# Patient Record
Sex: Male | Born: 1970 | Race: White | Hispanic: No | Marital: Single | Smoking: Current some day smoker
Health system: Southern US, Community
[De-identification: ages and names within clinical notes are randomized; demographics above are authoritative.]

## PROBLEM LIST (undated history)

## (undated) DIAGNOSIS — R569 Unspecified convulsions: Secondary | ICD-10-CM

## (undated) HISTORY — PX: SKIN GRAFT: SHX250

---

## 2004-10-24 ENCOUNTER — Emergency Department: Payer: Self-pay | Admitting: Emergency Medicine

## 2009-11-11 ENCOUNTER — Emergency Department: Payer: Self-pay | Admitting: Emergency Medicine

## 2011-03-29 ENCOUNTER — Emergency Department: Payer: Self-pay | Admitting: Emergency Medicine

## 2011-09-22 ENCOUNTER — Emergency Department: Payer: Self-pay | Admitting: *Deleted

## 2015-10-27 ENCOUNTER — Emergency Department
Admission: EM | Admit: 2015-10-27 | Discharge: 2015-10-28 | Disposition: A | Discharge status: Home / Self Care | Payer: Managed Care, Other (non HMO) | Attending: Emergency Medicine | Admitting: Emergency Medicine

## 2015-10-27 DIAGNOSIS — Y9389 Activity, other specified: Secondary | ICD-10-CM | POA: Diagnosis not present

## 2015-10-27 DIAGNOSIS — R000 Tachycardia, unspecified: Secondary | ICD-10-CM | POA: Diagnosis not present

## 2015-10-27 DIAGNOSIS — Z79899 Other long term (current) drug therapy: Secondary | ICD-10-CM | POA: Insufficient documentation

## 2015-10-27 DIAGNOSIS — R569 Unspecified convulsions: Secondary | ICD-10-CM

## 2015-10-27 DIAGNOSIS — S60812A Abrasion of left wrist, initial encounter: Secondary | ICD-10-CM | POA: Insufficient documentation

## 2015-10-27 DIAGNOSIS — X58XXXA Exposure to other specified factors, initial encounter: Secondary | ICD-10-CM | POA: Insufficient documentation

## 2015-10-27 DIAGNOSIS — G40909 Epilepsy, unspecified, not intractable, without status epilepticus: Secondary | ICD-10-CM | POA: Diagnosis not present

## 2015-10-27 DIAGNOSIS — Y9289 Other specified places as the place of occurrence of the external cause: Secondary | ICD-10-CM | POA: Insufficient documentation

## 2015-10-27 DIAGNOSIS — Y990 Civilian activity done for income or pay: Secondary | ICD-10-CM | POA: Insufficient documentation

## 2015-10-27 HISTORY — DX: Unspecified convulsions: R56.9

## 2015-10-27 LAB — CBC
HEMATOCRIT: 56.8 % — AB (ref 40.0–52.0)
HEMOGLOBIN: 18.9 g/dL — AB (ref 13.0–18.0)
MCH: 33.2 pg (ref 26.0–34.0)
MCHC: 33.2 g/dL (ref 32.0–36.0)
MCV: 99.7 fL (ref 80.0–100.0)
Platelets: 445 10*3/uL — ABNORMAL HIGH (ref 150–440)
RBC: 5.69 MIL/uL (ref 4.40–5.90)
RDW: 16.4 % — ABNORMAL HIGH (ref 11.5–14.5)
WBC: 9.5 10*3/uL (ref 3.8–10.6)

## 2015-10-27 MED ORDER — SODIUM CHLORIDE 0.9 % IV BOLUS (SEPSIS)
1000.0000 mL | Freq: Once | INTRAVENOUS | Status: AC
  Administered 2015-10-27: 1000 mL via INTRAVENOUS

## 2015-10-27 MED ORDER — SODIUM CHLORIDE 0.9 % IV BOLUS (SEPSIS)
1000.0000 mL | INTRAVENOUS | Status: AC
  Administered 2015-10-27: 1000 mL via INTRAVENOUS

## 2015-10-27 NOTE — ED Notes (Addendum)
Pt reports hx of seizures. Pt currently takes Lamictal and Depakote for seizures. Pt took his med's this am. Pt had witnessed seizure at work at approx 2140. Pt did not hit floor, but has bruising to left wrist. EMS reports he may have hit wrist on desk during seizure. Pt reports having seizure 2 weeks ago. Pt did not have med's adjusted after last seizure. Pt denies pain, weakness, lightheadedness, dizziness. Pt is A&O X 4. Pt is currently in ST at 141, with BP of 183/107

## 2015-10-27 NOTE — ED Provider Notes (Signed)
[facility] Emergency Department Provider Note  ____________________________________________  Time seen: Approximately 11:28 PM  I have reviewed the triage vital signs and the nursing notes.   HISTORY  Chief Complaint Seizures    HPI Lance Ray is a 45 y.o. male with a known seizure disorder who is followed by [facility] neurology who presents by EMS after a witnessed seizure while he was at work.  He works as a Industrial/product designer and reports that he was at work and the next thing he knew he was on the ambulance.  Bystanders report that he had a generalized tonic-clonic seizure.  The duration is unknown but he stopped seizing on his own.  He struck his left wrist somehow during the seizure but he did not fall, did not strike his head, did not sustain any other injuries.  Her port is that he was initially confused afterwards but he is back to his baseline at this time.  When he first came in his heart rate was approximately 150 but after 1 L of fluids it is dropped down to the 110s.  The patient denies any numbness or weakness in any of his extremities and states that he feels fine.  He has not had any recent illnesses.  He denies headache, visual changes, neck pain, nausea, vomiting, diarrhea, abdominal pain, chest pain, shortness of breath.  He has not had any recent viral symptoms specifically excluding fever/chills.  He has not had any medication changes recently and takes both Lamictal and Depakote.  He states that he has been sleeping adequately, has not been drinking alcohol, has not been using any illegal drugs, and has been compliant with his medications.  He also states that he feels he has been eating and drinking adequately and does not feel dehydrated.   Past Medical History  Diagnosis Date  . Seizures (HCC)     There are no active problems to display for this patient.   History reviewed. No pertinent past surgical history.  Current Outpatient Rx  Name   Route  Sig  Dispense  Refill  . divalproex (DEPAKOTE ER) 500 MG 24 hr tablet   Oral   Take 500 mg by mouth daily.         [name] lamoTRIgine (LAMICTAL) 200 MG tablet   Oral   Take 200 mg by mouth 2 (two) times daily.           Allergies Review of patient's allergies indicates no known allergies.  Family History  Problem Relation Age of Onset  . Seizures Mother     Social History Social History  Substance Use Topics  . Smoking status: Never Smoker   . Smokeless tobacco: None  . Alcohol Use: None    Review of Systems Constitutional: No fever/chills Eyes: No visual changes. ENT: No sore throat. Cardiovascular: Denies chest pain. Respiratory: Denies shortness of breath. Gastrointestinal: No abdominal pain.  No nausea, no vomiting.  No diarrhea.  No constipation. Genitourinary: Negative for dysuria. Musculoskeletal: Negative for back pain. Skin: Negative for rash. Neurological: Negative for headaches, focal weakness or numbness.  Generalized tonic-clonic seizure at work just prior to arrival  10-point ROS otherwise negative.  ____________________________________________   PHYSICAL EXAM:  VITAL SIGNS: ED Triage Vitals  Enc Vitals Group     BP 10/27/15 2245 159/91 mmHg     Pulse Rate 10/27/15 2245 137     Resp 10/27/15 2245 17     Temp 10/27/15 2245 98 F (36.7 C)  Temp Source 10/27/15 2245 Oral     SpO2 10/27/15 2235 96 %     Weight 10/27/15 2245 150 lb (68.04 kg)     Height 10/27/15 2245  (1.753 m)     Head Cir --      Peak Flow --      Pain Score --      Pain Loc --      Pain Edu? --      Excl. in GC? --     Constitutional: Alert and oriented. Well appearing and in no acute distress. Eyes: Conjunctivae are normal. PERRL. EOMI. Head: Atraumatic. Nose: No congestion/rhinnorhea. Mouth/Throat: Mucous membranes are moist.  Oropharynx non-erythematous. Neck: No stridor.  No cervical spine tenderness to palpation. Cardiovascular: Tachycardia,  regular rhythm. Grossly normal heart sounds.  Good peripheral circulation. Respiratory: Normal respiratory effort.  No retractions. Lungs CTAB. Gastrointestinal: Soft and nontender. No distention. No abdominal bruits. No CVA tenderness. Musculoskeletal: No lower extremity tenderness nor edema.  No joint effusions.  Minimal tenderness to palpation on the dorsal aspect of the left wrist with some superficial abrasions but no swelling or deformity.  Normal range of motion.  No bony tenderness to palpation. Neurologic:  Normal speech and language. No gross focal neurologic deficits are appreciated.  Skin:  Skin is warm, dry and intact. No rash noted.  Abrasions on the dorsal aspect of the left wrist as described above Psychiatric: Mood and affect are normal. Speech and behavior are normal.  ____________________________________________   LABS (all labs ordered are listed, but only abnormal results are displayed)  Labs Reviewed  BASIC METABOLIC PANEL - Abnormal; Notable for the following:    Potassium 3.0 (*)    CO2 17 (*)    Glucose, Bld 146 (*)    Anion gap 22 (*)    All other components within normal limits  VALPROIC ACID LEVEL - Abnormal; Notable for the following:    Valproic Acid Lvl 103 (*)    All other components within normal limits  CBC - Abnormal; Notable for the following:    Hemoglobin 18.9 (*)    HCT 56.8 (*)    RDW 16.4 (*)    Platelets 445 (*)    All other components within normal limits  URINALYSIS COMPLETEWITH MICROSCOPIC ([facility] ONLY) - Abnormal; Notable for the following:    Color, Urine YELLOW (*)    APPearance CLEAR (*)    Protein, ur 100 (*)    Bacteria, UA MANY (*)    All other components within normal limits  URINE CULTURE  MAGNESIUM   ____________________________________________  EKG  ED ECG REPORT I, Tela Kotecki, the attending physician, personally viewed and interpreted this ECG.  Date: 10/27/2015 EKG Time: 22:38 Rate: 149 Rhythm: Sinus  tachycardia QRS Axis: normal Intervals: normal ST/T Wave abnormalities: Non-specific ST segment / T-wave changes, but no evidence of acute ischemia. Conduction Disturbances: none Narrative Interpretation: unremarkable  ____________________________________________  RADIOLOGY   No results found.  ____________________________________________   PROCEDURES  Procedure(s) performed: None  Critical Care performed: No ____________________________________________   INITIAL IMPRESSION / ASSESSMENT AND PLAN / ED COURSE  Pertinent labs & imaging results that were available during my care of the patient were reviewed by me and considered in my medical decision making (see chart for details).  The patient has a known seizure disorder with no focal neurological findings and no headache.  Imaging is unlikely to be illustrative.  I will check blood work for gross abnormalities including electrolyte imbalance, we  will check urinalysis for possible infection, and we will check a Depakote level.  Do not believe a chest x-ray would be helpful given that he has no respiratory symptoms and a normal exam on auscultation.  After 1 L of fluids his heart rate dropped from the upper 140s down to the 110s.  I will give a second liter bolus and continue to monitor.  The patient understands and agrees with this plan.  ----------------------------------------- 4:03 AM on 10/28/2015 (Note that documentation was delayed due to multiple ED patients requiring immediate care.) -----------------------------------------  The patient's workup was unremarkable.  He was somewhat hemoconcentrated which is also consistent with his initial tachycardia and normal heart rate after 2 L of fluid.  He insisted on being discharged prior to his urinalysis results, which showed many bacteria but no WBCs.  I have added on a urine culture so that if he needs antibiotics he will be contacted.  Called and spoke by phone with Dr. Ephriam Jenkins  (oncall neurologist at [facility]) and we discussed the case.  She recommended that I increase the patient's Depakote to 1000 mg daily, but that was before we got his Depakote level back which actually showed a slightly supratherapeutic level of 103.  I had given him an extra dose as per her recommendation in the emergency department, but I advised him to not continue taking the increased dose because his regular neurologist, Dr. Quintin Alto, we will contact him within the next 24 hours to schedule a follow-up appointment, and since the level as already supratherapeutic I do not want to further increase it.  I gave him my usual and customary management advice and return precautions, and he understands and agrees with the plan.   ____________________________________________  FINAL CLINICAL IMPRESSION(S) / ED DIAGNOSES  Final diagnoses:  Seizure [facility])      NEW MEDICATIONS STARTED DURING THIS VISIT:  Discharge Medication List as of 10/28/2015  1:21 AM       Loleta Rose, MD 10/28/15 1191

## 2015-10-28 LAB — URINALYSIS COMPLETE WITH MICROSCOPIC (ARMC ONLY)
Bilirubin Urine: NEGATIVE
GLUCOSE, UA: NEGATIVE mg/dL
Hgb urine dipstick: NEGATIVE
Ketones, ur: NEGATIVE mg/dL
Leukocytes, UA: NEGATIVE
Nitrite: NEGATIVE
PROTEIN: 100 mg/dL — AB
SQUAMOUS EPITHELIAL / LPF: NONE SEEN
Specific Gravity, Urine: 1.015 (ref 1.005–1.030)
pH: 6 (ref 5.0–8.0)

## 2015-10-28 LAB — VALPROIC ACID LEVEL: VALPROIC ACID LVL: 103 ug/mL — AB (ref 50.0–100.0)

## 2015-10-28 LAB — BASIC METABOLIC PANEL
Anion gap: 22 — ABNORMAL HIGH (ref 5–15)
BUN: 8 mg/dL (ref 6–20)
CALCIUM: 9.8 mg/dL (ref 8.9–10.3)
CO2: 17 mmol/L — ABNORMAL LOW (ref 22–32)
CREATININE: 1.11 mg/dL (ref 0.61–1.24)
Chloride: 101 mmol/L (ref 101–111)
GFR calc Af Amer: >60 mL/min (ref >60)
GLUCOSE: 146 mg/dL — AB (ref 65–99)
Potassium: 3 mmol/L — ABNORMAL LOW (ref 3.5–5.1)
SODIUM: 140 mmol/L (ref 135–145)

## 2015-10-28 LAB — MAGNESIUM: MAGNESIUM: 1.9 mg/dL (ref 1.7–2.4)

## 2015-10-28 MED ORDER — POTASSIUM CHLORIDE CRYS ER 20 MEQ PO TBCR
40.0000 meq | EXTENDED_RELEASE_TABLET | Freq: Once | ORAL | Status: AC
  Administered 2015-10-28: 40 meq via ORAL
  Filled 2015-10-28: qty 2

## 2015-10-28 MED ORDER — DIVALPROEX SODIUM ER 500 MG PO TB24
500.0000 mg | ORAL_TABLET | ORAL | Status: AC
  Administered 2015-10-28: 500 mg via ORAL
  Filled 2015-10-28: qty 1

## 2015-10-28 NOTE — ED Notes (Signed)
MD aware of Depakote level. MD ordered Depakote given.

## 2015-10-28 NOTE — ED Notes (Signed)
Reviewed d/c instructions, follow-up care, and that the patient should not alter his normal medications or drive until he speaks to his PCP/neurologist per MD York Cerise. Pt verbalized understanding.

## 2015-10-28 NOTE — ED Notes (Signed)
Patient requesting to be discharged.  MD notified 

## 2015-10-28 NOTE — Discharge Instructions (Signed)
You have been seen in the emergency department today for a seizure.  Your workup today including labs are within normal limits.  Please follow up with your doctor/neurologist as soon as possible regarding today's emergency department visit and your likely seizure.  As we have discussed it is very important that you do not drive until you have been seen and cleared by your neurologist.  Originally we discussed increasing your Depakote, but your level today was 103 (upper limit of normal is 100), so we do not recommend you increase your medications at this time.  Dr. Elroy Channel office will contact you within the next 24 hours for a follow-up appointment, and he can discuss appropriate medication changes with you at that time.  Please drink plenty of fluids, get plenty of sleep and avoid any alcohol or drug use Please return to the emergency department if you have any further seizures which do not respond to medications, or for any other symptoms per se concerning for yourself.   Epilepsy Epilepsy is a disorder in which a person has repeated seizures over time. A seizure is a release of abnormal electrical activity in the brain. Seizures can cause a change in attention, behavior, or the ability to remain awake and alert (altered mental status). Seizures often involve uncontrollable shaking (convulsions).  Most people with epilepsy lead normal lives. However, people with epilepsy are at an increased risk of falls, accidents, and injuries. Therefore, it is important to begin treatment right away. CAUSES  Epilepsy has many possible causes. Anything that disturbs the normal pattern of brain cell activity can lead to seizures. This may include:   Head injury.  Birth trauma.  High fever as a child.  Stroke.  Bleeding into or around the brain.  Certain drugs.  Prolonged low oxygen, such as what occurs after CPR efforts.  Abnormal brain development.  Certain illnesses, such as meningitis,  encephalitis (brain infection), malaria, and other infections.  An imbalance of nerve signaling chemicals (neurotransmitters).  SIGNS AND SYMPTOMS  The symptoms of a seizure can vary greatly from one person to another. Right before a seizure, you may have a warning (aura) that a seizure is about to occur. An aura may include the following symptoms:  Fear or anxiety.  Nausea.  Feeling like the room is spinning (vertigo).  Vision changes, such as seeing flashing lights or spots. Common symptoms during a seizure include:  Abnormal sensations, such as an abnormal smell or a bitter taste in the mouth.   Sudden, general body stiffness.   Convulsions that involve rhythmic jerking of the face, arm, or leg on one or both sides.   Sudden change in consciousness.   Appearing to be awake but not responding.   Appearing to be asleep but cannot be awakened.   Grimacing, chewing, lip smacking, drooling, tongue biting, or loss of bowel or bladder control. After a seizure, you may feel sleepy for a while. DIAGNOSIS  Your health care provider will ask about your symptoms and take a medical history. Descriptions from any witnesses to your seizures will be very helpful in the diagnosis. A physical exam, including a detailed neurological exam, is necessary. Various tests may be done, such as:   An electroencephalogram (EEG). This is a painless test of your brain waves. In this test, a diagram is created of your brain waves. These diagrams can be interpreted by a specialist.  An MRI of the brain.   A CT scan of the brain.   A spinal  tap (lumbar puncture, LP).  Blood tests to check for signs of infection or abnormal blood chemistry. TREATMENT  There is no cure for epilepsy, but it is generally treatable. Once epilepsy is diagnosed, it is important to begin treatment as soon as possible. For most people with epilepsy, seizures can be controlled with medicines. The following may also be  used:  A pacemaker for the brain (vagus nerve stimulator) can be used for people with seizures that are not well controlled by medicine.  Surgery on the brain. For some people, epilepsy eventually goes away. HOME CARE INSTRUCTIONS   Follow your health care provider's recommendations on driving and safety in normal activities.  Get enough rest. Lack of sleep can cause seizures.  Only take over-the-counter or prescription medicines as directed by your health care provider. Take any prescribed medicine exactly as directed.  Avoid any known triggers of your seizures.  Keep a seizure diary. Record what you recall about any seizure, especially any possible trigger.   Make sure the people you live and work with know that you are prone to seizures. They should receive instructions on how to help you. In general, a witness to a seizure should:   Cushion your head and body.   Turn you on your side.   Avoid unnecessarily restraining you.   Not place anything inside your mouth.   Call for emergency medical help if there is any question about what has occurred.   Follow up with your health care provider as directed. You may need regular blood tests to monitor the levels of your medicine.  SEEK MEDICAL CARE IF:   You develop signs of infection or other illness. This might increase the risk of a seizure.   You seem to be having more frequent seizures.   Your seizure pattern is changing.  SEEK IMMEDIATE MEDICAL CARE IF:   You have a seizure that does not stop after a few moments.   You have a seizure that causes any difficulty in breathing.   You have a seizure that results in a very severe headache.   You have a seizure that leaves you with the inability to speak or use a part of your body.    This information is not intended to replace advice given to you by your health care provider. Make sure you discuss any questions you have with your health care provider.     Document Released: 09/11/2005 Document Revised: 07/02/2013 Document Reviewed: 04/23/2013 Elsevier Interactive Patient Education [employer]

## 2015-10-28 NOTE — ED Notes (Signed)
MD Forbach at bedside. 

## 2015-10-30 LAB — URINE CULTURE
CULTURE: NO GROWTH
SPECIAL REQUESTS: NORMAL

## 2016-05-27 ENCOUNTER — Encounter: Payer: Self-pay | Admitting: Emergency Medicine

## 2016-05-27 ENCOUNTER — Emergency Department: Payer: Managed Care, Other (non HMO)

## 2016-05-27 ENCOUNTER — Emergency Department
Admission: EM | Admit: 2016-05-27 | Discharge: 2016-05-27 | Discharge status: Against Medical Advice | Payer: Managed Care, Other (non HMO) | Attending: Emergency Medicine | Admitting: Emergency Medicine

## 2016-05-27 DIAGNOSIS — S0093XA Contusion of unspecified part of head, initial encounter: Secondary | ICD-10-CM | POA: Insufficient documentation

## 2016-05-27 DIAGNOSIS — Y999 Unspecified external cause status: Secondary | ICD-10-CM | POA: Diagnosis not present

## 2016-05-27 DIAGNOSIS — G40909 Epilepsy, unspecified, not intractable, without status epilepticus: Secondary | ICD-10-CM | POA: Insufficient documentation

## 2016-05-27 DIAGNOSIS — Z79899 Other long term (current) drug therapy: Secondary | ICD-10-CM | POA: Insufficient documentation

## 2016-05-27 DIAGNOSIS — Y9389 Activity, other specified: Secondary | ICD-10-CM | POA: Insufficient documentation

## 2016-05-27 DIAGNOSIS — F17290 Nicotine dependence, other tobacco product, uncomplicated: Secondary | ICD-10-CM | POA: Insufficient documentation

## 2016-05-27 DIAGNOSIS — W1800XA Striking against unspecified object with subsequent fall, initial encounter: Secondary | ICD-10-CM | POA: Insufficient documentation

## 2016-05-27 DIAGNOSIS — Y929 Unspecified place or not applicable: Secondary | ICD-10-CM | POA: Insufficient documentation

## 2016-05-27 DIAGNOSIS — R569 Unspecified convulsions: Secondary | ICD-10-CM

## 2016-05-27 LAB — URINE DRUG SCREEN, QUALITATIVE (ARMC ONLY)
Amphetamines, Ur Screen: NOT DETECTED
BARBITURATES, UR SCREEN: NOT DETECTED
BENZODIAZEPINE, UR SCRN: NOT DETECTED
CANNABINOID 50 NG, UR ~~LOC~~: NOT DETECTED
Cocaine Metabolite,Ur ~~LOC~~: NOT DETECTED
MDMA (Ecstasy)Ur Screen: NOT DETECTED
Methadone Scn, Ur: NOT DETECTED
Opiate, Ur Screen: NOT DETECTED
PHENCYCLIDINE (PCP) UR S: NOT DETECTED
Tricyclic, Ur Screen: NOT DETECTED

## 2016-05-27 LAB — URINALYSIS COMPLETE WITH MICROSCOPIC (ARMC ONLY)
Bilirubin Urine: NEGATIVE
Glucose, UA: NEGATIVE mg/dL
HGB URINE DIPSTICK: NEGATIVE
LEUKOCYTES UA: NEGATIVE
Nitrite: NEGATIVE
PH: 5 (ref 5.0–8.0)
PROTEIN: 100 mg/dL — AB
SPECIFIC GRAVITY, URINE: 1.02 (ref 1.005–1.030)
Squamous Epithelial / LPF: NONE SEEN

## 2016-05-27 LAB — BASIC METABOLIC PANEL
Anion gap: 18 — ABNORMAL HIGH (ref 5–15)
BUN: 12 mg/dL (ref 6–20)
CHLORIDE: 106 mmol/L (ref 101–111)
CO2: 17 mmol/L — ABNORMAL LOW (ref 22–32)
Calcium: 9.1 mg/dL (ref 8.9–10.3)
Creatinine, Ser: 1.05 mg/dL (ref 0.61–1.24)
GFR calc Af Amer: >60 mL/min (ref >60)
GFR calc non Af Amer: >60 mL/min (ref >60)
Glucose, Bld: 116 mg/dL — ABNORMAL HIGH (ref 65–99)
POTASSIUM: 4.2 mmol/L (ref 3.5–5.1)
SODIUM: 141 mmol/L (ref 135–145)

## 2016-05-27 LAB — CBC
HEMATOCRIT: 60.4 % — AB (ref 40.0–52.0)
Hemoglobin: 20.3 g/dL — ABNORMAL HIGH (ref 13.0–18.0)
MCH: 34.4 pg — AB (ref 26.0–34.0)
MCHC: 33.6 g/dL (ref 32.0–36.0)
MCV: 102.4 fL — AB (ref 80.0–100.0)
Platelets: 420 10*3/uL (ref 150–440)
RBC: 5.89 MIL/uL (ref 4.40–5.90)
RDW: 15.6 % — ABNORMAL HIGH (ref 11.5–14.5)
WBC: 11.2 10*3/uL — AB (ref 3.8–10.6)

## 2016-05-27 LAB — GLUCOSE, CAPILLARY: GLUCOSE-CAPILLARY: 150 mg/dL — AB (ref 65–99)

## 2016-05-27 LAB — VALPROIC ACID LEVEL: VALPROIC ACID LVL: 13 ug/mL — AB (ref 50.0–100.0)

## 2016-05-27 LAB — ETHANOL

## 2016-05-27 MED ORDER — VALPROATE SODIUM 500 MG/5ML IV SOLN
500.0000 mg | Freq: Once | INTRAVENOUS | Status: AC
  Administered 2016-05-27: 500 mg via INTRAVENOUS
  Filled 2016-05-27: qty 5

## 2016-05-27 MED ORDER — SODIUM CHLORIDE 0.9 % IV BOLUS (SEPSIS)
1000.0000 mL | Freq: Once | INTRAVENOUS | Status: AC
  Administered 2016-05-27: 1000 mL via INTRAVENOUS

## 2016-05-27 MED ORDER — SODIUM CHLORIDE 0.9 % IV BOLUS (SEPSIS): 500.0000 mL | Freq: Once | INTRAVENOUS | Status: DC

## 2016-05-27 NOTE — ED Provider Notes (Signed)
[facility] Emergency Department Provider Note   ____________________________________________   First MD Initiated Contact with Patient 05/27/16 0123     (approximate)  I have reviewed the triage vital signs and the nursing notes.   HISTORY  Chief Complaint Seizures    HPI Lance Ray is a 45 y.o. male who comes into the hospital today with a seizure. The patient reports that he had a seizure while at work. He does not remember anything of what happened and he does not know how long it lasted. He reports that he was told that he hit his head on the right. The patient reports that he has been taking his medication without any difficulties. He denies any loss of sleep, coughing, fevers, runny nose. The patient does have a history of seizures and his last seizure was a couple of months ago. He has a neurologist at [facility] but that person was last seen approximately 6 months ago. He reports that he has seizures a couple times a year typically. He denies any chest pain denies any shortness of breath denies any nausea, vomiting, abdominal pain. He does have a mild headache. The patient was brought in for evaluation of his seizure.   Past Medical History:  Diagnosis Date  . Seizures (HCC)     There are no active problems to display for this patient.   History reviewed. No pertinent surgical history.  Prior to Admission medications   Medication Sig Start Date End Date Taking? Authorizing Provider  divalproex (DEPAKOTE ER) 500 MG 24 hr tablet Take 500 mg by mouth daily.   Yes Historical Provider, MD  lamoTRIgine (LAMICTAL) 200 MG tablet Take 200 mg by mouth 2 (two) times daily.   Yes Historical Provider, MD    Allergies Review of patient's allergies indicates no known allergies.  Family History  Problem Relation Age of Onset  . Seizures Mother     Social History Social History  Substance Use Topics  . Smoking status: Never Smoker  .  Smokeless tobacco: Current User  . Alcohol use 1.2 oz/week    2 Shots of liquor per week    Review of Systems Constitutional: No fever/chills Eyes: No visual changes. ENT: No sore throat. Cardiovascular: Denies chest pain. Respiratory: Denies shortness of breath. Gastrointestinal: No abdominal pain.  No nausea, no vomiting.  No diarrhea.  No constipation. Genitourinary: Negative for dysuria. Musculoskeletal: Negative for back pain. Skin: Negative for rash. Neurological: Seizure and headache  10-point ROS otherwise negative.  ____________________________________________   PHYSICAL EXAM:  VITAL SIGNS: ED Triage Vitals  Enc Vitals Group     BP --      Pulse Rate 05/27/16 0100 (!) 147     Resp 05/27/16 0100 19     Temp 05/27/16 0100 98.4 F (36.9 C)     Temp Source 05/27/16 0100 Oral     SpO2 05/27/16 0100 97 %     Weight 05/27/16 0101 167 lb (75.8 kg)     Height 05/27/16 0101 5\' 9"  (1.753 m)     Head Circumference --      Peak Flow --      Pain Score --      Pain Loc --      Pain Edu? --      Excl. in GC? --     Constitutional: Alert and oriented. Well appearing and in no acute distress. Eyes: Conjunctivae are normal. PERRL. EOMI. Head: Contusion to right head Nose: No congestion/rhinnorhea.  Mouth/Throat: Mucous membranes are moist.  Oropharynx non-erythematous. Cardiovascular: Tachycardia regular rhythm. Grossly normal heart sounds.  Good peripheral circulation. Respiratory: Normal respiratory effort.  No retractions. Lungs CTAB. Gastrointestinal: Soft and nontender. No distention. Positive bowel sounds Musculoskeletal: No lower extremity tenderness nor edema.   Neurologic:  Normal speech and language. Cranial nerves II through XII are grossly intact with no focal motor or neuro deficits Skin:  Skin is warm, dry and intact.  Psychiatric: Mood and affect are normal.   ____________________________________________   LABS (all labs ordered are listed, but only  abnormal results are displayed)  Labs Reviewed  VALPROIC ACID LEVEL - Abnormal; Notable for the following:       Result Value   Valproic Acid Lvl 13 (*)    All other components within normal limits  GLUCOSE, CAPILLARY - Abnormal; Notable for the following:    Glucose-Capillary 150 (*)    All other components within normal limits  CBC - Abnormal; Notable for the following:    WBC 11.2 (*)    Hemoglobin 20.3 (*)    HCT 60.4 (*)    MCV 102.4 (*)    MCH 34.4 (*)    RDW 15.6 (*)    All other components within normal limits  BASIC METABOLIC PANEL - Abnormal; Notable for the following:    CO2 17 (*)    Glucose, Bld 116 (*)    Anion gap 18 (*)    All other components within normal limits  URINALYSIS COMPLETEWITH MICROSCOPIC ([facility] ONLY) - Abnormal; Notable for the following:    Color, Urine YELLOW (*)    APPearance CLEAR (*)    Ketones, ur TRACE (*)    Protein, ur 100 (*)    Bacteria, UA RARE (*)    All other components within normal limits  URINE DRUG SCREEN, QUALITATIVE ([facility] ONLY)  ETHANOL  CBG MONITORING, ED   ____________________________________________  EKG  ED ECG REPORT I, Rebecka Apley, the attending physician, personally viewed and interpreted this ECG.   Date: 05/27/2016  EKG Time: 1021  Rate: 147  Rhythm: sinus tachycardia  Axis: Normal  Intervals:none  ST&T Change: none  ____________________________________________  RADIOLOGY  CT head and cervical spine ____________________________________________   PROCEDURES  Procedure(s) performed: None  Procedures  Critical Care performed: No  ____________________________________________   INITIAL IMPRESSION / ASSESSMENT AND PLAN / ED COURSE  Pertinent labs & imaging results that were available during my care of the patient were reviewed by me and considered in my medical decision making (see chart for details).  This is a 45 year old male with a history of seizures who comes in today with a  seizure at work. The patient cannot tell me much about the seizure but he did hit his head. I will check some blood work as well as give the patient a liter of normal saline. The patient is tachycardic here in the emergency department. I will reassess the patient once I received the results of his blood work. I will also do a CT of the patient's head and cervical spine given his injury. He will be reassessed.  Clinical Course  Value Comment By Time  CT Head Wo Contrast 1. No acute intracranial process. 2. 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   Rebecka Apley, MD 09/02 0543  CT Cervical Spine Wo Contrast 1. No acute traumatic injury identified within the cervical spine. 2. Reversal of the normal cervical lordosis, which may be related to positioning and/or muscular spasm.  3. 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   Rebecka ApleyAllison P Webster, MD 09/02 0543    It appears that the patient has some hemoconcentration and a low Depakote level. I did give the patient dose of Depakote 500 mg IV and I did give the patient 2 L of normal saline. His tachycardia improved from 140s to 105. I did plan to give the patient a 500 mL bolus but the patient reports he no longer wants to stay. He reports that he just wants to go home at this point. The remainder of the patient's blood work is unremarkable and he has not had any seizures here. I informed the patient that given his continued tachycardia without resolution I'll have to sign him out AGAINST MEDICAL ADVICE. The patient did understand and agree to that. He did also ask for a note saying that he was here for work. I will give the patient a note for work and he'll be discharged home. ____________________________________________   FINAL CLINICAL IMPRESSION(S) / ED DIAGNOSES  Final diagnoses:  Seizure (HCC)      NEW MEDICATIONS STARTED DURING THIS VISIT:  New Prescriptions   No medications on file      Note:  This document was prepared using Dragon voice recognition software and may include unintentional dictation errors.    Rebecka ApleyAllison P Webster, MD 05/27/16 [phone]

## 2016-05-27 NOTE — ED Notes (Signed)
Pt states unable to give urine sample at this time, pt given urinal and asked to give sample when he is able

## 2016-05-27 NOTE — ED Triage Notes (Signed)
Per EMS patient had witnessed seizure at work and slight confusion after the episode was over.  Patient presents to ER in NAD and AOx4.  Pt has hx of seizure and takes Depakote and Namictal.  Last seizure was a couple of months ago.

## 2016-05-27 NOTE — ED Notes (Signed)
Patient at CT

## 2016-07-12 ENCOUNTER — Emergency Department
Admission: EM | Admit: 2016-07-12 | Discharge: 2016-07-13 | Disposition: A | Discharge status: Home / Self Care | Payer: Managed Care, Other (non HMO) | Attending: Emergency Medicine | Admitting: Emergency Medicine

## 2016-07-12 DIAGNOSIS — Z79899 Other long term (current) drug therapy: Secondary | ICD-10-CM | POA: Diagnosis not present

## 2016-07-12 DIAGNOSIS — R569 Unspecified convulsions: Secondary | ICD-10-CM

## 2016-07-12 DIAGNOSIS — F17200 Nicotine dependence, unspecified, uncomplicated: Secondary | ICD-10-CM | POA: Diagnosis not present

## 2016-07-12 DIAGNOSIS — G40909 Epilepsy, unspecified, not intractable, without status epilepticus: Secondary | ICD-10-CM | POA: Insufficient documentation

## 2016-07-12 LAB — COMPREHENSIVE METABOLIC PANEL
ALBUMIN: 4.2 g/dL (ref 3.5–5.0)
ALT: 108 U/L — ABNORMAL HIGH (ref 17–63)
ANION GAP: 10 (ref 5–15)
AST: 103 U/L — AB (ref 15–41)
Alkaline Phosphatase: 110 U/L (ref 38–126)
BUN: 12 mg/dL (ref 6–20)
CHLORIDE: 104 mmol/L (ref 101–111)
CO2: 22 mmol/L (ref 22–32)
Calcium: 8.8 mg/dL — ABNORMAL LOW (ref 8.9–10.3)
Creatinine, Ser: 0.76 mg/dL (ref 0.61–1.24)
GFR calc Af Amer: >60 mL/min (ref >60)
GFR calc non Af Amer: >60 mL/min (ref >60)
GLUCOSE: 111 mg/dL — AB (ref 65–99)
POTASSIUM: 3.8 mmol/L (ref 3.5–5.1)
SODIUM: 136 mmol/L (ref 135–145)
TOTAL PROTEIN: 7 g/dL (ref 6.5–8.1)
Total Bilirubin: 1.2 mg/dL (ref 0.3–1.2)

## 2016-07-12 LAB — CBC
HCT: 51.6 % (ref 40.0–52.0)
Hemoglobin: 17.6 g/dL (ref 13.0–18.0)
MCH: 33.1 pg (ref 26.0–34.0)
MCHC: 34.1 g/dL (ref 32.0–36.0)
MCV: 97.1 fL (ref 80.0–100.0)
PLATELETS: 290 10*3/uL (ref 150–440)
RBC: 5.31 MIL/uL (ref 4.40–5.90)
RDW: 14.7 % — AB (ref 11.5–14.5)
WBC: 10.9 10*3/uL — AB (ref 3.8–10.6)

## 2016-07-12 MED ORDER — DIVALPROEX SODIUM 500 MG PO DR TAB
500.0000 mg | DELAYED_RELEASE_TABLET | Freq: Once | ORAL | Status: AC
  Administered 2016-07-12: 500 mg via ORAL
  Filled 2016-07-12: qty 1

## 2016-07-12 MED ORDER — SODIUM CHLORIDE 0.9 % IV BOLUS (SEPSIS)
1000.0000 mL | Freq: Once | INTRAVENOUS | Status: AC
  Administered 2016-07-12: 1000 mL via INTRAVENOUS

## 2016-07-12 MED ORDER — LAMOTRIGINE 100 MG PO TABS
200.0000 mg | ORAL_TABLET | Freq: Once | ORAL | Status: AC
  Administered 2016-07-12: 200 mg via ORAL
  Filled 2016-07-12: qty 2

## 2016-07-12 NOTE — Discharge Instructions (Signed)
Please fill your prescriptions provided by your doctor soon as possible. Obtain plenty of rest. Return to the emergency department for any personally concerning symptoms.

## 2016-07-12 NOTE — ED Provider Notes (Signed)
[facility] Emergency Department Provider Note  Time seen: 11:02 PM  I have reviewed the triage vital signs and the nursing notes.   HISTORY  Chief Complaint Seizures    HPI Lance Ray is a 44 y.o. male with a past medical history of a seizure disorder who presents the emergency department for a seizure. According to the patient he was at work Thing he remembers is waking up in the back of the ambulance. Patient states a seizure disorder, is prescribed Depakote Lamictal but ran out of these medications 4 days ago. States he has refills at the pharmacy but is just not been able to get to the pharmacy. Denies any recent alcohol use. Denies drug use. Patient states he feels a little tired and hungry but otherwise feels normal currently with no complaints. Denies any headache, focal weakness or numbness.  Past Medical History:  Diagnosis Date  . Seizures (HCC)     There are no active problems to display for this patient.   Past Surgical History:  Procedure Laterality Date  . SKIN GRAFT      Prior to Admission medications   Medication Sig Start Date End Date Taking? Authorizing Provider  divalproex (DEPAKOTE ER) 500 MG 24 hr tablet Take 500 mg by mouth daily.    Historical Provider, MD  lamoTRIgine (LAMICTAL) 200 MG tablet Take 200 mg by mouth 2 (two) times daily.    Historical Provider, MD    No Known Allergies  Family History  Problem Relation Age of Onset  . Seizures Mother     Social History Social History  Substance Use Topics  . Smoking status: Never Smoker  . Smokeless tobacco: Current User  . Alcohol use 1.2 oz/week    2 Shots of liquor per week    Review of Systems Constitutional: Negative for fever Cardiovascular: Negative for chest pain. Respiratory: Negative for shortness of breath. Gastrointestinal: Negative for abdominal pain Musculoskeletal: Negative for back pain. Neurological: Negative for headache 10-point ROS  otherwise negative.  ____________________________________________   PHYSICAL EXAM:  VITAL SIGNS: ED Triage Vitals  Enc Vitals Group     BP 07/12/16 2156 (!) 150/97     Pulse Rate 07/12/16 2156 (!) 141     Resp 07/12/16 2156 20     Temp 07/12/16 2156 98.2 F (36.8 C)     Temp Source 07/12/16 2156 Oral     SpO2 07/12/16 2156 94 %     Weight 07/12/16 2157 175 lb (79.4 kg)     Height 07/12/16 2157 5\' 9"  (1.753 m)     Head Circumference --      Peak Flow --      Pain Score 07/12/16 2157 0     Pain Loc --      Pain Edu? --      Excl. in GC? --     Constitutional: Alert and oriented. Well appearing and in no distress. Eyes: Normal exam ENT   Head: Normocephalic and atraumatic.   Mouth/Throat: Mucous membranes are moist. Cardiovascular: Regular rhythm, rate around 120 bpm. No murmur. Respiratory: Normal respiratory effort without tachypnea nor retractions. Breath sounds are clear Gastrointestinal: Soft and nontender. No distention. Musculoskeletal: Nontender with normal range of motion in all extremities.  Neurologic:  Normal speech and language. No gross focal neurologic deficit Skin:  Skin is warm, dry and intact.  Psychiatric: Mood and affect are normal.   ____________________________________________    EKG  EKG reviewed and interpreted, such as sinus  tachycardia 139 bpm, narrow QRS, normal axis, normal intervals, nonspecific ST changes without obvious ST elevation.  ____________________________________________     INITIAL IMPRESSION / ASSESSMENT AND PLAN / ED COURSE  Pertinent labs & imaging results that were available during my care of the patient were reviewed by me and considered in my medical decision making (see chart for details).  Patient with a known seizure disorder presents after a seizure at work. Patient states initial confusion but is now feeling much better. Patient is tachycardic from 120 bpm down from 140 upon arrival. We'll dose IV fluids,  patient is hungry we will feed the patient, check basic lab work. Dose Lamictal and Depakote in the emergency department. His lungs patient's lab work is within normal limits I anticipate discharge home. Patient states he has prescriptions ready for pickup pharmacy does not need prescriptions for his medications.  ____________________________________________   FINAL CLINICAL IMPRESSION(S) / ED DIAGNOSES  Seizure    Minna AntisKevin Kalim Kissel, MD 07/13/16 2123

## 2016-07-12 NOTE — ED Triage Notes (Signed)
Pt presents to ED via [facility] from communications. Pt had a seizure while sitting in the chair, never hit floor. Pt has hx of seizures, takes Lamictal and Depakote. Did not take medicines this morning, ran out of meds before weekend. Last seizure about 3 weeks ago. Pt was originally confused, didn't know location with EMS, but is more oriented upon arrival.

## 2016-07-13 ENCOUNTER — Telehealth: Payer: Self-pay | Admitting: Emergency Medicine

## 2016-07-13 LAB — VALPROIC ACID LEVEL

## 2016-07-13 NOTE — Telephone Encounter (Signed)
Called to ask patient about follow up plans and to give him his valproic acid level so he can give to his doctor.  Left message asking him to call me.

## 2016-07-13 NOTE — ED Provider Notes (Signed)
-----------------------------------------   12:12 AM on 07/13/2016 -----------------------------------------  Patient is feeling much better. Heart rate down to 104. He was loaded with antiepileptics in the emergency department and will pick up his prescription in the morning. Return precautions given. Patient verbalizes understanding and agrees with plan of care.   Irean HongJade J Sung, MD 07/13/16 [phone]

## 2016-07-26 ENCOUNTER — Emergency Department
Admission: EM | Admit: 2016-07-26 | Discharge: 2016-07-27 | Disposition: A | Discharge status: Home / Self Care | Payer: Managed Care, Other (non HMO) | Attending: Emergency Medicine | Admitting: Emergency Medicine

## 2016-07-26 ENCOUNTER — Encounter: Payer: Self-pay | Admitting: Emergency Medicine

## 2016-07-26 DIAGNOSIS — G40909 Epilepsy, unspecified, not intractable, without status epilepticus: Secondary | ICD-10-CM | POA: Diagnosis not present

## 2016-07-26 DIAGNOSIS — Z79899 Other long term (current) drug therapy: Secondary | ICD-10-CM | POA: Diagnosis not present

## 2016-07-26 DIAGNOSIS — F17200 Nicotine dependence, unspecified, uncomplicated: Secondary | ICD-10-CM | POA: Insufficient documentation

## 2016-07-26 DIAGNOSIS — R569 Unspecified convulsions: Secondary | ICD-10-CM

## 2016-07-26 LAB — CBC WITH DIFFERENTIAL/PLATELET
BASOS ABS: 0.1 10*3/uL (ref 0–0.1)
BASOS PCT: 1 %
EOS PCT: 1 %
Eosinophils Absolute: 0.1 10*3/uL (ref 0–0.7)
HCT: 53.6 % — ABNORMAL HIGH (ref 40.0–52.0)
Hemoglobin: 18.7 g/dL — ABNORMAL HIGH (ref 13.0–18.0)
LYMPHS PCT: 20 %
Lymphs Abs: 1.9 10*3/uL (ref 1.0–3.6)
MCH: 34.1 pg — ABNORMAL HIGH (ref 26.0–34.0)
MCHC: 34.9 g/dL (ref 32.0–36.0)
MCV: 97.5 fL (ref 80.0–100.0)
MONO ABS: 0.9 10*3/uL (ref 0.2–1.0)
Monocytes Relative: 9 %
Neutro Abs: 6.8 10*3/uL — ABNORMAL HIGH (ref 1.4–6.5)
Neutrophils Relative %: 69 %
PLATELETS: 345 10*3/uL (ref 150–440)
RBC: 5.5 MIL/uL (ref 4.40–5.90)
RDW: 15.2 % — AB (ref 11.5–14.5)
WBC: 9.7 10*3/uL (ref 3.8–10.6)

## 2016-07-26 LAB — BASIC METABOLIC PANEL
ANION GAP: 20 — AB (ref 5–15)
BUN: 7 mg/dL (ref 6–20)
CALCIUM: 8.9 mg/dL (ref 8.9–10.3)
CO2: 17 mmol/L — ABNORMAL LOW (ref 22–32)
Chloride: 103 mmol/L (ref 101–111)
Creatinine, Ser: 0.97 mg/dL (ref 0.61–1.24)
GFR calc Af Amer: >60 mL/min (ref >60)
GLUCOSE: 111 mg/dL — AB (ref 65–99)
Potassium: 3 mmol/L — ABNORMAL LOW (ref 3.5–5.1)
Sodium: 140 mmol/L (ref 135–145)

## 2016-07-26 LAB — VALPROIC ACID LEVEL: VALPROIC ACID LVL: 64 ug/mL (ref 50.0–100.0)

## 2016-07-26 MED ORDER — SODIUM CHLORIDE 0.9 % IV BOLUS (SEPSIS)
1000.0000 mL | Freq: Once | INTRAVENOUS | Status: AC
  Administered 2016-07-26: 1000 mL via INTRAVENOUS

## 2016-07-26 NOTE — ED Provider Notes (Signed)
[facility] Emergency Department Provider Note   ____________________________________________   I have reviewed the triage vital signs and the nursing notes.   HISTORY  Chief Complaint Seizure  History limited by: Not Limited   HPI Lance Ray is a 45 y.o. male with history of epilepsy who presents to the emergency department today after a seizure. Patient was at work. Has had seizures there before and co-workers noticed it appeared he was going to have another seizure. He was in his chair and they were able to help him to the ground. The patient was post ictal for EMS when they arrived. He has since become more alert and oriented. He did bite his tongue. Has a small headache which the patient states is normal after his seizures.  States he has been taking his medication. Denies any sleep deprivation. Denies any alcohol use today.    Past Medical History:  Diagnosis Date  . Seizures (HCC)     There are no active problems to display for this patient.   Past Surgical History:  Procedure Laterality Date  . SKIN GRAFT      Prior to Admission medications   Medication Sig Start Date End Date Taking? Authorizing Provider  divalproex (DEPAKOTE ER) 500 MG 24 hr tablet Take 500 mg by mouth daily.    Historical Provider, MD  lamoTRIgine (LAMICTAL) 200 MG tablet Take 200 mg by mouth 2 (two) times daily.    Historical Provider, MD    Allergies Review of patient's allergies indicates no known allergies.  Family History  Problem Relation Age of Onset  . Seizures Mother     Social History Social History  Substance Use Topics  . Smoking status: Never Smoker  . Smokeless tobacco: Current User  . Alcohol use 1.2 oz/week    2 Shots of liquor per week    Review of Systems  Constitutional: Negative for fever. Cardiovascular: Negative for chest pain. Respiratory: Negative for shortness of breath. Gastrointestinal: Negative for abdominal pain,  vomiting and diarrhea. Genitourinary: Negative for dysuria. Musculoskeletal: Negative for back pain. Skin: Negative for rash. Neurological: Positive for headache. Positive for seizure.   10-point ROS otherwise negative.  ____________________________________________   PHYSICAL EXAM:  VITAL SIGNS: ED Triage Vitals  Enc Vitals Group     BP 158/95     Pulse 113     Resp 14     Temp 98.4     Temp src      SpO2 97     Weight    Constitutional: Alert and oriented. Well appearing and in no distress. Eyes: Conjunctivae are normal. Normal extraocular movements. ENT   Head: Normocephalic and atraumatic.   Nose: No congestion/rhinnorhea.   Mouth/Throat: Some blood in oropharynx. Abrasion to right side of tongue, hemostatic.    Neck: No stridor. Hematological/Lymphatic/Immunilogical: No cervical lymphadenopathy. Cardiovascular: Tachycardic, regular rhythm.  No murmurs, rubs, or gallops.  Respiratory: Normal respiratory effort without tachypnea nor retractions. Breath sounds are clear and equal bilaterally. No wheezes/rales/rhonchi. Gastrointestinal: Soft and nontender. No distention.  Genitourinary: Deferred Musculoskeletal: Normal range of motion in all extremities. No lower extremity edema. Neurologic:  Normal speech and language. No gross focal neurologic deficits are appreciated.  Skin:  Skin is warm, dry and intact. No rash noted. Psychiatric: Mood and affect are normal. Speech and behavior are normal. Patient exhibits appropriate insight and judgment.  ____________________________________________    LABS (pertinent positives/negatives)  Labs Reviewed  CBC WITH DIFFERENTIAL/PLATELET - Abnormal; Notable for the following:  Result Value   Hemoglobin 18.7 (*)    HCT 53.6 (*)    MCH 34.1 (*)    RDW 15.2 (*)    Neutro Abs 6.8 (*)    All other components within normal limits  BASIC METABOLIC PANEL - Abnormal; Notable for the following:    Potassium 3.0 (*)     CO2 17 (*)    Glucose, Bld 111 (*)    Anion gap 20 (*)    All other components within normal limits  VALPROIC ACID LEVEL     ____________________________________________   EKG  None  ____________________________________________    RADIOLOGY  None  ____________________________________________   PROCEDURES  Procedures  ____________________________________________   INITIAL IMPRESSION / ASSESSMENT AND PLAN / ED COURSE  Pertinent labs & imaging results that were available during my care of the patient were reviewed by me and considered in my medical decision making (see chart for details).  Patient with history of epilepsy who presents to the ER after seizure at work. At the time of my examination patient is awake, alert. Will plan on checking blood work and depakote level.  Clinical Course   No further seizure activity here. Depakote within normal limits. Will plan on discharging to follow up with neurologist.  ____________________________________________   FINAL CLINICAL IMPRESSION(S) / ED DIAGNOSES  Final diagnoses:  Seizure [facility](HCC)     Note: This dictation was prepared with Dragon dictation. Any transcriptional errors that result from this process are unintentional    Phineas SemenGraydon Forbes Loll, MD 07/27/16 1506

## 2016-07-26 NOTE — Discharge Instructions (Signed)
Please seek medical attention for any high fevers, chest pain, shortness of breath, change in behavior, persistent vomiting, bloody stool or any other new or concerning symptoms.  

## 2016-07-26 NOTE — ED Triage Notes (Signed)
Pt in via triage; pt coming from work tonight.  Pt with witnessed seizure x approximately 2 minutes.  Pt post ictal upon arrival of EMS.  Pt A/Ox4, tachycardic, other vitals WDL upon arrival to ED.  No immediate distress noted at this time.

## 2016-07-27 NOTE — ED Notes (Signed)

## 2016-12-05 ENCOUNTER — Emergency Department
Admission: EM | Admit: 2016-12-05 | Discharge: 2016-12-05 | Disposition: A | Discharge status: Home / Self Care | Payer: Managed Care, Other (non HMO) | Attending: Emergency Medicine | Admitting: Emergency Medicine

## 2016-12-05 ENCOUNTER — Encounter: Payer: Self-pay | Admitting: Obstetrics and Gynecology

## 2016-12-05 ENCOUNTER — Emergency Department: Payer: Managed Care, Other (non HMO)

## 2016-12-05 DIAGNOSIS — Y999 Unspecified external cause status: Secondary | ICD-10-CM | POA: Insufficient documentation

## 2016-12-05 DIAGNOSIS — R569 Unspecified convulsions: Secondary | ICD-10-CM | POA: Diagnosis present

## 2016-12-05 DIAGNOSIS — F17200 Nicotine dependence, unspecified, uncomplicated: Secondary | ICD-10-CM | POA: Insufficient documentation

## 2016-12-05 DIAGNOSIS — Y9389 Activity, other specified: Secondary | ICD-10-CM | POA: Diagnosis not present

## 2016-12-05 DIAGNOSIS — Y92410 Unspecified street and highway as the place of occurrence of the external cause: Secondary | ICD-10-CM | POA: Diagnosis not present

## 2016-12-05 DIAGNOSIS — G40909 Epilepsy, unspecified, not intractable, without status epilepticus: Secondary | ICD-10-CM | POA: Diagnosis not present

## 2016-12-05 LAB — CBC WITH DIFFERENTIAL/PLATELET
Basophils Absolute: 0.1 10*3/uL (ref 0–0.1)
Basophils Relative: 1 %
Eosinophils Absolute: 0 10*3/uL (ref 0–0.7)
Eosinophils Relative: 1 %
HEMATOCRIT: 55.3 % — AB (ref 40.0–52.0)
HEMOGLOBIN: 18.5 g/dL — AB (ref 13.0–18.0)
LYMPHS PCT: 20 %
Lymphs Abs: 2.1 10*3/uL (ref 1.0–3.6)
MCH: 30.4 pg (ref 26.0–34.0)
MCHC: 33.4 g/dL (ref 32.0–36.0)
MCV: 91 fL (ref 80.0–100.0)
MONO ABS: 0.9 10*3/uL (ref 0.2–1.0)
Monocytes Relative: 8 %
NEUTROS ABS: 7.4 10*3/uL — AB (ref 1.4–6.5)
NEUTROS PCT: 70 %
Platelets: 438 10*3/uL (ref 150–440)
RBC: 6.08 MIL/uL — ABNORMAL HIGH (ref 4.40–5.90)
RDW: 20.5 % — AB (ref 11.5–14.5)
WBC: 10.5 10*3/uL (ref 3.8–10.6)

## 2016-12-05 LAB — HEPATIC FUNCTION PANEL
ALBUMIN: 4.5 g/dL (ref 3.5–5.0)
ALK PHOS: 152 U/L — AB (ref 38–126)
ALT: 21 U/L (ref 17–63)
AST: 64 U/L — AB (ref 15–41)
BILIRUBIN DIRECT: 0.4 mg/dL (ref 0.1–0.5)
Indirect Bilirubin: 0.9 mg/dL (ref 0.3–0.9)
Total Bilirubin: 1.3 mg/dL — ABNORMAL HIGH (ref 0.3–1.2)
Total Protein: 7.5 g/dL (ref 6.5–8.1)

## 2016-12-05 LAB — BASIC METABOLIC PANEL
Anion gap: 17 — ABNORMAL HIGH (ref 5–15)
BUN: 9 mg/dL (ref 6–20)
CALCIUM: 9.3 mg/dL (ref 8.9–10.3)
CHLORIDE: 104 mmol/L (ref 101–111)
CO2: 15 mmol/L — AB (ref 22–32)
Creatinine, Ser: 1.14 mg/dL (ref 0.61–1.24)
GFR calc non Af Amer: >60 mL/min (ref >60)
GLUCOSE: 158 mg/dL — AB (ref 65–99)
POTASSIUM: 4.3 mmol/L (ref 3.5–5.1)
Sodium: 136 mmol/L (ref 135–145)

## 2016-12-05 LAB — VALPROIC ACID LEVEL: Valproic Acid Lvl: <10 ug/mL — ABNORMAL LOW (ref 50.0–100.0)

## 2016-12-05 LAB — ETHANOL

## 2016-12-05 MED ORDER — LAMOTRIGINE 200 MG PO TABS: 200.0000 mg | ORAL_TABLET | Freq: Two times a day (BID) | ORAL | 0 refills | Status: DC

## 2016-12-05 MED ORDER — SODIUM CHLORIDE 0.9 % IV BOLUS (SEPSIS)
1000.0000 mL | Freq: Once | INTRAVENOUS | Status: AC
  Administered 2016-12-05: 1000 mL via INTRAVENOUS

## 2016-12-05 MED ORDER — VALPROIC ACID 250 MG PO CAPS
500.0000 mg | ORAL_CAPSULE | Freq: Once | ORAL | Status: AC
  Administered 2016-12-05: 500 mg via ORAL
  Filled 2016-12-05: qty 2

## 2016-12-05 MED ORDER — LAMOTRIGINE 25 MG PO TABS
25.0000 mg | ORAL_TABLET | Freq: Once | ORAL | Status: DC
Start: 2016-12-05 — End: 2016-12-05

## 2016-12-05 MED ORDER — LAMOTRIGINE 100 MG PO TABS
200.0000 mg | ORAL_TABLET | Freq: Once | ORAL | Status: AC
  Administered 2016-12-05: 200 mg via ORAL
  Filled 2016-12-05: qty 2

## 2016-12-05 MED ORDER — DIVALPROEX SODIUM ER 500 MG PO TB24: 500.0000 mg | ORAL_TABLET | Freq: Every day | ORAL | 0 refills | Status: DC

## 2016-12-05 NOTE — ED Notes (Signed)
Pt states that he has family coming to get him. Pt states he will wait in the lobby for them. He denies the need for a wheelchair

## 2016-12-05 NOTE — Discharge Instructions (Signed)
Please restart both of her seizure medications and take them as prescribed. Keep your appointment with her neurologist in 2 weeks as scheduled. Please do not drive a car at any point until you have senior neurologist. Return to the emergency department sooner for any new or worsening symptoms such as if he have a seizure, chest pain, shortness of breath, or for any other concerns.  It was a pleasure to take care of you today, and thank you for coming to our emergency department.  If you have any questions or concerns before leaving please ask the nurse to grab me and I'm more than happy to go through your aftercare instructions again.  If you were prescribed any opioid pain medication today such as Norco, Vicodin, Percocet, morphine, hydrocodone, or oxycodone please make sure you do not drive when you are taking this medication as it can alter your ability to drive safely.  If you have any concerns once you are home that you are not improving or are in fact getting worse before you can make it to your follow-up appointment, please do not hesitate to call 911 and come back for further evaluation.  Merrily BrittleNeil Mertice Uffelman MD  Results for orders placed or performed during the hospital encounter of 12/05/16  Ethanol  Result Value Ref Range   Alcohol, Ethyl (B) <5 <5 mg/dL  CBC with Differential  Result Value Ref Range   WBC 10.5 3.8 - 10.6 K/uL   RBC 6.08 (H) 4.40 - 5.90 MIL/uL   Hemoglobin 18.5 (H) 13.0 - 18.0 g/dL   HCT 40.955.3 (H) 81.140.0 - 91.452.0 %   MCV 91.0 80.0 - 100.0 fL   MCH 30.4 26.0 - 34.0 pg   MCHC 33.4 32.0 - 36.0 g/dL   RDW 78.220.5 (H) 95.611.5 - 21.314.5 %   Platelets 438 150 - 440 K/uL   Neutrophils Relative % 70 %   Neutro Abs 7.4 (H) 1.4 - 6.5 K/uL   Lymphocytes Relative 20 %   Lymphs Abs 2.1 1.0 - 3.6 K/uL   Monocytes Relative 8 %   Monocytes Absolute 0.9 0.2 - 1.0 K/uL   Eosinophils Relative 1 %   Eosinophils Absolute 0.0 0 - 0.7 K/uL   Basophils Relative 1 %   Basophils Absolute 0.1 0 - 0.1  K/uL  Basic metabolic panel  Result Value Ref Range   Sodium 136 135 - 145 mmol/L   Potassium 4.3 3.5 - 5.1 mmol/L   Chloride 104 101 - 111 mmol/L   CO2 15 (L) 22 - 32 mmol/L   Glucose, Bld 158 (H) 65 - 99 mg/dL   BUN 9 6 - 20 mg/dL   Creatinine, Ser 0.861.14 0.61 - 1.24 mg/dL   Calcium 9.3 8.9 - 57.810.3 mg/dL   GFR calc non Af Amer >60 >60 mL/min   GFR calc Af Amer >60 >60 mL/min   Anion gap 17 (H) 5 - 15  Hepatic function panel  Result Value Ref Range   Total Protein 7.5 6.5 - 8.1 g/dL   Albumin 4.5 3.5 - 5.0 g/dL   AST 64 (H) 15 - 41 U/L   ALT 21 17 - 63 U/L   Alkaline Phosphatase 152 (H) 38 - 126 U/L   Total Bilirubin 1.3 (H) 0.3 - 1.2 mg/dL   Bilirubin, Direct 0.4 0.1 - 0.5 mg/dL   Indirect Bilirubin 0.9 0.3 - 0.9 mg/dL  Valproic acid level  Result Value Ref Range   Valproic Acid Lvl <10 (L) 50.0 - 100.0 ug/mL  Dg Chest Port 1 View  Result Date: 12/05/2016 CLINICAL DATA:  Post seizure, now postictal, shortness of breath EXAM: PORTABLE CHEST 1 VIEW COMPARISON:  Portable exam 1347 hours compared to 11/11/2009 FINDINGS: Normal heart size, mediastinal contours, and pulmonary vascularity. Lungs hypoinflated with minimal bibasilar atelectasis. Remaining lungs clear. No pleural effusion or pneumothorax. No acute osseous findings. IMPRESSION: Basilar hypoinflation and minimal bibasilar atelectasis. Electronically Signed   By: Ulyses Southward M.D.   On: 12/05/2016 14:04
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
## 2017-01-12 ENCOUNTER — Emergency Department
Admission: EM | Admit: 2017-01-12 | Discharge: 2017-01-12 | Disposition: A | Discharge status: Home / Self Care | Payer: Managed Care, Other (non HMO) | Attending: Emergency Medicine | Admitting: Emergency Medicine

## 2017-01-12 ENCOUNTER — Encounter: Payer: Self-pay | Admitting: Intensive Care

## 2017-01-12 DIAGNOSIS — F17200 Nicotine dependence, unspecified, uncomplicated: Secondary | ICD-10-CM | POA: Diagnosis not present

## 2017-01-12 DIAGNOSIS — G40909 Epilepsy, unspecified, not intractable, without status epilepticus: Secondary | ICD-10-CM | POA: Insufficient documentation

## 2017-01-12 DIAGNOSIS — R569 Unspecified convulsions: Secondary | ICD-10-CM | POA: Diagnosis present

## 2017-01-12 LAB — CBC WITH DIFFERENTIAL/PLATELET
BASOS ABS: 0 10*3/uL (ref 0–0.1)
BASOS PCT: 1 %
EOS ABS: 0.1 10*3/uL (ref 0–0.7)
Eosinophils Relative: 2 %
HCT: 46.1 % (ref 40.0–52.0)
Hemoglobin: 15.6 g/dL (ref 13.0–18.0)
Lymphocytes Relative: 21 %
Lymphs Abs: 1.2 10*3/uL (ref 1.0–3.6)
MCH: 31.6 pg (ref 26.0–34.0)
MCHC: 33.8 g/dL (ref 32.0–36.0)
MCV: 93.3 fL (ref 80.0–100.0)
Monocytes Absolute: 0.4 10*3/uL (ref 0.2–1.0)
Monocytes Relative: 7 %
Neutro Abs: 3.9 10*3/uL (ref 1.4–6.5)
Neutrophils Relative %: 69 %
PLATELETS: 286 10*3/uL (ref 150–440)
RBC: 4.94 MIL/uL (ref 4.40–5.90)
RDW: 17.9 % — ABNORMAL HIGH (ref 11.5–14.5)
WBC: 5.7 10*3/uL (ref 3.8–10.6)

## 2017-01-12 LAB — COMPREHENSIVE METABOLIC PANEL
ALBUMIN: 4 g/dL (ref 3.5–5.0)
ALK PHOS: 88 U/L (ref 38–126)
ALT: 22 U/L (ref 17–63)
AST: 34 U/L (ref 15–41)
Anion gap: 9 (ref 5–15)
BILIRUBIN TOTAL: 0.6 mg/dL (ref 0.3–1.2)
BUN: 13 mg/dL (ref 6–20)
CALCIUM: 9 mg/dL (ref 8.9–10.3)
CO2: 23 mmol/L (ref 22–32)
Chloride: 107 mmol/L (ref 101–111)
Creatinine, Ser: 0.79 mg/dL (ref 0.61–1.24)
GFR calc Af Amer: >60 mL/min (ref >60)
GLUCOSE: 143 mg/dL — AB (ref 65–99)
POTASSIUM: 3.2 mmol/L — AB (ref 3.5–5.1)
Sodium: 139 mmol/L (ref 135–145)
TOTAL PROTEIN: 6.9 g/dL (ref 6.5–8.1)

## 2017-01-12 LAB — VALPROIC ACID LEVEL

## 2017-01-12 NOTE — Discharge Instructions (Addendum)
Your seizure medicines. I spoke with the neurologist to [facility]. He will contact your regular neurologist and set up a follow-up appointment. Please return if you feel worse at all, lightheaded, get a fever or feel sicker at all

## 2017-01-12 NOTE — ED Triage Notes (Signed)
Patient arrived by EMS from work for seizure. Co-workers witnessed seizure and tried to help patient to the ground but he fell and hit his forehead before they could help him. A&O x4 upon arrival. Unaware of seizure

## 2017-01-12 NOTE — ED Provider Notes (Addendum)
[facility] Emergency Department Provider Note   ____________________________________________   First MD Initiated Contact with Patient 01/12/17 1600     (approximate)  I have reviewed the triage vital signs and the nursing notes.   HISTORY  Chief Complaint Seizures    HPI Lance Ray is a 46 y.o. male a known history of seizures. He takes Depakote and Lamictal. Instead of taking his seizure medicine today he put it in his pocket and forgot. He had one seizure today. It was witnessed by his coworkers attempted to catch him but they missed and he fell and hit his head. At present he is awake alert oriented 3 has no headache nausea vomiting blurry vision or any other complaints. For the last few months and started I am having one seizure every 6 months she's had possibly as many as one seizure a month. He was going to try to call his neurologist and get follow-up with his neurologist who is at [facility].   Past Medical History:  Diagnosis Date  . Seizures (HCC)     There are no active problems to display for this patient.   Past Surgical History:  Procedure Laterality Date  . SKIN GRAFT      Prior to Admission medications   Medication Sig Start Date End Date Taking? Authorizing Provider  divalproex (DEPAKOTE ER) 500 MG 24 hr tablet Take 1 tablet (500 mg total) by mouth daily. 12/05/16   Merrily Brittle, MD  lamoTRIgine (LAMICTAL) 200 MG tablet Take 1 tablet (200 mg total) by mouth 2 (two) times daily. 12/05/16   Merrily Brittle, MD    Allergies Patient has no known allergies.  Family History  Problem Relation Age of Onset  . Seizures Mother     Social History Social History  Substance Use Topics  . Smoking status: Never Smoker  . Smokeless tobacco: Current User  . Alcohol use 1.2 oz/week    2 Shots of liquor per week    Review of Systems Constitutional: No fever/chills Eyes: No visual changes. ENT: No sore throat. Cardiovascular:  Denies chest pain. Respiratory: Denies shortness of breath. Gastrointestinal: No abdominal pain.  No nausea, no vomiting.  No diarrhea.  No constipation. Genitourinary: Negative for dysuria. Musculoskeletal: Negative for back pain. Skin: Negative for rash. Neurological: Negative for headaches, focal weakness or numbness.  10-point ROS otherwise negative.  ____________________________________________   PHYSICAL EXAM:  VITAL SIGNS: ED Triage Vitals  Enc Vitals Group     BP 01/12/17 1555 (!) 158/106     Pulse Rate 01/12/17 1555 (!) 118     Resp 01/12/17 1555 15     Temp 01/12/17 1555 99.1 F (37.3 C)     Temp Source 01/12/17 1555 Oral     SpO2 01/12/17 1555 99 %     Weight 01/12/17 1557 160 lb (72.6 kg)     Height 01/12/17 1557  (1.753 m)     Head Circumference --      Peak Flow --      Pain Score --      Pain Loc --      Pain Edu? --      Excl. in GC? --     Constitutional: Alert and oriented. Well appearing and in no acute distress. Eyes: Conjunctivae are normal. PERRL. EOMI. Head: Atraumatic. Nose: No congestion/rhinnorhea. Mouth/Throat: Mucous membranes are moist.  Oropharynx non-erythematous. Neck: No stridor.  No cervical spine tenderness to palpation. Cardiovascular: Normal rate, regular rhythm. Grossly normal  heart sounds.  Good peripheral circulation. Respiratory: Normal respiratory effort.  No retractions. Lungs CTAB. Gastrointestinal: Soft and nontender. No distention. No abdominal bruits. No CVA tenderness.  Musculoskeletal: No lower extremity tenderness nor edema.  No joint effusions. Neurologic:  Normal speech and language. No gross focal neurologic deficits are appreciated. Renal nerves II through XII are intact of the visual fields were not checked her belly rapid alternating movements in the hands and finger to nose are normal motor strength is 5 over 5 throughout and his sensation is intact throughout. No gait instability. Skin:  Skin is warm, dry  and intact. No rash noted. Psychiatric: Mood and affect are normal. Speech and behavior are normal.  ____________________________________________   LABS (all labs ordered are listed, but only abnormal results are displayed)  Labs Reviewed  COMPREHENSIVE METABOLIC PANEL - Abnormal; Notable for the following:       Result Value   Potassium 3.2 (*)    Glucose, Bld 143 (*)    All other components within normal limits  CBC WITH DIFFERENTIAL/PLATELET - Abnormal; Notable for the following:    RDW 17.9 (*)    All other components within normal limits  VALPROIC ACID LEVEL - Abnormal; Notable for the following:    Valproic Acid Lvl <10 (*)    All other components within normal limits   ____________________________________________  EKG  Interpreted by me shows sinus tachycardia rate of 122 normal axis no acute ST-T wave changes. ____________________________________________  RADIOLOGY   ____________________________________________   PROCEDURES  Procedure(s) performed:   Procedures  Critical Care performed:   ____________________________________________   INITIAL IMPRESSION / ASSESSMENT AND PLAN / ED COURSE  Pertinent labs & imaging results that were available during my care of the patient were reviewed by me and considered in my medical decision making (see chart for details).  Dragon has stopped working -at [employer] PM patient feels well ht rate is 102 I will discharge him. Labs are OK shortly therafte ht rate fals below 100.  He will follow up with his [facility] neurologist, take him medication and return if worse at all.      ____________________________________________   FINAL CLINICAL IMPRESSION(S) / ED DIAGNOSES  Final diagnoses:  Seizure (HCC)      NEW MEDICATIONS STARTED DURING THIS VISIT:  New Prescriptions   No medications on file     Note:  This document was prepared using Dragon voice recognition software and may include unintentional dictation  errors.    Arnaldo Natal, MD 01/12/17 1721    Arnaldo Natal, MD 01/12/17 [phone]

## 2017-03-22 ENCOUNTER — Emergency Department: Payer: Managed Care, Other (non HMO)

## 2017-03-22 ENCOUNTER — Emergency Department
Admission: EM | Admit: 2017-03-22 | Discharge: 2017-03-22 | Disposition: A | Discharge status: Home / Self Care | Payer: Managed Care, Other (non HMO) | Attending: Emergency Medicine | Admitting: Emergency Medicine

## 2017-03-22 DIAGNOSIS — G40909 Epilepsy, unspecified, not intractable, without status epilepticus: Secondary | ICD-10-CM

## 2017-03-22 DIAGNOSIS — R569 Unspecified convulsions: Secondary | ICD-10-CM | POA: Diagnosis present

## 2017-03-22 DIAGNOSIS — Z79899 Other long term (current) drug therapy: Secondary | ICD-10-CM | POA: Diagnosis not present

## 2017-03-22 DIAGNOSIS — F17290 Nicotine dependence, other tobacco product, uncomplicated: Secondary | ICD-10-CM | POA: Diagnosis not present

## 2017-03-22 LAB — COMPREHENSIVE METABOLIC PANEL
ALT: 56 U/L (ref 17–63)
AST: 71 U/L — ABNORMAL HIGH (ref 15–41)
Albumin: 4.2 g/dL (ref 3.5–5.0)
Alkaline Phosphatase: 146 U/L — ABNORMAL HIGH (ref 38–126)
Anion gap: 13 (ref 5–15)
BUN: 7 mg/dL (ref 6–20)
CHLORIDE: 106 mmol/L (ref 101–111)
CO2: 25 mmol/L (ref 22–32)
CREATININE: 0.96 mg/dL (ref 0.61–1.24)
Calcium: 9 mg/dL (ref 8.9–10.3)
GFR calc non Af Amer: >60 mL/min (ref >60)
Glucose, Bld: 117 mg/dL — ABNORMAL HIGH (ref 65–99)
POTASSIUM: 3.2 mmol/L — AB (ref 3.5–5.1)
SODIUM: 144 mmol/L (ref 135–145)
Total Bilirubin: 0.8 mg/dL (ref 0.3–1.2)
Total Protein: 7.1 g/dL (ref 6.5–8.1)

## 2017-03-22 LAB — CBC
HCT: 51.7 % (ref 40.0–52.0)
Hemoglobin: 17.9 g/dL (ref 13.0–18.0)
MCH: 34.8 pg — AB (ref 26.0–34.0)
MCHC: 34.7 g/dL (ref 32.0–36.0)
MCV: 100.3 fL — ABNORMAL HIGH (ref 80.0–100.0)
PLATELETS: 364 10*3/uL (ref 150–440)
RBC: 5.15 MIL/uL (ref 4.40–5.90)
RDW: 18.4 % — ABNORMAL HIGH (ref 11.5–14.5)
WBC: 6 10*3/uL (ref 3.8–10.6)

## 2017-03-22 LAB — VALPROIC ACID LEVEL: Valproic Acid Lvl: <10 ug/mL — ABNORMAL LOW (ref 50.0–100.0)

## 2017-03-22 MED ORDER — SODIUM CHLORIDE 0.9 % IV BOLUS (SEPSIS)
1000.0000 mL | Freq: Once | INTRAVENOUS | Status: AC
  Administered 2017-03-22: 1000 mL via INTRAVENOUS

## 2017-03-22 MED ORDER — DIVALPROEX SODIUM 500 MG PO DR TAB
500.0000 mg | DELAYED_RELEASE_TABLET | Freq: Once | ORAL | Status: AC
  Administered 2017-03-22: 500 mg via ORAL
  Filled 2017-03-22: qty 1

## 2017-03-22 MED ORDER — DIVALPROEX SODIUM 250 MG PO DR TAB: 250.0000 mg | DELAYED_RELEASE_TABLET | Freq: Two times a day (BID) | ORAL | 0 refills | Status: DC

## 2017-03-22 NOTE — ED Provider Notes (Signed)
[facility] Emergency Department Provider Note _   First MD Initiated Contact with Patient 03/22/17 0045     (approximate)  I have reviewed the triage vital signs and the nursing notes.   HISTORY  Chief Complaint Seizures    HPI Lance Ray is a 46 y.o. male [place] EMS [place] gas station status post witnessed generalized tonic-clonic seizure lasting approximately 1-1-1/2 minutes. EMS states on arrival patient minimally responsive however during the course of the care patient is now alert and oriented. Patient admits to being noncompliant with his Depakote as he has not taken it in the past 5 days. Patient states last seizure was approximately a week ago. Patient denies any fever no EtOH ingestion.   Past Medical History:  Diagnosis Date  . Seizures (HCC)     There are no active problems to display for this patient.   Past Surgical History:  Procedure Laterality Date  . SKIN GRAFT      Prior to Admission medications   Medication Sig Start Date End Date Taking? Authorizing Provider  divalproex (DEPAKOTE ER) 500 MG 24 hr tablet Take 1 tablet (500 mg total) by mouth daily. 12/05/16   Merrily Brittleifenbark, Neil, MD  lamoTRIgine (LAMICTAL) 200 MG tablet Take 1 tablet (200 mg total) by mouth 2 (two) times daily. 12/05/16   Merrily Brittleifenbark, Neil, MD    Allergies Patient has no known allergies.  Family History  Problem Relation Age of Onset  . Seizures Mother     Social History Social History  Substance Use Topics  . Smoking status: Never Smoker  . Smokeless tobacco: Current User  . Alcohol use 1.2 oz/week    2 Shots of liquor per week    Review of Systems Constitutional: No fever/chills Eyes: No visual changes. ENT: No sore throat. Cardiovascular: Denies chest pain. Respiratory: Denies shortness of breath. Gastrointestinal: No abdominal pain.  No nausea, no vomiting.  No diarrhea.  No constipation. Genitourinary: Negative for  dysuria. Musculoskeletal: Negative for neck pain.  Negative for back pain. Integumentary: Negative for rash. Neurological: Negative for headaches, focal weakness or numbness.   ____________________________________________   PHYSICAL EXAM:  VITAL SIGNS: ED Triage Vitals  Enc Vitals Group     BP 03/22/17 0051 (!) 158/105     Pulse Rate 03/22/17 0051 (!) 127     Resp 03/22/17 0051 20     Temp 03/22/17 0051 98.1 F (36.7 C)     Temp Source 03/22/17 0051 Oral     SpO2 03/22/17 0051 95 %     Weight 03/22/17 0052 81.6 kg (180 lb)     Height 03/22/17 0052 1.753 m (5\' 9" )     Head Circumference --      Peak Flow --      Pain Score 03/22/17 0049 0     Pain Loc --      Pain Edu? --      Excl. in GC? --     Constitutional: Alert and oriented. Well appearing and in no acute distress. Eyes: Conjunctivae are normal. PERRL. EOMI. Head: Atraumatic. Ears:  Healthy appearing ear canals and TMs bilaterally Nose: No congestion/rhinnorhea. Mouth/Throat: Mucous membranes are moist.  Oropharynx non-erythematous. Neck: No stridor.   Cardiovascular: Normal rate, regular rhythm. Good peripheral circulation. Grossly normal heart sounds. Respiratory: Normal respiratory effort.  No retractions. Lungs CTAB. Gastrointestinal: Soft and nontender. No distention.  Musculoskeletal: No lower extremity tenderness nor edema. No gross deformities of extremities. Neurologic:  Normal speech and  language. No gross focal neurologic deficits are appreciated.  Skin:  Skin is warm, dry and intact. No rash noted. Psychiatric: Mood and affect are normal. Speech and behavior are normal.   Procedures   ____________________________________________   INITIAL IMPRESSION / ASSESSMENT AND PLAN / ED COURSE  Pertinent labs & imaging results that were available during my care of the patient were reviewed by me and considered in my medical decision making (see chart for  details).  ----------------------------------------- 3:29 AM on 03/22/2017 ----------------------------------------- Patient requesting to leave the emergency department. I spoke with the patient at length and informed him of the risk of having another seizure however he was adamant that he wanted to leave the emergency department at this time. Patient states that he does not have his prescription for Depakote 250 mg twice a day at home and ensured me that if I were to prescribe her that he would take it as prescribed. I spoke with the patient at length and told him "do not drive as this may result in death or serious bodily harm of yourself and/or someone else"      ____________________________________________  FINAL CLINICAL IMPRESSION(S) / ED DIAGNOSES  Final diagnoses:  Seizure disorder (HCC)     MEDICATIONS GIVEN DURING THIS VISIT:  Medications - No data to display   NEW OUTPATIENT MEDICATIONS STARTED DURING THIS VISIT:  New Prescriptions   No medications on file    Modified Medications   No medications on file    Discontinued Medications   No medications on file     Note:  This document was prepared using Dragon voice recognition software and may include unintentional dictation errors.    Darci Current, MD 03/22/17 [phone]

## 2017-03-22 NOTE — ED Notes (Signed)
Patient transported to CT 

## 2017-03-22 NOTE — ED Notes (Signed)
Patient discharge and follow up information reviewed with patient by ED nursing staff and patient given the opportunity to ask questions pertaining to ED visit and discharge plan of care. Patient advised that should symptoms not continue to improve, resolve entirely, or should new symptoms develop then a follow up visit with their PCP or a return visit to the ED may be warranted. Patient verbalized consent and understanding of discharge plan of care including potential need for further evaluation as well as No driving till being seen by Neurologist. Patient being discharged in stable condition per attending ED physician on duty.

## 2017-03-22 NOTE — ED Triage Notes (Signed)
EMS reports pt was at a gas station and had " witnessed 1 min to minute and a half seizure like activity." EMS also reports during this pt fell and has lac to back of head, bleeding controlled on arrival. Pt states he takes Depakote for seizures however states he "ran out over a week ago." Pt A&O at this time, denies pain at this time.

## 2017-03-22 NOTE — ED Notes (Signed)
Pt called out, this RN went to room and pt states "I am ready to go, if I don't get papers soon I will walk out." This RN notified pt I will talk with EDP, pt verbalized understanding of this.

## 2017-03-22 NOTE — ED Notes (Addendum)
EDP and this RN in room with pt, EDP talking with pt at this time. EDP Manson PasseyBrown states to pt "Do not drive at all, you could kill yourself or someone else and [facility] Pt verbalized understanding of this as well stating "I will call a cab to get home."

## 2017-08-16 ENCOUNTER — Other Ambulatory Visit: Payer: Self-pay

## 2017-08-16 ENCOUNTER — Emergency Department
Admission: EM | Admit: 2017-08-16 | Discharge: 2017-08-16 | Disposition: A | Discharge status: Home / Self Care | Payer: Managed Care, Other (non HMO) | Attending: Emergency Medicine | Admitting: Emergency Medicine

## 2017-08-16 DIAGNOSIS — Z79899 Other long term (current) drug therapy: Secondary | ICD-10-CM | POA: Insufficient documentation

## 2017-08-16 DIAGNOSIS — R569 Unspecified convulsions: Secondary | ICD-10-CM | POA: Insufficient documentation

## 2017-08-16 LAB — BASIC METABOLIC PANEL
ANION GAP: 16 — AB (ref 5–15)
BUN: 11 mg/dL (ref 6–20)
CALCIUM: 9 mg/dL (ref 8.9–10.3)
CHLORIDE: 102 mmol/L (ref 101–111)
CO2: 20 mmol/L — AB (ref 22–32)
CREATININE: 1.03 mg/dL (ref 0.61–1.24)
GFR calc non Af Amer: >60 mL/min (ref >60)
GLUCOSE: 102 mg/dL — AB (ref 65–99)
Potassium: 3.2 mmol/L — ABNORMAL LOW (ref 3.5–5.1)
SODIUM: 138 mmol/L (ref 135–145)

## 2017-08-16 LAB — CBC WITH DIFFERENTIAL/PLATELET
BASOS ABS: 0 10*3/uL (ref 0–0.1)
BASOS PCT: 0 %
EOS ABS: 0.1 10*3/uL (ref 0–0.7)
Eosinophils Relative: 2 %
HEMATOCRIT: 51.2 % (ref 40.0–52.0)
HEMOGLOBIN: 17.4 g/dL (ref 13.0–18.0)
Lymphocytes Relative: 29 %
Lymphs Abs: 2.2 10*3/uL (ref 1.0–3.6)
MCH: 30.2 pg (ref 26.0–34.0)
MCHC: 34 g/dL (ref 32.0–36.0)
MCV: 88.8 fL (ref 80.0–100.0)
Monocytes Absolute: 0.6 10*3/uL (ref 0.2–1.0)
Monocytes Relative: 8 %
NEUTROS ABS: 4.7 10*3/uL (ref 1.4–6.5)
NEUTROS PCT: 61 %
Platelets: 383 10*3/uL (ref 150–440)
RBC: 5.77 MIL/uL (ref 4.40–5.90)
RDW: 14.9 % — ABNORMAL HIGH (ref 11.5–14.5)
WBC: 7.6 10*3/uL (ref 3.8–10.6)

## 2017-08-16 LAB — VALPROIC ACID LEVEL: Valproic Acid Lvl: <10 ug/mL — ABNORMAL LOW (ref 50.0–100.0)

## 2017-08-16 MED ORDER — PHENYTOIN SODIUM 50 MG/ML IJ SOLN
1000.0000 mg | Freq: Once | INTRAMUSCULAR | Status: AC
  Administered 2017-08-16: 1000 mg via INTRAVENOUS
  Filled 2017-08-16: qty 20

## 2017-08-16 MED ORDER — SODIUM CHLORIDE 0.9 % IV BOLUS (SEPSIS)
1000.0000 mL | Freq: Once | INTRAVENOUS | Status: AC
Start: 2017-08-16 — End: 2017-08-16
  Administered 2017-08-16: 1000 mL via INTRAVENOUS

## 2017-08-16 MED ORDER — DIVALPROEX SODIUM 250 MG PO DR TAB
250.0000 mg | DELAYED_RELEASE_TABLET | Freq: Two times a day (BID) | ORAL | 0 refills | Status: DC
Start: 2017-08-16 — End: 2019-01-31

## 2017-08-16 NOTE — ED Notes (Signed)
Pt states he remembers waking up this AM and watching TV. States he felt fine. Then he remembers EMS being at his house.

## 2017-08-16 NOTE — ED Notes (Signed)
Pt was made aware that we are waiting for medication from pharmacy at this time. Pt is understanding and denying any needs at this time.

## 2017-08-16 NOTE — Discharge Instructions (Signed)
As we discussed it is very important that you do not drive, go up on roofs, swim, or put yourself in any situation that might be dangerous for you or others if you were to have another seizure until you are cleared by a neurologist. Please seek medical attention for any high fevers, chest pain, shortness of breath, change in behavior, persistent vomiting, bloody stool or any other new or concerning symptoms. ° °

## 2017-08-16 NOTE — ED Notes (Signed)
ED Provider at bedside. 

## 2017-08-16 NOTE — ED Triage Notes (Signed)
Pt arrives to ER via [facility] for possible seizure. Pt found lying face down by family. Pt has hx of seizures; takes Depakote but has not taken for last week. Pt found with empty bottle of liquor beside him. Pt states he had a few shots last night, drinks daily-a few shots of liquor. Pt alert and oriented X4 at time of EMS arrival and remains. ST with EMS. 20G to L forearm by EMS. CBG 108 with EMS.

## 2017-08-16 NOTE — ED Notes (Signed)
Nurse introduced herself to pt. Pt in NAD at this time. Pt stating that he has no pain and is ready to go home.

## 2017-08-16 NOTE — ED Provider Notes (Signed)
[facility] Emergency Department Provider Note  _______________________________________   I have reviewed the triage vital signs and the nursing notes.   HISTORY  Chief Complaint Seizure  History limited by: Not Limited   HPI Lance Ray is a 46 y.o. male who presents to the emergency department today because of seizure.  TIMING: happened earlier today QUALITY: seizure CONTEXT: patient has history of epilepsy. States he has not been on his medication for one week having run out of it. In addition he states he did drink alcohol last night. ASSOCIATED SYMPTOMS: denies any headache or pain.  Per medical record review patient has a history of seizures.  Past Medical History:  Diagnosis Date  . Seizures (HCC)     There are no active problems to display for this patient.   Past Surgical History:  Procedure Laterality Date  . SKIN GRAFT      Prior to Admission medications   Medication Sig Start Date End Date Taking? Authorizing Provider  divalproex (DEPAKOTE ER) 500 MG 24 hr tablet Take 1 tablet (500 mg total) by mouth daily. 12/05/16   Merrily Brittleifenbark, Neil, MD  divalproex (DEPAKOTE) 250 MG DR tablet Take 1 tablet (250 mg total) by mouth 2 (two) times daily. 03/22/17 03/22/18  Darci CurrentBrown, [place] N, MD  lamoTRIgine (LAMICTAL) 200 MG tablet Take 1 tablet (200 mg total) by mouth 2 (two) times daily. 12/05/16   Merrily Brittleifenbark, Neil, MD    Allergies Patient has no known allergies.  Family History  Problem Relation Age of Onset  . Seizures Mother     Social History Social History   Tobacco Use  . Smoking status: Never Smoker  . Smokeless tobacco: Current User  Substance Use Topics  . Alcohol use: Yes    Alcohol/week: 1.2 oz    Types: 2 Shots of liquor per week  . Drug use: No    Review of Systems Constitutional: No fever/chills Eyes: No visual changes. ENT: No sore throat. Cardiovascular: Denies chest pain. Respiratory: Denies shortness of  breath. Gastrointestinal: No abdominal pain.  No nausea, no vomiting.  No diarrhea.   Genitourinary: Negative for dysuria. Musculoskeletal: Negative for back pain. Skin: Negative for rash. Neurological: Positive for seizure.   ____________________________________________   PHYSICAL EXAM:  VITAL SIGNS: ED Triage Vitals  Enc Vitals Group     BP 08/16/17 0944 (!) 170/94     Pulse Rate 08/16/17 0944 (!) 133     Resp 08/16/17 0944 (!) 22     Temp 08/16/17 0944 98.4 F (36.9 C)     Temp Source 08/16/17 0944 Oral     SpO2 08/16/17 0944 97 %     Weight 08/16/17 0945 160 lb (72.6 kg)     Height 08/16/17 0945 5\' 9"  (1.753 m)     Head Circumference --      Peak Flow --      Pain Score 08/16/17 0944 0   Constitutional: Alert and oriented. Well appearing and in no distress. Eyes: Conjunctivae are normal.  ENT   Head: Normocephalic   Nose: No congestion/rhinnorhea.   Mouth/Throat: Mucous membranes are moist.   Neck: No stridor. Hematological/Lymphatic/Immunilogical: No cervical lymphadenopathy. Cardiovascular: Normal rate, regular rhythm.  No murmurs, rubs, or gallops.  Respiratory: Normal respiratory effort without tachypnea nor retractions. Breath sounds are clear and equal bilaterally. No wheezes/rales/rhonchi. Gastrointestinal: Soft and non tender. No rebound. No guarding.  Genitourinary: Deferred Musculoskeletal: Normal range of motion in all extremities. No lower extremity edema. Neurologic:  Normal  speech and language. No gross focal neurologic deficits are appreciated.  Skin:  Skin is warm, dry and intact. No rash noted. Psychiatric: Mood and affect are normal. Speech and behavior are normal. Patient exhibits appropriate insight and judgment.  ____________________________________________    LABS (pertinent positives/negatives)  BMP na 138 CBC wbc 7.6 Valproate <10  ____________________________________________   EKG  I, Phineas SemenGraydon Mannix Kroeker, attending  physician, personally viewed and interpreted this EKG  EKG Time: 0944 Rate: 131 Rhythm: sinus tachycardia Axis: normal Intervals: qtc 473 QRS: narrow ST changes: no st elevation Impression: abnormal ekg   ____________________________________________    RADIOLOGY  None  ____________________________________________   PROCEDURES  Procedures  ____________________________________________   INITIAL IMPRESSION / ASSESSMENT AND PLAN / ED COURSE  Pertinent labs & imaging results that were available during my care of the patient were reviewed by me and considered in my medical decision making (see chart for details).  Patient presents after seizure. Think likely due to medication non compliance and alcohol use. Sodium level within normal limits, no sign of infection. Patient was given IV dose of dilantin in the emergency department. Will give patient prescription for medication.Discussed importance of taking medication, alcohol cessation with patient. Discussed that he should not drive or put himself or others in a dangerous position if he was to have another seizure.  ____________________________________________   FINAL CLINICAL IMPRESSION(S) / ED DIAGNOSES  Final diagnoses:  Seizure [facility](HCC)     Note: This dictation was prepared with Dragon dictation. Any transcriptional errors that result from this process are unintentional     Phineas SemenGoodman, Patrik Turnbaugh, MD 08/16/17 1220

## 2018-06-21 ENCOUNTER — Emergency Department
Admission: EM | Admit: 2018-06-21 | Discharge: 2018-06-21 | Disposition: A | Discharge status: Home / Self Care | Payer: Self-pay | Attending: Student in an Organized Health Care Education/Training Program | Admitting: Student in an Organized Health Care Education/Training Program

## 2018-06-21 ENCOUNTER — Encounter: Payer: Self-pay | Admitting: Emergency Medicine

## 2018-06-21 ENCOUNTER — Emergency Department: Payer: Self-pay

## 2018-06-21 ENCOUNTER — Other Ambulatory Visit: Payer: Self-pay

## 2018-06-21 DIAGNOSIS — R569 Unspecified convulsions: Secondary | ICD-10-CM | POA: Insufficient documentation

## 2018-06-21 DIAGNOSIS — Z79899 Other long term (current) drug therapy: Secondary | ICD-10-CM | POA: Insufficient documentation

## 2018-06-21 DIAGNOSIS — S0083XA Contusion of other part of head, initial encounter: Secondary | ICD-10-CM | POA: Insufficient documentation

## 2018-06-21 DIAGNOSIS — Y33XXXA Other specified events, undetermined intent, initial encounter: Secondary | ICD-10-CM | POA: Insufficient documentation

## 2018-06-21 DIAGNOSIS — Y939 Activity, unspecified: Secondary | ICD-10-CM | POA: Insufficient documentation

## 2018-06-21 DIAGNOSIS — Y999 Unspecified external cause status: Secondary | ICD-10-CM | POA: Insufficient documentation

## 2018-06-21 DIAGNOSIS — F17220 Nicotine dependence, chewing tobacco, uncomplicated: Secondary | ICD-10-CM | POA: Insufficient documentation

## 2018-06-21 DIAGNOSIS — Y92481 Parking lot as the place of occurrence of the external cause: Secondary | ICD-10-CM | POA: Insufficient documentation

## 2018-06-21 DIAGNOSIS — Z9119 Patient's noncompliance with other medical treatment and regimen: Secondary | ICD-10-CM | POA: Insufficient documentation

## 2018-06-21 LAB — BASIC METABOLIC PANEL
Anion gap: 16 — ABNORMAL HIGH (ref 5–15)
BUN: 13 mg/dL (ref 6–20)
CALCIUM: 9.2 mg/dL (ref 8.9–10.3)
CO2: 21 mmol/L — ABNORMAL LOW (ref 22–32)
CREATININE: 1.14 mg/dL (ref 0.61–1.24)
Chloride: 106 mmol/L (ref 98–111)
GFR calc non Af Amer: >60 mL/min (ref >60)
Glucose, Bld: 125 mg/dL — ABNORMAL HIGH (ref 70–99)
Potassium: 3.4 mmol/L — ABNORMAL LOW (ref 3.5–5.1)
Sodium: 143 mmol/L (ref 135–145)

## 2018-06-21 LAB — CBC
HCT: 45.3 % (ref 40.0–52.0)
Hemoglobin: 15.6 g/dL (ref 13.0–18.0)
MCH: 30.6 pg (ref 26.0–34.0)
MCHC: 34.5 g/dL (ref 32.0–36.0)
MCV: 88.8 fL (ref 80.0–100.0)
PLATELETS: 299 10*3/uL (ref 150–440)
RBC: 5.1 MIL/uL (ref 4.40–5.90)
RDW: 16 % — ABNORMAL HIGH (ref 11.5–14.5)
WBC: 5.8 10*3/uL (ref 3.8–10.6)

## 2018-06-21 MED ORDER — DIVALPROEX SODIUM 250 MG PO DR TAB
250.0000 mg | DELAYED_RELEASE_TABLET | Freq: Once | ORAL | Status: AC
  Administered 2018-06-21: 250 mg via ORAL
  Filled 2018-06-21: qty 1

## 2018-06-21 MED ORDER — DIVALPROEX SODIUM ER 500 MG PO TB24: 500.0000 mg | ORAL_TABLET | Freq: Every day | ORAL | 3 refills | Status: DC

## 2018-06-21 NOTE — ED Provider Notes (Signed)
[facility] Emergency Department Provider Note    First MD Initiated Contact with Patient 06/21/18 1941     (approximate)  I have reviewed the triage vital signs and the nursing notes.   HISTORY  Chief Complaint Seizures    HPI Lance Ray is a 47 y.o. male with a history of seizures was to be on Depakote presents the ER via EMS after he was found in a parking lot unresponsive with hematoma to left forehead after a reported seizure activity.  Patient states he has not taken his Depakote and never a month because he cannot afford it.  Denies any pain other than left frontal headache.  Denies any chest palpitations or chest pain.  No numbness or tingling.  Does not drink alcohol.    Past Medical History:  Diagnosis Date  . Seizures (HCC)    Family History  Problem Relation Age of Onset  . Seizures Mother    Past Surgical History:  Procedure Laterality Date  . SKIN GRAFT     There are no active problems to display for this patient.     Prior to Admission medications   Medication Sig Start Date End Date Taking? Authorizing Provider  divalproex (DEPAKOTE ER) 500 MG 24 hr tablet Take 1 tablet (500 mg total) by mouth daily. 12/05/16   Merrily Brittle, MD  divalproex (DEPAKOTE) 250 MG DR tablet Take 1 tablet (250 mg total) by mouth 2 (two) times daily. 08/16/17 08/16/18  Phineas Semen, MD  lamoTRIgine (LAMICTAL) 200 MG tablet Take 1 tablet (200 mg total) by mouth 2 (two) times daily. 12/05/16   Merrily Brittle, MD    Allergies Patient has no known allergies.    Social History Social History   Tobacco Use  . Smoking status: Never Smoker  . Smokeless tobacco: Current User  Substance Use Topics  . Alcohol use: Yes    Alcohol/week: 2.0 standard drinks    Types: 2 Shots of liquor per week  . Drug use: No    Review of Systems Patient denies headaches, rhinorrhea, blurry vision, numbness, shortness of breath, chest pain, edema, cough,  abdominal pain, nausea, vomiting, diarrhea, dysuria, fevers, rashes or hallucinations unless otherwise stated above in HPI. ____________________________________________   PHYSICAL EXAM:  VITAL SIGNS: Vitals:   06/21/18 1945  Pulse: (!) 110  Resp: 20  Temp: 98.9 F (37.2 C)  SpO2: 97%    Constitutional: Alert and oriented.  Eyes: Conjunctivae are normal.  Head: Contusion and abrasion to left forehead. Nose: No congestion/rhinnorhea. Mouth/Throat: Mucous membranes are moist.   Neck: No stridor. Painless ROM.  Cardiovascular: Normal rate, regular rhythm. Grossly normal heart sounds.  Good peripheral circulation. Respiratory: Normal respiratory effort.  No retractions. Lungs CTAB. Gastrointestinal: Soft and nontender. No distention. No abdominal bruits. No CVA tenderness. Genitourinary:  Musculoskeletal: No lower extremity tenderness nor edema.  No joint effusions. Neurologic:  Normal speech and language. No gross focal neurologic deficits are appreciated. No facial droop Skin:  Skin is warm, dry and intact. No rash noted. Psychiatric: Mood and affect are normal. Speech and behavior are normal.  ____________________________________________   LABS (all labs ordered are listed, but only abnormal results are displayed)  Results for orders placed or performed during the hospital encounter of 06/21/18 (from the past 24 hour(s))  Basic metabolic panel - if new onset seizures     Status: Abnormal   Collection Time: 06/21/18  7:50 PM  Result Value Ref Range   Sodium 143 135 -  145 mmol/L   Potassium 3.4 (L) 3.5 - 5.1 mmol/L   Chloride 106 98 - 111 mmol/L   CO2 21 (L) 22 - 32 mmol/L   Glucose, Bld 125 (H) 70 - 99 mg/dL   BUN 13 6 - 20 mg/dL   Creatinine, Ser 1.61 0.61 - 1.24 mg/dL   Calcium 9.2 8.9 - 09.6 mg/dL   GFR calc non Af Amer >60 >60 mL/min   GFR calc Af Amer >60 >60 mL/min   Anion gap 16 (H) 5 - 15  CBC - if new onset seizures     Status: Abnormal   Collection Time:  06/21/18  7:50 PM  Result Value Ref Range   WBC 5.8 3.8 - 10.6 K/uL   RBC 5.10 4.40 - 5.90 MIL/uL   Hemoglobin 15.6 13.0 - 18.0 g/dL   HCT 04.5 40.9 - 81.1 %   MCV 88.8 80.0 - 100.0 fL   MCH 30.6 26.0 - 34.0 pg   MCHC 34.5 32.0 - 36.0 g/dL   RDW 91.4 (H) 78.2 - 95.6 %   Platelets 299 150 - 440 K/uL   ____________________________________________  EKG My review and personal interpretation at Time: 19:47   Indication: seizure activity  Rate: 106  Rhythm: sinus Axis: normal Other: normal intervals, no stemi ____________________________________________  RADIOLOGY  I personally reviewed all radiographic images ordered to evaluate for the above acute complaints and reviewed radiology reports and findings.  These findings were personally discussed with the patient.  Please see medical record for radiology report.  ____________________________________________   PROCEDURES  Procedure(s) performed:  Procedures    Critical Care performed: no ____________________________________________   INITIAL IMPRESSION / ASSESSMENT AND PLAN / ED COURSE  Pertinent labs & imaging results that were available during my care of the patient were reviewed by me and considered in my medical decision making (see chart for details).   DDX: Seizure, traumatic head injury, fracture, subdural, concussion, electrolyte abnormality, medication noncompliance  Lance Ray is a 47 y.o. who presents to the ED with seizure activity.  CT imaging ordered to evaluate for traumatic injury shows no acute abnormality.  Patient admits to being noncompliant with his Depakote.  Will give repeat dose of Depakote here.  Seizure frequency seems to be very minimal.  Will give prescription for Depakote as well.  Blood work is reassuring.  Stable and appropriate for outpatient follow-up.      As part of my medical decision making, I reviewed the following data within the electronic MEDICAL RECORD NUMBER Nursing notes reviewed  and incorporated, Labs reviewed, notes from prior ED visits and [place] Controlled Substance Database   ____________________________________________   FINAL CLINICAL IMPRESSION(S) / ED DIAGNOSES  Final diagnoses:  Seizure-like activity (HCC)      NEW MEDICATIONS STARTED DURING THIS VISIT:  New Prescriptions   No medications on file     Note:  This document was prepared using Dragon voice recognition software and may include unintentional dictation errors.    Willy Eddy, MD 06/21/18 2036

## 2018-06-21 NOTE — ED Triage Notes (Addendum)
Patient to ER via [facility] from parking lot where patient was found unresponsive. Patient also has hematoma to left forehead. Patient has known history of seizures, has not had his seizure medication for approx 2 months. Last seizure was also 2 months ago. Patient denies any feelings prior to ?seizure. Patient reports feeling tired now, but otherwise well. Per EMS, patient was post-ictal on the scene with no loss of bladder or bowel function.  Patient was previously on Depakote.

## 2019-01-31 ENCOUNTER — Encounter: Payer: Self-pay | Admitting: *Deleted

## 2019-01-31 ENCOUNTER — Emergency Department
Admission: EM | Admit: 2019-01-31 | Discharge: 2019-01-31 | Disposition: A | Discharge status: Home / Self Care | Payer: Self-pay | Attending: Emergency Medicine | Admitting: Emergency Medicine

## 2019-01-31 ENCOUNTER — Other Ambulatory Visit: Payer: Self-pay

## 2019-01-31 DIAGNOSIS — Z79899 Other long term (current) drug therapy: Secondary | ICD-10-CM | POA: Insufficient documentation

## 2019-01-31 DIAGNOSIS — G40909 Epilepsy, unspecified, not intractable, without status epilepticus: Secondary | ICD-10-CM

## 2019-01-31 DIAGNOSIS — R569 Unspecified convulsions: Secondary | ICD-10-CM | POA: Insufficient documentation

## 2019-01-31 LAB — BASIC METABOLIC PANEL
Anion gap: 10 (ref 5–15)
BUN: 16 mg/dL (ref 6–20)
CO2: 26 mmol/L (ref 22–32)
Calcium: 9.1 mg/dL (ref 8.9–10.3)
Chloride: 105 mmol/L (ref 98–111)
Creatinine, Ser: 1.01 mg/dL (ref 0.61–1.24)
GFR calc Af Amer: >60 mL/min (ref >60)
GFR calc non Af Amer: >60 mL/min (ref >60)
Glucose, Bld: 108 mg/dL — ABNORMAL HIGH (ref 70–99)
Potassium: 3.8 mmol/L (ref 3.5–5.1)
Sodium: 141 mmol/L (ref 135–145)

## 2019-01-31 LAB — CBC WITH DIFFERENTIAL/PLATELET
Abs Immature Granulocytes: 0.03 10*3/uL (ref 0.00–0.07)
Basophils Absolute: 0 10*3/uL (ref 0.0–0.1)
Basophils Relative: 0 %
Eosinophils Absolute: 0.1 10*3/uL (ref 0.0–0.5)
Eosinophils Relative: 1 %
HCT: 47.9 % (ref 39.0–52.0)
Hemoglobin: 15.7 g/dL (ref 13.0–17.0)
Immature Granulocytes: 0 %
Lymphocytes Relative: 10 %
Lymphs Abs: 0.8 10*3/uL (ref 0.7–4.0)
MCH: 28.9 pg (ref 26.0–34.0)
MCHC: 32.8 g/dL (ref 30.0–36.0)
MCV: 88.2 fL (ref 80.0–100.0)
Monocytes Absolute: 0.4 10*3/uL (ref 0.1–1.0)
Monocytes Relative: 5 %
Neutro Abs: 7.1 10*3/uL (ref 1.7–7.7)
Neutrophils Relative %: 84 %
Platelets: 323 10*3/uL (ref 150–400)
RBC: 5.43 MIL/uL (ref 4.22–5.81)
RDW: 14.7 % (ref 11.5–15.5)
WBC: 8.5 10*3/uL (ref 4.0–10.5)
nRBC: 0 % (ref 0.0–0.2)

## 2019-01-31 LAB — MAGNESIUM: Magnesium: 2.3 mg/dL (ref 1.7–2.4)

## 2019-01-31 LAB — VALPROIC ACID LEVEL: Valproic Acid Lvl: <10 ug/mL — ABNORMAL LOW (ref 50.0–100.0)

## 2019-01-31 MED ORDER — DIVALPROEX SODIUM ER 500 MG PO TB24: 500.0000 mg | ORAL_TABLET | Freq: Every day | ORAL | 2 refills | Status: DC

## 2019-01-31 MED ORDER — LAMOTRIGINE 100 MG PO TABS
200.0000 mg | ORAL_TABLET | Freq: Once | ORAL | Status: AC
  Administered 2019-01-31: 200 mg via ORAL
  Filled 2019-01-31: qty 2

## 2019-01-31 MED ORDER — DIVALPROEX SODIUM 500 MG PO DR TAB
500.0000 mg | DELAYED_RELEASE_TABLET | Freq: Once | ORAL | Status: AC
  Administered 2019-01-31: 20:00:00 500 mg via ORAL
  Filled 2019-01-31: qty 1

## 2019-01-31 MED ORDER — LAMOTRIGINE 200 MG PO TABS: 200.0000 mg | ORAL_TABLET | Freq: Two times a day (BID) | ORAL | 2 refills | Status: DC

## 2019-01-31 MED ORDER — DIVALPROEX SODIUM 250 MG PO DR TAB: 250.0000 mg | DELAYED_RELEASE_TABLET | Freq: Two times a day (BID) | ORAL | 2 refills | Status: DC

## 2019-01-31 NOTE — Discharge Instructions (Addendum)
You should follow-up with the [place] Medication Assistance Program for help with your seizure medicines. An application is provided for your convenience. Take your medicines as directed. Follow-up with your neurologist or return to the ED as needed.

## 2019-01-31 NOTE — ED Provider Notes (Signed)
[facility] Emergency Department Provider Note ____________________________________________  Time seen: [id number]  I have reviewed the triage vital signs and the nursing notes.  HISTORY  Chief Complaint  Seizures  HPI Lance Ray is a 48 y.o. male presents to the ED for evaluation of seizure-like activity.  Patient was apparently found down in the parking lot of a convenience store as he walked home.  He reports having a seizure and passing out.  Patient reports he awoke to the paramedics assessing him.  He denies any complaints of head injury, neck pain, chest pain, shortness of breath, or weakness.  He admits to being poorly compliant with his seizure medication due to financial constraints.  Patient has been seen in the ED multiple times for similar stories of noncompliance with meds, and witnessed and/or unwitnessed seizure activity while in the streets.  He otherwise denies any cough, congestion, sick contacts, recent travel, or other high risk exposure for COVID.  Past Medical History:  Diagnosis Date  . Seizures (HCC)     There are no active problems to display for this patient.   Past Surgical History:  Procedure Laterality Date  . SKIN GRAFT      Prior to Admission medications   Medication Sig Start Date End Date Taking? Authorizing Provider  divalproex (DEPAKOTE ER) 500 MG 24 hr tablet Take 1 tablet (500 mg total) by mouth daily. 06/21/18   Willy Eddyobinson, Patrick, MD  divalproex (DEPAKOTE) 250 MG DR tablet Take 1 tablet (250 mg total) by mouth 2 (two) times daily. 08/16/17 08/16/18  Phineas SemenGoodman, Graydon, MD  lamoTRIgine (LAMICTAL) 200 MG tablet Take 1 tablet (200 mg total) by mouth 2 (two) times daily. 12/05/16   Merrily Brittleifenbark, Neil, MD    Allergies Patient has no known allergies.  Family History  Problem Relation Age of Onset  . Seizures Mother     Social History Social History   Tobacco Use  . Smoking status: Never Smoker  . Smokeless tobacco:  Current User  Substance Use Topics  . Alcohol use: Yes    Alcohol/week: 2.0 standard drinks    Types: 2 Shots of liquor per week  . Drug use: No    Review of Systems  Constitutional: Negative for fever. Eyes: Negative for visual changes. ENT: Negative for sore throat. Cardiovascular: Negative for chest pain. Respiratory: Negative for shortness of breath. Gastrointestinal: Negative for abdominal pain, vomiting and diarrhea. Genitourinary: Negative for dysuria. Musculoskeletal: Negative for back pain. Skin: Negative for rash. Neurological: Negative for headaches, focal weakness or numbness. Reports seizure as above ____________________________________________  PHYSICAL EXAM:  VITAL SIGNS: ED Triage Vitals  Enc Vitals Group     BP 01/31/19 1908 (!) 136/94     Pulse Rate 01/31/19 1908 86     Resp 01/31/19 1908 20     Temp 01/31/19 1908 98.5 F (36.9 C)     Temp Source 01/31/19 1908 Oral     SpO2 01/31/19 1902 98 %     Weight 01/31/19 1909 180 lb (81.6 kg)     Height 01/31/19 1909 5\' 9"  (1.753 m)     Head Circumference --      Peak Flow --      Pain Score 01/31/19 1909 0     Pain Loc --      Pain Edu? --      Excl. in GC? --     Constitutional: Alert and oriented. Well appearing and in no distress. GCS = 15 Head: Normocephalic and atraumatic.  Eyes: Conjunctivae are normal. PERRL. Normal extraocular movements and fundi bilaterally.  Ears: Canals clear. TMs intact bilaterally. Nose: No congestion/rhinorrhea/epistaxis. Mouth/Throat: Mucous membranes are moist. Neck: Supple. Normal ROM w/o midline tenderness.  Cardiovascular: Normal rate, regular rhythm. Normal distal pulses. Respiratory: Normal respiratory effort. No wheezes/rales/rhonchi. Gastrointestinal: Soft and nontender. No distention. Musculoskeletal: Nontender with normal range of motion in all extremities.  Neurologic:  CN II-XII grossly intact. No post-ictal state. Normal speech and language. No gross focal  neurologic deficits are appreciated. Skin:  Skin is warm, dry and intact. No rash noted. Psychiatric: Mood and affect are normal. Patient exhibits appropriate insight and judgment. ____________________________________________   LABS (pertinent positives/negatives)  Labs Reviewed  BASIC METABOLIC PANEL - Abnormal; Notable for the following components:      Result Value   Glucose, Bld 108 (*)    All other components within normal limits  MAGNESIUM  CBC WITH DIFFERENTIAL/PLATELET  VALPROIC ACID LEVEL  LAMOTRIGINE LEVEL  CBG MONITORING, ED  ____________________________________________  PROCEDURES  Procedures Divalproex DR 500 mg PO Lamotrigine 200 mg PO ____________________________________________  INITIAL IMPRESSION / ASSESSMENT AND PLAN / ED COURSE  Lance Ray was evaluated in Emergency Department on 01/31/2019 for the symptoms described in the history of present illness. He was evaluated in the context of the global COVID-19 pandemic, which necessitated consideration that the patient might be at risk for infection with the SARS-CoV-2 virus that causes COVID-19. Institutional protocols and algorithms that pertain to the evaluation of patients at risk for COVID-19 are in a state of rapid change based on information released by regulatory bodies including the CDC and federal and state organizations. These policies and algorithms were followed during the patient's care in the ED.  Patient with ED evaluation of an apparent witnessed seizure brought in by EMS.  Patient admits to being poorly compliant with his Depakote and or lamotrigine.  He is at previous ED visits with seizure activity due to noncompliance.  His exam is overall benign at this time, he is alert, oriented, without statis epilepitcus, and stable for discharge.  Labs are reassuring overall, seizure med levels are pending at this time, but he has been given a loading dose of his Depakote and Lamictal in the ED.  He will be  discharged with prescriptions, and instructions to follow-up with the [place] [facility], to assist him with his maintenance medication. ____________________________________________  FINAL CLINICAL IMPRESSION(S) / ED DIAGNOSES  Final diagnoses:  Seizure disorder [facility])      Karmen Stabs, Charlesetta Ivory, PA-C 01/31/19 2048    Jeanmarie Plant, MD 01/31/19 2235

## 2019-01-31 NOTE — ED Triage Notes (Signed)
Pt presents via EMS post-seizure, witnessed, unknown down time. Pt post-ictal for EMS but is A&O x 4, although is slow to repond to orientation questions. Pt states he has not been taking Depakote for seizures x 1 month, states he cannot afford the medication. Pt denies that he has health insurance and is not seeing a doctor. Pt states he has family who can help him to access care, but he has not made them aware of his needs.

## 2019-02-04 LAB — LAMOTRIGINE LEVEL: Lamotrigine Lvl: NOT DETECTED ug/mL (ref 2.0–20.0)

## 2019-03-14 ENCOUNTER — Encounter: Payer: Self-pay | Admitting: Emergency Medicine

## 2019-03-14 ENCOUNTER — Other Ambulatory Visit: Payer: Self-pay

## 2019-03-14 ENCOUNTER — Emergency Department
Admission: EM | Admit: 2019-03-14 | Discharge: 2019-03-14 | Disposition: A | Discharge status: Home / Self Care | Payer: Self-pay | Attending: Emergency Medicine | Admitting: Emergency Medicine

## 2019-03-14 DIAGNOSIS — Z79899 Other long term (current) drug therapy: Secondary | ICD-10-CM | POA: Insufficient documentation

## 2019-03-14 DIAGNOSIS — R569 Unspecified convulsions: Secondary | ICD-10-CM | POA: Insufficient documentation

## 2019-03-14 DIAGNOSIS — G40909 Epilepsy, unspecified, not intractable, without status epilepticus: Secondary | ICD-10-CM

## 2019-03-14 LAB — BASIC METABOLIC PANEL
Anion gap: 12 (ref 5–15)
BUN: 11 mg/dL (ref 6–20)
CO2: 20 mmol/L — ABNORMAL LOW (ref 22–32)
Calcium: 8.8 mg/dL — ABNORMAL LOW (ref 8.9–10.3)
Chloride: 107 mmol/L (ref 98–111)
Creatinine, Ser: 1.34 mg/dL — ABNORMAL HIGH (ref 0.61–1.24)
GFR calc Af Amer: >60 mL/min (ref >60)
GFR calc non Af Amer: >60 mL/min (ref >60)
Glucose, Bld: 159 mg/dL — ABNORMAL HIGH (ref 70–99)
Potassium: 2.9 mmol/L — ABNORMAL LOW (ref 3.5–5.1)
Sodium: 139 mmol/L (ref 135–145)

## 2019-03-14 LAB — CBC
HCT: 40.9 % (ref 39.0–52.0)
Hemoglobin: 13.8 g/dL (ref 13.0–17.0)
MCH: 28.6 pg (ref 26.0–34.0)
MCHC: 33.7 g/dL (ref 30.0–36.0)
MCV: 84.9 fL (ref 80.0–100.0)
Platelets: 301 10*3/uL (ref 150–400)
RBC: 4.82 MIL/uL (ref 4.22–5.81)
RDW: 13.8 % (ref 11.5–15.5)
WBC: 5.3 10*3/uL (ref 4.0–10.5)
nRBC: 0 % (ref 0.0–0.2)

## 2019-03-14 MED ORDER — DIVALPROEX SODIUM ER 500 MG PO TB24: 500.0000 mg | ORAL_TABLET | Freq: Every day | ORAL | 0 refills | Status: DC

## 2019-03-14 MED ORDER — POTASSIUM CHLORIDE 10 MEQ/100ML IV SOLN
10.0000 meq | Freq: Once | INTRAVENOUS | Status: DC
  Filled 2019-03-14: qty 100

## 2019-03-14 MED ORDER — DIVALPROEX SODIUM ER 250 MG PO TB24
500.0000 mg | ORAL_TABLET | ORAL | Status: DC
  Filled 2019-03-14: qty 2

## 2019-03-14 MED ORDER — POTASSIUM CHLORIDE CRYS ER 20 MEQ PO TBCR
40.0000 meq | EXTENDED_RELEASE_TABLET | Freq: Once | ORAL | Status: DC
  Filled 2019-03-14: qty 2

## 2019-03-14 MED ORDER — MAGNESIUM SULFATE IN D5W 1-5 GM/100ML-% IV SOLN
1.0000 g | Freq: Once | INTRAVENOUS | Status: DC
  Filled 2019-03-14: qty 100

## 2019-03-14 NOTE — ED Provider Notes (Signed)
[facility] Emergency Department Provider Note  ____________________________________________   First MD Initiated Contact with Patient 03/14/19 1608     (approximate)  I have reviewed the triage vital signs and the nursing notes.   HISTORY  Chief Complaint Seizures    HPI Lance Ray is a 48 y.o. male here for evaluation after he had a seizure  Patient reports he has a long history of seizures, his last seizure was about 6 months ago.  When I entered the room he actually said that he is very ago, he really did not feel like he needed to come to the hospital as he has a longstanding history of seizures and has been about every 6 months  He denies is been injured.  He is able to tell me month a year situation does not recall actually having the seizure itself but recalls waking up and knowing that he felt like he was going to have a seizure and then waking up with paramedics  He has not been on any seizure medication for "a long time" and reports that he lost his job and he is gone for months or longer may be a year without his medication  He is not been sick he is recently.  No fevers or chills.  No exposure anyone with coronavirus  No headache.  No neck pain.  No chest pain or trouble breathing.  Reports he really does not want a wait for anything just wants to be able to get go and is he has a history of seizures happened many times and he does not really feel like he wants to be here  I spent time encouraged the patient we should at least try to restart him on his seizure medicine reviewed with him and he is willing to start back on his Depakote ER and he also reports that he was given information on the medication assistance clinic and is willing to follow-up with them with a prescription   Past Medical History:  Diagnosis Date  . Seizures ([place])     There are no active problems to display for this patient.   Past Surgical History:  Procedure  Laterality Date  . SKIN GRAFT      Prior to Admission medications   Medication Sig Start Date End Date Taking? Authorizing Provider  lamoTRIgine (LAMICTAL) 200 MG tablet Take 1 tablet (200 mg total) by mouth 2 (two) times daily. 01/31/19 05/01/19 Yes Menshew, Dannielle Karvonen, PA-C  divalproex (DEPAKOTE ER) 500 MG 24 hr tablet Take 1 tablet (500 mg total) by mouth daily for 30 days. 03/14/19 04/13/19  Delman Kitten, MD    Allergies Patient has no known allergies.  Family History  Problem Relation Age of Onset  . Seizures Mother     Social History Social History   Tobacco Use  . Smoking status: Never Smoker  . Smokeless tobacco: Current User  Substance Use Topics  . Alcohol use: Yes    Alcohol/week: 2.0 standard drinks    Types: 2 Shots of liquor per week  . Drug use: No    Review of Systems Constitutional: No fever/chills Eyes: No visual changes. ENT: No sore throat. Cardiovascular: Denies chest pain. Respiratory: Denies shortness of breath. Gastrointestinal: No abdominal pain.   Genitourinary: Negative for dysuria. Musculoskeletal: Negative for back pain. Skin: Negative for rash. Neurological: Negative for headaches, areas of focal weakness or numbness.  See HPI, positive for seizure.  Last seizure prior to that about 6  months ago  Denies heavy alcohol use.  Denies being in withdrawals.  Does not drink regularly any longer   ____________________________________________   PHYSICAL EXAM:  VITAL SIGNS: ED Triage Vitals  Enc Vitals Group     BP 03/14/19 1603 127/88     Pulse Rate 03/14/19 1603 (!) 109     Resp 03/14/19 1603 12     Temp 03/14/19 1603 98.7 F (37.1 C)     Temp Source 03/14/19 1603 Oral     SpO2 03/14/19 1603 96 %     Weight 03/14/19 1608 180 lb (81.6 kg)     Height 03/14/19 1608 5\' 9"  (1.753 m)     Head Circumference --      Peak Flow --      Pain Score 03/14/19 1603 0     Pain Loc --      Pain Edu? --      Excl. in GC? --     Constitutional:  Alert and oriented. Well appearing and in no acute distress.  He is pleasant Eyes: Conjunctivae are normal. Head: Atraumatic. Nose: No congestion/rhinnorhea. Mouth/Throat: Mucous membranes are moist. Neck: No stridor.  Cardiovascular: Normal rate, regular rhythm. Grossly normal heart sounds.  Good peripheral circulation. Respiratory: Normal respiratory effort.  No retractions. Lungs CTAB. Gastrointestinal: Soft and nontender. No distention. Musculoskeletal: No lower extremity tenderness nor edema.  No pronator drift in extremity.  5 out of 5 strength in all extremities. Neurologic:  Normal speech and language. No gross focal neurologic deficits are appreciated.  Normal cranial nerve exam.  Fully alert fully oriented appropriate. Skin:  Skin is warm, dry and intact. No rash noted. Psychiatric: Mood and affect are normal. Speech and behavior are normal.  ____________________________________________   LABS (all labs ordered are listed, but only abnormal results are displayed)  Labs Reviewed  CBC  BASIC METABOLIC PANEL  CBG MONITORING, ED   ____________________________________________  EKG   ____________________________________________  RADIOLOGY   ____________________________________________   PROCEDURES  Procedure(s) performed: None  Procedures  Critical Care performed: No  ____________________________________________   INITIAL IMPRESSION / ASSESSMENT AND PLAN / ED COURSE  Pertinent labs & imaging results that were available during my care of the patient were reviewed by me and considered in my medical decision making (see chart for details).   Patiently had a witnessed seizure.  Has a history of seizures.  Not on medication.  Patient really does not wish for further evaluation but willing to stay for at least starting his medication back and excepting of a prescription.  I discussed with him and we utilize shared medical decision-making to discuss his wishes, and  his wishes that he wishes to be able to go home as soon as possible with medication.  He does not feel that he wishes to stay for results of his lab testing today.  Try to encourage him to stay, but he reports he just knows what happens is happened many a time and he just needs to get back on his medications.  We will provide him a prescription for a month and also follow-up with neurology Dr. Clelia CroftShaw.  Lance Ray was evaluated in Emergency Department on 03/14/2019 for the symptoms described in the history of present illness. He was evaluated in the context of the global COVID-19 pandemic, which necessitated consideration that the patient might be at risk for infection with the SARS-CoV-2 virus that causes COVID-19. Institutional protocols and algorithms that pertain to the evaluation of patients at risk for  COVID-19 are in a state of rapid change based on information released by regulatory bodies including the CDC and federal and state organizations. These policies and algorithms were followed during the patient's care in the ED.  Return precautions and treatment recommendations and follow-up discussed with the patient who is agreeable with the plan.       ____________________________________________   FINAL CLINICAL IMPRESSION(S) / ED DIAGNOSES  Final diagnoses:  Seizure disorder [facility](HCC)        Note:  This document was prepared using Dragon voice recognition software and may include unintentional dictation errors       Sharyn CreamerQuale, Mark, MD 03/14/19 1637

## 2019-03-14 NOTE — Discharge Instructions (Addendum)

## 2019-03-14 NOTE — ED Notes (Signed)
Patient left without receiving medications and without receiving discharge paperwork/prescriptions and RN verbal discharge instructions.  Patient did receive verbal discharge instructions from MD.  This RN called and left a HIPAA compliant message on patient's cellphone to notify of discharge papers and prescription.

## 2019-03-14 NOTE — ED Triage Notes (Signed)
Patient presents to the ED via EMS from the side of the road.  Patient was walking from the grocery store home when people mowing their lawn witnessed patient fall and appear to have a seizure.  Patient has history of seizures.  Patient states he has not taken his seizure medication in several weeks due to finances.  This RN discussed the medication management clinic with patient.  Patient is alert and oriented x 4 at this time.  Per EMS patient was lethargic on scene but steadily became more alert.  Patient denies any pain at this time.

## 2019-06-05 ENCOUNTER — Emergency Department
Admission: EM | Admit: 2019-06-05 | Discharge: 2019-06-06 | Disposition: A | Discharge status: Home / Self Care | Payer: Self-pay | Attending: Emergency Medicine | Admitting: Emergency Medicine

## 2019-06-05 ENCOUNTER — Emergency Department
Admission: EM | Admit: 2019-06-05 | Discharge: 2019-06-05 | Disposition: A | Discharge status: Home / Self Care | Payer: Self-pay | Attending: Emergency Medicine | Admitting: Emergency Medicine

## 2019-06-05 ENCOUNTER — Other Ambulatory Visit: Payer: Self-pay

## 2019-06-05 DIAGNOSIS — R569 Unspecified convulsions: Secondary | ICD-10-CM | POA: Insufficient documentation

## 2019-06-05 DIAGNOSIS — E876 Hypokalemia: Secondary | ICD-10-CM | POA: Insufficient documentation

## 2019-06-05 DIAGNOSIS — F17290 Nicotine dependence, other tobacco product, uncomplicated: Secondary | ICD-10-CM | POA: Insufficient documentation

## 2019-06-05 DIAGNOSIS — Z9114 Patient's other noncompliance with medication regimen: Secondary | ICD-10-CM | POA: Insufficient documentation

## 2019-06-05 MED ORDER — DIVALPROEX SODIUM ER 250 MG PO TB24
1000.0000 mg | ORAL_TABLET | ORAL | Status: AC
  Administered 2019-06-05: 1000 mg via ORAL
  Filled 2019-06-05: qty 4

## 2019-06-05 MED ORDER — DIVALPROEX SODIUM ER 500 MG PO TB24: 500.0000 mg | ORAL_TABLET | Freq: Every day | ORAL | 0 refills | Status: DC

## 2019-06-05 NOTE — ED Triage Notes (Signed)
Pt arrives to ED from sidewalk near home via [facility] EMS with c/c of seizure like activity. EMS states that they were called by a passer by and found him sitting on the sidewalk. Pt states he remembers leaving his house on foot to go get something to eat and the next thing he remembers is the ambulance crew around him. EMS reports him being mildly post ictal at time of exam. Vitals reported as 160/90, 100% on room air, CBG 109. 18G placed in R wrist. Upon arrival, pt A&Ox4, NAD, no respiratory Sx evident. No evident injury from fall. Pt states he takes depakote for seizures but has not taken it for several weeks.

## 2019-06-05 NOTE — ED Provider Notes (Signed)
[facility] Emergency Department Provider Note  ____________________________________________  Time seen: Approximately 10:27 PM  I have reviewed the triage vital signs and the nursing notes.   HISTORY  Chief Complaint Seizures    HPI Lance Ray is a 48 y.o. male with a history of seizures he reports not taking his medications due to financial burden.  He has been off his medicines for a couple of months.  He gets occasional seizures.  Reports he has been in his usual state of health, no recent trauma illness or serious alcohol or drug intake, when he apparently had a seizure today.  He states that he feels fine and wants to go home.  No specific aggravating or alleviating factors.  Feels back to normal.  He is agreeable to restarting his medicine.      Past Medical History:  Diagnosis Date  . Seizures ([place])      There are no active problems to display for this patient.    Past Surgical History:  Procedure Laterality Date  . SKIN GRAFT       Prior to Admission medications   Medication Sig Start Date End Date Taking? Authorizing Provider  divalproex (DEPAKOTE ER) 500 MG 24 hr tablet Take 1 tablet (500 mg total) by mouth daily. 06/05/19 07/05/19  Carrie Mew, MD  lamoTRIgine (LAMICTAL) 200 MG tablet Take 1 tablet (200 mg total) by mouth 2 (two) times daily. 01/31/19 05/01/19  Menshew, Dannielle Karvonen, PA-C     Allergies Patient has no known allergies.   Family History  Problem Relation Age of Onset  . Seizures Mother     Social History Social History   Tobacco Use  . Smoking status: Never Smoker  . Smokeless tobacco: Current User  Substance Use Topics  . Alcohol use: Yes    Alcohol/week: 2.0 standard drinks    Types: 2 Shots of liquor per week  . Drug use: No    Review of Systems  Constitutional:   No fever or chills.  ENT:   No sore throat. No rhinorrhea. Cardiovascular:   No chest pain or syncope. Respiratory:   No  dyspnea or cough. Gastrointestinal:   Negative for abdominal pain, vomiting and diarrhea.  Musculoskeletal:   Negative for focal pain or swelling All other systems reviewed and are negative except as documented above in ROS and HPI.  ____________________________________________   PHYSICAL EXAM:  VITAL SIGNS: ED Triage Vitals  Enc Vitals Group     BP 06/05/19 2030 (!) 145/97     Pulse Rate 06/05/19 2030 (!) 103     Resp 06/05/19 2030 13     Temp 06/05/19 2044 98.4 F (36.9 C)     Temp Source 06/05/19 2044 Oral     SpO2 06/05/19 2030 98 %     Weight 06/05/19 2033 170 lb (77.1 kg)     Height 06/05/19 2033 5\' 9"  (1.753 m)     Head Circumference --      Peak Flow --      Pain Score 06/05/19 2033 0     Pain Loc --      Pain Edu? --      Excl. in [place]? --     Vital signs reviewed, nursing assessments reviewed.   Constitutional:   Alert and oriented. Non-toxic appearance.  Sitting upright, drinking fluids Eyes:   Conjunctivae are normal. EOMI. PERRL. ENT      Head:   Normocephalic and atraumatic.  Nose:   No congestion/rhinnorhea.       Mouth/Throat:   MMM, no pharyngeal erythema. No peritonsillar mass.       Neck:   No meningismus. Full ROM. Hematological/Lymphatic/Immunilogical:   No cervical lymphadenopathy. Cardiovascular:   RRR. Symmetric bilateral radial and DP pulses.  No murmurs. Cap refill less than 2 seconds. Respiratory:   Normal respiratory effort without tachypnea/retractions. Breath sounds are clear and equal bilaterally. No wheezes/rales/rhonchi. Gastrointestinal:   Soft and nontender. Non distended. There is no CVA tenderness.  No rebound, rigidity, or guarding.  Musculoskeletal:   Normal range of motion in all extremities. No joint effusions.  No lower extremity tenderness.  No edema. Neurologic:   Normal speech and language.  Cranial nerves III through XII intact Motor grossly intact. No acute focal neurologic deficits are appreciated.  Skin:    Skin is  warm, dry and intact. No rash noted.  No petechiae, purpura, or bullae.  ____________________________________________    LABS (pertinent positives/negatives) (all labs ordered are listed, but only abnormal results are displayed) Labs Reviewed  VALPROIC ACID LEVEL   ____________________________________________   EKG  Interpreted by me Sinus rhythm rate of 99, normal axis intervals QRS ST segments and T waves  ____________________________________________    RADIOLOGY  No results found.  ____________________________________________   PROCEDURES Procedures  ____________________________________________    CLINICAL IMPRESSION / ASSESSMENT AND PLAN / ED COURSE  Medications ordered in the ED: Medications  divalproex (DEPAKOTE ER) 24 hr tablet 1,000 mg (1,000 mg Oral Given 06/05/19 2156)    Pertinent labs & imaging results that were available during my care of the patient were reviewed by me and considered in my medical decision making (see chart for details).  Lance Ray was evaluated in Emergency Department on 06/05/2019 for the symptoms described in the history of present illness. He was evaluated in the context of the global COVID-19 pandemic, which necessitated consideration that the patient might be at risk for infection with the SARS-CoV-2 virus that causes COVID-19. Institutional protocols and algorithms that pertain to the evaluation of patients at risk for COVID-19 are in a state of rapid change based on information released by regulatory bodies including the CDC and federal and state organizations. These policies and algorithms were followed during the patient's care in the ED.   Patient presents after a seizure likely due to medication noncompliance.  Wishes to be discharged right away.  I will give him a gram of Depakote, wrote a new prescription for his daily Depakote.  I will hold off on Lamictal for now given that he is unlikely to be able to afford both and  Lamictal is higher risk for starting and stopping.  Recommend follow-up with primary care.  Doubt serious dehydration, toxidrome, electrolyte abnormality or hypoglycemia.      ____________________________________________   FINAL CLINICAL IMPRESSION(S) / ED DIAGNOSES    Final diagnoses:  Seizure [facility](HCC)     ED Discharge Orders         Ordered    divalproex (DEPAKOTE ER) 500 MG 24 hr tablet  Daily     06/05/19 2227          Portions of this note were generated with dragon dictation software. Dictation errors may occur despite best attempts at proofreading.   Sharman CheekStafford, Zailynn Brandel, MD 06/05/19 2245

## 2019-06-05 NOTE — ED Triage Notes (Signed)
Pt was just discharged and had a seizure again.  On arrival to treatment room, pt awake and oriented.  Iv started.

## 2019-06-06 ENCOUNTER — Other Ambulatory Visit: Payer: Self-pay

## 2019-06-06 ENCOUNTER — Encounter: Payer: Self-pay | Admitting: *Deleted

## 2019-06-06 LAB — VALPROIC ACID LEVEL: Valproic Acid Lvl: 15 ug/mL — ABNORMAL LOW (ref 50.0–100.0)

## 2019-06-06 LAB — BASIC METABOLIC PANEL
Anion gap: 10 (ref 5–15)
BUN: 10 mg/dL (ref 6–20)
CO2: 25 mmol/L (ref 22–32)
Calcium: 9 mg/dL (ref 8.9–10.3)
Chloride: 107 mmol/L (ref 98–111)
Creatinine, Ser: 0.96 mg/dL (ref 0.61–1.24)
GFR calc Af Amer: >60 mL/min (ref >60)
GFR calc non Af Amer: >60 mL/min (ref >60)
Glucose, Bld: 112 mg/dL — ABNORMAL HIGH (ref 70–99)
Potassium: 3.1 mmol/L — ABNORMAL LOW (ref 3.5–5.1)
Sodium: 142 mmol/L (ref 135–145)

## 2019-06-06 LAB — CBC
HCT: 41.4 % (ref 39.0–52.0)
Hemoglobin: 13.7 g/dL (ref 13.0–17.0)
MCH: 29 pg (ref 26.0–34.0)
MCHC: 33.1 g/dL (ref 30.0–36.0)
MCV: 87.7 fL (ref 80.0–100.0)
Platelets: 289 10*3/uL (ref 150–400)
RBC: 4.72 MIL/uL (ref 4.22–5.81)
RDW: 13.9 % (ref 11.5–15.5)
WBC: 5.4 10*3/uL (ref 4.0–10.5)
nRBC: 0 % (ref 0.0–0.2)

## 2019-06-06 MED ORDER — POTASSIUM CHLORIDE CRYS ER 20 MEQ PO TBCR
40.0000 meq | EXTENDED_RELEASE_TABLET | Freq: Once | ORAL | Status: AC
  Administered 2019-06-06: 40 meq via ORAL

## 2019-06-06 NOTE — ED Notes (Signed)
Pt alert   nsr on monitor.   

## 2019-06-06 NOTE — ED Provider Notes (Signed)
[facility] Emergency Department Provider Note  ____________________________________________   I have reviewed the triage vital signs and the nursing notes.   HISTORY  Chief Complaint Seizures   History limited by: Not Limited   HPI Lance Ray is a 48 y.o. male who presents to the emergency department today because of concern for seizure. The patient was seen in the emergency department earlier today because of concern for a seizure. On his way home he says he had another seizure. Last seizure before today was roughly 4 months ago. Says he also last took his medication for seizures roughly 4 months ago. Has not been able to afford his medications. Denies any recent illness. Denies any recent alcohol use or sleep deprivation.   Records reviewed. Per medical record review patient was seen in the emergency department just a couple of hours ago after a seizure. Patient was given a gram of Depakote.   Past Medical History:  Diagnosis Date  . Seizures ([place])    There are no active problems to display for this patient.  Past Surgical History:  Procedure Laterality Date  . SKIN GRAFT     Prior to Admission medications   Medication Sig Start Date End Date Taking? Authorizing Provider  divalproex (DEPAKOTE ER) 500 MG 24 hr tablet Take 1 tablet (500 mg total) by mouth daily. 06/05/19 07/05/19  Carrie Mew, MD  lamoTRIgine (LAMICTAL) 200 MG tablet Take 1 tablet (200 mg total) by mouth 2 (two) times daily. 01/31/19 05/01/19  Menshew, Dannielle Karvonen, PA-C   Allergies Patient has no known allergies.  Family History  Problem Relation Age of Onset  . Seizures Mother    Social History Social History   Tobacco Use  . Smoking status: Never Smoker  . Smokeless tobacco: Current User  Substance Use Topics  . Alcohol use: Yes    Alcohol/week: 2.0 standard drinks    Types: 2 Shots of liquor per week  . Drug use: No   Review of Systems Constitutional: No  fever/chills Eyes: No visual changes. ENT: No sore throat. Cardiovascular: Denies chest pain. Respiratory: Denies shortness of breath. Gastrointestinal: No abdominal pain.  No nausea, no vomiting.  No diarrhea.   Genitourinary: Negative for dysuria. Musculoskeletal: Negative for back pain. Skin: Negative for rash. Neurological: Positive for seizure  ____________________________________________   PHYSICAL EXAM:  VITAL SIGNS: ED Triage Vitals  Enc Vitals Group     BP 06/06/19 0001 (!) 164/114     Pulse Rate 06/06/19 0001 (!) 108     Resp 06/06/19 0001 20     Temp 06/06/19 0001 98.3 F (36.8 C)     Temp Source 06/06/19 0001 Oral     SpO2 06/06/19 0001 97 %     Weight 06/06/19 0002 170 lb (77.1 kg)     Height 06/06/19 0002 5\' 9"  (1.753 m)   Constitutional: Alert and oriented.  Eyes: Conjunctivae are normal.  ENT      Head: Normocephalic and atraumatic.      Nose: No congestion/rhinnorhea.      Mouth/Throat: Mucous membranes are moist.      Neck: No stridor. Hematological/Lymphatic/Immunilogical: No cervical lymphadenopathy. Cardiovascular: Normal rate, regular rhythm.  No murmurs, rubs, or gallops.  Respiratory: Normal respiratory effort without tachypnea nor retractions. Breath sounds are clear and equal bilaterally. No wheezes/rales/rhonchi. Gastrointestinal: Soft and non tender. No rebound. No guarding.  Genitourinary: Deferred Musculoskeletal: Normal range of motion in all extremities. No lower extremity edema. Neurologic:  Normal speech  and language. No gross focal neurologic deficits are appreciated.  Skin:  Skin is warm, dry and intact. No rash noted. Psychiatric: Mood and affect are normal. Speech and behavior are normal. Patient exhibits appropriate insight and judgment.  ____________________________________________    LABS (pertinent positives/negatives)  CBC wbc 5.4, hgb 13.7, plt 289 BMP wnl except k 3.1, glu  112 ____________________________________________   EKG  None  ____________________________________________    RADIOLOGY  None  ____________________________________________   PROCEDURES  Procedures  ____________________________________________   INITIAL IMPRESSION / ASSESSMENT AND PLAN / ED COURSE  Pertinent labs & imaging results that were available during my care of the patient were reviewed by me and considered in my medical decision making (see chart for details).   Patient re-presented to the emergency department today because of concerns for recurrent seizure.  Had been seen just a few hours previously for seizure.  Patient's blood work did show hypokalemia.  Sodium within normal limits.  I did have a discussion I first evaluated the patient that we would like to monitor him for a number of hours. After two hours patient decided he would like to go home. Discussed with the patient not to drive or perform activities that would cause issues if he was to have another seizure.   ____________________________________________   FINAL CLINICAL IMPRESSION(S) / ED DIAGNOSES  Final diagnoses:  Seizure [facility](HCC)     Note: This dictation was prepared with Dragon dictation. Any transcriptional errors that result from this process are unintentional     Phineas SemenGoodman, Sophira Rumler, MD 06/06/19 [phone]

## 2019-06-06 NOTE — ED Notes (Signed)
ED Provider at bedside. 

## 2019-06-06 NOTE — Discharge Instructions (Addendum)
Please do not drive, go in a pool, go up a ladder or perform any activity that would be dangerous to yourself or others if you were to have another seizure.

## 2019-07-26 ENCOUNTER — Emergency Department
Admission: EM | Admit: 2019-07-26 | Discharge: 2019-07-26 | Disposition: A | Discharge status: Home / Self Care | Payer: Self-pay | Attending: Emergency Medicine | Admitting: Emergency Medicine

## 2019-07-26 ENCOUNTER — Emergency Department: Payer: Self-pay

## 2019-07-26 ENCOUNTER — Other Ambulatory Visit: Payer: Self-pay

## 2019-07-26 ENCOUNTER — Encounter: Payer: Self-pay | Admitting: Emergency Medicine

## 2019-07-26 DIAGNOSIS — Z79899 Other long term (current) drug therapy: Secondary | ICD-10-CM | POA: Insufficient documentation

## 2019-07-26 DIAGNOSIS — R569 Unspecified convulsions: Secondary | ICD-10-CM | POA: Insufficient documentation

## 2019-07-26 LAB — BASIC METABOLIC PANEL
Anion gap: 13 (ref 5–15)
BUN: 11 mg/dL (ref 6–20)
CO2: 20 mmol/L — ABNORMAL LOW (ref 22–32)
Calcium: 8.6 mg/dL — ABNORMAL LOW (ref 8.9–10.3)
Chloride: 108 mmol/L (ref 98–111)
Creatinine, Ser: 0.88 mg/dL (ref 0.61–1.24)
GFR calc Af Amer: >60 mL/min (ref >60)
GFR calc non Af Amer: >60 mL/min (ref >60)
Glucose, Bld: 114 mg/dL — ABNORMAL HIGH (ref 70–99)
Potassium: 3.6 mmol/L (ref 3.5–5.1)
Sodium: 141 mmol/L (ref 135–145)

## 2019-07-26 LAB — CBC WITH DIFFERENTIAL/PLATELET
Abs Immature Granulocytes: 0.07 10*3/uL (ref 0.00–0.07)
Basophils Absolute: 0 10*3/uL (ref 0.0–0.1)
Basophils Relative: 0 %
Eosinophils Absolute: 0 10*3/uL (ref 0.0–0.5)
Eosinophils Relative: 1 %
HCT: 38.6 % — ABNORMAL LOW (ref 39.0–52.0)
Hemoglobin: 12.8 g/dL — ABNORMAL LOW (ref 13.0–17.0)
Immature Granulocytes: 1 %
Lymphocytes Relative: 15 %
Lymphs Abs: 1.1 10*3/uL (ref 0.7–4.0)
MCH: 29.2 pg (ref 26.0–34.0)
MCHC: 33.2 g/dL (ref 30.0–36.0)
MCV: 87.9 fL (ref 80.0–100.0)
Monocytes Absolute: 0.5 10*3/uL (ref 0.1–1.0)
Monocytes Relative: 7 %
Neutro Abs: 5.5 10*3/uL (ref 1.7–7.7)
Neutrophils Relative %: 76 %
Platelets: 291 10*3/uL (ref 150–400)
RBC: 4.39 MIL/uL (ref 4.22–5.81)
RDW: 14.8 % (ref 11.5–15.5)
WBC: 7.2 10*3/uL (ref 4.0–10.5)
nRBC: 0 % (ref 0.0–0.2)

## 2019-07-26 MED ORDER — DIVALPROEX SODIUM ER 250 MG PO TB24: 1000.0000 mg | ORAL_TABLET | Freq: Once | ORAL | Status: DC

## 2019-07-26 MED ORDER — DIVALPROEX SODIUM ER 250 MG PO TB24
500.0000 mg | ORAL_TABLET | Freq: Every day | ORAL | Status: DC
  Administered 2019-07-26: 500 mg via ORAL
  Filled 2019-07-26 (×2): qty 2

## 2019-07-26 MED ORDER — DIVALPROEX SODIUM ER 500 MG PO TB24: 500.0000 mg | ORAL_TABLET | Freq: Every day | ORAL | 0 refills | Status: DC

## 2019-07-26 NOTE — ED Triage Notes (Signed)
Pt presents via acems with c/o witnessed seizure. Pt was at gas station when he fell and had witnessed seizure lasting about 30 seconds. Pt currently lethargic, but alert and oriented x4. Pt has hx of seizures, but has not taken Depakote for about 2 months. Pt denies blood thinners. BS 117. 18G in lac placed by ems.

## 2019-07-26 NOTE — Social Work (Signed)
TOC CM/SW consult received. Patient not able to afford medication cost.  Unemployed. SW met with pt at ED bedside.  Provided patient with resources for low cost medical care, primary care provider, and low cost local pharmacy.  Urged pt to establish care with primary care provider in the area.    Berenice Bouton, MSW, LCSW  [phone] 8am-6pm (weekends) or CSW ED # [phone]

## 2019-07-26 NOTE — Discharge Instructions (Addendum)
You came in with a seizure.  Your CT head and labs were reassuring.  You need to follow-up with the resources social work gave you to help get restarted on medications so that you not have a seizure that causes a worse outcome.  You cannot drive due to seizures.

## 2019-07-26 NOTE — ED Provider Notes (Signed)
[facility] Emergency Department Provider Note  ____________________________________________   First MD Initiated Contact with Patient 07/26/19 1651     (approximate)  I have reviewed the triage vital signs and the nursing notes.   HISTORY  Chief Complaint Seizures    HPI Lance Ray is a 48 y.o. male with seizure history who presents with concern for seizures.  Patient supposed be on Depakote patient was last seen on 9/10 for seizures.  Patient is supposed be on 500 mg 1 tablet by mouth daily.  Patient is been unable to fill his Depakote due to the cost.  Patient has multiple visits to the ER for seizures.  The seizure was witnessed at a gas station.  He thinks he did hit his head.  It was generalized tonic-clonic and lasted about 30 seconds and stopped on its own without any interventions, brought on by not taking his medications.  Denies fevers or alcohol use.  Post ictal with EMS but getting better on his own.  Did not bite his tongue.    Past Medical History:  Diagnosis Date  . Seizures ([place])     There are no active problems to display for this patient.   Past Surgical History:  Procedure Laterality Date  . SKIN GRAFT      Prior to Admission medications   Medication Sig Start Date End Date Taking? Authorizing Provider  divalproex (DEPAKOTE ER) 500 MG 24 hr tablet Take 1 tablet (500 mg total) by mouth daily. 06/05/19 07/05/19  Carrie Mew, MD  lamoTRIgine (LAMICTAL) 200 MG tablet Take 1 tablet (200 mg total) by mouth 2 (two) times daily. 01/31/19 05/01/19  Menshew, Dannielle Karvonen, PA-C    Allergies Patient has no known allergies.  Family History  Problem Relation Age of Onset  . Seizures Mother     Social History Social History   Tobacco Use  . Smoking status: Never Smoker  . Smokeless tobacco: Current User  Substance Use Topics  . Alcohol use: Yes    Alcohol/week: 2.0 standard drinks    Types: 2 Shots of liquor per week   . Drug use: No      Review of Systems Constitutional: No fever/chills Eyes: No visual changes. ENT: No sore throat. Cardiovascular: Denies chest pain. Respiratory: Denies shortness of breath. Gastrointestinal: No abdominal pain.  No nausea, no vomiting.  No diarrhea.  No constipation. Genitourinary: Negative for dysuria. Musculoskeletal: Negative for back pain. Skin: Negative for rash. Neurological: Negative for headaches, focal weakness or numbness.  Seizure activity All other ROS negative ____________________________________________   PHYSICAL EXAM:  VITAL SIGNS: Blood pressure (!) 136/98, pulse (!) 108, temperature 98.5 F (36.9 C), temperature source Oral, resp. rate 17, height 5\' 9"  (1.753 m), weight 86.2 kg, SpO2 98 %.  Constitutional: Alert and oriented. Well appearing and in no acute distress. Eyes: Conjunctivae are normal. EOMI. Head: Atraumatic. Nose: No congestion/rhinnorhea. Mouth/Throat: Mucous membranes are moist.  No evidence of tongue laceration Neck: No stridor. Trachea Midline. FROM Cardiovascular: Tachycardic, regular rhythm. Grossly normal heart sounds.  Good peripheral circulation. Respiratory: Normal respiratory effort.  No retractions. Lungs CTAB. Gastrointestinal: Soft and nontender. No distention. No abdominal bruits.  Musculoskeletal: No lower extremity tenderness nor edema.  No joint effusions. Neurologic:  Normal speech and language. No gross focal neurologic deficits are appreciated.  Skin:  Skin is warm, dry and intact. No rash noted. Psychiatric: Mood and affect are normal. Speech and behavior are normal. GU: Deferred   ____________________________________________  LABS (all labs ordered are listed, but only abnormal results are displayed)  Labs Reviewed  CBC WITH DIFFERENTIAL/PLATELET - Abnormal; Notable for the following components:      Result Value   Hemoglobin 12.8 (*)    HCT 38.6 (*)    All other components within normal  limits  BASIC METABOLIC PANEL - Abnormal; Notable for the following components:   CO2 20 (*)    Glucose, Bld 114 (*)    Calcium 8.6 (*)    All other components within normal limits   ____________________________________________   ED ECG REPORT I, Concha SeMary E [name], the attending physician, personally viewed and interpreted this ECG.  EKG is normal sinus rate of 98, no ST elevation, no T wave inversion, normal intervals ____________________________________________  RADIOLOGY Vela ProseI, Shamel Germond E Cailen Texeira, personally viewed and evaluated these images (plain radiographs) as part of my medical decision making, as well as reviewing the written report by the radiologist.  ED MD interpretation: CT head negative for ICH.   Official radiology report(s): Ct Head Wo Contrast  Result Date: 07/26/2019 CLINICAL DATA:  Unexplained altered level of consciousness, witnessed seizure, fell, history of seizure disorder EXAM: CT HEAD WITHOUT CONTRAST TECHNIQUE: Contiguous axial images were obtained from the base of the skull through the vertex without intravenous contrast. Sagittal and coronal MPR images reconstructed from axial data set. Normal ventricular morphology. No midline shift or mass effect. COMPARISON:  06/21/2018 FINDINGS: Brain: Normal ventricular morphology. No midline shift or mass effect. Minimal small vessel chronic ischemic changes of deep cerebral white matter. Lucencies at the inferior basal ganglia favor prominent perivascular spaces less likely remote lacunar infarcts. No intracranial hemorrhage, mass lesion or evidence of acute infarction. No extra-axial fluid collections. Vascular: Unremarkable Skull: Intact Sinuses/Orbits: Clear Other: N/A IMPRESSION: Minimal small vessel chronic ischemic changes of deep cerebral white matter. No acute intracranial abnormalities. Electronically Signed   By: Ulyses SouthwardMark  Boles M.D.   On: 07/26/2019 17:47     ____________________________________________   PROCEDURES  Procedure(s) performed (including Critical Care):  Procedures   ____________________________________________   INITIAL IMPRESSION / ASSESSMENT AND PLAN / ED COURSE  Lance Ray was evaluated in Emergency Department on 07/26/2019 for the symptoms described in the history of present illness. He was evaluated in the context of the global COVID-19 pandemic, which necessitated consideration that the patient might be at risk for infection with the SARS-CoV-2 virus that causes COVID-19. Institutional protocols and algorithms that pertain to the evaluation of patients at risk for COVID-19 are in a state of rapid change based on information released by regulatory bodies including the CDC and federal and state organizations. These policies and algorithms were followed during the patient's care in the ED.    Patient presents with seizure.  Patient is unsure if he did hit his head.  Will get CT head to evaluate for intracranial hemorrhage.  No C-spine tenderness to suggest cervical fracture.  Will get basic labs to evaluate for electrolyte abnormalities, AKI.  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  I discussed with social worker about giving him additional resources for primary care doctors so that he can get established he does not keep having repeat ER visits for his seizures.  Discussed  with pharmacy and they recommended give dose of oral depakote given equal to IV.   Patient given 500 of Depakote.  Reevaluated patient and he is currently at baseline.  He is comfortable with discharge home.  He has resources from social work for  follow-up for primary care doctor.   ____________________________________________   FINAL CLINICAL IMPRESSION(S) / ED DIAGNOSES   Final diagnoses:  Seizure (HCC)      MEDICATIONS GIVEN DURING THIS VISIT:  Medications  divalproex (DEPAKOTE ER) 24 hr tablet 500 mg (500 mg Oral Given 07/26/19 1824)     ED Discharge Orders         Ordered    divalproex (DEPAKOTE ER) 500 MG 24 hr tablet  Daily     07/26/19 1840           Note:  This document was prepared using Dragon voice recognition software and may include unintentional dictation errors.   Concha Se, MD 07/26/19 [phone]

## 2019-09-02 ENCOUNTER — Other Ambulatory Visit: Payer: Self-pay

## 2019-09-02 ENCOUNTER — Emergency Department: Payer: Self-pay

## 2019-09-02 ENCOUNTER — Encounter: Payer: Self-pay | Admitting: Emergency Medicine

## 2019-09-02 ENCOUNTER — Emergency Department
Admission: EM | Admit: 2019-09-02 | Discharge: 2019-09-02 | Disposition: A | Discharge status: Home / Self Care | Payer: Self-pay | Attending: Student in an Organized Health Care Education/Training Program | Admitting: Student in an Organized Health Care Education/Training Program

## 2019-09-02 DIAGNOSIS — Z79899 Other long term (current) drug therapy: Secondary | ICD-10-CM | POA: Insufficient documentation

## 2019-09-02 DIAGNOSIS — R569 Unspecified convulsions: Secondary | ICD-10-CM | POA: Insufficient documentation

## 2019-09-02 LAB — BASIC METABOLIC PANEL
Anion gap: 8 (ref 5–15)
BUN: 18 mg/dL (ref 6–20)
CO2: 22 mmol/L (ref 22–32)
Calcium: 8.2 mg/dL — ABNORMAL LOW (ref 8.9–10.3)
Chloride: 106 mmol/L (ref 98–111)
Creatinine, Ser: 0.93 mg/dL (ref 0.61–1.24)
GFR calc Af Amer: >60 mL/min (ref >60)
GFR calc non Af Amer: >60 mL/min (ref >60)
Glucose, Bld: 102 mg/dL — ABNORMAL HIGH (ref 70–99)
Potassium: 3.5 mmol/L (ref 3.5–5.1)
Sodium: 136 mmol/L (ref 135–145)

## 2019-09-02 MED ORDER — DIVALPROEX SODIUM ER 500 MG PO TB24: 500.0000 mg | ORAL_TABLET | Freq: Every day | ORAL | 0 refills | Status: DC

## 2019-09-02 MED ORDER — LEVETIRACETAM IN NACL 1000 MG/100ML IV SOLN
1000.0000 mg | Freq: Once | INTRAVENOUS | Status: AC
  Administered 2019-09-02: 1000 mg via INTRAVENOUS
  Filled 2019-09-02: qty 100

## 2019-09-02 MED ORDER — DIVALPROEX SODIUM ER 250 MG PO TB24
1000.0000 mg | ORAL_TABLET | Freq: Every day | ORAL | Status: DC
  Administered 2019-09-02: 1000 mg via ORAL
  Filled 2019-09-02: qty 4

## 2019-09-02 NOTE — ED Provider Notes (Signed)
[facility] Emergency Department Provider Note    First MD Initiated Contact with Patient 09/02/19 1741     (approximate)  I have reviewed the triage vital signs and the nursing notes.   HISTORY  Chief Complaint Seizures    HPI Lance Ray is a 48 y.o. male the history of seizures as well as noncompliance with frequent visits to the ER for seizure-like activity presents to the ER after witnessed seizure episode that occurred while he was walking to the convenience store.  Did fall hitting his head.  Did not reportedly have a prolonged downtime.  Was found by EMS postictal but did start coming around.  Patient admits to not taking his Depakote and several months despite being offered set up with the medication assistance program.  States he cannot afford medication because he is unemployed.  Denies any pain at this time.    Past Medical History:  Diagnosis Date  . Seizures ([place])    Family History  Problem Relation Age of Onset  . Seizures Mother    Past Surgical History:  Procedure Laterality Date  . SKIN GRAFT     There are no active problems to display for this patient.     Prior to Admission medications   Medication Sig Start Date End Date Taking? Authorizing Provider  divalproex (DEPAKOTE ER) 500 MG 24 hr tablet Take 1 tablet (500 mg total) by mouth daily. 09/02/19 10/02/19  Merlyn Lot, MD  lamoTRIgine (LAMICTAL) 200 MG tablet Take 1 tablet (200 mg total) by mouth 2 (two) times daily. 01/31/19 05/01/19  Menshew, Dannielle Karvonen, PA-C    Allergies Patient has no known allergies.    Social History Social History   Tobacco Use  . Smoking status: Never Smoker  . Smokeless tobacco: Current User  Substance Use Topics  . Alcohol use: Yes    Alcohol/week: 2.0 standard drinks    Types: 2 Shots of liquor per week  . Drug use: No    Review of Systems Patient denies headaches, rhinorrhea, blurry vision, numbness, shortness of breath,  chest pain, edema, cough, abdominal pain, nausea, vomiting, diarrhea, dysuria, fevers, rashes or hallucinations unless otherwise stated above in HPI. ____________________________________________   PHYSICAL EXAM:  VITAL SIGNS: Vitals:   09/02/19 1744 09/02/19 1745  BP:  (!) 144/89  Pulse:  98  Resp:  16  Temp:  98.6 F (37 C)  SpO2: 100% 100%    Constitutional: Alert and oriented.  Eyes: Conjunctivae are normal.  Head: Atraumatic. Nose: No congestion/rhinnorhea. Mouth/Throat: Mucous membranes are moist.   Neck: No stridor. Painless ROM.  Cardiovascular: Normal rate, regular rhythm. Grossly normal heart sounds.  Good peripheral circulation. Respiratory: Normal respiratory effort.  No retractions. Lungs CTAB. Gastrointestinal: Soft and nontender. No distention. No abdominal bruits. No CVA tenderness. Genitourinary:  Musculoskeletal: No lower extremity tenderness nor edema.  No joint effusions. Neurologic:  Normal speech and language. No gross focal neurologic deficits are appreciated. No facial droop Skin:  Skin is warm, dry and intact. No rash noted. Psychiatric: Mood and affect are normal. Speech and behavior are normal.  ____________________________________________   LABS (all labs ordered are listed, but only abnormal results are displayed)  Results for orders placed or performed during the hospital encounter of 09/02/19 (from the past 24 hour(s))  Basic metabolic panel     Status: Abnormal   Collection Time: 09/02/19  5:52 PM  Result Value Ref Range   Sodium 136 135 - 145 mmol/L  Potassium 3.5 3.5 - 5.1 mmol/L   Chloride 106 98 - 111 mmol/L   CO2 22 22 - 32 mmol/L   Glucose, Bld 102 (H) 70 - 99 mg/dL   BUN 18 6 - 20 mg/dL   Creatinine, Ser 5.46 0.61 - 1.24 mg/dL   Calcium 8.2 (L) 8.9 - 10.3 mg/dL   GFR calc non Af Amer >60 >60 mL/min   GFR calc Af Amer >60 >60 mL/min   Anion gap 8 5 - 15   ____________________________________________  EKG My review and  personal interpretation at Time: 18:35   Indication: seizure  Rate: 80  Rhythm: sinus Axis: normal Other: normal intervals, no stemi ____________________________________________  RADIOLOGY  I personally reviewed all radiographic images ordered to evaluate for the above acute complaints and reviewed radiology reports and findings.  These findings were personally discussed with the patient.  Please see medical record for radiology report.  ____________________________________________   PROCEDURES  Procedure(s) performed:  Procedures    Critical Care performed: no ____________________________________________   INITIAL IMPRESSION / ASSESSMENT AND PLAN / ED COURSE  Pertinent labs & imaging results that were available during my care of the patient were reviewed by me and considered in my medical decision making (see chart for details).   DDX: seizure, noncompliance, sdh, sah, concussion  Lance Ray is a 48 y.o. who presents to the ED with known seizure disorder and noncompliance presents to the ER after seizure activity.  Double hit his head.  CT imaging ordered to exclude evidence of acute hemorrhage fracture subdural shows none.  C-spine cleared by Nexus criteria.  Remainder of exam is reassuring.  EKG shows no evidence of dysrhythmia or preexcitation syndrome.  Blood work is reassuring.  Patient given a dose of Keppra here in the ER and given oral pill for his Depakote that he says to be on.  I have also sent a prescription of Depakote to medication assistance program which she was given information about previously.  Patient will be observed in the ER for period of time to ensure he is not having persistent seizure-like activity and if stable will discharge home.  We discussed seizure first aid and to avoid operating motor vehicles or heavy machinery.     The patient was evaluated in Emergency Department today for the symptoms described in the history of present illness. He/she  was evaluated in the context of the global COVID-19 pandemic, which necessitated consideration that the patient might be at risk for infection with the SARS-CoV-2 virus that causes COVID-19. Institutional protocols and algorithms that pertain to the evaluation of patients at risk for COVID-19 are in a state of rapid change based on information released by regulatory bodies including the CDC and federal and state organizations. These policies and algorithms were followed during the patient's care in the ED.  As part of my medical decision making, I reviewed the following data within the electronic MEDICAL RECORD NUMBER Nursing notes reviewed and incorporated, Labs reviewed, notes from prior ED visits and [place] Controlled Substance Database   ____________________________________________   FINAL CLINICAL IMPRESSION(S) / ED DIAGNOSES  Final diagnoses:  Seizure (HCC)      NEW MEDICATIONS STARTED DURING THIS VISIT:  Current Discharge Medication List       Note:  This document was prepared using Dragon voice recognition software and may include unintentional dictation errors.    Willy Eddy, MD 09/02/19 [phone]

## 2019-09-02 NOTE — ED Notes (Signed)
Signature pad is not available.  Patient verbalizes understanding of discharge instructions.

## 2019-09-02 NOTE — ED Notes (Signed)
Pt transported to CT ?

## 2019-09-02 NOTE — ED Triage Notes (Signed)
Pt arrived via [facility], reports pt was walking in public when bystanders witnessed him collapse and start to seize. Pt reports hx of seizure but denies taking medication for them. States that this is his first seizure in 3 months. Denies hitting head or headache.

## 2019-10-29 ENCOUNTER — Encounter: Payer: Self-pay | Admitting: Emergency Medicine

## 2019-10-29 ENCOUNTER — Emergency Department
Admission: EM | Admit: 2019-10-29 | Discharge: 2019-10-30 | Disposition: A | Discharge status: Home / Self Care | Payer: Self-pay | Attending: Student in an Organized Health Care Education/Training Program | Admitting: Student in an Organized Health Care Education/Training Program

## 2019-10-29 ENCOUNTER — Other Ambulatory Visit: Payer: Self-pay

## 2019-10-29 DIAGNOSIS — R569 Unspecified convulsions: Secondary | ICD-10-CM | POA: Insufficient documentation

## 2019-10-29 DIAGNOSIS — F17228 Nicotine dependence, chewing tobacco, with other nicotine-induced disorders: Secondary | ICD-10-CM | POA: Insufficient documentation

## 2019-10-29 LAB — CBC WITH DIFFERENTIAL/PLATELET
Abs Immature Granulocytes: 0.02 10*3/uL (ref 0.00–0.07)
Basophils Absolute: 0.1 10*3/uL (ref 0.0–0.1)
Basophils Relative: 1 %
Eosinophils Absolute: 0.1 10*3/uL (ref 0.0–0.5)
Eosinophils Relative: 2 %
HCT: 43.4 % (ref 39.0–52.0)
Hemoglobin: 14.1 g/dL (ref 13.0–17.0)
Immature Granulocytes: 0 %
Lymphocytes Relative: 22 %
Lymphs Abs: 1.2 10*3/uL (ref 0.7–4.0)
MCH: 29.1 pg (ref 26.0–34.0)
MCHC: 32.5 g/dL (ref 30.0–36.0)
MCV: 89.7 fL (ref 80.0–100.0)
Monocytes Absolute: 0.4 10*3/uL (ref 0.1–1.0)
Monocytes Relative: 7 %
Neutro Abs: 3.8 10*3/uL (ref 1.7–7.7)
Neutrophils Relative %: 68 %
Platelets: 342 10*3/uL (ref 150–400)
RBC: 4.84 MIL/uL (ref 4.22–5.81)
RDW: 12.5 % (ref 11.5–15.5)
WBC: 5.6 10*3/uL (ref 4.0–10.5)
nRBC: 0 % (ref 0.0–0.2)

## 2019-10-29 LAB — BASIC METABOLIC PANEL
Anion gap: 10 (ref 5–15)
BUN: 18 mg/dL (ref 6–20)
CO2: 26 mmol/L (ref 22–32)
Calcium: 9.2 mg/dL (ref 8.9–10.3)
Chloride: 105 mmol/L (ref 98–111)
Creatinine, Ser: 1.01 mg/dL (ref 0.61–1.24)
GFR calc Af Amer: >60 mL/min (ref >60)
GFR calc non Af Amer: >60 mL/min (ref >60)
Glucose, Bld: 111 mg/dL — ABNORMAL HIGH (ref 70–99)
Potassium: 4 mmol/L (ref 3.5–5.1)
Sodium: 141 mmol/L (ref 135–145)

## 2019-10-29 MED ORDER — DIVALPROEX SODIUM 500 MG PO DR TAB
500.0000 mg | DELAYED_RELEASE_TABLET | Freq: Once | ORAL | Status: AC
  Administered 2019-10-29: 500 mg via ORAL
  Filled 2019-10-29: qty 1

## 2019-10-29 MED ORDER — LEVETIRACETAM IN NACL 1000 MG/100ML IV SOLN
1000.0000 mg | Freq: Two times a day (BID) | INTRAVENOUS | Status: DC
  Administered 2019-10-29: 1000 mg via INTRAVENOUS
  Filled 2019-10-29: qty 100

## 2019-10-29 MED ORDER — LORAZEPAM 2 MG/ML IJ SOLN
1.0000 mg | Freq: Once | INTRAMUSCULAR | Status: AC
  Administered 2019-10-29: 1 mg via INTRAVENOUS

## 2019-10-29 MED ORDER — LORAZEPAM 2 MG/ML IJ SOLN
INTRAMUSCULAR | Status: AC
  Filled 2019-10-29: qty 1

## 2019-10-29 MED ORDER — DIVALPROEX SODIUM ER 250 MG PO TB24
500.0000 mg | ORAL_TABLET | Freq: Every day | ORAL | Status: DC
  Administered 2019-10-29: 500 mg via ORAL
  Filled 2019-10-29: qty 2

## 2019-10-29 NOTE — ED Provider Notes (Signed)
[facility] Emergency Department Provider Note    First MD Initiated Contact with Patient 10/29/19 1644     (approximate)  I have reviewed the triage vital signs and the nursing notes.   HISTORY  Chief Complaint Seizures    HPI Lance Ray is a 49 y.o. male known seizure disorder while in his facility presents the ER after another witnessed seizure-like episode.  States he is feeling fine at this point.  Reportedly was postictal and generalized tonic-clonic shaking episode.  States he has not taken his medications.  States he was not able to "afford them ".  I took care of patient last time he was in the ER and sent his prescription to the medication assistance program.  Patient cannot provide reason why he was not able to pick these up.  Denies any pain or discomfort.     Past Medical History:  Diagnosis Date  . Seizures (HCC)    Family History  Problem Relation Age of Onset  . Seizures Mother    Past Surgical History:  Procedure Laterality Date  . SKIN GRAFT     There are no problems to display for this patient.     Prior to Admission medications   Medication Sig Start Date End Date Taking? Authorizing Provider  divalproex (DEPAKOTE ER) 500 MG 24 hr tablet Take 1 tablet (500 mg total) by mouth daily. 09/02/19 10/02/19  Willy Eddy, MD  lamoTRIgine (LAMICTAL) 200 MG tablet Take 1 tablet (200 mg total) by mouth 2 (two) times daily. 01/31/19 05/01/19  Menshew, Charlesetta Ivory, PA-C    Allergies Patient has no known allergies.    Social History Social History   Tobacco Use  . Smoking status: Never Smoker  . Smokeless tobacco: Current User  Substance Use Topics  . Alcohol use: Yes    Alcohol/week: 2.0 standard drinks    Types: 2 Shots of liquor per week  . Drug use: No    Review of Systems Patient denies headaches, rhinorrhea, blurry vision, numbness, shortness of breath, chest pain, edema, cough, abdominal pain, nausea,  vomiting, diarrhea, dysuria, fevers, rashes or hallucinations unless otherwise stated above in HPI. ____________________________________________   PHYSICAL EXAM:  VITAL SIGNS: Vitals:   10/29/19 2100 10/29/19 2200  BP: 125/80 113/74  Pulse: 68 67  Resp: 11 12  Temp:    SpO2: 100% 100%    Constitutional: Alert and oriented.  Eyes: Conjunctivae are normal.  Head: Atraumatic. Nose: No congestion/rhinnorhea. Mouth/Throat: Mucous membranes are moist.   Neck: No stridor. Painless ROM.  Cardiovascular: Normal rate, regular rhythm. Grossly normal heart sounds.  Good peripheral circulation. Respiratory: Normal respiratory effort.  No retractions. Lungs CTAB. Gastrointestinal: Soft and nontender. No distention. No abdominal bruits. No CVA tenderness. Genitourinary:  Musculoskeletal: No lower extremity tenderness nor edema.  No joint effusions. Neurologic:  Normal speech and language. No gross focal neurologic deficits are appreciated. No facial droop Skin:  Skin is warm, dry and intact. No rash noted. Psychiatric: Mood and affect are normal. Speech and behavior are normal.  ____________________________________________   LABS (all labs ordered are listed, but only abnormal results are displayed)  Results for orders placed or performed during the hospital encounter of 10/29/19 (from the past 24 hour(s))  Basic metabolic panel     Status: Abnormal   Collection Time: 10/29/19  5:00 PM  Result Value Ref Range   Sodium 141 135 - 145 mmol/L   Potassium 4.0 3.5 - 5.1 mmol/L  Chloride 105 98 - 111 mmol/L   CO2 26 22 - 32 mmol/L   Glucose, Bld 111 (H) 70 - 99 mg/dL   BUN 18 6 - 20 mg/dL   Creatinine, Ser 1.01 0.61 - 1.24 mg/dL   Calcium 9.2 8.9 - 10.3 mg/dL   GFR calc non Af Amer >60 >60 mL/min   GFR calc Af Amer >60 >60 mL/min   Anion gap 10 5 - 15  CBC with Differential/Platelet     Status: None   Collection Time: 10/29/19  5:00 PM  Result Value Ref Range   WBC 5.6 4.0 - 10.5  K/uL   RBC 4.84 4.22 - 5.81 MIL/uL   Hemoglobin 14.1 13.0 - 17.0 g/dL   HCT 43.4 39.0 - 52.0 %   MCV 89.7 80.0 - 100.0 fL   MCH 29.1 26.0 - 34.0 pg   MCHC 32.5 30.0 - 36.0 g/dL   RDW 12.5 11.5 - 15.5 %   Platelets 342 150 - 400 K/uL   nRBC 0.0 0.0 - 0.2 %   Neutrophils Relative % 68 %   Neutro Abs 3.8 1.7 - 7.7 K/uL   Lymphocytes Relative 22 %   Lymphs Abs 1.2 0.7 - 4.0 K/uL   Monocytes Relative 7 %   Monocytes Absolute 0.4 0.1 - 1.0 K/uL   Eosinophils Relative 2 %   Eosinophils Absolute 0.1 0.0 - 0.5 K/uL   Basophils Relative 1 %   Basophils Absolute 0.1 0.0 - 0.1 K/uL   Immature Granulocytes 0 %   Abs Immature Granulocytes 0.02 0.00 - 0.07 K/uL   ____________________________________________  EKG My review and personal interpretation at Time: 17:08   Indication: sz like activity  Rate: 100  Rhythm: sinus Axis: normal Other: normal intervals, no stemi ____________________________________________  RADIOLOGY   ____________________________________________   PROCEDURES  Procedure(s) performed:  Procedures    Critical Care performed: no ____________________________________________   INITIAL IMPRESSION / ASSESSMENT AND PLAN / ED COURSE  Pertinent labs & imaging results that were available during my care of the patient were reviewed by me and considered in my medical decision making (see chart for details).   DDX: seizure, status, dysrhythmia, electrolyte abnm, medication noncompliance  Lance Ray is a 48 y.o. who presents to the ED with history of seizures presents with witnessed seizure.  Currently well-appearing neuro exam is reassuring.  No reported head trauma.  No fevers.  Does endorse noncompliance.  I sent his medication to her medication assistance program patient states he did not make efforts to pick up medication.  Will redose here and observe in the ER.  Clinical Course as of Oct 28 2310  Wed Oct 29, 2019  1759 Patient had another brief seizure.   Had not received his medication being sent from pharmacy.  Ativan given.   [PR]  2208 Patient arousable to voice.  Protecting his airway.  Still drowsy.  We will continue to observe.   [PR]    Clinical Course User Index [PR] Merlyn Lot, MD    The patient was evaluated in Emergency Department today for the symptoms described in the history of present illness. He/she was evaluated in the context of the global COVID-19 pandemic, which necessitated consideration that the patient might be at risk for infection with the SARS-CoV-2 virus that causes COVID-19. Institutional protocols and algorithms that pertain to the evaluation of patients at risk for COVID-19 are in a state of rapid change based on information released by regulatory bodies including the CDC  and federal and state organizations. These policies and algorithms were followed during the patient's care in the ED.  As part of my medical decision making, I reviewed the following data within the electronic MEDICAL RECORD NUMBER Nursing notes reviewed and incorporated, Labs reviewed, notes from prior ED visits and [place] Controlled Substance Database   ____________________________________________   FINAL CLINICAL IMPRESSION(S) / ED DIAGNOSES  Final diagnoses:  Seizure (HCC)      NEW MEDICATIONS STARTED DURING THIS VISIT:  New Prescriptions   No medications on file     Note:  This document was prepared using Dragon voice recognition software and may include unintentional dictation errors.    Willy Eddy, MD 10/29/19 2312

## 2019-10-29 NOTE — ED Triage Notes (Signed)
Pt presents to ED via [facility] with c/o seizures. Per EMS initially paged out for cardiac arrest, reports when medic truck arrived on scene patient had agonal respirations. Per EMS pt was confused en route, thinks year was 2015 and confused regarding the president. Per EMS pt with hx of seizures.  18g L forearm  145/100 98% 110 ST

## 2019-10-29 NOTE — ED Notes (Signed)
Pt had grand-mal seizure lasting approx 45 seconds. Dr. Lenard Lance to bedside during seizure. VORB for 1mg  Ativan IV, administered immediately after seizure like activity finished. Initially during seizure pt placed on 15L via [facility]. Pt now placed on 2L via [place].    Per Dr. , administer Depakote DR and ER when patient is more alert.

## 2019-10-30 NOTE — ED Notes (Signed)
Pt found to be sitting at the edge of the bed pulling off monitoring cords.  This RN entered room asking patient if he's okay.  Patient states he wants to go home, home would be more comfortable.  RN asked patient to sit back fully in the bed and that the MD would be notified.  Patient agreeable at this time.

## 2019-10-30 NOTE — ED Notes (Signed)
Pt able to get in touch with sister, Aram Beecham, who is coming to get patient.

## 2019-11-03 ENCOUNTER — Emergency Department
Admission: EM | Admit: 2019-11-03 | Discharge: 2019-11-03 | Disposition: A | Discharge status: Home / Self Care | Payer: Self-pay | Attending: Emergency Medicine | Admitting: Emergency Medicine

## 2019-11-03 ENCOUNTER — Emergency Department: Payer: Self-pay

## 2019-11-03 ENCOUNTER — Encounter: Payer: Self-pay | Admitting: Emergency Medicine

## 2019-11-03 ENCOUNTER — Other Ambulatory Visit: Payer: Self-pay

## 2019-11-03 DIAGNOSIS — Z9114 Patient's other noncompliance with medication regimen: Secondary | ICD-10-CM | POA: Insufficient documentation

## 2019-11-03 DIAGNOSIS — G40909 Epilepsy, unspecified, not intractable, without status epilepticus: Secondary | ICD-10-CM | POA: Insufficient documentation

## 2019-11-03 LAB — CBC WITH DIFFERENTIAL/PLATELET
Abs Immature Granulocytes: 0.03 10*3/uL (ref 0.00–0.07)
Basophils Absolute: 0 10*3/uL (ref 0.0–0.1)
Basophils Relative: 1 %
Eosinophils Absolute: 0.2 10*3/uL (ref 0.0–0.5)
Eosinophils Relative: 3 %
HCT: 40.7 % (ref 39.0–52.0)
Hemoglobin: 13.5 g/dL (ref 13.0–17.0)
Immature Granulocytes: 1 %
Lymphocytes Relative: 25 %
Lymphs Abs: 1.3 10*3/uL (ref 0.7–4.0)
MCH: 29.9 pg (ref 26.0–34.0)
MCHC: 33.2 g/dL (ref 30.0–36.0)
MCV: 90.2 fL (ref 80.0–100.0)
Monocytes Absolute: 0.4 10*3/uL (ref 0.1–1.0)
Monocytes Relative: 7 %
Neutro Abs: 3.3 10*3/uL (ref 1.7–7.7)
Neutrophils Relative %: 63 %
Platelets: 291 10*3/uL (ref 150–400)
RBC: 4.51 MIL/uL (ref 4.22–5.81)
RDW: 12.8 % (ref 11.5–15.5)
WBC: 5.2 10*3/uL (ref 4.0–10.5)
nRBC: 0 % (ref 0.0–0.2)

## 2019-11-03 LAB — BASIC METABOLIC PANEL
Anion gap: 10 (ref 5–15)
BUN: 14 mg/dL (ref 6–20)
CO2: 23 mmol/L (ref 22–32)
Calcium: 9 mg/dL (ref 8.9–10.3)
Chloride: 107 mmol/L (ref 98–111)
Creatinine, Ser: 0.87 mg/dL (ref 0.61–1.24)
GFR calc Af Amer: >60 mL/min (ref >60)
GFR calc non Af Amer: >60 mL/min (ref >60)
Glucose, Bld: 93 mg/dL (ref 70–99)
Potassium: 3.8 mmol/L (ref 3.5–5.1)
Sodium: 140 mmol/L (ref 135–145)

## 2019-11-03 LAB — VALPROIC ACID LEVEL: Valproic Acid Lvl: <10 ug/mL — ABNORMAL LOW (ref 50.0–100.0)

## 2019-11-03 LAB — ETHANOL: Alcohol, Ethyl (B): <10 mg/dL (ref <10)

## 2019-11-03 MED ORDER — LACTATED RINGERS IV BOLUS
1000.0000 mL | Freq: Once | INTRAVENOUS | Status: AC
  Administered 2019-11-03: 1000 mL via INTRAVENOUS

## 2019-11-03 MED ORDER — DIVALPROEX SODIUM ER 250 MG PO TB24
500.0000 mg | ORAL_TABLET | Freq: Once | ORAL | Status: AC
  Administered 2019-11-03: 500 mg via ORAL
  Filled 2019-11-03: qty 2

## 2019-11-03 MED ORDER — LORAZEPAM 2 MG/ML IJ SOLN
1.0000 mg | Freq: Once | INTRAMUSCULAR | Status: AC
  Administered 2019-11-03: 17:00:00 1 mg via INTRAVENOUS
  Filled 2019-11-03: qty 1

## 2019-11-03 NOTE — ED Notes (Signed)
Discharge instructions signed and placed in patients chart.

## 2019-11-03 NOTE — ED Notes (Signed)
Attempting to contact patients family for transport

## 2019-11-03 NOTE — ED Triage Notes (Signed)
Pt arrival via [facility] from grocery store due to seizures. Pt has history of epilepsy but denies taking medication for it at this time. Pt was found by the fire department in a grocery store having a seizure. Pt was post-ictal with EMS.   Pt was not witnessed falling. Pt denies complaints at this time.  EMS vitals HR- 105 BP- 145/80

## 2019-11-03 NOTE — ED Notes (Signed)
Contacted with sister angela, states she is working until 10pm. States to call other sister and provided correct phone number. This nurse called Aram Beecham and it went straight to voicemail, voicemail left with cynthia. Will try again soon

## 2019-11-03 NOTE — ED Provider Notes (Signed)
[facility] Emergency Department Provider Note   ____________________________________________   First MD Initiated Contact with Patient 11/03/19 1611     (approximate)  I have reviewed the triage vital signs and the nursing notes.   HISTORY  Chief Complaint Seizures    HPI Lance Ray is a 49 y.o. male with past medical history of seizures who presents to the ED for seizure episode.  Per EMS, patient had a witnessed seizure at the grocery store and was found by the fire department to have generalized tonic-clonic seizure activity.  Seizure activity stopped by the time EMS arrived and he did not require the administration of any medications.  He was postictal during transport and gradually returned to his baseline mental status per EMS.  He currently denies any complaints upon arrival to the ED, is not sure whether he hit his head or not when he fell, but denies any pain.  He does admit to not taking his seizure medications because he has been unable to afford them.  He states he is supposed to be taking Depakote and Lamictal.  He denies alcohol or drug abuse.        Past Medical History:  Diagnosis Date  . Seizures ([place])     There are no problems to display for this patient.   Past Surgical History:  Procedure Laterality Date  . SKIN GRAFT      Prior to Admission medications   Medication Sig Start Date End Date Taking? Authorizing Provider  divalproex (DEPAKOTE ER) 500 MG 24 hr tablet Take 1 tablet (500 mg total) by mouth daily. 09/02/19 10/02/19  Merlyn Lot, MD  lamoTRIgine (LAMICTAL) 200 MG tablet Take 1 tablet (200 mg total) by mouth 2 (two) times daily. 01/31/19 05/01/19  Menshew, Dannielle Karvonen, PA-C    Allergies Patient has no known allergies.  Family History  Problem Relation Age of Onset  . Seizures Mother     Social History Social History   Tobacco Use  . Smoking status: Never Smoker  . Smokeless tobacco: Current User   Substance Use Topics  . Alcohol use: Yes    Alcohol/week: 2.0 standard drinks    Types: 2 Shots of liquor per week  . Drug use: No    Review of Systems  Constitutional: No fever/chills Eyes: No visual changes. ENT: No sore throat. Cardiovascular: Denies chest pain. Respiratory: Denies shortness of breath. Gastrointestinal: No abdominal pain.  No nausea, no vomiting.  No diarrhea.  No constipation. Genitourinary: Negative for dysuria. Musculoskeletal: Negative for back pain. Skin: Negative for rash. Neurological: Negative for headaches, focal weakness or numbness.  Positive for seizure.  ____________________________________________   PHYSICAL EXAM:  VITAL SIGNS: ED Triage Vitals  Enc Vitals Group     BP      Pulse      Resp      Temp      Temp src      SpO2      Weight      Height      Head Circumference      Peak Flow      Pain Score      Pain Loc      Pain Edu?      Excl. in [place]?     Constitutional: Alert and oriented. Eyes: Conjunctivae are normal. Head: Atraumatic. Nose: No congestion/rhinnorhea. Mouth/Throat: Mucous membranes are moist. Neck: Normal ROM Cardiovascular: Normal rate, regular rhythm. Grossly normal heart sounds. Respiratory: Normal respiratory effort.  No retractions. Lungs CTAB. Gastrointestinal: Soft and nontender. No distention. Genitourinary: deferred Musculoskeletal: No lower extremity tenderness nor edema. Neurologic:  Normal speech and language. No gross focal neurologic deficits are appreciated. Skin:  Skin is warm, dry and intact. No rash noted. Psychiatric: Mood and affect are normal. Speech and behavior are normal.  ____________________________________________   LABS (all labs ordered are listed, but only abnormal results are displayed)  Labs Reviewed  VALPROIC ACID LEVEL - Abnormal; Notable for the following components:      Result Value   Valproic Acid Lvl <10 (*)    All other components within normal limits  BASIC  METABOLIC PANEL  CBC WITH DIFFERENTIAL/PLATELET  ETHANOL     PROCEDURES  Procedure(s) performed (including Critical Care):  Procedures   ____________________________________________   INITIAL IMPRESSION / ASSESSMENT AND PLAN / ED COURSE       49 year old male with history of seizures presents to the ED following witnessed seizure at the grocery store earlier today.  It is unclear whether he hit his head although states the last thing that he remembers was walking around the store, will screen CT head but we are able to clear his C-spine clinically.  He has a long history of noncompliance with his seizure medications, will give dose of Ativan for now and restart his usual seizure medications.  Lab work unremarkable, Depakote levels undetectable.  We will give his usual dose of Depakote and it is unclear if he should be taking an additional seizure medication with this.  Patient again stating that he cannot afford his seizure medications, although he has been referred to the medication assistance program on multiple occasions.  I have referred him to the [facility], which has a pharmacy available to help with medications.  He states he already has a prescription for the Depakote, just needs to get it filled.  He has remained seizure-free here in the ED, remains at his baseline mental status and is appropriate for discharge home with outpatient follow-up.      ____________________________________________   FINAL CLINICAL IMPRESSION(S) / ED DIAGNOSES  Final diagnoses:  Seizure disorder [facility])  H/O medication noncompliance     ED Discharge Orders    None       Note:  This document was prepared using Dragon voice recognition software and may include unintentional dictation errors.   Chesley Noon, MD 11/03/19 [phone]

## 2019-11-14 ENCOUNTER — Other Ambulatory Visit: Payer: Self-pay

## 2019-11-14 ENCOUNTER — Emergency Department
Admission: EM | Admit: 2019-11-14 | Discharge: 2019-11-14 | Disposition: A | Discharge status: Home / Self Care | Payer: Self-pay | Attending: Emergency Medicine | Admitting: Emergency Medicine

## 2019-11-14 ENCOUNTER — Encounter: Payer: Self-pay | Admitting: Emergency Medicine

## 2019-11-14 DIAGNOSIS — R569 Unspecified convulsions: Secondary | ICD-10-CM | POA: Insufficient documentation

## 2019-11-14 DIAGNOSIS — Z9114 Patient's other noncompliance with medication regimen: Secondary | ICD-10-CM | POA: Insufficient documentation

## 2019-11-14 LAB — CBC WITH DIFFERENTIAL/PLATELET
Abs Immature Granulocytes: 0.03 10*3/uL (ref 0.00–0.07)
Basophils Absolute: 0.1 10*3/uL (ref 0.0–0.1)
Basophils Relative: 1 %
Eosinophils Absolute: 0.4 10*3/uL (ref 0.0–0.5)
Eosinophils Relative: 5 %
HCT: 40.9 % (ref 39.0–52.0)
Hemoglobin: 13.3 g/dL (ref 13.0–17.0)
Immature Granulocytes: 0 %
Lymphocytes Relative: 24 %
Lymphs Abs: 1.6 10*3/uL (ref 0.7–4.0)
MCH: 29.2 pg (ref 26.0–34.0)
MCHC: 32.5 g/dL (ref 30.0–36.0)
MCV: 89.9 fL (ref 80.0–100.0)
Monocytes Absolute: 0.4 10*3/uL (ref 0.1–1.0)
Monocytes Relative: 7 %
Neutro Abs: 4.3 10*3/uL (ref 1.7–7.7)
Neutrophils Relative %: 63 %
Platelets: 311 10*3/uL (ref 150–400)
RBC: 4.55 MIL/uL (ref 4.22–5.81)
RDW: 13.2 % (ref 11.5–15.5)
WBC: 6.8 10*3/uL (ref 4.0–10.5)
nRBC: 0 % (ref 0.0–0.2)

## 2019-11-14 LAB — COMPREHENSIVE METABOLIC PANEL
ALT: 14 U/L (ref 0–44)
AST: 17 U/L (ref 15–41)
Albumin: 3.9 g/dL (ref 3.5–5.0)
Alkaline Phosphatase: 89 U/L (ref 38–126)
Anion gap: 8 (ref 5–15)
BUN: 16 mg/dL (ref 6–20)
CO2: 26 mmol/L (ref 22–32)
Calcium: 8.8 mg/dL — ABNORMAL LOW (ref 8.9–10.3)
Chloride: 106 mmol/L (ref 98–111)
Creatinine, Ser: 0.99 mg/dL (ref 0.61–1.24)
GFR calc Af Amer: >60 mL/min (ref >60)
GFR calc non Af Amer: >60 mL/min (ref >60)
Glucose, Bld: 100 mg/dL — ABNORMAL HIGH (ref 70–99)
Potassium: 4.6 mmol/L (ref 3.5–5.1)
Sodium: 140 mmol/L (ref 135–145)
Total Bilirubin: 0.4 mg/dL (ref 0.3–1.2)
Total Protein: 6.3 g/dL — ABNORMAL LOW (ref 6.5–8.1)

## 2019-11-14 MED ORDER — LORAZEPAM 2 MG/ML IJ SOLN
1.0000 mg | Freq: Once | INTRAMUSCULAR | Status: AC
  Administered 2019-11-14: 13:00:00 1 mg via INTRAVENOUS
  Filled 2019-11-14: qty 1

## 2019-11-14 MED ORDER — DIVALPROEX SODIUM ER 500 MG PO TB24: 500.0000 mg | ORAL_TABLET | Freq: Every day | ORAL | 1 refills | Status: DC

## 2019-11-14 MED ORDER — VALPROIC ACID 250 MG/5ML PO SOLN
500.0000 mg | ORAL | Status: AC
  Administered 2019-11-14: 15:00:00 500 mg via ORAL
  Filled 2019-11-14: qty 10

## 2019-11-14 MED ORDER — DIVALPROEX SODIUM 500 MG PO DR TAB
500.0000 mg | DELAYED_RELEASE_TABLET | Freq: Once | ORAL | Status: AC
  Administered 2019-11-14: 15:00:00 500 mg via ORAL
  Filled 2019-11-14: qty 1

## 2019-11-14 MED ORDER — VALPROIC ACID 250 MG PO CAPS
500.0000 mg | ORAL_CAPSULE | Freq: Once | ORAL | Status: DC
  Filled 2019-11-14: qty 2

## 2019-11-14 NOTE — ED Provider Notes (Signed)
[facility] Emergency Department Provider Note  ____________________________________________   First MD Initiated Contact with Patient 11/14/19 1226     (approximate)  I have reviewed the triage vital signs and the nursing notes.   HISTORY  Chief Complaint Seizures    HPI Lance Ray is a 49 y.o. male  With PMHx seizure d/o here with seizure. Pt has an extensive h/o seizure d/o, nonadherence. He reportedly had a witnessed GTC seizure activity today. He admits he has not been taking his medications at all. He denies any preceding fever, chills, states he was in his usual state of health until he had a seizure today. He did not feel an aura which is normal for him. Awoke with EMS. Per EMS report, he was postictal on arrival but now is increasingly alert. Denies any HA. Did not bite his tongue. Remainder of hx limited 2/2 post ictal state at this time.       Past Medical History:  Diagnosis Date  . Seizures (HCC)     There are no problems to display for this patient.   Past Surgical History:  Procedure Laterality Date  . SKIN GRAFT      Prior to Admission medications   Medication Sig Start Date End Date Taking? Authorizing Provider  divalproex (DEPAKOTE ER) 500 MG 24 hr tablet Take 1 tablet (500 mg total) by mouth daily. 11/14/19 01/13/20  Shaune Pollack, MD  lamoTRIgine (LAMICTAL) 200 MG tablet Take 1 tablet (200 mg total) by mouth 2 (two) times daily. 01/31/19 05/01/19  Menshew, Charlesetta Ivory, PA-C    Allergies Patient has no known allergies.  Family History  Problem Relation Age of Onset  . Seizures Mother     Social History Social History   Tobacco Use  . Smoking status: Never Smoker  . Smokeless tobacco: Current User  Substance Use Topics  . Alcohol use: Yes    Alcohol/week: 2.0 standard drinks    Types: 2 Shots of liquor per week  . Drug use: No    Review of Systems  Review of Systems  Unable to perform ROS: Mental status  change  Constitutional: Positive for fatigue.  Neurological: Positive for seizures.     ____________________________________________  PHYSICAL EXAM:      VITAL SIGNS: ED Triage Vitals  Enc Vitals Group     BP 11/14/19 1223 (!) 141/89     Pulse Rate 11/14/19 1223 95     Resp 11/14/19 1223 14     Temp 11/14/19 1223 98.1 F (36.7 C)     Temp Source 11/14/19 1223 Oral     SpO2 11/14/19 1223 96 %     Weight 11/14/19 1225 180 lb 0.8 oz (81.7 kg)     Height 11/14/19 1225 5\' 9"  (1.753 m)     Head Circumference --      Peak Flow --      Pain Score 11/14/19 1224 0     Pain Loc --      Pain Edu? --      Excl. in GC? --      Physical Exam Vitals and nursing note reviewed.  Constitutional:      General: He is not in acute distress.    Appearance: He is well-developed.     Comments: Drowsy but aroused easily.  HENT:     Head: Normocephalic and atraumatic.     Comments: No apparent head or neck trauma. No tongue lacerations.  Eyes:  Conjunctiva/sclera: Conjunctivae normal.  Cardiovascular:     Rate and Rhythm: Normal rate and regular rhythm.     Heart sounds: Normal heart sounds.  Pulmonary:     Effort: Pulmonary effort is normal. No respiratory distress.     Breath sounds: No wheezing.  Abdominal:     General: There is no distension.  Musculoskeletal:     Cervical back: Neck supple.  Skin:    General: Skin is warm.     Capillary Refill: Capillary refill takes less than 2 seconds.     Findings: No rash.  Neurological:     Mental Status: He is alert and oriented to person, place, and time.     Motor: No abnormal muscle tone.     Comments: Neurological Exam:  Mental Status: Alert and oriented to person, place, and time. Attention and concentration normal. Speech clear. Recent memory is intact. Cranial Nerves: Visual fields grossly intact. EOMI and PERRLA. No nystagmus noted. Facial sensation intact at forehead, maxillary cheek, and chin/mandible bilaterally. No facial  asymmetry or weakness. Hearing grossly normal. Uvula is midline, and palate elevates symmetrically. Normal SCM and trapezius strength. Tongue midline without fasciculations. Motor: Muscle strength 5/5 in proximal and distal UE and LE bilaterally. No pronator drift. Muscle tone normal. Sensation: Intact to light touch in upper and lower extremities distally bilaterally.  Gait: Normal without ataxia. Coordination: Normal FTN bilaterally.          ____________________________________________   LABS (all labs ordered are listed, but only abnormal results are displayed)  Labs Reviewed  COMPREHENSIVE METABOLIC PANEL - Abnormal; Notable for the following components:      Result Value   Glucose, Bld 100 (*)    Calcium 8.8 (*)    Total Protein 6.3 (*)    All other components within normal limits  CBC WITH DIFFERENTIAL/PLATELET    ____________________________________________  EKG: None ________________________________________  RADIOLOGY All imaging, including plain films, CT scans, and ultrasounds, independently reviewed by me, and interpretations confirmed via formal radiology reads.  ED MD interpretation:   None  Official radiology report(s): No results found.  ____________________________________________  PROCEDURES   Procedure(s) performed (including Critical Care):  Procedures  ____________________________________________  INITIAL IMPRESSION / MDM / ASSESSMENT AND PLAN / ED COURSE  As part of my medical decision making, I reviewed the following data within the electronic MEDICAL RECORD NUMBER Nursing notes reviewed and incorporated, Old chart reviewed, Notes from prior ED visits, and [place] Controlled Substance Database       *Lance Ray was evaluated in Emergency Department on 11/14/2019 for the symptoms described in the history of present illness. He was evaluated in the context of the global COVID-19 pandemic, which necessitated consideration that the patient  might be at risk for infection with the SARS-CoV-2 virus that causes COVID-19. Institutional protocols and algorithms that pertain to the evaluation of patients at risk for COVID-19 are in a state of rapid change based on information released by regulatory bodies including the CDC and federal and state organizations. These policies and algorithms were followed during the patient's care in the ED.  Some ED evaluations and interventions may be delayed as a result of limited staffing during the pandemic.*     Medical Decision Making:  49 yo M here with recurrent seizure 2/2 med nonadherence. No signs of trauma. Labs overall unremarkable. He was monitored in ED and is back to his baseline. Given PO depakote load and monitored w/o any recurrence of seizures.   I extensively  reviewed the patient's previous visits.  He has been offered multiple referrals, discounted medications, without any effect.  He states that he just has a difficult time taking medication.  Reiterated the importance of adherence.  I also asked social work to see him, who will provide him with a taxi cab to get his medication today, as well as referral for free clinic.  I have resent his medications to the medication management clinic.  ____________________________________________  FINAL CLINICAL IMPRESSION(S) / ED DIAGNOSES  Final diagnoses:  Seizure ([place])     MEDICATIONS GIVEN DURING THIS VISIT:  Medications  LORazepam (ATIVAN) injection 1 mg (1 mg Intravenous Given 11/14/19 1247)  divalproex (DEPAKOTE) DR tablet 500 mg (500 mg Oral Given 11/14/19 1509)  valproic acid (DEPAKENE) 250 MG/5ML solution 500 mg (500 mg Oral Given 11/14/19 1509)     ED Discharge Orders         Ordered    divalproex (DEPAKOTE ER) 500 MG 24 hr tablet  Daily     11/14/19 1547           Note:  This document was prepared using Dragon voice recognition software and may include unintentional dictation errors.   Duffy Bruce, MD 11/14/19  1549

## 2019-11-14 NOTE — ED Notes (Signed)
Pt drowsy, will hold po meds until he is more awake.

## 2019-11-14 NOTE — Care Management (Signed)
TOC RN CM: Attempted to speak with patient regarding medication concerns however patient remains postictal and unable to speak with RN CM. Will attempt to talk with once more alert. Options for medication include [facility] due to transportation being an issue. Will discuss once more alert.

## 2019-11-14 NOTE — Care Management (Signed)
TOC RN CM: Attempted to speak with patient however patient sleeping and unable to talk with CM at this time. MD made aware and will follow up later in the day.

## 2019-11-14 NOTE — ED Triage Notes (Signed)
PT to ER via EMS after witnessed seizure described as whole body.  Pt was post ictal on EMS arrival, now A&O x 4.  Pt states he has not been taking his prescribed seizure meds (depakote) for months.

## 2019-11-14 NOTE — ED Notes (Signed)
Pt moved to 18H pending D/C until his ride arrives. Pt states understanding at this time.

## 2019-11-14 NOTE — TOC Initial Note (Signed)
Transition of Care [facility]) - Initial/Assessment Note    Patient Details  Name: Lance Ray MRN: [id number] Date of Birth: [birth year]  [facility] CM/SW Contact:    [employer] C[employer], RN Phone Number: 11/14/2019, 3:26 PM  Clinical Narrative:                 Patient states he needs help getting his medication. States he has no transportation and no insurance due to not having a job. Patient states he does not have a phone and rents a house from his mother who lives next door but has recently been placed into a group home. Patient states he is living off his savings account currently but it is currently getting low. Patient states he is not able to afford his medications and has no transportation to get to and from a pharmacy. RN CM discussed possibility of mail order through [employer] and possible medication management however patient would need to find transportation to pick up medication. RN CM discussed [facility] as option for PCP and to follow up on disability paperwork however patient appeared uninterested but was willing to accept literature related to [facility] States he might outreach to them and set up appointment.   Expected Discharge Plan: Home/Self Care     Patient Goals and CMS Choice Patient states their goals for this hospitalization and ongoing recovery are:: Get some medication      Expected Discharge Plan and Services Expected Discharge Plan: Home/Self Care       Living arrangements for the past 2 months: Single Family Home                                      Prior Living Arrangements/Services Living arrangements for the past 2 months: Single Family Home Lives with:: Self Patient language and need for interpreter reviewed:: Yes Do you feel safe going back to the place where you live?: Yes      Need for Family Participation in Patient Care: No (Comment) Care giver support system in place?: No (comment)   Criminal Activity/Legal  Involvement Pertinent to Current Situation/Hospitalization: No - Comment as needed  Activities of Daily Living      Permission Sought/Granted                  Emotional Assessment Appearance:: Appears older than stated age Attitude/Demeanor/Rapport: Engaged Affect (typically observed): Agitated Orientation: : Oriented to Self, Oriented to Place, Oriented to  Time, Oriented to Situation Alcohol / Substance Use: Never Used Psych Involvement: No (comment)  Admission diagnosis:  Seizures There are no problems to display for this patient.  PCP:  Patient, No Pcp Per Pharmacy:   [facility] [place], [place] - [address] [place] [address] Phone: [phone] Fax: [phone]  [id number] - [place], [place] - [address] [place] [address] Phone: [phone] Fax: [phone]  [facility] - [place], [place] - [address] [place] [address] Phone: [phone] Fax: [phone]     Social Determinants of Health (SDOH) Interventions    Readmission Risk Interventions No flowsheet data found.

## 2019-11-14 NOTE — ED Notes (Signed)
IV removed and D/C instructions reviewed with patient by Victorino Dike, RN. Pt states that he believes his ride is outside. This RN unable to obtain D/C E-sig at this time, verbal consent for D/C obtained by this RN at this time. Pt ambulatory to the lobby at this time.

## 2019-12-17 ENCOUNTER — Telehealth: Payer: Self-pay | Admitting: Pharmacy Technician

## 2019-12-17 NOTE — Telephone Encounter (Signed)
Patient failed to provide 2021 proof of income.  No additional medication assistance will be provided by [facility] without the required proof of income documentation.  Patient notified by letter.  Lautaro Koral J. Leopold Smyers Care Manager [facility]   [address] [place], [place]  [address]     This is to inform you that you are no longer eligible to receive medication assistance at [facility].  The reason(s) are:    _____Your total gross monthly household income exceeds 250% of the Federal Poverty Level.   _____Tangible assets (savings, checking, stocks/bonds, pension, retirement, etc.) exceeds our limit  _____You are eligible to receive benefits from Medicaid, [facility] or HIV Medication              Assistance Program _____You are eligible to receive benefits from a Medicare Part "D" plan _____You have prescription insurance  _____You are not an [place] resident __X__Failure to provide all requested proof of income information for 2021.    Medication assistance will resume once all requested financial information has been returned to our clinic.  If you have questions, please contact our clinic at [phone].    Thank you,  [facility] 

## 2019-12-19 ENCOUNTER — Emergency Department: Payer: Self-pay

## 2019-12-19 ENCOUNTER — Emergency Department
Admission: EM | Admit: 2019-12-19 | Discharge: 2019-12-19 | Disposition: A | Discharge status: Home / Self Care | Payer: Self-pay | Attending: Emergency Medicine | Admitting: Emergency Medicine

## 2019-12-19 ENCOUNTER — Encounter: Payer: Self-pay | Admitting: Emergency Medicine

## 2019-12-19 ENCOUNTER — Other Ambulatory Visit: Payer: Self-pay

## 2019-12-19 DIAGNOSIS — Z9114 Patient's other noncompliance with medication regimen: Secondary | ICD-10-CM | POA: Insufficient documentation

## 2019-12-19 DIAGNOSIS — R569 Unspecified convulsions: Secondary | ICD-10-CM

## 2019-12-19 DIAGNOSIS — G40909 Epilepsy, unspecified, not intractable, without status epilepticus: Secondary | ICD-10-CM | POA: Insufficient documentation

## 2019-12-19 DIAGNOSIS — Z79899 Other long term (current) drug therapy: Secondary | ICD-10-CM | POA: Insufficient documentation

## 2019-12-19 LAB — CBC
HCT: 42.8 % (ref 39.0–52.0)
Hemoglobin: 14.3 g/dL (ref 13.0–17.0)
MCH: 29.4 pg (ref 26.0–34.0)
MCHC: 33.4 g/dL (ref 30.0–36.0)
MCV: 88.1 fL (ref 80.0–100.0)
Platelets: 305 10*3/uL (ref 150–400)
RBC: 4.86 MIL/uL (ref 4.22–5.81)
RDW: 13.7 % (ref 11.5–15.5)
WBC: 8.4 10*3/uL (ref 4.0–10.5)
nRBC: 0 % (ref 0.0–0.2)

## 2019-12-19 LAB — BASIC METABOLIC PANEL
Anion gap: 11 (ref 5–15)
BUN: 18 mg/dL (ref 6–20)
CO2: 23 mmol/L (ref 22–32)
Calcium: 9.1 mg/dL (ref 8.9–10.3)
Chloride: 108 mmol/L (ref 98–111)
Creatinine, Ser: 0.98 mg/dL (ref 0.61–1.24)
GFR calc Af Amer: >60 mL/min (ref >60)
GFR calc non Af Amer: >60 mL/min (ref >60)
Glucose, Bld: 115 mg/dL — ABNORMAL HIGH (ref 70–99)
Potassium: 3.7 mmol/L (ref 3.5–5.1)
Sodium: 142 mmol/L (ref 135–145)

## 2019-12-19 MED ORDER — DIVALPROEX SODIUM ER 250 MG PO TB24
1000.0000 mg | ORAL_TABLET | Freq: Once | ORAL | Status: AC
  Administered 2019-12-19: 13:00:00 1000 mg via ORAL
  Filled 2019-12-19: qty 4

## 2019-12-19 MED ORDER — LORAZEPAM 2 MG/ML IJ SOLN
1.0000 mg | Freq: Once | INTRAMUSCULAR | Status: AC
  Administered 2019-12-19: 1 mg via INTRAVENOUS
  Filled 2019-12-19: qty 1

## 2019-12-19 NOTE — ED Notes (Signed)
Pt to ED after having seizure. Pt has hx/o same. Pt is non-compliant with his medication because he cannot afford them. Pt is currently A & O x 4.

## 2019-12-19 NOTE — ED Triage Notes (Signed)
Pt to ED via [facility] from store for seizure. Pt has hx/o same. Pt has not been taking his medication due to not being able to afford it. Pt is unsure how long he has been off meds but states that this is not his first seizure since being off. Pt was post ictal upon EMS arrival. Pt is currently A & O x 4. Pt has some delayed responses but does answer appropriately.   Pt has abrasion to the left forehead.

## 2019-12-19 NOTE — ED Provider Notes (Signed)
[facility] Emergency Department Provider Note  ____________________________________________   First MD Initiated Contact with Patient 12/19/19 1305     (approximate)  I have reviewed the triage vital signs and the nursing notes.   HISTORY  Chief Complaint Seizures    HPI Lance Ray is a 49 y.o. male with past medical history as below here with witnessed seizure-like activity.  Patient was reportedly at a local store when he was witnessed to fall, striking his head.  He had generalized tonic-clonic activity at the time.  This lasted less than 5 minutes.  He was then mildly confused afterward.  He has a longtime history of epilepsy and frequently not taking his medications.  Has been referred multiple times to medication assistant clinics without following up.  He states he has not had any of his medications since last time he was here, which was February 19.  Denies any recent fevers or chills.  He does not complain of any pain.  No neck pain.  No focal numbness or weakness.  He did not bite his tongue.        Past Medical History:  Diagnosis Date  . Seizures (HCC)     There are no problems to display for this patient.   Past Surgical History:  Procedure Laterality Date  . SKIN GRAFT      Prior to Admission medications   Medication Sig Start Date End Date Taking? Authorizing Provider  divalproex (DEPAKOTE ER) 500 MG 24 hr tablet Take 1 tablet (500 mg total) by mouth daily. 11/14/19 01/13/20  Shaune Pollack, MD  lamoTRIgine (LAMICTAL) 200 MG tablet Take 1 tablet (200 mg total) by mouth 2 (two) times daily. 01/31/19 05/01/19  Menshew, Charlesetta Ivory, PA-C    Allergies Patient has no known allergies.  Family History  Problem Relation Age of Onset  . Seizures Mother     Social History Social History   Tobacco Use  . Smoking status: Never Smoker  . Smokeless tobacco: Current User  Substance Use Topics  . Alcohol use: Yes     Alcohol/week: 2.0 standard drinks    Types: 2 Shots of liquor per week  . Drug use: No    Review of Systems  Review of Systems  Constitutional: Negative for chills, fatigue and fever.  HENT: Negative for sore throat.   Respiratory: Negative for shortness of breath.   Cardiovascular: Negative for chest pain.  Gastrointestinal: Negative for abdominal pain.  Genitourinary: Negative for flank pain.  Musculoskeletal: Negative for neck pain.  Skin: Negative for rash and wound.  Allergic/Immunologic: Negative for immunocompromised state.  Neurological: Positive for seizures and headaches. Negative for weakness and numbness.  Hematological: Does not bruise/bleed easily.  All other systems reviewed and are negative.    ____________________________________________  PHYSICAL EXAM:      VITAL SIGNS: ED Triage Vitals [12/19/19 1307]  Enc Vitals Group     BP 124/73     Pulse Rate (!) 103     Resp 16     Temp 98.8 F (37.1 C)     Temp Source Oral     SpO2 100 %     Weight 190 lb (86.2 kg)     Height 5\' 10"  (1.778 m)     Head Circumference      Peak Flow      Pain Score 0     Pain Loc      Pain Edu?      Excl. in  GC?      Physical Exam Vitals and nursing note reviewed.  Constitutional:      General: He is not in acute distress.    Appearance: He is well-developed.  HENT:     Head: Normocephalic and atraumatic.     Comments: Superficial abrasion to the left forehead.  Mild surrounding swelling.  No periorbital ecchymoses.  No hemotympanum. No tongue lacerations or oral trauma. Eyes:     Conjunctiva/sclera: Conjunctivae normal.  Neck:     Comments: No midline or paraspinal TTP. Cardiovascular:     Rate and Rhythm: Normal rate and regular rhythm.     Heart sounds: Normal heart sounds.  Pulmonary:     Effort: Pulmonary effort is normal. No respiratory distress.     Breath sounds: No wheezing.  Abdominal:     General: There is no distension.  Musculoskeletal:      Cervical back: Neck supple.  Skin:    General: Skin is warm.     Capillary Refill: Capillary refill takes less than 2 seconds.     Findings: No rash.  Neurological:     Mental Status: He is alert and oriented to person, place, and time.     Motor: No abnormal muscle tone.     Comments: Neurological Exam:  Mental Status: Alert and oriented to person, place, and time. Attention and concentration normal. Speech clear. Recent memory is intact. Cranial Nerves: Visual fields grossly intact. EOMI and PERRLA. No nystagmus noted. Facial sensation intact at forehead, maxillary cheek, and chin/mandible bilaterally. No facial asymmetry or weakness. Hearing grossly normal. Uvula is midline, and palate elevates symmetrically. Normal SCM and trapezius strength. Tongue midline without fasciculations. Motor: Muscle strength 5/5 in proximal and distal UE and LE bilaterally. No pronator drift. Muscle tone normal. Sensation: Intact to light touch in upper and lower extremities distally bilaterally.  Gait: Normal without ataxia. Coordination: Normal FTN bilaterally.          ____________________________________________   LABS (all labs ordered are listed, but only abnormal results are displayed)  Labs Reviewed  BASIC METABOLIC PANEL - Abnormal; Notable for the following components:      Result Value   Glucose, Bld 115 (*)    All other components within normal limits  CBC  CBG MONITORING, ED    ____________________________________________  EKG: none ________________________________________  RADIOLOGY All imaging, including plain films, CT scans, and ultrasounds, independently reviewed by me, and interpretations confirmed via formal radiology reads.  ED MD interpretation:   CT Head: NAICA  Official radiology report(s): CT Head Wo Contrast  Result Date: 12/19/2019 CLINICAL DATA:  Seizure EXAM: CT HEAD WITHOUT CONTRAST TECHNIQUE: Contiguous axial images were obtained from the base of the  skull through the vertex without intravenous contrast. COMPARISON:  November 03, 2019 and September 02, 2019 FINDINGS: Brain: Ventricles and sulci are normal in size and configuration. There are symmetric foci decreased attenuation in the medial temporal lobes consistent with sulcal prominence, an anatomic variant. There is no intracranial mass, hemorrhage, extra-axial fluid collection, or midline shift. The brain parenchyma appears unremarkable. No evident acute infarct. Vascular: No hyperdense vessel. No appreciable vascular calcification evident. Skull: Bony calvarium appears intact. Sinuses/Orbits: There is mucosal thickening and opacification involving multiple ethmoid air cells. There is mild mucosal thickening in the inferior left frontal sinus. Other visualized paranasal sinuses are clear. Visualized orbits no appear symmetric bilaterally. Other: Visualized mastoid air cells are clear. IMPRESSION: Brain parenchyma appears unremarkable. No acute infarct. No mass or hemorrhage. Areas  of paranasal sinus disease noted. Electronically Signed   By: Lowella Grip III M.D.   On: 12/19/2019 13:59    ____________________________________________  PROCEDURES   Procedure(s) performed (including Critical Care):  Procedures  ____________________________________________  INITIAL IMPRESSION / MDM / [place] / ED COURSE  As part of my medical decision making, I reviewed the following data within the [place] notes reviewed and incorporated, Old chart reviewed, Notes from prior ED visits, and [place] Controlled Substance Database       *Lance Ray was evaluated in Emergency Department on 12/19/2019 for the symptoms described in the history of present illness. He was evaluated in the context of the global COVID-19 pandemic, which necessitated consideration that the patient might be at risk for infection with the SARS-CoV-2 virus that causes COVID-19. Institutional  protocols and algorithms that pertain to the evaluation of patients at risk for COVID-19 are in a state of rapid change based on information released by regulatory bodies including the CDC and federal and state organizations. These policies and algorithms were followed during the patient's care in the ED.  Some ED evaluations and interventions may be delayed as a result of limited staffing during the pandemic.*     Medical Decision Making:  49 yo M here with witnessed GTC activity in setting of medication nonadherence.  He has a well-documented history of the same.  He has been referred to social work and provided with free medications without willingness to fill them.  His lab work and imaging today is unremarkable.  He was given Ativan as well as a p.o. load of his antiepileptics.  Again, I discussed the importance of adherence, and offered him to fill his medications for free, provide him with transportation to fill his medications, or to switch his medications if he was not tolerating them, which she refuses.  He is back to his mental baseline and ambulatory without difficulty.  Will discharge home.  ____________________________________________  FINAL CLINICAL IMPRESSION(S) / ED DIAGNOSES  Final diagnoses:  Nonadherence to medication  Seizure ([place])     MEDICATIONS GIVEN DURING THIS VISIT:  Medications  LORazepam (ATIVAN) injection 1 mg (1 mg Intravenous Given 12/19/19 1330)  divalproex (DEPAKOTE ER) 24 hr tablet 1,000 mg (1,000 mg Oral Given 12/19/19 1327)     ED Discharge Orders    None       Note:  This document was prepared using Dragon voice recognition software and may include unintentional dictation errors.   Duffy Bruce, MD 12/19/19 [id number]

## 2019-12-29 ENCOUNTER — Emergency Department: Payer: Self-pay

## 2019-12-29 ENCOUNTER — Emergency Department
Admission: EM | Admit: 2019-12-29 | Discharge: 2019-12-29 | Disposition: A | Discharge status: Home / Self Care | Payer: Self-pay | Attending: Emergency Medicine | Admitting: Emergency Medicine

## 2019-12-29 ENCOUNTER — Other Ambulatory Visit: Payer: Self-pay

## 2019-12-29 DIAGNOSIS — Y9389 Activity, other specified: Secondary | ICD-10-CM | POA: Insufficient documentation

## 2019-12-29 DIAGNOSIS — T426X6A Underdosing of other antiepileptic and sedative-hypnotic drugs, initial encounter: Secondary | ICD-10-CM | POA: Insufficient documentation

## 2019-12-29 DIAGNOSIS — Z9114 Patient's other noncompliance with medication regimen: Secondary | ICD-10-CM

## 2019-12-29 DIAGNOSIS — Y999 Unspecified external cause status: Secondary | ICD-10-CM | POA: Insufficient documentation

## 2019-12-29 DIAGNOSIS — Z91120 Patient's intentional underdosing of medication regimen due to financial hardship: Secondary | ICD-10-CM | POA: Insufficient documentation

## 2019-12-29 DIAGNOSIS — W1839XA Other fall on same level, initial encounter: Secondary | ICD-10-CM | POA: Insufficient documentation

## 2019-12-29 DIAGNOSIS — R569 Unspecified convulsions: Secondary | ICD-10-CM | POA: Insufficient documentation

## 2019-12-29 DIAGNOSIS — Y92410 Unspecified street and highway as the place of occurrence of the external cause: Secondary | ICD-10-CM | POA: Insufficient documentation

## 2019-12-29 DIAGNOSIS — S0001XA Abrasion of scalp, initial encounter: Secondary | ICD-10-CM | POA: Insufficient documentation

## 2019-12-29 DIAGNOSIS — G40909 Epilepsy, unspecified, not intractable, without status epilepticus: Secondary | ICD-10-CM

## 2019-12-29 LAB — CBC WITH DIFFERENTIAL/PLATELET
Abs Immature Granulocytes: 0.02 10*3/uL (ref 0.00–0.07)
Basophils Absolute: 0 10*3/uL (ref 0.0–0.1)
Basophils Relative: 0 %
Eosinophils Absolute: 0.2 10*3/uL (ref 0.0–0.5)
Eosinophils Relative: 3 %
HCT: 39.3 % (ref 39.0–52.0)
Hemoglobin: 13.2 g/dL (ref 13.0–17.0)
Immature Granulocytes: 0 %
Lymphocytes Relative: 21 %
Lymphs Abs: 1.9 10*3/uL (ref 0.7–4.0)
MCH: 29.7 pg (ref 26.0–34.0)
MCHC: 33.6 g/dL (ref 30.0–36.0)
MCV: 88.3 fL (ref 80.0–100.0)
Monocytes Absolute: 0.7 10*3/uL (ref 0.1–1.0)
Monocytes Relative: 7 %
Neutro Abs: 6.2 10*3/uL (ref 1.7–7.7)
Neutrophils Relative %: 69 %
Platelets: 303 10*3/uL (ref 150–400)
RBC: 4.45 MIL/uL (ref 4.22–5.81)
RDW: 13.6 % (ref 11.5–15.5)
WBC: 9 10*3/uL (ref 4.0–10.5)
nRBC: 0 % (ref 0.0–0.2)

## 2019-12-29 LAB — BASIC METABOLIC PANEL
Anion gap: 13 (ref 5–15)
BUN: 16 mg/dL (ref 6–20)
CO2: 22 mmol/L (ref 22–32)
Calcium: 8.7 mg/dL — ABNORMAL LOW (ref 8.9–10.3)
Chloride: 105 mmol/L (ref 98–111)
Creatinine, Ser: 1.04 mg/dL (ref 0.61–1.24)
GFR calc Af Amer: >60 mL/min (ref >60)
GFR calc non Af Amer: >60 mL/min (ref >60)
Glucose, Bld: 109 mg/dL — ABNORMAL HIGH (ref 70–99)
Potassium: 3.3 mmol/L — ABNORMAL LOW (ref 3.5–5.1)
Sodium: 140 mmol/L (ref 135–145)

## 2019-12-29 MED ORDER — LORAZEPAM 2 MG/ML IJ SOLN
1.0000 mg | Freq: Once | INTRAMUSCULAR | Status: AC
  Administered 2019-12-29: 15:00:00 1 mg via INTRAVENOUS
  Filled 2019-12-29: qty 1

## 2019-12-29 MED ORDER — DIVALPROEX SODIUM 500 MG PO DR TAB
1000.0000 mg | DELAYED_RELEASE_TABLET | Freq: Once | ORAL | Status: AC
  Administered 2019-12-29: 1000 mg via ORAL
  Filled 2019-12-29: qty 2

## 2019-12-29 NOTE — ED Notes (Signed)
Left message for pt's sister to call me back per pt request. Pt attempted to call sister but states he was having trouble with the telephone.

## 2019-12-29 NOTE — ED Notes (Signed)
Pt given phone to call sister for ride home 

## 2019-12-29 NOTE — ED Provider Notes (Signed)
[facility] Emergency Department Provider Note   ____________________________________________   First MD Initiated Contact with Patient 12/29/19 1428     (approximate)  I have reviewed the triage vital signs and the nursing notes.   HISTORY  Chief Complaint Seizures    HPI Lance Ray is a 49 y.o. male with past medical history of seizures who presents to the ED for seizure.  History is limited as patient was found unresponsive on the side of the road by bystanders.  EMS was called and patient appeared postictal, noted to have a long history of seizures and medication noncompliance by EMS and based off prior visits.  Patient gradually returned to his baseline mental status during transport and is now awake and alert, answering questions appropriately.  He admits to not taking any medications for his seizure since his last ED visit.  He states he has a prescription for Depakote but he has been unable to fill it as he cannot afford it.  He states he is otherwise been feeling well with no fevers, cough, chest pain, or shortness of breath.        Past Medical History:  Diagnosis Date  . Seizures (HCC)     There are no problems to display for this patient.   Past Surgical History:  Procedure Laterality Date  . SKIN GRAFT      Prior to Admission medications   Medication Sig Start Date End Date Taking? Authorizing Provider  divalproex (DEPAKOTE ER) 500 MG 24 hr tablet Take 1 tablet (500 mg total) by mouth daily. Patient not taking: Reported on 12/29/2019 11/14/19 01/13/20  Shaune Pollack, MD  lamoTRIgine (LAMICTAL) 200 MG tablet Take 1 tablet (200 mg total) by mouth 2 (two) times daily. Patient not taking: Reported on 12/29/2019 01/31/19 05/01/19  Menshew, Charlesetta Ivory, PA-C    Allergies Patient has no known allergies.  Family History  Problem Relation Age of Onset  . Seizures Mother     Social History Social History   Tobacco Use  . Smoking  status: Never Smoker  . Smokeless tobacco: Current User  Substance Use Topics  . Alcohol use: Yes    Alcohol/week: 2.0 standard drinks    Types: 2 Shots of liquor per week  . Drug use: No    Review of Systems  Constitutional: No fever/chills Eyes: No visual changes. ENT: No sore throat. Cardiovascular: Denies chest pain. Respiratory: Denies shortness of breath. Gastrointestinal: No abdominal pain.  No nausea, no vomiting.  No diarrhea.  No constipation. Genitourinary: Negative for dysuria. Musculoskeletal: Negative for back pain. Skin: Negative for rash. Neurological: Negative for headaches, focal weakness or numbness.  Positive for seizure.  ____________________________________________   PHYSICAL EXAM:  VITAL SIGNS: ED Triage Vitals  Enc Vitals Group     BP 12/29/19 1427 115/77     Pulse Rate 12/29/19 1427 97     Resp 12/29/19 1427 18     Temp 12/29/19 1427 98.3 F (36.8 C)     Temp Source 12/29/19 1427 Oral     SpO2 12/29/19 1427 97 %     Weight 12/29/19 1428 190 lb (86.2 kg)     Height 12/29/19 1428 5\' 9"  (1.753 m)     Head Circumference --      Peak Flow --      Pain Score 12/29/19 1428 0     Pain Loc --      Pain Edu? --      Excl. in  GC? --     Constitutional: Alert and oriented. Eyes: Conjunctivae are normal. Head: Abrasion to left frontal scalp. Nose: No congestion/rhinnorhea. Mouth/Throat: Mucous membranes are moist. Neck: Normal ROM Cardiovascular: Normal rate, regular rhythm. Grossly normal heart sounds. Respiratory: Normal respiratory effort.  No retractions. Lungs CTAB. Gastrointestinal: Soft and nontender. No distention. Genitourinary: deferred Musculoskeletal: No lower extremity tenderness nor edema. Neurologic:  Normal speech and language. No gross focal neurologic deficits are appreciated. Skin:  Skin is warm, dry and intact. No rash noted. Psychiatric: Mood and affect are normal. Speech and behavior are  normal.  ____________________________________________   LABS (all labs ordered are listed, but only abnormal results are displayed)  Labs Reviewed  BASIC METABOLIC PANEL - Abnormal; Notable for the following components:      Result Value   Potassium 3.3 (*)    Glucose, Bld 109 (*)    Calcium 8.7 (*)    All other components within normal limits  CBC WITH DIFFERENTIAL/PLATELET   ____________________________________________  EKG  ED ECG REPORT I, Blake Divine, the attending physician, personally viewed and interpreted this ECG.   Date: 12/29/2019  EKG Time: 14:52  Rate: 82  Rhythm: normal sinus rhythm  Axis: Normal  Intervals:none  ST&T Change: None   PROCEDURES  Procedure(s) performed (including Critical Care):  Procedures   ____________________________________________   INITIAL IMPRESSION / ASSESSMENT AND PLAN / ED COURSE       49 year old male with history of seizures and medication noncompliance presents to the ED for presumed seizure episode after he was found disoriented on the side of the road.  His mental status has gradually improved and he now appears to have returned to his baseline.  He has no focal neurologic deficits on exam, but does have abrasion to his left frontal scalp.  CT head was performed and negative for acute process, lab work unremarkable.  Patient given dose of Ativan as well as p.o. load of Depakote.  Social work has been involved multiple times in the past for financial assistance, but patient continues to refuse to fill his prescriptions.  If he remains seizure-free here in the ED, he will be appropriate for discharge home, I have encouraged him to follow-up with neurology.  Patient turned over to oncoming provider pending reevaluation.      ____________________________________________   FINAL CLINICAL IMPRESSION(S) / ED DIAGNOSES  Final diagnoses:  Seizure disorder ([place])  History of medication noncompliance     ED Discharge  Orders    None       Note:  This document was prepared using Dragon voice recognition software and may include unintentional dictation errors.   Blake Divine, MD 12/29/19 1534

## 2019-12-29 NOTE — ED Notes (Addendum)
Attempted to call pt's sister Aram Beecham per request  but line went straight to voicemail and I could not leave a message

## 2019-12-29 NOTE — ED Triage Notes (Signed)
Pt arrives via [facility] after being found unresponsive on the side of the road by bystander. PT reports hx of seizures with last seizure 2 months ago. Pt reports he has not taken his seizure medications in about 2 months because he has not had the money to fill scripts. Pt A&Ox4 and in NAD, reports no pain at this time. Pt has red area to left side of forehead, states that he may have "rebumped it this time". No bleeding noted.

## 2019-12-29 NOTE — ED Notes (Signed)
Attempted to call pt's sister [name] again but did not get an answer

## 2019-12-29 NOTE — ED Notes (Signed)
Attempted to give pt depakote that was ordered. Pt would arouse when touched or spoken loudly to but then drifts back off to sleep and is not following this RN's commands, will inform EDP

## 2019-12-29 NOTE — ED Notes (Signed)
Pt seems more awake than previous at this time. Pt responding to this RN and following commands.

## 2020-01-03 ENCOUNTER — Emergency Department
Admission: EM | Admit: 2020-01-03 | Discharge: 2020-01-03 | Discharge status: Against Medical Advice | Payer: Self-pay | Attending: Emergency Medicine | Admitting: Emergency Medicine

## 2020-01-03 ENCOUNTER — Other Ambulatory Visit: Payer: Self-pay

## 2020-01-03 DIAGNOSIS — R569 Unspecified convulsions: Secondary | ICD-10-CM | POA: Insufficient documentation

## 2020-01-03 LAB — CBC
HCT: 40.5 % (ref 39.0–52.0)
Hemoglobin: 13.4 g/dL (ref 13.0–17.0)
MCH: 29.6 pg (ref 26.0–34.0)
MCHC: 33.1 g/dL (ref 30.0–36.0)
MCV: 89.6 fL (ref 80.0–100.0)
Platelets: 263 10*3/uL (ref 150–400)
RBC: 4.52 MIL/uL (ref 4.22–5.81)
RDW: 13.8 % (ref 11.5–15.5)
WBC: 5.7 10*3/uL (ref 4.0–10.5)
nRBC: 0 % (ref 0.0–0.2)

## 2020-01-03 LAB — BASIC METABOLIC PANEL
Anion gap: 8 (ref 5–15)
BUN: 12 mg/dL (ref 6–20)
CO2: 25 mmol/L (ref 22–32)
Calcium: 8.7 mg/dL — ABNORMAL LOW (ref 8.9–10.3)
Chloride: 108 mmol/L (ref 98–111)
Creatinine, Ser: 0.98 mg/dL (ref 0.61–1.24)
GFR calc Af Amer: >60 mL/min (ref >60)
GFR calc non Af Amer: >60 mL/min (ref >60)
Glucose, Bld: 93 mg/dL (ref 70–99)
Potassium: 4 mmol/L (ref 3.5–5.1)
Sodium: 141 mmol/L (ref 135–145)

## 2020-01-03 MED ORDER — DIVALPROEX SODIUM ER 250 MG PO TB24
500.0000 mg | ORAL_TABLET | Freq: Every day | ORAL | Status: DC
  Administered 2020-01-03: 500 mg via ORAL
  Filled 2020-01-03: qty 2

## 2020-01-03 NOTE — ED Notes (Signed)
ED Provider at bedside. 

## 2020-01-03 NOTE — ED Provider Notes (Signed)
Delay in seeing and evaluating patient secondary to multiple patients.  Critical patients.  Patient is resting comfortably in the hallway, I did briefly discuss with him and he reports he ran out of seizure medicine which she thinks caused him to have a seizure today.  He confirms his dose of Depakote, I have written for this and the patient will be seen shortly for full ER evaluation.  He is in no distress, resting comfortably   Sharyn Creamer, MD 01/03/20 1450

## 2020-01-03 NOTE — ED Triage Notes (Signed)
Pt arrives to ED from a restaurant where pt was picking up take out. Pt remembers being at restaurant. Last seizure was last week. Pt states he is out of his seizure meds, "can't afford them." last dose was last week. A&O upon arrival. Did not soil self. Hematoma noted to L forehead, no active bleeding.   EMS VS 140/80, HR 103, CBG 128.

## 2020-01-03 NOTE — Discharge Instructions (Addendum)
We recommended CT imaging and you decided to leave AGAINST MEDICAL ADVICE.  You can return to the ER if you develop any other new symptoms or have any other concerns.  You should follow-up with Phineas Real discussed your seizure medicine

## 2020-01-03 NOTE — ED Provider Notes (Signed)
[facility] Emergency Department Provider Note  ____________________________________________   First MD Initiated Contact with Patient 01/03/20 1456     (approximate)  I have reviewed the triage vital signs and the nursing notes.   HISTORY  Chief Complaint Seizures    HPI Lance Ray is a 49 y.o. male with history of seizures who comes in for concern for seizure.  Patient has been here multiple times for seizures.  Patient states that he remembers going to have lunch and then remembers waking up in the ambulance.  Patient is not compliant with his Depakote due to cost.  Last him I saw patient in October I had social work come and talk to him about primary care doctors and places to go without insurance.  Patient stated that he never followed up with that.  Patient is not interested in another prescription at this time because he says that he will not fill it because he cannot afford it.  Patient states that he feels at his baseline self now.  He does have a hematoma left side of his head.  Seizure occurred 1 time, unclear how long it lasted or if he fell from standing.  Reportedly stopped on its own.  Brought upon by medication noncompliance          Past Medical History:  Diagnosis Date  . Seizures (HCC)     There are no problems to display for this patient.   Past Surgical History:  Procedure Laterality Date  . SKIN GRAFT      Prior to Admission medications   Medication Sig Start Date End Date Taking? Authorizing Provider  divalproex (DEPAKOTE ER) 500 MG 24 hr tablet Take 1 tablet (500 mg total) by mouth daily. Patient not taking: Reported on 12/29/2019 11/14/19 01/13/20  Shaune Pollack, MD  lamoTRIgine (LAMICTAL) 200 MG tablet Take 1 tablet (200 mg total) by mouth 2 (two) times daily. Patient not taking: Reported on 12/29/2019 01/31/19 05/01/19  Menshew, Charlesetta Ivory, PA-C    Allergies Patient has no known allergies.  Family History   Problem Relation Age of Onset  . Seizures Mother     Social History Social History   Tobacco Use  . Smoking status: Never Smoker  . Smokeless tobacco: Current User  Substance Use Topics  . Alcohol use: Yes    Alcohol/week: 2.0 standard drinks    Types: 2 Shots of liquor per week  . Drug use: No      Review of Systems Constitutional: No fever/chills Eyes: No visual changes. ENT: No sore throat. Cardiovascular: Denies chest pain. Respiratory: Denies shortness of breath. Gastrointestinal: No abdominal pain.  No nausea, no vomiting.  No diarrhea.  No constipation. Genitourinary: Negative for dysuria. Musculoskeletal: Negative for back pain. Skin: Negative for rash. Neurological: Negative for headaches, focal weakness or numbness.  Seizure All other ROS negative ____________________________________________   PHYSICAL EXAM:  VITAL SIGNS: ED Triage Vitals  Enc Vitals Group     BP 01/03/20 1350 125/88     Pulse Rate 01/03/20 1350 88     Resp 01/03/20 1350 16     Temp 01/03/20 1350 98.5 F (36.9 C)     Temp Source 01/03/20 1350 Oral     SpO2 01/03/20 1350 100 %     Weight 01/03/20 1351 180 lb (81.6 kg)     Height 01/03/20 1351 5\' 9"  (1.753 m)     Head Circumference --      Peak Flow --  Pain Score 01/03/20 1351 0     Pain Loc --      Pain Edu? --      Excl. in GC? --     Constitutional: Alert and oriented. Well appearing and in no acute distress. Eyes: Conjunctivae are normal. EOMI. Head: Hematoma to the left side of his head Nose: No congestion/rhinnorhea. Mouth/Throat: Mucous membranes are moist.   Neck: No stridor. Trachea Midline. FROM.  No C-spine tenderness Cardiovascular: Normal rate, regular rhythm. Grossly normal heart sounds.  Good peripheral circulation.  No chest wall tenderness Respiratory: Normal respiratory effort.  No retractions. Lungs CTAB. Gastrointestinal: Soft and nontender. No distention. No abdominal bruits.  Musculoskeletal: No  lower extremity tenderness nor edema.  No joint effusions. Neurologic:  Normal speech and language..  Nerves II through XII are intact.  Moving all extremities well.  Skin:  Skin is warm, dry and intact. No rash noted. Psychiatric: Mood and affect are normal. Speech and behavior are normal. GU: Deferred   ____________________________________________   LABS (all labs ordered are listed, but only abnormal results are displayed)  Labs Reviewed  BASIC METABOLIC PANEL - Abnormal; Notable for the following components:      Result Value   Calcium 8.7 (*)    All other components within normal limits  CBC   ____________________________________________   ED ECG REPORT I, Concha Se, the attending physician, personally viewed and interpreted this ECG.  EKG is normal sinus rate of 62, no ST elevation, no T wave inversions, normal intervals ____________________________________________  RADIOLOGY Declined imaging.  ____________________________________________   PROCEDURES  Procedure(s) performed (including Critical Care):  Procedures   ____________________________________________   INITIAL IMPRESSION / ASSESSMENT AND PLAN / ED COURSE  Lance Ray was evaluated in Emergency Department on 01/03/2020 for the symptoms described in the history of present illness. He was evaluated in the context of the global COVID-19 pandemic, which necessitated consideration that the patient might be at risk for infection with the SARS-CoV-2 virus that causes COVID-19. Institutional protocols and algorithms that pertain to the evaluation of patients at risk for COVID-19 are in a state of rapid change based on information released by regulatory bodies including the CDC and federal and state organizations. These policies and algorithms were followed during the patient's care in the ED.    Patient is a 49 year old who comes in with seizures.  Patient is at his baseline mental status at this time.  He  has no focal neuro deficits.  Patient does have a hematoma in the left side of his head therefore given unclear mechanism I did recommend a CT scan of the head to rule intracranial hemorrhage.  I discussed with patient that due to the [place] CT head rule that we would recommend CT scan.  Patient does not want to have a CT scan.  Patient understands the risk.  He states that his dad died of a cerebral hemorrhage.  He understands that he could have death or permanent disability.  He understands that this is AGAINST MEDICAL ADVICE.  Patient stating that he would like to go home at this time.  Patient has capacity to make this decision.  He is alert and oriented and not postictal.  He has had no recurrent seizure here.  Patient given information to follow-up with Phineas Real.  Labs were ordered and there are no signs of electrolyte abnormalities, AKI, anemia.  Patient was given a dose of Depakote. Declines prescriptions.        ____________________________________________  FINAL CLINICAL IMPRESSION(S) / ED DIAGNOSES   Final diagnoses:  Seizure ([place])      MEDICATIONS GIVEN DURING THIS VISIT:  Medications  divalproex (DEPAKOTE ER) 24 hr tablet 500 mg (500 mg Oral Given 01/03/20 1504)     ED Discharge Orders    None       Note:  This document was prepared using Dragon voice recognition software and may include unintentional dictation errors.   Vanessa [place], MD 01/03/20 [id number]

## 2020-01-16 ENCOUNTER — Emergency Department
Admission: EM | Admit: 2020-01-16 | Discharge: 2020-01-16 | Disposition: A | Discharge status: Home / Self Care | Payer: Self-pay | Attending: Student in an Organized Health Care Education/Training Program | Admitting: Student in an Organized Health Care Education/Training Program

## 2020-01-16 ENCOUNTER — Other Ambulatory Visit: Payer: Self-pay

## 2020-01-16 ENCOUNTER — Encounter: Payer: Self-pay | Admitting: Emergency Medicine

## 2020-01-16 DIAGNOSIS — R569 Unspecified convulsions: Secondary | ICD-10-CM | POA: Insufficient documentation

## 2020-01-16 DIAGNOSIS — F17228 Nicotine dependence, chewing tobacco, with other nicotine-induced disorders: Secondary | ICD-10-CM | POA: Insufficient documentation

## 2020-01-16 MED ORDER — LAMOTRIGINE 200 MG PO TABS: 200.0000 mg | ORAL_TABLET | Freq: Two times a day (BID) | ORAL | 2 refills | Status: DC

## 2020-01-16 MED ORDER — DIVALPROEX SODIUM ER 250 MG PO TB24
500.0000 mg | ORAL_TABLET | Freq: Every day | ORAL | Status: DC
  Administered 2020-01-16: 18:00:00 500 mg via ORAL
  Filled 2020-01-16: qty 2

## 2020-01-16 MED ORDER — DIVALPROEX SODIUM ER 500 MG PO TB24: 500.0000 mg | ORAL_TABLET | Freq: Every day | ORAL | 1 refills | Status: DC

## 2020-01-16 MED ORDER — LAMOTRIGINE 100 MG PO TABS
200.0000 mg | ORAL_TABLET | Freq: Every day | ORAL | Status: DC
  Administered 2020-01-16: 200 mg via ORAL
  Filled 2020-01-16: qty 2

## 2020-01-16 NOTE — Clinical Social Work Note (Signed)
[facility]  [employer], [address], [place], [place] [employer], [address] [place], [address], [place], [place]  [facility]         [address], [place], [place] [address] [place], [place], [place]    [address], [place], [place] [address], [place], [place]    [address] [place], [place] [address] [place], [place] [address] [place], [place] [address] [place] [address] [place] [address], [place], [place]  [facility]    [address], [place], [place] [address] [place], [place] [address], [place], [place] [address], [place], [place] [address], [place], [place]  [facility] [address] [place], [place] [address] [address] [place], [place], [place] [address] [place], [place], [place] [address] [place], [place], [place]

## 2020-01-16 NOTE — ED Provider Notes (Signed)
[facility] Emergency Department Provider Note    First MD Initiated Contact with Patient 01/16/20 1804     (approximate)  I have reviewed the triage vital signs and the nursing notes.   HISTORY  Chief Complaint Seizures    HPI Lance Ray is a 49 y.o. male who is well-known to this facility presents to the ER via EMS for witnessed seizure.  Patient admits he has been noncompliant with his medications.  Does not seem to really have much of an explanation as to why he is not pick them up.  Cannot really provide any explanation to specific barriers preventing him from taking these medications.  States that he would take them if he had them.  He denies any numbness or tingling.  No headache no chest pain no palpitations.  No nausea or vomiting.  No fever.    Past Medical History:  Diagnosis Date  . Seizures (HCC)    Family History  Problem Relation Age of Onset  . Seizures Mother    Past Surgical History:  Procedure Laterality Date  . SKIN GRAFT     There are no problems to display for this patient.     Prior to Admission medications   Medication Sig Start Date End Date Taking? Authorizing Provider  divalproex (DEPAKOTE ER) 500 MG 24 hr tablet Take 1 tablet (500 mg total) by mouth daily. 01/16/20 03/16/20  Willy Eddy, MD  lamoTRIgine (LAMICTAL) 200 MG tablet Take 1 tablet (200 mg total) by mouth 2 (two) times daily. 01/16/20 04/15/20  Willy Eddy, MD    Allergies Patient has no known allergies.    Social History Social History   Tobacco Use  . Smoking status: Never Smoker  . Smokeless tobacco: Current User  Substance Use Topics  . Alcohol use: Yes    Alcohol/week: 2.0 standard drinks    Types: 2 Shots of liquor per week  . Drug use: No    Review of Systems Patient denies headaches, rhinorrhea, blurry vision, numbness, shortness of breath, chest pain, edema, cough, abdominal pain, nausea, vomiting, diarrhea, dysuria,  fevers, rashes or hallucinations unless otherwise stated above in HPI. ____________________________________________   PHYSICAL EXAM:  VITAL SIGNS: Vitals:   01/16/20 1922 01/16/20 1930  BP:  (!) 139/97  Pulse:  91  Resp: 16   Temp:    SpO2:  100%    Constitutional: Alert and oriented.  Eyes: Conjunctivae are normal.  Head: Atraumatic. Nose: No congestion/rhinnorhea. Mouth/Throat: Mucous membranes are moist.   Neck: No stridor. Painless ROM.  Cardiovascular: Normal rate, regular rhythm. Grossly normal heart sounds.  Good peripheral circulation. Respiratory: Normal respiratory effort.  No retractions. Lungs CTAB. Gastrointestinal: Soft and nontender. No distention. No abdominal bruits. No CVA tenderness. Genitourinary:  Musculoskeletal: No lower extremity tenderness nor edema.  No joint effusions. Neurologic:  Normal speech and language. No gross focal neurologic deficits are appreciated. No facial droop Skin:  Skin is warm, dry and intact. No rash noted. Psychiatric: Mood and affect are normal. Speech and behavior are normal.  ____________________________________________   LABS (all labs ordered are listed, but only abnormal results are displayed)  No results found for this or any previous visit (from the past 24 hour(s)). ____________________________________________ ____________________________________________   PROCEDURES  Procedure(s) performed:  Procedures    Critical Care performed: no ____________________________________________   INITIAL IMPRESSION / ASSESSMENT AND PLAN / ED COURSE  Pertinent labs & imaging results that were available during my care of the patient were  reviewed by me and considered in my medical decision making (see chart for details).   DDX: Seizure, epilepsy, status, noncompliance  Lance Ray is a 49 y.o. who presents to the ED with witnessed seizure activity.  Patient well-known to this facility with multiple presentations for  similar episodes almost always being related to the patient's noncompliance with his medication.  Will observe in the ER.  Will give his medication.  Will discuss with social worker case management to see if there are any other avenues to help improve this patient's compliance with his medications.  Clinical Course as of Jan 15 2013  Fri Jan 16, 2020  2011 Has been observed in the ER for 2 hours without any recurrent seizures.  Consult social work and case management to help see if we can coordinate make sure the patient is able to get his medications.  Patient has no new complaints.  Repeat neuro exam is reassuring.  I will see indication for any additional lab work or diagnostic testing.  Have discussed with the patient and available family all diagnostics and treatments performed thus far and all questions were answered to the best of my ability. The patient demonstrates understanding and agreement with plan.    [PR]    Clinical Course User Index [PR] Merlyn Lot, MD    The patient was evaluated in Emergency Department today for the symptoms described in the history of present illness. He/she was evaluated in the context of the global COVID-19 pandemic, which necessitated consideration that the patient might be at risk for infection with the SARS-CoV-2 virus that causes COVID-19. Institutional protocols and algorithms that pertain to the evaluation of patients at risk for COVID-19 are in a state of rapid change based on information released by regulatory bodies including the CDC and federal and state organizations. These policies and algorithms were followed during the patient's care in the ED.  As part of my medical decision making, I reviewed the following data within the [place] notes reviewed and incorporated, Labs reviewed, notes from prior ED visits and [place] Controlled Substance Database   ____________________________________________   FINAL CLINICAL  IMPRESSION(S) / ED DIAGNOSES  Final diagnoses:  Seizure ([place])      NEW MEDICATIONS STARTED DURING THIS VISIT:  Current Discharge Medication List       Note:  This document was prepared using Dragon voice recognition software and may include unintentional dictation errors.    Merlyn Lot, MD 01/16/20 2014

## 2020-01-16 NOTE — ED Notes (Signed)
Social worker called at this time to explain voucher to pt- phone given to pt.

## 2020-01-16 NOTE — Care Management (Signed)
[facility] ED CM received message from [facility] CSW patient is needing medication assistance by [facility],  Patient enrolled in [facility] program letter placed in patient's medical record, CM made [facility] CSW aware of letter in chart to be printed off and given to patient.

## 2020-01-16 NOTE — ED Triage Notes (Signed)
Pt ems from store for seizure. Pt A/O at this time. Per pt seen here recently for same. Pt is not taking any meds at this time.

## 2020-01-16 NOTE — ED Notes (Signed)
Pt assisted to call sister for ride.

## 2020-01-16 NOTE — Care Management (Signed)
  MATCH Medication Assistance Card Name: Cyree Chuong ID (MRN): [id number] Bin: [id number] RX Group: [id number] Discharge Date: 01/16/2020 Expiration Date:  01/26/2020                                           (must be filled within 7 days of discharge)       Dear   :  Kathryne Gin  You have been approved to have the prescriptions written by your discharging physician filled through our [facility] (Medication Assistance Through [facility]) program. This program allows for a one-time (no refills) 34-day supply of selected medications for a low copay amount.  The copay is $3.00 per prescription. For instance, if you have one prescription, you will pay $3.00; for two prescriptions, you pay $6.00; for three prescriptions, you pay $9.00; and so on.  Only certain pharmacies are participating in this program with [facility]. You will need to select one of the pharmacies from the attached list and take your prescriptions, this letter, and your photo ID to one of the participating pharmacies.   We are excited that you are able to use the [facility] program to get your medications. These prescriptions must be filled within 7 days of hospital discharge or they will no longer be valid for the [facility] program. Should you have any problems with your prescriptions please contact your case management team member at [phone] for [place]/ [facility].  Thank you, [facility] Care Management

## 2020-01-16 NOTE — Clinical Social Work Note (Signed)
Transition of Care [facility]) - Emergency Department Mini Assessment  Patient Details  Name: Lance Ray MRN: [id number] Date of Birth: 1970/12/01  Transition of [facility]) CM/SW Contact:    Ewing Schlein, LCSW Phone Number: 01/16/2020, 7:39 PM  Clinical Narrative: TOC received consult for medication assistance. CSW and RN CM assisted with completing Match voucher for patient. CSW called patient to explain voucher (must be filled and picked up within 7 days of discharge) and provided list of participating pharmacies. TOC signing off.   ED Mini Assessment: What brought you to the Emergency Department? : Seizures Barriers to Discharge: Barriers Resolved Barrier interventions: Match voucher and participating pharmacies provided Means of departure: Car Interventions which prevented an admission or readmission: Medication Review  Patient Contact and Communications Key Contact 1: Gaston Islam (sister) Contact Phone Number: [phone] Call outcome: RN assisted patient with calling his sister to pick him up from ED  Admission diagnosis:  Seizures There are no problems to display for this patient.  PCP:  Patient, No Pcp Per Pharmacy:   [facility], [place] - [address] [place] [address] Phone: [phone] Fax: [phone]  [id number] - [place], [place] - [address] [place] [address] Phone: [phone] Fax: [phone]  [facility] [place], [place] - [address] [place] [address] Phone: [phone] Fax: [phone]

## 2020-03-23 ENCOUNTER — Telehealth: Payer: Self-pay | Admitting: General Practice

## 2020-03-23 NOTE — Telephone Encounter (Signed)
Attempts have been made to contact individual. No valid phone number on file.

## 2020-03-24 ENCOUNTER — Emergency Department
Admission: EM | Admit: 2020-03-24 | Discharge: 2020-03-24 | Disposition: A | Discharge status: Home / Self Care | Payer: Self-pay | Attending: Emergency Medicine | Admitting: Emergency Medicine

## 2020-03-24 ENCOUNTER — Emergency Department: Payer: Self-pay

## 2020-03-24 ENCOUNTER — Other Ambulatory Visit: Payer: Self-pay

## 2020-03-24 ENCOUNTER — Inpatient Hospital Stay
Admission: EM | Admit: 2020-03-24 | Discharge: 2020-04-09 | DRG: 100 | Disposition: A | Discharge status: Other Acute Inpatient Hospital | Payer: Self-pay | Attending: Internal Medicine | Admitting: Internal Medicine

## 2020-03-24 DIAGNOSIS — Z91138 Patient's unintentional underdosing of medication regimen for other reason: Secondary | ICD-10-CM

## 2020-03-24 DIAGNOSIS — R270 Ataxia, unspecified: Secondary | ICD-10-CM | POA: Diagnosis not present

## 2020-03-24 DIAGNOSIS — Z79899 Other long term (current) drug therapy: Secondary | ICD-10-CM

## 2020-03-24 DIAGNOSIS — F609 Personality disorder, unspecified: Secondary | ICD-10-CM | POA: Diagnosis present

## 2020-03-24 DIAGNOSIS — R569 Unspecified convulsions: Secondary | ICD-10-CM

## 2020-03-24 DIAGNOSIS — Z20822 Contact with and (suspected) exposure to covid-19: Secondary | ICD-10-CM | POA: Diagnosis present

## 2020-03-24 DIAGNOSIS — Z781 Physical restraint status: Secondary | ICD-10-CM

## 2020-03-24 DIAGNOSIS — G40909 Epilepsy, unspecified, not intractable, without status epilepticus: Secondary | ICD-10-CM

## 2020-03-24 DIAGNOSIS — Z9119 Patient's noncompliance with other medical treatment and regimen: Secondary | ICD-10-CM

## 2020-03-24 DIAGNOSIS — R030 Elevated blood-pressure reading, without diagnosis of hypertension: Secondary | ICD-10-CM

## 2020-03-24 DIAGNOSIS — S0003XA Contusion of scalp, initial encounter: Secondary | ICD-10-CM | POA: Diagnosis present

## 2020-03-24 DIAGNOSIS — W1839XA Other fall on same level, initial encounter: Secondary | ICD-10-CM | POA: Diagnosis present

## 2020-03-24 DIAGNOSIS — F22 Delusional disorders: Secondary | ICD-10-CM | POA: Diagnosis present

## 2020-03-24 DIAGNOSIS — Z87820 Personal history of traumatic brain injury: Secondary | ICD-10-CM

## 2020-03-24 DIAGNOSIS — F17200 Nicotine dependence, unspecified, uncomplicated: Secondary | ICD-10-CM | POA: Insufficient documentation

## 2020-03-24 DIAGNOSIS — F10239 Alcohol dependence with withdrawal, unspecified: Secondary | ICD-10-CM | POA: Diagnosis not present

## 2020-03-24 DIAGNOSIS — S0001XA Abrasion of scalp, initial encounter: Secondary | ICD-10-CM | POA: Diagnosis present

## 2020-03-24 DIAGNOSIS — F332 Major depressive disorder, recurrent severe without psychotic features: Secondary | ICD-10-CM | POA: Diagnosis present

## 2020-03-24 DIAGNOSIS — I959 Hypotension, unspecified: Secondary | ICD-10-CM | POA: Diagnosis not present

## 2020-03-24 DIAGNOSIS — R6510 Systemic inflammatory response syndrome (SIRS) of non-infectious origin without acute organ dysfunction: Secondary | ICD-10-CM | POA: Diagnosis present

## 2020-03-24 DIAGNOSIS — Z818 Family history of other mental and behavioral disorders: Secondary | ICD-10-CM

## 2020-03-24 DIAGNOSIS — G40409 Other generalized epilepsy and epileptic syndromes, not intractable, without status epilepticus: Principal | ICD-10-CM | POA: Diagnosis present

## 2020-03-24 DIAGNOSIS — F4320 Adjustment disorder, unspecified: Secondary | ICD-10-CM | POA: Diagnosis present

## 2020-03-24 DIAGNOSIS — G4089 Other seizures: Secondary | ICD-10-CM | POA: Diagnosis present

## 2020-03-24 DIAGNOSIS — I10 Essential (primary) hypertension: Secondary | ICD-10-CM | POA: Diagnosis present

## 2020-03-24 DIAGNOSIS — G9341 Metabolic encephalopathy: Secondary | ICD-10-CM | POA: Diagnosis not present

## 2020-03-24 DIAGNOSIS — T426X6A Underdosing of other antiepileptic and sedative-hypnotic drugs, initial encounter: Secondary | ICD-10-CM | POA: Diagnosis present

## 2020-03-24 DIAGNOSIS — R471 Dysarthria and anarthria: Secondary | ICD-10-CM | POA: Diagnosis not present

## 2020-03-24 DIAGNOSIS — Y92512 Supermarket, store or market as the place of occurrence of the external cause: Secondary | ICD-10-CM

## 2020-03-24 DIAGNOSIS — F29 Unspecified psychosis not due to a substance or known physiological condition: Secondary | ICD-10-CM | POA: Diagnosis not present

## 2020-03-24 DIAGNOSIS — E876 Hypokalemia: Secondary | ICD-10-CM | POA: Diagnosis present

## 2020-03-24 LAB — BASIC METABOLIC PANEL
Anion gap: 9 (ref 5–15)
BUN: 14 mg/dL (ref 6–20)
CO2: 24 mmol/L (ref 22–32)
Calcium: 8.8 mg/dL — ABNORMAL LOW (ref 8.9–10.3)
Chloride: 110 mmol/L (ref 98–111)
Creatinine, Ser: 1.05 mg/dL (ref 0.61–1.24)
GFR calc Af Amer: >60 mL/min (ref >60)
GFR calc non Af Amer: >60 mL/min (ref >60)
Glucose, Bld: 101 mg/dL — ABNORMAL HIGH (ref 70–99)
Potassium: 3.6 mmol/L (ref 3.5–5.1)
Sodium: 143 mmol/L (ref 135–145)

## 2020-03-24 LAB — CBC
HCT: 37.9 % — ABNORMAL LOW (ref 39.0–52.0)
Hemoglobin: 13.4 g/dL (ref 13.0–17.0)
MCH: 30 pg (ref 26.0–34.0)
MCHC: 35.4 g/dL (ref 30.0–36.0)
MCV: 85 fL (ref 80.0–100.0)
Platelets: 281 10*3/uL (ref 150–400)
RBC: 4.46 MIL/uL (ref 4.22–5.81)
RDW: 14.1 % (ref 11.5–15.5)
WBC: 7.1 10*3/uL (ref 4.0–10.5)
nRBC: 0 % (ref 0.0–0.2)

## 2020-03-24 MED ORDER — ACETAMINOPHEN 650 MG RE SUPP: 650.0000 mg | Freq: Four times a day (QID) | RECTAL | Status: DC | PRN

## 2020-03-24 MED ORDER — LEVETIRACETAM IN NACL 1000 MG/100ML IV SOLN
1000.0000 mg | Freq: Once | INTRAVENOUS | Status: AC
  Administered 2020-03-24: 1000 mg via INTRAVENOUS
  Filled 2020-03-24: qty 100

## 2020-03-24 MED ORDER — HYDRALAZINE HCL 50 MG PO TABS: 50.0000 mg | ORAL_TABLET | Freq: Four times a day (QID) | ORAL | Status: DC | PRN

## 2020-03-24 MED ORDER — SODIUM CHLORIDE 0.9 % IV SOLN: INTRAVENOUS | Status: DC

## 2020-03-24 MED ORDER — MAGNESIUM HYDROXIDE 400 MG/5ML PO SUSP
30.0000 mL | Freq: Every day | ORAL | Status: DC | PRN
  Filled 2020-03-24: qty 30

## 2020-03-24 MED ORDER — DIVALPROEX SODIUM ER 250 MG PO TB24: 500.0000 mg | ORAL_TABLET | Freq: Every day | ORAL | Status: DC

## 2020-03-24 MED ORDER — DIVALPROEX SODIUM 500 MG PO DR TAB
500.0000 mg | DELAYED_RELEASE_TABLET | Freq: Once | ORAL | Status: AC
  Administered 2020-03-24: 500 mg via ORAL
  Filled 2020-03-24: qty 1

## 2020-03-24 MED ORDER — ACETAMINOPHEN 325 MG PO TABS
650.0000 mg | ORAL_TABLET | Freq: Four times a day (QID) | ORAL | Status: DC | PRN
  Administered 2020-03-30: 650 mg via ORAL
  Filled 2020-03-24: qty 2

## 2020-03-24 MED ORDER — LORAZEPAM 2 MG/ML IJ SOLN
1.0000 mg | Freq: Once | INTRAMUSCULAR | Status: AC
  Administered 2020-03-24: 1 mg via INTRAVENOUS

## 2020-03-24 MED ORDER — ENOXAPARIN SODIUM 40 MG/0.4ML ~~LOC~~ SOLN
40.0000 mg | SUBCUTANEOUS | Status: DC
  Administered 2020-03-25 – 2020-04-07 (×13): 40 mg via SUBCUTANEOUS
  Filled 2020-03-24 (×14): qty 0.4

## 2020-03-24 MED ORDER — ONDANSETRON HCL 4 MG PO TABS: 4.0000 mg | ORAL_TABLET | Freq: Four times a day (QID) | ORAL | Status: DC | PRN

## 2020-03-24 MED ORDER — LAMOTRIGINE 100 MG PO TABS: 200.0000 mg | ORAL_TABLET | Freq: Two times a day (BID) | ORAL | Status: DC

## 2020-03-24 MED ORDER — TRAZODONE HCL 50 MG PO TABS
25.0000 mg | ORAL_TABLET | Freq: Every evening | ORAL | Status: DC | PRN
  Administered 2020-03-27: 25 mg via ORAL
  Filled 2020-03-24: qty 1

## 2020-03-24 MED ORDER — DIVALPROEX SODIUM ER 250 MG PO TB24
500.0000 mg | ORAL_TABLET | Freq: Every day | ORAL | Status: DC
  Filled 2020-03-24: qty 2

## 2020-03-24 MED ORDER — DIVALPROEX SODIUM ER 500 MG PO TB24: 500.0000 mg | ORAL_TABLET | Freq: Every day | ORAL | 1 refills | Status: DC

## 2020-03-24 MED ORDER — ONDANSETRON HCL 4 MG/2ML IJ SOLN: 4.0000 mg | Freq: Four times a day (QID) | INTRAMUSCULAR | Status: DC | PRN

## 2020-03-24 NOTE — ED Notes (Signed)
Provide Ephriam Jenkins- pt;s sister, with updates  [phone]

## 2020-03-24 NOTE — ED Triage Notes (Addendum)
Pt to ED after having witnessed seizure by sister in car. Sister states she came out of [place] [place] and saw pt was seizing in car. Unknown how long pt was seizing for. Pt suctioned on arrival to room 5 and administered. RN administered 1mg  ativan.

## 2020-03-24 NOTE — ED Notes (Signed)
Pt covid swabbed at this time

## 2020-03-24 NOTE — ED Triage Notes (Signed)
Pt to ed via ems c/o seizure at the [facility] [employer]. PT has hx of seizures but has not been able to get his medications for about 6 months. PT has scrapes to right hand and knot to forehead. PT is alert and oriented.

## 2020-03-24 NOTE — ED Triage Notes (Signed)
First nurse note- had seizure, unsure how long. Hx seizure. Has been taking meds.  Arrived EMS, HR 122, sat 95% RA, 150/69.  CBG 113.  Alert and oriented with EMS. PIV with EMS

## 2020-03-24 NOTE — ED Notes (Signed)
Covid swab done and walked to lab.  

## 2020-03-24 NOTE — ED Provider Notes (Signed)
[facility] Emergency Department Provider Note  ____________________________________________   First MD Initiated Contact with Patient 03/24/20 2052     (approximate)  I have reviewed the triage vital signs and the nursing notes.   HISTORY  Chief Complaint Seizures    HPI Lance Ray is a 49 y.o. male with history of seizure disorder presents to the emergency department secondary to seizure activity which occurred today while the patient was at [facility].  Patient states that he has been noncompliant with his anti-epileptic medication (Depakote)        Past Medical History:  Diagnosis Date  . Seizures (HCC)     There are no problems to display for this patient.   Past Surgical History:  Procedure Laterality Date  . SKIN GRAFT      Prior to Admission medications   Medication Sig Start Date End Date Taking? Authorizing Provider  divalproex (DEPAKOTE ER) 500 MG 24 hr tablet Take 1 tablet (500 mg total) by mouth daily. 01/16/20 03/16/20  Willy Eddy, MD  lamoTRIgine (LAMICTAL) 200 MG tablet Take 1 tablet (200 mg total) by mouth 2 (two) times daily. 01/16/20 04/15/20  Willy Eddy, MD    Allergies Patient has no known allergies.  Family History  Problem Relation Age of Onset  . Seizures Mother     Social History Social History   Tobacco Use  . Smoking status: Current Some Day Smoker  . Smokeless tobacco: Current User  Vaping Use  . Vaping Use: Never used  Substance Use Topics  . Alcohol use: Yes    Alcohol/week: 2.0 standard drinks    Types: 2 Shots of liquor per week    Comment: "rarely"   . Drug use: No    Review of Systems Constitutional: No fever/chills Eyes: No visual changes. ENT: No sore throat. Cardiovascular: Denies chest pain. Respiratory: Denies shortness of breath. Gastrointestinal: No abdominal pain.  No nausea, no vomiting.  No diarrhea.  No constipation. Genitourinary: Negative for  dysuria. Musculoskeletal: Negative for neck pain.  Negative for back pain. Integumentary: Negative for rash. Neurological: Negative for headaches, focal weakness or numbness.   ____________________________________________   PHYSICAL EXAM:  VITAL SIGNS: ED Triage Vitals [03/24/20 1659]  Enc Vitals Group     BP 128/89     Pulse Rate 88     Resp 18     Temp 98.3 F (36.8 C)     Temp Source Oral     SpO2 100 %     Weight 81.6 kg (180 lb)     Height 1.753 m (5\' 9" )     Head Circumference      Peak Flow      Pain Score 3     Pain Loc      Pain Edu?      Excl. in GC?     Constitutional: Alert and oriented.  Eyes: Conjunctivae are normal.  Head: Left temporal swelling with overlying abrasion Ears:  Healthy appearing ear canals and TMs bilaterally Nose: No congestion/rhinnorhea. Mouth/Throat: Patient is wearing a mask. Neck: No stridor.  No meningeal signs.   Cardiovascular: Normal rate, regular rhythm. Good peripheral circulation. Grossly normal heart sounds. Respiratory: Normal respiratory effort.  No retractions. Gastrointestinal: Soft and nontender. No distention.  Musculoskeletal: No lower extremity tenderness nor edema. No gross deformities of extremities. Neurologic:  Normal speech and language. No gross focal neurologic deficits are appreciated.  Skin:  Skin is warm, dry and intact. Psychiatric: Mood and affect  are normal. Speech and behavior are normal.  ____________________________________________   LABS (all labs ordered are listed, but only abnormal results are displayed)  Labs Reviewed  BASIC METABOLIC PANEL - Abnormal; Notable for the following components:      Result Value   Glucose, Bld 101 (*)    Calcium 8.8 (*)    All other components within normal limits  CBC - Abnormal; Notable for the following components:   HCT 37.9 (*)    All other components within normal limits  CBG MONITORING, ED     RADIOLOGY I, [place] Hildagard Sobecki, personally viewed and  evaluated these images (plain radiographs) as part of my medical decision making, as well as reviewing the written report by the radiologist.  ED MD interpretation: No acute intracranial abnormality no skull fracture per radiologist.  Official radiology report(s): CT Head Wo Contrast  Result Date: 03/24/2020 CLINICAL DATA:  Head trauma, intracranial arterial injury suspected Head trauma, intracranial venous injury suspected Seizure at [facility] [employer]. EXAM: CT HEAD WITHOUT CONTRAST TECHNIQUE: Contiguous axial images were obtained from the base of the skull through the vertex without intravenous contrast. COMPARISON:  Head CT 12/29/2019 FINDINGS: Brain: Mild atrophy for age, stable. No intracranial hemorrhage, mass effect, or midline shift. No hydrocephalus. The basilar cisterns are patent. Remote lacunar infarcts versus dilated perivascular space in the basal ganglia, unchanged. No evidence of territorial infarct or acute ischemia. No extra-axial or intracranial fluid collection. Vascular: No hyperdense vessel. Skull: No fracture or focal lesion. Sinuses/Orbits: Paranasal sinuses and mastoid air cells are clear. The visualized orbits are unremarkable. Other: None. IMPRESSION: No acute intracranial abnormality. No skull fracture. Electronically Signed   By: Narda Rutherford M.D.   On: 03/24/2020 21:23    Procedures   ____________________________________________   INITIAL IMPRESSION / MDM / ASSESSMENT AND PLAN / ED COURSE  As part of my medical decision making, I reviewed the following data within the electronic MEDICAL RECORD NUMBER 49 year old male presented with above-stated history and physical exam secondary to seizure.  Patient in the emergency department for approximately 5 hours with no further seizure activity.  Patient was given Depakote 500 mg.  Patient has been noncompliant secondary to the inability to purchase his medication and as such he was given medication assistance information here as  well as good Rx coupon.  ____________________________________________  FINAL CLINICAL IMPRESSION(S) / ED DIAGNOSES  Final diagnoses:  Seizure disorder (HCC)     MEDICATIONS GIVEN DURING THIS VISIT:  Medications  divalproex (DEPAKOTE) DR tablet 500 mg (500 mg Oral Given 03/24/20 2126)     ED Discharge Orders    None      *Please note:  CLIFTON SAFLEY was evaluated in Emergency Department on 03/24/2020 for the symptoms described in the history of present illness. He was evaluated in the context of the global COVID-19 pandemic, which necessitated consideration that the patient might be at risk for infection with the SARS-CoV-2 virus that causes COVID-19. Institutional protocols and algorithms that pertain to the evaluation of patients at risk for COVID-19 are in a state of rapid change based on information released by regulatory bodies including the CDC and federal and state organizations. These policies and algorithms were followed during the patient's care in the ED.  Some ED evaluations and interventions may be delayed as a result of limited staffing during and after the pandemic.*  Note:  This document was prepared using Dragon voice recognition software and may include unintentional dictation errors.   Darci Current,  MD 03/24/20 2140

## 2020-03-24 NOTE — H&P (Addendum)
[place] at [facility]   PATIENT NAME: Lance Ray    MR#:  [id number]  DATE OF BIRTH:  01-09-1971  DATE OF ADMISSION:  03/24/2020  PRIMARY CARE PHYSICIAN: Patient, No Pcp Per   REQUESTING/REFERRING PHYSICIAN: Bayard Males, MD  CHIEF COMPLAINT:   Chief Complaint  Patient presents with  . Seizures    HISTORY OF PRESENT ILLNESS:  Lance Ray  is a 49 y.o. Caucasian male with a known history of seizure disorder, who has not been on his Depakote for months and presented to the emergency room with acute onset of witnessed generalized tonic-clonic seizure at [facility] [employer].  He was seen earlier in the ER, observed in the ER for 5 hours without recurrent seizure activity and given a prescription for Depakote ER and discharged in a good Rx coupon.  He had a recurrent tonic-clonic seizure in the parking lot that brought him back to the ER.  He denied any tongue bites.  He was fairly somnolent during my interview after being given IV Ativan and Keppra but was arousable.  He denied any headache or dizziness or blurred vision.  No paresthesias or focal muscle weakness.  He stated that he has not taken his Depakote for 6 months and Lamictal for 2 weeks.  No chest pain or dyspnea or cough or wheezing.  No nausea or vomiting or abdominal pain.  No fever or chills.  Upon presentation to the emergency room, blood pressure was 141/88 with otherwise normal vital signs.  Earlier it was 128/89.  Labs reviewed potassium of 3.6 with otherwise normal BMP and CBC was within normal.  Noncontrasted head CT scan revealed no acute intracranial abnormalities.EKG showed sinus tachycardia with a rate of 115 with probable left atrial enlargement.  The patient was given 1 mg of IV lorazepam and was loaded with 1 g of IV Keppra.  He will be admitted to an observation PCU bed for further evaluation and management. PAST MEDICAL HISTORY:   Past Medical History:  Diagnosis Date  . Seizures (HCC)      PAST SURGICAL HISTORY:   Past Surgical History:  Procedure Laterality Date  . SKIN GRAFT      SOCIAL HISTORY:   Social History   Tobacco Use  . Smoking status: Current Some Day Smoker  . Smokeless tobacco: Current User  Substance Use Topics  . Alcohol use: Yes    Alcohol/week: 2.0 standard drinks    Types: 2 Shots of liquor per week    Comment: "rarely"     FAMILY HISTORY:   Family History  Problem Relation Age of Onset  . Seizures Mother     DRUG ALLERGIES:  No Known Allergies  REVIEW OF SYSTEMS:   ROS As per history of present illness. All pertinent systems were reviewed above. Constitutional,  HEENT, cardiovascular, respiratory, GI, GU, musculoskeletal, neuro, psychiatric, endocrine,  integumentary and hematologic systems were reviewed and are otherwise  negative/unremarkable except for positive findings mentioned above in the HPI.   MEDICATIONS AT HOME:   Prior to Admission medications   Medication Sig Start Date End Date Taking? Authorizing Provider  divalproex (DEPAKOTE ER) 500 MG 24 hr tablet Take 1 tablet (500 mg total) by mouth daily. 03/24/20 05/23/20  Darci Current, MD  lamoTRIgine (LAMICTAL) 200 MG tablet Take 1 tablet (200 mg total) by mouth 2 (two) times daily. 01/16/20 04/15/20  Willy Eddy, MD      VITAL SIGNS:  Blood pressure (!) 156/91, pulse [name])  118, temperature 98.3 F (36.8 C), temperature source Oral, resp. rate (!) 26, height 5\' 9"  (1.753 m), weight 81.6 kg, SpO2 99 %.  PHYSICAL EXAMINATION:  Physical Exam  GENERAL:  49 y.o.-year-old Caucasian male patient lying in the bed with no acute distress.  EYES: Pupils equal, round, reactive to light and accommodation. No scleral icterus. Extraocular muscles intact.  HEENT: Head normocephalic with left temporoparietal contusion and abrasion. Oropharynx and nasopharynx clear.  NECK:  Supple, no jugular venous distention. No thyroid enlargement, no tenderness.  LUNGS: Normal breath  sounds bilaterally, no wheezing, rales,rhonchi or crepitation. No use of accessory muscles of respiration.  CARDIOVASCULAR: Regular rate and rhythm, S1, S2 normal. No murmurs, rubs, or gallops.  ABDOMEN: Soft, nondistended, nontender. Bowel sounds present. No organomegaly or mass.  EXTREMITIES: No pedal edema, cyanosis, or clubbing.  NEUROLOGIC: Cranial nerves II through XII are intact. Muscle strength 5/5 in all extremities. Sensation intact. Gait not checked.  PSYCHIATRIC: The patient is somnolent but arousable and alert and oriented x 3.  Normal affect and good eye contact. SKIN: No obvious rash, lesion, or ulcer.   LABORATORY PANEL:   CBC Recent Labs  Lab 03/24/20 1703  WBC 7.1  HGB 13.4  HCT 37.9*  PLT 281   ------------------------------------------------------------------------------------------------------------------  Chemistries  Recent Labs  Lab 03/24/20 1703  NA 143  K 3.6  CL 110  CO2 24  GLUCOSE 101*  BUN 14  CREATININE 1.05  CALCIUM 8.8*   ------------------------------------------------------------------------------------------------------------------  Cardiac Enzymes No results for input(s): TROPONINI in the last 168 hours. ------------------------------------------------------------------------------------------------------------------  RADIOLOGY:  CT Head Wo Contrast  Result Date: 03/24/2020 CLINICAL DATA:  Head trauma, intracranial arterial injury suspected Head trauma, intracranial venous injury suspected Seizure at [employer]. EXAM: CT HEAD WITHOUT CONTRAST TECHNIQUE: Contiguous axial images were obtained from the base of the skull through the vertex without intravenous contrast. COMPARISON:  Head CT 12/29/2019 FINDINGS: Brain: Mild atrophy for age, stable. No intracranial hemorrhage, mass effect, or midline shift. No hydrocephalus. The basilar cisterns are patent. Remote lacunar infarcts versus dilated perivascular space in the basal ganglia,  unchanged. No evidence of territorial infarct or acute ischemia. No extra-axial or intracranial fluid collection. Vascular: No hyperdense vessel. Skull: No fracture or focal lesion. Sinuses/Orbits: Paranasal sinuses and mastoid air cells are clear. The visualized orbits are unremarkable. Other: None. IMPRESSION: No acute intracranial abnormality. No skull fracture. Electronically Signed   By: 02/28/2020 M.D.   On: 03/24/2020 21:23      IMPRESSION AND PLAN:   1.  Breakthrough seizures likely secondary to noncompliance with subsequent fall and left temporoparietal contusion and abrasions. -The patient will be resumed on his home medications including Depakote and Lamictal. -We will continue IV Keppra for now pending neurology consult. -I notified Dr. 03/26/2020 about the patient. -Neurochecks will be followed every 4 hours. -As needed IV Ativan will be provided.  2.  Elevated blood pressure. -This likely secondary to his seizures. -His blood pressure will be monitored while he is here.  3.  DVT prophylaxis. -Subcutaneous Lovenox.    All the records are reviewed and case discussed with ED provider. The plan of care was discussed in details with the patient (and family). I answered all questions. The patient agreed to proceed with the above mentioned plan. Further management will depend upon hospital course.   CODE STATUS: Full code  Status is: Observation  The patient remains OBS appropriate and will d/c before 2 midnights.  Dispo: The patient is from: Home  Anticipated d/c is to: Home              Anticipated d/c date is: 1 day              Patient currently is not medically stable to d/c.    TOTAL TIME TAKING CARE OF THIS PATIENT: 50 minutes.    Hannah Beat M.D on 03/24/2020 at 11:15 PM  [facility]   From 7 PM-7 AM, contact night-coverage [email or web address]  CC: Primary care physician; Patient, No Pcp Per   Note: This dictation was prepared with  Dragon dictation along with smaller phrase technology. Any transcriptional typo errors that result from this process are unintentional.

## 2020-03-24 NOTE — ED Provider Notes (Signed)
[facility] Emergency Department Provider Note  ____________________________________________   First MD Initiated Contact with Patient 03/24/20 2217     (approximate)  I have reviewed the triage vital signs and the nursing notes.   HISTORY  Chief Complaint Seizures    HPI Lance Ray is a 49 y.o. male turned to the emergency department after being discharged secondary to having a witnessed generalized tonic-clonic seizure in the parking lot.  Patient was seen today and observed in the emergency department after a seizure which occurred at [employer].  Patient was observed for 5 hours without any further seizure activity.  Patient received Depakote and was given Depakote ER dose for tomorrow as well as description for Depakote with medication management information  and good Rx coupon.        Past Medical History:  Diagnosis Date  . Seizures (HCC)     There are no problems to display for this patient.   Past Surgical History:  Procedure Laterality Date  . SKIN GRAFT      Prior to Admission medications   Medication Sig Start Date End Date Taking? Authorizing Provider  divalproex (DEPAKOTE ER) 500 MG 24 hr tablet Take 1 tablet (500 mg total) by mouth daily. 03/24/20 05/23/20  Darci Current, MD  lamoTRIgine (LAMICTAL) 200 MG tablet Take 1 tablet (200 mg total) by mouth 2 (two) times daily. 01/16/20 04/15/20  Willy Eddy, MD    Allergies Patient has no known allergies.  Family History  Problem Relation Age of Onset  . Seizures Mother     Social History Social History   Tobacco Use  . Smoking status: Current Some Day Smoker  . Smokeless tobacco: Current User  Vaping Use  . Vaping Use: Never used  Substance Use Topics  . Alcohol use: Yes    Alcohol/week: 2.0 standard drinks    Types: 2 Shots of liquor per week    Comment: "rarely"   . Drug use: No    Review of Systems Constitutional: No fever/chills Eyes: No  visual changes. ENT: No sore throat. Cardiovascular: Denies chest pain. Respiratory: Denies shortness of breath. Gastrointestinal: No abdominal pain.  No nausea, no vomiting.  No diarrhea.  No constipation. Genitourinary: Negative for dysuria. Musculoskeletal: Negative for neck pain.  Negative for back pain. Integumentary: Negative for rash. Neurological:  Positive for generalized tonic-clonic seizure   ____________________________________________   PHYSICAL EXAM:  VITAL SIGNS: ED Triage Vitals  Enc Vitals Group     BP 03/24/20 2218 (!) 156/91     Pulse Rate 03/24/20 2218 (!) 118     Resp 03/24/20 2218 (!) 26     Temp 03/24/20 2223 98.3 F (36.8 C)     Temp Source 03/24/20 2223 Oral     SpO2 03/24/20 2218 99 %     Weight 03/24/20 2220 81.6 kg (180 lb)     Height 03/24/20 2220 1.753 m (5\' 9" )     Head Circumference --      Peak Flow --      Pain Score 03/24/20 2220 0     Pain Loc --      Pain Edu? --      Excl. in GC? --     Constitutional: Alert and oriented.  Eyes: Conjunctivae are normal.  Head: Atraumatic. Mouth/Throat: Anterior tongue laceration Neck: No stridor.  No meningeal signs.   Cardiovascular: Normal rate, regular rhythm. Good peripheral circulation. Grossly normal heart sounds. Respiratory: Normal respiratory effort.  No  retractions. Gastrointestinal: Soft and nontender. No distention.  Musculoskeletal: No lower extremity tenderness nor edema. No gross deformities of extremities. Neurologic:  Normal speech and language. No gross focal neurologic deficits are appreciated.  Skin:  Skin is warm, dry and intact. Psychiatric: Mood and affect are normal. Speech and behavior are normal.  ____________________________________________   ___________________________________  RADIOLOGY I, Darci Current, personally viewed and evaluated these images (plain radiographs) as part of my medical decision making, as well as reviewing the written report by the  radiologist.  ED MD interpretation:    Official radiology report(s): CT Head Wo Contrast  Result Date: 03/24/2020 CLINICAL DATA:  Head trauma, intracranial arterial injury suspected Head trauma, intracranial venous injury suspected Seizure at [employer]. EXAM: CT HEAD WITHOUT CONTRAST TECHNIQUE: Contiguous axial images were obtained from the base of the skull through the vertex without intravenous contrast. COMPARISON:  Head CT 12/29/2019 FINDINGS: Brain: Mild atrophy for age, stable. No intracranial hemorrhage, mass effect, or midline shift. No hydrocephalus. The basilar cisterns are patent. Remote lacunar infarcts versus dilated perivascular space in the basal ganglia, unchanged. No evidence of territorial infarct or acute ischemia. No extra-axial or intracranial fluid collection. Vascular: No hyperdense vessel. Skull: No fracture or focal lesion. Sinuses/Orbits: Paranasal sinuses and mastoid air cells are clear. The visualized orbits are unremarkable. Other: None. IMPRESSION: No acute intracranial abnormality. No skull fracture. Electronically Signed   By: Narda Rutherford M.D.   On: 03/24/2020 21:23    ____________________________________________   PROCEDURES   Procedure(s) performed (including Critical Care):  Procedures   ____________________________________________   INITIAL IMPRESSION / MDM / ASSESSMENT AND PLAN / ED COURSE  As part of my medical decision making, I reviewed the following data within the electronic MEDICAL RECORD NUMBER   49 year old male presenting secondary to generalized tonic-clonic seizure.  Patient given 1 mg of Ativan with no further seizure activity.  Given repeat seizure activity despite interventions before discharge.  Will give Keppra 1 g now.  Patient will be admitted to the hospital for further evaluation and management.  ____________________________________________  FINAL CLINICAL IMPRESSION(S) / ED DIAGNOSES  Final diagnoses:  Seizure (HCC)      MEDICATIONS GIVEN DURING THIS VISIT:  Medications  LORazepam (ATIVAN) injection 1 mg (1 mg Intravenous Given 03/24/20 2225)     ED Discharge Orders    None      *Please note:  Lance Ray was evaluated in Emergency Department on 03/24/2020 for the symptoms described in the history of present illness. He was evaluated in the context of the global COVID-19 pandemic, which necessitated consideration that the patient might be at risk for infection with the SARS-CoV-2 virus that causes COVID-19. Institutional protocols and algorithms that pertain to the evaluation of patients at risk for COVID-19 are in a state of rapid change based on information released by regulatory bodies including the CDC and federal and state organizations. These policies and algorithms were followed during the patient's care in the ED.  Some ED evaluations and interventions may be delayed as a result of limited staffing during and after the pandemic.*  Note:  This document was prepared using Dragon voice recognition software and may include unintentional dictation errors.   Darci Current, MD 03/25/20 [phone]

## 2020-03-25 DIAGNOSIS — F332 Major depressive disorder, recurrent severe without psychotic features: Secondary | ICD-10-CM

## 2020-03-25 DIAGNOSIS — G40909 Epilepsy, unspecified, not intractable, without status epilepticus: Secondary | ICD-10-CM

## 2020-03-25 LAB — CBC
HCT: 37.2 % — ABNORMAL LOW (ref 39.0–52.0)
Hemoglobin: 12.4 g/dL — ABNORMAL LOW (ref 13.0–17.0)
MCH: 29.5 pg (ref 26.0–34.0)
MCHC: 33.3 g/dL (ref 30.0–36.0)
MCV: 88.6 fL (ref 80.0–100.0)
Platelets: 263 10*3/uL (ref 150–400)
RBC: 4.2 MIL/uL — ABNORMAL LOW (ref 4.22–5.81)
RDW: 14.2 % (ref 11.5–15.5)
WBC: 8 10*3/uL (ref 4.0–10.5)
nRBC: 0 % (ref 0.0–0.2)

## 2020-03-25 LAB — BASIC METABOLIC PANEL
Anion gap: 9 (ref 5–15)
BUN: 11 mg/dL (ref 6–20)
CO2: 24 mmol/L (ref 22–32)
Calcium: 8.3 mg/dL — ABNORMAL LOW (ref 8.9–10.3)
Chloride: 108 mmol/L (ref 98–111)
Creatinine, Ser: 0.94 mg/dL (ref 0.61–1.24)
GFR calc Af Amer: >60 mL/min (ref >60)
GFR calc non Af Amer: >60 mL/min (ref >60)
Glucose, Bld: 96 mg/dL (ref 70–99)
Potassium: 3.5 mmol/L (ref 3.5–5.1)
Sodium: 141 mmol/L (ref 135–145)

## 2020-03-25 LAB — SARS CORONAVIRUS 2 BY RT PCR (HOSPITAL ORDER, PERFORMED IN ~~LOC~~ HOSPITAL LAB): SARS Coronavirus 2: NEGATIVE

## 2020-03-25 LAB — HIV ANTIBODY (ROUTINE TESTING W REFLEX): HIV Screen 4th Generation wRfx: NONREACTIVE

## 2020-03-25 MED ORDER — DIVALPROEX SODIUM 500 MG PO DR TAB
500.0000 mg | DELAYED_RELEASE_TABLET | Freq: Three times a day (TID) | ORAL | Status: DC
  Administered 2020-03-25 (×2): 500 mg via ORAL
  Filled 2020-03-25 (×2): qty 1

## 2020-03-25 MED ORDER — LEVETIRACETAM IN NACL 1000 MG/100ML IV SOLN
1000.0000 mg | Freq: Two times a day (BID) | INTRAVENOUS | Status: DC
  Administered 2020-03-25 – 2020-04-03 (×19): 1000 mg via INTRAVENOUS
  Filled 2020-03-25 (×21): qty 100

## 2020-03-25 MED ORDER — LEVETIRACETAM IN NACL 500 MG/100ML IV SOLN
500.0000 mg | Freq: Two times a day (BID) | INTRAVENOUS | Status: DC
  Administered 2020-03-25: 500 mg via INTRAVENOUS
  Filled 2020-03-25 (×2): qty 100

## 2020-03-25 MED ORDER — LORAZEPAM 2 MG/ML IJ SOLN
1.0000 mg | INTRAMUSCULAR | Status: DC | PRN
  Administered 2020-03-27 – 2020-03-31 (×3): 1 mg via INTRAVENOUS
  Filled 2020-03-25 (×3): qty 1

## 2020-03-25 MED ORDER — LAMOTRIGINE 100 MG PO TABS
150.0000 mg | ORAL_TABLET | Freq: Two times a day (BID) | ORAL | Status: DC
  Administered 2020-03-25 – 2020-04-09 (×28): 150 mg via ORAL
  Filled 2020-03-25 (×3): qty 2
  Filled 2020-03-25: qty 6
  Filled 2020-03-25: qty 2
  Filled 2020-03-25: qty 6
  Filled 2020-03-25: qty 2
  Filled 2020-03-25: qty 6
  Filled 2020-03-25 (×3): qty 2
  Filled 2020-03-25: qty 6
  Filled 2020-03-25 (×4): qty 2
  Filled 2020-03-25: qty 1
  Filled 2020-03-25 (×4): qty 2
  Filled 2020-03-25: qty 1.5
  Filled 2020-03-25 (×5): qty 2
  Filled 2020-03-25: qty 6

## 2020-03-25 MED ORDER — LAMOTRIGINE 25 MG PO TABS
25.0000 mg | ORAL_TABLET | ORAL | Status: DC
  Administered 2020-03-25: 25 mg via ORAL
  Filled 2020-03-25: qty 1

## 2020-03-25 NOTE — ED Notes (Signed)
RN woke pt for neuro check. Pt denies pain, and states coming in for seizure and that he has a history of seizures.

## 2020-03-25 NOTE — Progress Notes (Signed)
49 y/o patient admitted x seizures w/ h/o seizure d/o and has been off of his anti-epileptic meds for a month now. Patient is suppose to take depakote 500 mg ER daily and lamictal 200 mg bid.   Since patient has gone a month without his medications will restart lamictal at 25 mg every other day to avoid SJS since patient also on depakote. Will also restart patient's depakote; however, will dose as depakote DR 500 mg q8h and will check a free-valproate level 07/02 at 0500 prior to 5th dose to ensure steady-state.  Goal free-valproate level: 5 - 15 mcg/mL Goal total (if total is ordered) valproate level: 50 - 100 mcg/mL  Will continue to monitor and adjust as needed per levels.  Thomasene Ripple, PharmD, BCPS Clinical Pharmacist 03/25/2020

## 2020-03-25 NOTE — ED Notes (Signed)
Pt sitting up eating at this time.

## 2020-03-25 NOTE — ED Notes (Signed)
Pt resting in bed with no complaints at this time. Oriented to room and call bell, call light in reach.

## 2020-03-25 NOTE — Consult Note (Signed)
Reason for Consult:Seizures Requesting Physician: Swayze  CC: Seizure  I have been asked by Dr. Gerri Lins to see this patient in consultation for recurrent seizures.  HPI: Lance Ray is an 49 y.o. male who is lethargic and unable to provide any history therefore all history obtained from the chart.  Patient with a known history of seizure disorder and medication noncompliance, who has not been on his Depakote and Lamictal for months and presented to the emergency room with acute onset of witnessed generalized tonic-clonic seizure at [facility].  He was seen earlier in the ER, observed in the ER for 5 hours without recurrent seizure activity and given a prescription for Depakote ER and discharged.  He had a recurrent tonic-clonic seizure in the parking lot that brought him back to the ER.   Patient is now lethargic.  Conversation had with sister who reports that patient did very well with Keppra in the past but just was not compliant.  Patient loaded with Keppra in ED.    Past Medical History:  Diagnosis Date  . Seizures (HCC)     Past Surgical History:  Procedure Laterality Date  . SKIN GRAFT      Family History  Problem Relation Age of Onset  . Seizures Mother     Social History:  reports that he has been smoking. He uses smokeless tobacco. He reports current alcohol use of about 2.0 standard drinks of alcohol per week. He reports that he does not use drugs.  No Known Allergies  Medications: I have reviewed the patient's current medications. Prior to Admission medications   Medication Sig Start Date End Date Taking? Authorizing Provider  divalproex (DEPAKOTE ER) 500 MG 24 hr tablet Take 1 tablet (500 mg total) by mouth daily. Patient not taking: Reported on 03/25/2020 03/24/20 05/23/20  Darci Current, MD  lamoTRIgine (LAMICTAL) 200 MG tablet Take 1 tablet (200 mg total) by mouth 2 (two) times daily. Patient not taking: Reported on 03/25/2020 01/16/20 04/15/20  Willy Eddy, MD    ROS: Unable to obtain due to mental status  Physical Examination: Blood pressure 126/89, pulse 68, temperature 98.3 F (36.8 C), temperature source Oral, resp. rate 16, height 5\' 9"  (1.753 m), weight 81.6 kg, SpO2 100 %.  HEENT-  Scalp abrasion.  Normal external eye and conjunctiva.  Normal TM's bilaterally.  Normal auditory canals and external ears. Normal external nose, mucus membranes and septum.  Normal pharynx. Cardiovascular- S1, S2 normal, pulses palpable throughout   Lungs- chest clear, no wheezing, rales, normal symmetric air entry Abdomen- soft, non-tender; bowel sounds normal; no masses,  no organomegaly Extremities- no edema Lymph-no adenopathy palpable Musculoskeletal-no joint tenderness, deformity or swelling Skin-warm and dry, no hyperpigmentation, vitiligo, or suspicious lesions  Neurological Examination   Mental Status: Lethargic.  Minimal speech.  Follows simple commands.   Cranial Nerves: II: Blinks to bilateral confrontation.  Pupils equal, round, reactive to light and accommodation III,IV, VI: ptosis not present, extra-ocular motions intact bilaterally V,VII: smile symmetric VIII: hearing normal bilaterally IX,X: gag reflex present XI: bilateral shoulder shrug XII: midline tongue extension Motor: Right : Upper extremity   5/5    Left:     Upper extremity   5/5  Lower extremity   5/5     Lower extremity   5/5 Tone and bulk:normal tone throughout; no atrophy noted Sensory: Pinprick and light touch intact throughout, bilaterally Deep Tendon Reflexes: Symmetric throughout Plantars: Right: mute   Left: mute Cerebellar: Not tested due  to lethargy Gait: Not tested due to lethargy   Laboratory Studies:   Basic Metabolic Panel: Recent Labs  Lab 03/24/20 1703 03/25/20 0419  NA 143 141  K 3.6 3.5  CL 110 108  CO2 24 24  GLUCOSE 101* 96  BUN 14 11  CREATININE 1.05 0.94  CALCIUM 8.8* 8.3*    Liver Function Tests: No results for  input(s): AST, ALT, ALKPHOS, BILITOT, PROT, ALBUMIN in the last 168 hours. No results for input(s): LIPASE, AMYLASE in the last 168 hours. No results for input(s): AMMONIA in the last 168 hours.  CBC: Recent Labs  Lab 03/24/20 1703 03/25/20 0419  WBC 7.1 8.0  HGB 13.4 12.4*  HCT 37.9* 37.2*  MCV 85.0 88.6  PLT 281 263    Cardiac Enzymes: No results for input(s): CKTOTAL, CKMB, CKMBINDEX, TROPONINI in the last 168 hours.  BNP: Invalid input(s): POCBNP  CBG: No results for input(s): GLUCAP in the last 168 hours.  Microbiology: Results for orders placed or performed during the hospital encounter of 10/27/15  Urine culture     Status: None   Collection Time: 10/28/15 12:13 AM   Specimen: Urine, Clean Catch  Result Value Ref Range Status   Specimen Description URINE, CLEAN CATCH  Final   Special Requests Normal  Final   Culture NO GROWTH 2 DAYS  Final   Report Status 10/30/2015 FINAL  Final    Coagulation Studies: No results for input(s): LABPROT, INR in the last 72 hours.  Urinalysis: No results for input(s): COLORURINE, LABSPEC, PHURINE, GLUCOSEU, HGBUR, BILIRUBINUR, KETONESUR, PROTEINUR, UROBILINOGEN, NITRITE, LEUKOCYTESUR in the last 168 hours.  Invalid input(s): APPERANCEUR  Lipid Panel:  No results found for: CHOL, TRIG, HDL, CHOLHDL, VLDL, LDLCALC  HgbA1C: No results found for: HGBA1C  Urine Drug Screen:      Component Value Date/Time   LABOPIA NONE DETECTED 05/27/2016 0200   COCAINSCRNUR NONE DETECTED 05/27/2016 0200   LABBENZ NONE DETECTED 05/27/2016 0200   AMPHETMU NONE DETECTED 05/27/2016 0200   THCU NONE DETECTED 05/27/2016 0200   LABBARB NONE DETECTED 05/27/2016 0200    Alcohol Level: No results for input(s): ETH in the last 168 hours.  Other results: EKG: sinus tachycardia at 115 bpm.  Imaging: CT Head Wo Contrast  Result Date: 03/24/2020 CLINICAL DATA:  Head trauma, intracranial arterial injury suspected Head trauma, intracranial venous  injury suspected Seizure at [employer]. EXAM: CT HEAD WITHOUT CONTRAST TECHNIQUE: Contiguous axial images were obtained from the base of the skull through the vertex without intravenous contrast. COMPARISON:  Head CT 12/29/2019 FINDINGS: Brain: Mild atrophy for age, stable. No intracranial hemorrhage, mass effect, or midline shift. No hydrocephalus. The basilar cisterns are patent. Remote lacunar infarcts versus dilated perivascular space in the basal ganglia, unchanged. No evidence of territorial infarct or acute ischemia. No extra-axial or intracranial fluid collection. Vascular: No hyperdense vessel. Skull: No fracture or focal lesion. Sinuses/Orbits: Paranasal sinuses and mastoid air cells are clear. The visualized orbits are unremarkable. Other: None. IMPRESSION: No acute intracranial abnormality. No skull fracture. Electronically Signed   By: Narda Rutherford M.D.   On: 03/24/2020 21:23     Assessment/Plan: 49 y.o. male who is lethargic and unable to provide any history therefore all history obtained from the chart.  Patient with a known history of seizure disorder and medication noncompliance, who has not been on his Depakote and Lamictal for months and presented to the emergency room with acute onset of witnessed generalized tonic-clonic seizure at [employer].  He was seen earlier in the ER, observed in the ER for 5 hours without recurrent seizure activity and given a prescription for Depakote ER and discharged.  He had a recurrent tonic-clonic seizure in the parking lot that brought him back to the ER.   Patient is now lethargic.  On review of his old records he has not seen Neurology since 2018.  At that time was on 2 antiepileptics.  Was to be on Keppra and Lamictal but had been changed to Depakote and Lamictal for unknown reasons.  Was continued on Depakote and Lamictal.  Sister reports that she feels he did best on Keppra.    Recommendations: 1. Patient loaded with Keppra.  Would  continue at 1000mg  BID 2. Start Lamictal at 150mg  BID with plans to increase to 300mg  BID if patient remains compliant.   3. Seizure precautions 4. Patient unable to drive, operate heavy machinery, perform activities at heights and participate in water activities until release by outpatient physician.  , MD Neurology [phone] 03/25/2020, 12:26 PM

## 2020-03-25 NOTE — ED Notes (Signed)
Pt sleeping, NAD.  Unlabored and even respirations.

## 2020-03-25 NOTE — Progress Notes (Signed)
PROGRESS NOTE  LATRON RIBAS JQZ:[id number] DOB: [birth year] DOA: 03/24/2020 PCP: Patient, No Pcp Per  Brief History   Lance Ray  is a 49 y.o. Caucasian male with a known history of seizure disorder, who has not been on his Depakote for months and presented to the emergency room with acute onset of witnessed generalized tonic-clonic seizure at [employer].  He was seen earlier in the ER, observed in the ER for 5 hours without recurrent seizure activity and given a prescription for Depakote ER and discharged in a good Rx coupon.  He had a recurrent tonic-clonic seizure in the parking lot that brought him back to the ER.  He denied any tongue bites.  He was fairly somnolent during my interview after being given IV Ativan and Keppra but was arousable.  He denied any headache or dizziness or blurred vision.  No paresthesias or focal muscle weakness.  He stated that he has not taken his Depakote for 6 months and Lamictal for 2 weeks.  No chest pain or dyspnea or cough or wheezing.  No nausea or vomiting or abdominal pain.  No fever or chills.  Upon presentation to the emergency room, blood pressure was 141/88 with otherwise normal vital signs.  Earlier it was 128/89.  Labs reviewed potassium of 3.6 with otherwise normal BMP and CBC was within normal.  Noncontrasted head CT scan revealed no acute intracranial abnormalities.EKG showed sinus tachycardia with a rate of 115 with probable left atrial enlargement.  The patient was given 1 mg of IV lorazepam and was loaded with 1 g of IV Keppra.  He will be admitted to an observation PCU bed for further evaluation and management.  [facility] were consulted to admit the patient for further evaluation and treatment. The patient had been taking depakote for many years according to his sister. She stated that she did not think that it worked well. She understood that when the patient presented to [facility] with a seizure a while ago that was why they had  loaded the patient with keppra and started him on that. According to the sister the pattern is that the patient will be seen at a hospital ED after a seizure. He will be set up with one month of meds, and then he will stop taking his meds when he runs out. They have looked into getting medicaid for him, but the patient is not cooperative with the process.  Consultants  . Neurology  Procedures  . None  Antibiotics   Anti-infectives (From admission, onward)   None    .  Subjective  The patient is lethargic, but is able to follow commands. Reactions are slowed. No acute distress.  Objective   Vitals:  Vitals:   03/25/20 1600 03/25/20 1642  BP: 101/65 116/69  Pulse: 77 86  Resp: 14   Temp:  98 F (36.7 C)  SpO2: 99% 99%    Exam:  Constitutional:  . Appears calm and comfortable Respiratory:  . CTA bilaterally, no w/r/r.  . Respiratory effort normal. No retractions or accessory muscle use Cardiovascular:  . RRR, no m/r/g . No LE extremity edema   . Normal pedal pulses Abdomen:  . Abdomen appears normal; no tenderness or masses . No hernias . No HSM Musculoskeletal:  . Digits/nails BUE: no clubbing, cyanosis, petechiae, infection . exam of joints, bones, muscles of at least one of following: head/neck, RUE, LUE, RLE, LLE   o strength and tone normal, no atrophy, no abnormal movements o  No tenderness, masses o Normal ROM, no contractures  . gait and station Skin:  . No rashes, lesions, ulcers . palpation of skin: no induration or nodules Neurologic:  . CN 2-12 intact . Sensation all 4 extremities intact Psychiatric:  . Unable to evaluate due to the patient's inability to cooperate with exam.   I have personally reviewed the following:   Today's Data  . Vitals, BMP, CBC  Imaging  . CT head  Cardiology Data  . EKG  Scheduled Meds: . enoxaparin (LOVENOX) injection  40 mg Subcutaneous Q24H  . lamoTRIgine  150 mg Oral BID   Continuous Infusions: .  sodium chloride 100 mL/hr at 03/25/20 1648  . levETIRAcetam      Active Problems:   Recurrent seizures (HCC)   Seizure (HCC)   LOS: 0 days   A & P  Breakthrough seizures likely secondary to medical noncompliance with subsequent fall and left temporoparietal contusion and abrasions: According to the patient's sister he has no way to support himself. His family owns the house he lives in. He goes to ED's with seizures when he runs out of meds and gets another months worth of medication. Will consult TOC to see what we can do. Dr. Thad Ranger assistance is greatly appreciated. The patient will be continued on keppra as it seems that he was not doing well on the depakote anyway. The patient remains very somnolent. He needs time to become more alert.   Likely depression: Will consult psychiatry and talk to [facility] about setting him up with outpatient resources.  Hypertension: Likely due to seizures. Resolved now. Will continue to monitor.  I have seen and examined this patient myself. I have spent 38 minutes in his evaluation and care.  DVT prophylaxis. Lovenox. CODE STATUS: Full Code Family Communication: Discussed patient at length with his sister at bedside with Dr. Thad Ranger. All questions answered to the best of my ability. Disposition: The patient is from home. Anticipate discharge to home. Barriers to discharge: Resolution of lethargic/post-ictal status. Need to try to arrange some sort of outpatient support for the patient.  Status is: Inpatient  Remains inpatient appropriate because:Unsafe d/c plan   Dispo: The patient is from: Home              Anticipated d/c is to: Home              Anticipated d/c date is: 1 day              Patient currently is not medically stable to d/c. Kalah Pflum, DO [facility] Direct contact: see [email or web address]  7PM-7AM contact night coverage as above 03/25/2020, 5:24 PM  LOS: 0 days

## 2020-03-26 DIAGNOSIS — R4189 Other symptoms and signs involving cognitive functions and awareness: Secondary | ICD-10-CM

## 2020-03-26 NOTE — Progress Notes (Signed)
PROGRESS NOTE  Lance Ray YBO:[id number] DOB: [birth year] DOA: 03/24/2020 PCP: Patient, No Pcp Per  Brief History   Lance Ray  is a 49 y.o. Caucasian male with a known history of seizure disorder, who has not been on his Depakote for months and presented to the emergency room with acute onset of witnessed generalized tonic-clonic seizure at [facility].  He was seen earlier in the ER, observed in the ER for 5 hours without recurrent seizure activity and given a prescription for Depakote ER and discharged in a good Rx coupon.  He had a recurrent tonic-clonic seizure in the parking lot that brought him back to the ER.  He denied any tongue bites.  He was fairly somnolent during my interview after being given IV Ativan and Keppra but was arousable.  He denied any headache or dizziness or blurred vision.  No paresthesias or focal muscle weakness.  He stated that he has not taken his Depakote for 6 months and Lamictal for 2 weeks.  No chest pain or dyspnea or cough or wheezing.  No nausea or vomiting or abdominal pain.  No fever or chills.  Upon presentation to the emergency room, blood pressure was 141/88 with otherwise normal vital signs.  Earlier it was 128/89.  Labs reviewed potassium of 3.6 with otherwise normal BMP and CBC was within normal.  Noncontrasted head CT scan revealed no acute intracranial abnormalities.EKG showed sinus tachycardia with a rate of 115 with probable left atrial enlargement.  The patient was given 1 mg of IV lorazepam and was loaded with 1 g of IV Keppra.  He will be admitted to an observation PCU bed for further evaluation and management.  [facility] were consulted to admit the patient for further evaluation and treatment. The patient had been taking depakote for many years according to his sister. She stated that she did not think that it worked well. She understood that when the patient presented to [facility] with a seizure a while ago that was why they had  loaded the patient with keppra and started him on that. According to the sister the pattern is that the patient will be seen at a hospital ED after a seizure. He will be set up with one month of meds, and then he will stop taking his meds when he runs out. He will not act in his own self interest to maintain his health. They have looked into getting medicaid for him, but the patient is not cooperative with the process.  Psychiatry has been consulted to evaluate the patient's depression. Transitions of Care has been consulted to help set the patient up with outpatient mental health clinic and to help the patient to set up an ongoing source of antiepileptics.  Consultants  . Neurology  Procedures  . None  Antibiotics   Anti-infectives (From admission, onward)   None     Subjective  The patient is awake and alert this morning. He is complaining about IV site, and being in the hospital.  Objective   Vitals:  Vitals:   03/26/20 0758 03/26/20 1206  BP: 122/78 133/85  Pulse: 82 82  Resp: 17 17  Temp: 98.4 F (36.9 C) 98.7 F (37.1 C)  SpO2: 100% 100%    Exam:  Constitutional:  The patient is awake, alert, and oriented x 3. No acute distress. No further seizures. Respiratory:  No increased work of breathing. No wheezes, rales, or rhonchi No tactile fremitus Cardiovascular:  Regular rate and rhythm No murmurs,  ectopy, or gallups. No lateral PMI. No thrills. Abdomen:  Abdomen is soft, non-tender, non-distended No hernias, masses, or organomegaly Normoactive bowel sounds.  Musculoskeletal:  No cyanosis, clubbing, or edema Skin:  No rashes, lesions, ulcers palpation of skin: no induration or nodules Neurologic:  CN 2-12 intact Sensation all 4 extremities intact Psychiatric:  Mental status Mood, affect appropriate Orientation to person, place, time  judgment and insight appear intact .    I have personally reviewed the following:   Today's Data   . Vitals  Imaging  . CT head  Cardiology Data  . EKG  Scheduled Meds: . enoxaparin (LOVENOX) injection  40 mg Subcutaneous Q24H  . lamoTRIgine  150 mg Oral BID   Continuous Infusions: . sodium chloride 100 mL/hr at 03/25/20 1648  . levETIRAcetam 1,000 mg (03/26/20 2426)    Active Problems:   Recurrent seizures (HCC)   Seizure (HCC)   LOS: 1 day   A & P  Breakthrough seizures likely secondary to medical noncompliance with subsequent fall and left temporoparietal contusion and abrasions: According to the patient's sister he has no way to support himself. His family owns the house he lives in. He goes to ED's with seizures when he runs out of meds and gets another months worth of medication. Will consult TOC to see what we can do. Dr. Thad Ranger assistance is greatly appreciated. The patient will be continued on keppra as it seems that he was not doing well on the depakote anyway. The patient's sensorium is much improved today. TOC has been consulted to help the patient with ongoing source of his antiepileptic medications.  Likely depression: Will consult psychiatry and talk to [facility] about setting him up with outpatient resources.  Hypertension: Likely due to seizures. Resolved now. Will continue to monitor.  I have seen and examined this patient myself. I have spent 32 minutes in his evaluation and care.  DVT prophylaxis. Lovenox. CODE STATUS: Full Code Family Communication: Discussed patient at length with his sister at bedside with Dr. Thad Ranger. All questions answered to the best of my ability. Disposition: The patient is from home. Anticipate discharge to home. Barriers to discharge: Resolution of lethargic/post-ictal status. Need to try to arrange some sort of outpatient support for the patient.  Status is: Inpatient  Remains inpatient appropriate because:Unsafe d/c plan   Dispo: The patient is from: Home              Anticipated d/c is to: Home              Anticipated  d/c date is: 1 day              Patient currently is not medically stable to d/c. July Linam, DO [facility] Direct contact: see [email or web address]  7PM-7AM contact night coverage as above 03/26/2020, 12:58 PM  LOS: 0 days

## 2020-03-26 NOTE — Consult Note (Signed)
[facility] Face-to-Face Psychiatry Consult   Reason for Consult:  Psych issues affecting physical condition / possible depression Referring Physician:  IM Team  Patient Identification: Lance Ray MRN:  [id number] Principal Diagnosis: <principal problem not specified> Diagnosis:  Active Problems:   Recurrent seizures (HCC)   Seizure (HCC) Psych factors affecting physical condition  Personality Disorder NOS   Total Time spent with patient: 30-40     Subjective:   Lance Ray is a 49 y.o. male patient admitted with History of seizures, unclear compliance.  Now being treated.  History of admits due to seizures and not necessarily taking medications.  Unclear Psych Status  HPI:  As above.  Patient denies past psych history or family history.  He says he has no major psych issues but has no clear answer why he does not follow through with his seizure medications.   He has capacity for consent where he knows that seizures occur ---without treatment and that treatment does help seizures but gives no clear answer as to why he does not regularly take them  He makes resistant  answers about their cost and that he cannot afford and does not know where to go to  Public sector clinic however his traditional Depakote is generic and can be easily obtained.  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 Seizures (HCC)     Past Surgical History:  Procedure Laterality Date  . SKIN GRAFT     Family History:  Family History  Problem Relation Age of Onset  . Seizures  Mother    Family Psychiatric  History:  He says there is no family history  Social History:  Social History   Substance and Sexual Activity  Alcohol Use Yes  . Alcohol/week: 2.0 standard drinks  . Types: 2 Shots of liquor per week   Comment: "rarely"      Social History   Substance and Sexual Activity  Drug Use No    Social History   Socioeconomic History  . Marital status: Single    Spouse name: Not on file  . Number of children: Not on file  . Years of education: Not on file  . Highest education level: Not on file  Occupational History  . Not on file  Tobacco Use  . Smoking status: Current Some Day Smoker  . Smokeless tobacco: Current User  Vaping Use  . Vaping Use: Never used  Substance and Sexual Activity  . Alcohol use: Yes    Alcohol/week: 2.0 standard drinks    Types: 2 Shots of liquor per week    Comment: "rarely"   . Drug use: No  . Sexual activity: Yes  Other Topics Concern  . Not on file  Social History Narrative  . Not on file   Social Determinants of Health   Financial Resource Strain:   . Difficulty of Paying Living Expenses:   Food Insecurity:   . Worried About Programme researcher, broadcasting/film/video in the Last Year:   . Ran  Out of Food in the Last Year:   Transportation Needs:   . Lack of Transportation (Medical):   [name] Lack of Transportation (Non-Medical):   Physical Activity:   . Days of Exercise per Week:   . Minutes of Exercise per Session:   Stress:   . Feeling of Stress :   Social Connections:   . Frequency of Communication with Friends and Family:   . Frequency of Social Gatherings with Friends and Family:   . Attends Religious Services:   . Active Member of Clubs or Organizations:   . Attends Banker Meetings:   [name] Marital Status:    Additional Social History:    Allergies:  No Known Allergies  Labs:  Results for orders placed or performed during the hospital encounter of 03/24/20 (from the past 48 hour(s))  HIV Antibody  (routine testing w rflx)     Status: None   Collection Time: 03/24/20 11:13 PM  Result Value Ref Range   HIV Screen 4th Generation wRfx Non Reactive Non Reactive    Comment: Performed at [facility], [address], [place], [place] [address]  Basic metabolic panel     Status: Abnormal   Collection Time: 03/25/20  4:19 AM  Result Value Ref Range   Sodium 141 135 - 145 mmol/L   Potassium 3.5 3.5 - 5.1 mmol/L   Chloride 108 98 - 111 mmol/L   CO2 24 22 - 32 mmol/L   Glucose, Bld 96 70 - 99 mg/dL    Comment: Glucose reference range applies only to samples taken after fasting for at least 8 hours.   BUN 11 6 - 20 mg/dL   Creatinine, Ser 1.06 0.61 - 1.24 mg/dL   Calcium 8.3 (L) 8.9 - 10.3 mg/dL   GFR calc non Af Amer >60 >60 mL/min   GFR calc Af Amer >60 >60 mL/min   Anion gap 9 5 - 15    Comment: Performed at [facility], [address] [place], [place] [address]  CBC     Status: Abnormal   Collection Time: 03/25/20  4:19 AM  Result Value Ref Range   WBC 8.0 4.0 - 10.5 K/uL   RBC 4.20 (L) 4.22 - 5.81 MIL/uL   Hemoglobin 12.4 (L) 13.0 - 17.0 g/dL   HCT 54.6 (L) 39 - 52 %   MCV 88.6 80.0 - 100.0 fL   MCH 29.5 26.0 - 34.0 pg   MCHC 33.3 30.0 - 36.0 g/dL   RDW 27.0 35.0 - 09.3 %   Platelets 263 150 - 400 K/uL   nRBC 0.0 0.0 - 0.2 %    Comment: Performed at [facility], [address] [place], [place] [address]  SARS Coronavirus 2 by RT PCR (hospital order, performed in [facility] hospital lab) Nasopharyngeal Nasopharyngeal Swab     Status: None   Collection Time: 03/25/20  4:13 PM   Specimen: Nasopharyngeal Swab  Result Value Ref Range   SARS Coronavirus 2 NEGATIVE NEGATIVE    Comment: (NOTE) SARS-CoV-2 target nucleic acids are NOT DETECTED.  The SARS-CoV-2 RNA is generally detectable in upper and lower respiratory specimens during the acute phase of infection. The lowest concentration of SARS-CoV-2 viral copies this assay can detect is 250 copies  / mL. A negative result does not preclude SARS-CoV-2 infection and should not be used as the sole basis for treatment or other patient management decisions.  A negative result may occur with improper specimen collection /  handling, submission of specimen other than nasopharyngeal swab, presence of viral mutation(s) within the areas targeted by this assay, and inadequate number of viral copies (<250 copies / mL). A negative result must be combined with clinical observations, patient history, and epidemiological information.  Fact Sheet for Patients:   BoilerBrush.com.cyhttps://www.fda.gov/media/136312/download  Fact Sheet for Healthcare Providers: https://pope.com/https://www.fda.gov/media/136313/download  This test is not yet approved or  cleared by the [place] FDA and has been authorized for detection and/or diagnosis of SARS-CoV-2 by FDA under an Emergency Use Authorization (EUA).  This EUA will remain in effect (meaning this test can be used) for the duration of the COVID-19 declaration under Section 564(b)(1) of the Act, 21 U.S.C. section 360bbb-3(b)(1), unless the authorization is terminated or revoked sooner.  Performed at [facility], [address] [place], [place] [id number]     Current Facility-Administered Medications  Medication Dose Route Frequency Provider Last Rate Last Admin  . 0.9 %  sodium chloride infusion   Intravenous Continuous Mansy, Jan A, MD 100 mL/hr at 03/25/20 1648 Rate Verify at 03/25/20 1648  . acetaminophen (TYLENOL) tablet 650 mg  650 mg Oral Q6H PRN Mansy, Jan A, MD       Or  . acetaminophen (TYLENOL) suppository 650 mg  650 mg Rectal Q6H PRN Mansy, Jan A, MD      . enoxaparin (LOVENOX) injection 40 mg  40 mg Subcutaneous Q24H Mansy, Jan A, MD   40 mg at 03/25/20 2100  . hydrALAZINE (APRESOLINE) tablet 50 mg  50 mg Oral Q6H PRN Mansy, Jan A, MD      . lamoTRIgine (LAMICTAL) tablet 150 mg  150 mg Oral BID Thana Farreynolds, Leslie, MD   150 mg at 03/26/20 0849  .  levETIRAcetam (KEPPRA) IVPB 1000 mg/100 mL premix  1,000 mg Intravenous Q12H Thana Farreynolds, Leslie, MD 400 mL/hr at 03/26/20 0655 1,000 mg at 03/26/20 0655  . LORazepam (ATIVAN) injection 1 mg  1 mg Intravenous Q1H PRN Mansy, Jan A, MD      . magnesium hydroxide (MILK OF MAGNESIA) suspension 30 mL  30 mL Oral Daily PRN Mansy, Jan A, MD      . ondansetron [facility](ZOFRAN) tablet 4 mg  4 mg Oral Q6H PRN Mansy, Jan A, MD       Or  . ondansetron [facility](ZOFRAN) injection 4 mg  4 mg Intravenous Q6H PRN Mansy, Jan A, MD      . traZODone (DESYREL) tablet 25 mg  25 mg Oral QHS PRN Mansy, Vernetta HoneyJan A, MD        Musculoskeletal: Strength & Muscle Tone: not known  Gait & Station:  Not known  Patient leans: n/a   Psychiatric Specialty Exam: Physical Exam  Review of Systems  Blood pressure 133/85, pulse 82, temperature 98.7 F (37.1 C), temperature source Oral, resp. rate 17, height 5\' 9"  (1.753 m), weight 81.6 kg, SpO2 100 %.Body mass index is 26.58 kg/m.    Mental Status     Thin caucasian male  Oriented to person place time ---  Consciousness not clouded or fluctuant Speech --has some baseline articulation issues Mood odd strange Affect somewhat flat Somewhat anxious in general Thought --contentNo frank psychosis, mania or other related symptoms Memory remote recent and immediate generally intact through various general questions Thought process --illogical, at times with stress Has many catch 22 and double bind answers as to why he does not stay on his medications   Fund of knowledge/ intelligence below average SI and HI --none contracts for  safety  Judgement insight reliability fair to poor  Abstraction --concrete at times                                                     Treatment Plan Summary:  Patient is in denial of primary psych issues He does not want to take medicines and does not feel he needs to go to support or therapy.  He has no IVC criteria and he contracts for  safety  He has multiple Psych factors affecting his issues and decisions but this would take extensive cooperation in therapy and all.  Consult Psych is limited here in this manner here  SW can refer him to psycho therapy or supportive men's groups and or day treatment if he cooperates   He does not present any rational reason for not taking ongoing chronic seizure medications yet does understand with capacity that if he does not take them seizures do result   He could benefit from low dose anxiety medicines but he does not want to take them   I will try to reach his sister as well. To explain and get collateral   I left message at 2:13 pm her mailbox is full.    Disposition: Pending after this phase of neuro intervention and treatment  [name], MD 03/26/2020 1:50 PM

## 2020-03-26 NOTE — TOC Initial Note (Signed)
Transition of Care [facility]) - Initial/Assessment Note    Patient Details  Name: Lance Ray MRN: [id number] Date of Birth: 1970-10-09  Transition of [facility]) CM/SW Contact:    Allayne Butcher, RN Phone Number: 03/26/2020, 12:12 PM  Clinical Narrative:                 Patient admitted to the hospital for seizures.  Patient has had multiple visits to the ER for the same.  This RNCM introduced self to patient and explained role.  Patient seems angry and agitated, when asked how he was doing he says not good because he has been laying on his back for 12 hours.  This RNCM attempted to speak with patient about resources avaialble in [facility] for PCP and for medication management.  Medication management can provide his medications for free.  Patient is not accepting of information so information on resources left at the bedside.  MD has ordered a psych consult before discharge. Patient's sister [name] says that she and her sister Aram Beecham do their best to help with patient but that he will not help himself.  He has a home and they get his groceries and someone to [place] the yard.  [name] brings him to hospital when he has seizures.  Patient has been repeating to the bedside nurse how he would just be better off Dead- patient's sister reports that he says the same thing all the time around her.   Patient may need a taxi voucher at discharge if sister is unable to find a ride for him- she is at work and doesn't get off until 9pm.   Expected Discharge Plan: Home/Self Care Barriers to Discharge: Continued Medical Work up   Patient Goals and CMS Choice Patient states their goals for this hospitalization and ongoing recovery are:: To get out of here      Expected Discharge Plan and Services Expected Discharge Plan: Home/Self Care   Discharge Planning Services: CM Consult, Medication Assistance, Indigent Health Clinic   Living arrangements for the past 2 months: Single Family Home                                       Prior Living Arrangements/Services Living arrangements for the past 2 months: Single Family Home Lives with:: Self Patient language and need for interpreter reviewed:: Yes Do you feel safe going back to the place where you live?: Yes      Need for Family Participation in Patient Care: Yes (Comment) (non compliant, depressed) Care giver support system in place?: Yes (comment) (sisters)   Criminal Activity/Legal Involvement Pertinent to Current Situation/Hospitalization: No - Comment as needed  Activities of Daily Living Home Assistive Devices/Equipment: Eyeglasses ADL Screening (condition at time of admission) Patient's cognitive ability adequate to safely complete daily activities?: Yes Is the patient deaf or have difficulty hearing?: No Does the patient have difficulty seeing, even when wearing glasses/contacts?: No Does the patient have difficulty concentrating, remembering, or making decisions?: No Patient able to express need for assistance with ADLs?: Yes Does the patient have difficulty dressing or bathing?: No Independently performs ADLs?: Yes (appropriate for developmental age) Does the patient have difficulty walking or climbing stairs?: No Weakness of Legs: None Weakness of Arms/Hands: None  Permission Sought/Granted Permission sought to share information with : Case Manager, Family Supports Permission granted to share information with : Yes, Verbal Permission Granted  Share Information with NAME: [name] and Aram Beecham     Permission granted to share info w Relationship: sisters     Emotional Assessment Appearance:: Appears older than stated age Attitude/Demeanor/Rapport: Angry, Uncooperative Affect (typically observed): Agitated, Angry, Depressed Orientation: : Oriented to Self, Oriented to Place, Oriented to  Time, Oriented to Situation Alcohol / Substance Use: Alcohol Use Psych Involvement: No (comment)  Admission diagnosis:   Seizure (HCC) [R56.9] Recurrent seizures (HCC) [G40.909] Patient Active Problem List   Diagnosis Date Noted  . Seizure (HCC) 03/25/2020  . Recurrent seizures (HCC) 03/24/2020   PCP:  Patient, No Pcp Per Pharmacy:   [facility], [place] - [address] [place] [address] Phone: [phone] Fax: [phone]  [id number] - [place], [place] - [address] [place] [address] Phone: [phone] Fax: [phone]  [facility] - [place], [place] - [address] [place] [address] Phone: [phone] Fax: [phone]     Social Determinants of Health (SDOH) Interventions    Readmission Risk Interventions No flowsheet data found.

## 2020-03-27 ENCOUNTER — Inpatient Hospital Stay: Payer: Self-pay

## 2020-03-27 DIAGNOSIS — R471 Dysarthria and anarthria: Secondary | ICD-10-CM

## 2020-03-27 DIAGNOSIS — R270 Ataxia, unspecified: Secondary | ICD-10-CM

## 2020-03-27 MED ORDER — HALOPERIDOL LACTATE 5 MG/ML IJ SOLN
2.0000 mg | Freq: Four times a day (QID) | INTRAMUSCULAR | Status: DC | PRN
  Administered 2020-03-29 – 2020-03-30 (×2): 2 mg via INTRAMUSCULAR
  Filled 2020-03-27 (×2): qty 1

## 2020-03-27 MED ORDER — LORAZEPAM 2 MG/ML IJ SOLN: 1.0000 mg | Freq: Once | INTRAMUSCULAR | Status: DC

## 2020-03-27 MED ORDER — HALOPERIDOL 2 MG PO TABS
2.0000 mg | ORAL_TABLET | Freq: Four times a day (QID) | ORAL | Status: DC | PRN
  Filled 2020-03-27: qty 1

## 2020-03-27 MED ORDER — LORAZEPAM 2 MG/ML IJ SOLN
1.0000 mg | Freq: Once | INTRAMUSCULAR | Status: AC
  Administered 2020-03-27: 1 mg via INTRAVENOUS
  Filled 2020-03-27: qty 1

## 2020-03-27 NOTE — Evaluation (Addendum)
Occupational Therapy Evaluation Patient Details Name: Lance Ray MRN: [id number] DOB: 1971-03-12 Today's Date: 03/27/2020    History of Present Illness Lance Ray  is a 49 y.o. Caucasian male with a known history of seizure disorder, who has not been on his Depakote for months and presented to the emergency room with acute onset of witnessed generalized tonic-clonic seizure at [facility]. He was seen earlier in the ER, observed in the ER for 5 hours without recurrent seizure activityand given a prescription for Depakote ERand dischargedin a good Rx coupon. He had a recurrent tonic-clonicseizure in the parking lot that brought him back to the ER.   Clinical Impression   Lance Ray was seen for OT evaluation this date. Prior to hospital admission, pt was independent in ADLs c assist from sister for IADLs. Pt lives alone in single story home c 1 STE. Pt presents to acute OT demonstrating impaired ADL performance and functional mobility 2/2 functional balance deficits, decreased activity tolerance, decreased safety awareness, and poor command following. Pt currently requires CGA + RW tooth brushing standing c SETUP for objects outside BOS. SUPERVISION for LBD at bed level c lateral LOBs in bed - anticipate MOD A for LBD at EOB. MIN A + RW for ADL t/f c MOD A to correct multiple LOBs during mobility. Pt would benefit from skilled OT to address noted impairments and functional limitations (see below for any additional details) in order to maximize safety and independence while minimizing falls risk and caregiver burden. Upon hospital discharge, recommend CIR to maximize pt safety and return to PLOF.     Follow Up Recommendations  CIR    Equipment Recommendations   (TBD at next venue of care)    Recommendations for Other Services       Precautions / Restrictions Precautions Precautions: Fall Restrictions Weight Bearing Restrictions: No      Mobility Bed Mobility Overal  bed mobility: Needs Assistance Bed Mobility: Supine to Sit;Sit to Supine     Supine to sit: Supervision Sit to supine: Supervision      Transfers Overall transfer level: Needs assistance Equipment used: Rolling walker (2 wheeled) Transfers: Sit to/from Stand Sit to Stand: Min assist         General transfer comment: MIN A + RW sit<>stand at EOB c MOD A to correct LOBs in standing     Balance Overall balance assessment: Needs assistance Sitting-balance support: Single extremity supported;Feet supported Sitting balance-Leahy Scale: Fair     Standing balance support: Bilateral upper extremity supported Standing balance-Leahy Scale: Poor                             ADL either performed or assessed with clinical judgement   ADL Overall ADL's : Needs assistance/impaired                                       General ADL Comments: CGA + RW tooth brushing standing c SETUP for objects outside BOS. SUPERVISION for LBD at bed level c lateral LOBs in bed - anticipate MOD A for LBD at EOB. MIN A + RW for ADL t/f c MOD A to correct multiple LOBs during mobility      Vision         Perception     Praxis      Pertinent Vitals/Pain Pain Assessment: No/denies  pain     Hand Dominance Right   Extremity/Trunk Assessment Upper Extremity Assessment Upper Extremity Assessment: Overall WFL for tasks assessed   Lower Extremity Assessment Lower Extremity Assessment: Generalized weakness       Communication Communication Communication: Expressive difficulties (Word finding and stuttering )   Cognition Arousal/Alertness: Awake/alert Behavior During Therapy: Agitated Overall Cognitive Status: Within Functional Limits for tasks assessed                                 General Comments: Pt repeatedly stating that he is not at baseline; pt grabs OTs arm to emphasize pt's point - appears frustrated c word finding difficulties   General  Comments       Exercises Exercises: Other exercises Other Exercises Other Exercises: Pt educated re: OT role, DME recs, d/c recs, safe RW technique, importance of supervision for mobility  Other Exercises: Bed mobility, sup<>sit, sit<>stand, sitting/standing balance/tolerance, tooth brushing, LBD   Shoulder Instructions      Home Living Family/patient expects to be discharged to:: Private residence Living Arrangements: Alone   Type of Home: House Home Access: Stairs to enter Secretary/administrator of Steps: 1   Home Layout: One level     Bathroom Shower/Tub: Tub/shower unit                    Prior Functioning/Environment Level of Independence: Independent        Comments: Pt reports Independent c I/ADLs including walking to grocery store - per [facility] pt's sister assists for IADLs including groceries/yard work         OT Problem List: Decreased strength;Decreased activity tolerance;Impaired balance (sitting and/or standing);Decreased coordination;Decreased cognition;Decreased safety awareness;Decreased knowledge of use of DME or AE      OT Treatment/Interventions: Self-care/ADL training;Therapeutic exercise;Energy conservation;DME and/or AE instruction;Therapeutic activities;Cognitive remediation/compensation;Balance training;Patient/family education    OT Goals(Current goals can be found in the care plan section) Acute Rehab OT Goals Patient Stated Goal: To walk normally OT Goal Formulation: With patient Time For Goal Achievement: 04/10/20 Potential to Achieve Goals: Fair ADL Goals Pt Will Perform Grooming: with supervision;standing (c LRAD PRN) Pt Will Perform Lower Body Dressing: sit to/from stand;with min guard assist (c LRAD PRN) Pt Will Transfer to Toilet: with supervision;ambulating;bedside commode (c LRAD PRN) Additional ADL Goal #1: Pt will Independently verbalize plan to implement x3 medication management strategies  OT Frequency: Min 2X/week    Barriers to D/C: Decreased caregiver support          Co-evaluation              AM-PAC OT "6 Clicks" Daily Activity     Outcome Measure Help from another person eating meals?: A Little Help from another person taking care of personal grooming?: A Little Help from another person toileting, which includes using toliet, bedpan, or urinal?: A Lot Help from another person bathing (including washing, rinsing, drying)?: A Lot Help from another person to put on and taking off regular upper body clothing?: A Little Help from another person to put on and taking off regular lower body clothing?: A Lot 6 Click Score: 15   End of Session Equipment Utilized During Treatment: Gait belt;Rolling walker Nurse Communication: Mobility status  Activity Tolerance: Patient tolerated treatment well Patient left: in bed;with call bell/phone within reach;with bed alarm set  OT Visit Diagnosis: Unsteadiness on feet (R26.81);Ataxia, unspecified (R27.0)  Time: 7124-5809 OT Time Calculation (min): 23 min Charges:  OT General Charges $OT Visit: 1 Visit OT Evaluation $OT Eval Moderate Complexity: 1 Mod OT Treatments $Self Care/Home Management : 8-22 mins  Kathie Dike, M.S. OTR/L  03/27/20, 11:21 AM

## 2020-03-27 NOTE — Evaluation (Signed)
Physical Therapy Evaluation Patient Details Name: Lance Ray MRN: [id number] DOB: 08-31-1971 Today's Date: 03/27/2020   History of Present Illness  Lance Ray  is a 49 y.o. Caucasian male with a known history of seizure disorder, who has not been on his Depakote for months and presented to the emergency room with acute onset of witnessed generalized tonic-clonic seizure at [facility].  Clinical Impression  Pt was seen for mobility on RW with effort made to control balance with device.  He is willing to try to move, but is struggling to control the planning of steps for gait, has poor balance with ataxic presentation.  Follow up with him on use of heavier walking device, to increase standing endurance and to increase control of walker with more independence as time goes on.    Follow Up Recommendations CIR    Equipment Recommendations  None recommended by PT (awaiting his recovery of safety)    Recommendations for Other Services Rehab consult     Precautions / Restrictions Precautions Precautions: Fall Restrictions Weight Bearing Restrictions: No      Mobility  Bed Mobility Overal bed mobility: Needs Assistance Bed Mobility: Supine to Sit;Sit to Supine     Supine to sit: Supervision Sit to supine: Supervision      Transfers Overall transfer level: Needs assistance Equipment used: Rolling walker (2 wheeled) Transfers: Sit to/from Stand Sit to Stand: Min assist         General transfer comment: min assist but also cues to use AD  Ambulation/Gait Ambulation/Gait assistance: Mod assist Gait Distance (Feet): 90 Feet (45 x 2) Assistive device: Rolling walker (2 wheeled);IV Pole Gait Pattern/deviations: Step-to pattern;Step-through pattern;Wide base of support;Trunk flexed;Staggering right;Staggering left;Leaning posteriorly;Ataxic;Decreased stride length Gait velocity: variable   General Gait Details: ataxia with all devices, sudden lifting of walker and  listing back or to side, wide side kicking steps  Stairs            Wheelchair Mobility    Modified Rankin (Stroke Patients Only)       Balance Overall balance assessment: Needs assistance Sitting-balance support: Feet supported;Single extremity supported Sitting balance-Leahy Scale: Fair     Standing balance support: Bilateral upper extremity supported;During functional activity Standing balance-Leahy Scale: Poor                               Pertinent Vitals/Pain Pain Assessment: No/denies pain    Home Living Family/patient expects to be discharged to:: Private residence Living Arrangements: Alone Available Help at Discharge: Family;Friend(s);Available PRN/intermittently Type of Home: House Home Access: Stairs to enter   [employer] of Steps: 1 Home Layout: One level        Prior Function Level of Independence: Independent         Comments: has family help or walks to the store     Hand Dominance   Dominant Hand: Right    Extremity/Trunk Assessment   Upper Extremity Assessment Upper Extremity Assessment: Defer to OT evaluation    Lower Extremity Assessment Lower Extremity Assessment: Generalized weakness (incoordination on BLE's, control of gait is poor)    Cervical / Trunk Assessment Cervical / Trunk Assessment: Normal  Communication   Communication: Expressive difficulties (trouble with word finding)  Cognition Arousal/Alertness: Awake/alert Behavior During Therapy: Agitated;Anxious Overall Cognitive Status: No family/caregiver present to determine baseline cognitive functioning  General Comments: pt is struggling to follow commands with walker management, may be compliance and may be motor control      General Comments General comments (skin integrity, edema, etc.): pt was seen for mobility on RW, then IV pole as comparison and both are not easy for him to control.   Bariatric walker is next option.    Exercises Other Exercises Other Exercises: Pt educated re: OT role, DME recs, d/c recs, safe RW technique, importance of supervision for mobility  Other Exercises: Bed mobility, sup<>sit, sit<>stand, sitting/standing balance/tolerance, tooth brushing, LBD   Assessment/Plan    PT Assessment Patient needs continued PT services  PT Problem List Decreased strength;Decreased range of motion;Decreased activity tolerance;Decreased balance;Decreased mobility;Decreased coordination;Decreased cognition;Decreased knowledge of use of DME;Decreased safety awareness;Decreased knowledge of precautions;Decreased skin integrity       PT Treatment Interventions Gait training;Stair training;DME instruction;Functional mobility training;Therapeutic activities;Therapeutic exercise;Balance training;Neuromuscular re-education;Patient/family education    PT Goals (Current goals can be found in the Care Plan section)  Acute Rehab PT Goals Patient Stated Goal: To walk normally PT Goal Formulation: With patient Time For Goal Achievement: 04/10/20 Potential to Achieve Goals: Fair    Frequency Min 2X/week (will increase frequency if stroke is diagnosed)   Barriers to discharge Decreased caregiver support home alone with one step    Co-evaluation               AM-PAC PT "6 Clicks" Mobility  Outcome Measure Help needed turning from your back to your side while in a flat bed without using bedrails?: A Little Help needed moving from lying on your back to sitting on the side of a flat bed without using bedrails?: A Little Help needed moving to and from a bed to a chair (including a wheelchair)?: A Lot Help needed standing up from a chair using your arms (e.g., wheelchair or bedside chair)?: A Lot Help needed to walk in hospital room?: A Lot Help needed climbing 3-5 steps with a railing? : Total 6 Click Score: 13    End of Session Equipment Utilized During Treatment:  Gait belt Activity Tolerance: Treatment limited secondary to medical complications (Comment);Treatment limited secondary to agitation Patient left: in bed;with call bell/phone within reach;with bed alarm set Nurse Communication: Mobility status PT Visit Diagnosis: Unsteadiness on feet (R26.81);Muscle weakness (generalized) (M62.81);Other abnormalities of gait and mobility (R26.89);Ataxic gait (R26.0)    Time: 4970-2637 PT Time Calculation (min) (ACUTE ONLY): 40 min   Charges:   PT Evaluation $PT Eval Moderate Complexity: 1 Mod PT Treatments $Gait Training: 23-37 mins       Ivar Drape 03/27/2020, 1:19 PM  Samul Dada, PT MS Acute Rehab Dept. Number: [facility] [id number] and [facility] [phone]

## 2020-03-27 NOTE — Progress Notes (Signed)
PROGRESS NOTE  ZADIN LANGE RUE:[id number] DOB: 01-Nov-1970 DOA: 03/24/2020 PCP: Patient, No Pcp Per  Brief History   Kalil Woessner  is a 49 y.o. Caucasian male with a known history of seizure disorder, who has not been on his Depakote for months and presented to the emergency room with acute onset of witnessed generalized tonic-clonic seizure at [facility].  He was seen earlier in the ER, observed in the ER for 5 hours without recurrent seizure activity and given a prescription for Depakote ER and discharged in a good Rx coupon.  He had a recurrent tonic-clonic seizure in the parking lot that brought him back to the ER.  He denied any tongue bites.  He was fairly somnolent during my interview after being given IV Ativan and Keppra but was arousable.  He denied any headache or dizziness or blurred vision.  No paresthesias or focal muscle weakness.  He stated that he has not taken his Depakote for 6 months and Lamictal for 2 weeks.  No chest pain or dyspnea or cough or wheezing.  No nausea or vomiting or abdominal pain.  No fever or chills.  Upon presentation to the emergency room, blood pressure was 141/88 with otherwise normal vital signs.  Earlier it was 128/89.  Labs reviewed potassium of 3.6 with otherwise normal BMP and CBC was within normal.  Noncontrasted head CT scan revealed no acute intracranial abnormalities.EKG showed sinus tachycardia with a rate of 115 with probable left atrial enlargement.  The patient was given 1 mg of IV lorazepam and was loaded with 1 g of IV Keppra.  He will be admitted to an observation PCU bed for further evaluation and management.  [facility] were consulted to admit the patient for further evaluation and treatment. The patient had been taking depakote for many years according to his sister. She stated that she did not think that it worked well. She understood that when the patient presented to [facility] with a seizure a while ago that was why they had  loaded the patient with keppra and started him on that. According to the sister the pattern is that the patient will be seen at a hospital ED after a seizure. He will be set up with one month of meds, and then he will stop taking his meds when he runs out. He will not act in his own self interest to maintain his health. They have looked into getting medicaid for him, but the patient is not cooperative with the process.  Psychiatry has been consulted to evaluate the patient's depression. Transitions of Care has been consulted to help set the patient up with outpatient mental health clinic and to help the patient to set up an ongoing source of antiepileptics.  This morning the patient is complaining of dysarthria and difficulty walking. Per PT who is in the room, the patient is ataxic. I observe him trying to get back in bed. He is very unsteady on his feet.  Consultants  . Neurology  Procedures  . None  Antibiotics   Anti-infectives (From admission, onward)   None     Subjective  The patient is awake and alert this morning. He is concerned about "damage to his brain" from the last seizures. He is concerned about difficulty with speech and ambulation.  Objective   Vitals:  Vitals:   03/27/20 0414 03/27/20 0735  BP: (!) 148/99 (!) 137/96  Pulse: 86 86  Resp: 16 17  Temp: 98.5 F (36.9 C) 98.7 F (  37.1 C)  SpO2: 100% 99%    Exam:  Constitutional:  The patient is awake, alert, and oriented x 3. Speech is dysarthric and he is having difficulty walking and with balance. Respiratory:  No increased work of breathing. No wheezes, rales, or rhonchi No tactile fremitus Cardiovascular:  Regular rate and rhythm No murmurs, ectopy, or gallups. No lateral PMI. No thrills. Abdomen:  Abdomen is soft, non-tender, non-distended No hernias, masses, or organomegaly Normoactive bowel sounds.  Musculoskeletal:  No cyanosis, clubbing, or edema Skin:  No rashes, lesions, ulcers palpation  of skin: no induration or nodules Neurologic:  CN 2-12 intact Sensation all 4 extremities intact Gait ataxic Speech dysarthric Psychiatric:  Mental status Mood, affect appropriate Orientation to person, place, time  judgment and insight appear intact  I have personally reviewed the following:   Today's Data  . Vitals  Imaging  . CT head . MRI - pending  Cardiology Data  . EKG  Scheduled Meds: . enoxaparin (LOVENOX) injection  40 mg Subcutaneous Q24H  . lamoTRIgine  150 mg Oral BID   Continuous Infusions: . sodium chloride 100 mL/hr at 03/27/20 0844  . levETIRAcetam 1,000 mg (03/27/20 0847)    Active Problems:   Recurrent seizures (HCC)   Seizure (HCC)   LOS: 2 days   A & P  Breakthrough seizures likely secondary to medical noncompliance with subsequent fall and left temporoparietal contusion and abrasions: According to the patient's sister he has no way to support himself. His family owns the house he lives in. He goes to ED's with seizures when he runs out of meds and gets another months worth of medication. Will consult TOC to see what we can do. Dr. Thad Ranger assistance is greatly appreciated. The patient will be continued on keppra as it seems that he was not doing well on the depakote anyway. The patient's sensorium is much improved today. TOC has been consulted to help the patient with ongoing source of his antiepileptic medications.  Dysarthria and ataxia: New neurological changes per patient. Will check MRI and thiamine and start IV thiamine. CT head was negative on admission. DDx: CVA vs Wernickes vs effects of new neuroleptics, although the patient has been on keppra in the past and did not have any difficulty with it.  Likely depression: Psychiatry has evaluated the patient. They do feel that he would benefit from outpatient treatment, although the patient refuses medication. Psych does not feel that the patient would benefit from inpatient  psych.  Hypertension: Likely due to seizures. Resolved now. Will continue to monitor.  I have seen and examined this patient myself. I have spent 38 minutes in his evaluation and care.  DVT prophylaxis. Lovenox. CODE STATUS: Full Code Family Communication: Discussed patient at length with his sister at bedside with Dr. Thad Ranger. All questions answered to the best of my ability. Disposition: The patient is from home. Anticipate discharge to home. Barriers to discharge: New neurological changes. Complete work up and determine appropriate treatment going forward..  Status is: Inpatient  Remains inpatient appropriate because:Unsafe d/c plan   Dispo: The patient is from: Home              Anticipated d/c is to: Home              Anticipated d/c date is: 1 day              Patient currently is not medically stable to d/c. Whitney Hillegass, DO [facility] Direct contact: see [email or web address]  7PM-7AM contact night coverage as above 03/27/2020, 10:40 PM  LOS: 0 days

## 2020-03-27 NOTE — Progress Notes (Signed)
Inpatient Rehab Admissions Coordinator Note:   Therapy is recommending CIR for this patient. Will hold off on screening this patient until neuro work up is complete and there is a better picture of medical necessity.   Megan Salon, MS, CCC-SLP Rehab Admissions Coordinator  [phone] (celll) [phone] (office)

## 2020-03-28 LAB — CBC
HCT: 32.6 % — ABNORMAL LOW (ref 39.0–52.0)
Hemoglobin: 11.1 g/dL — ABNORMAL LOW (ref 13.0–17.0)
MCH: 29.8 pg (ref 26.0–34.0)
MCHC: 34 g/dL (ref 30.0–36.0)
MCV: 87.6 fL (ref 80.0–100.0)
Platelets: 213 10*3/uL (ref 150–400)
RBC: 3.72 MIL/uL — ABNORMAL LOW (ref 4.22–5.81)
RDW: 14.1 % (ref 11.5–15.5)
WBC: 4.2 10*3/uL (ref 4.0–10.5)
nRBC: 0 % (ref 0.0–0.2)

## 2020-03-28 LAB — BASIC METABOLIC PANEL
Anion gap: 4 — ABNORMAL LOW (ref 5–15)
BUN: 6 mg/dL (ref 6–20)
CO2: 28 mmol/L (ref 22–32)
Calcium: 8.1 mg/dL — ABNORMAL LOW (ref 8.9–10.3)
Chloride: 111 mmol/L (ref 98–111)
Creatinine, Ser: 0.75 mg/dL (ref 0.61–1.24)
GFR calc Af Amer: >60 mL/min (ref >60)
GFR calc non Af Amer: >60 mL/min (ref >60)
Glucose, Bld: 92 mg/dL (ref 70–99)
Potassium: 3.4 mmol/L — ABNORMAL LOW (ref 3.5–5.1)
Sodium: 143 mmol/L (ref 135–145)

## 2020-03-28 MED ORDER — CLONAZEPAM 0.25 MG PO TBDP
0.2500 mg | ORAL_TABLET | Freq: Two times a day (BID) | ORAL | Status: DC
  Administered 2020-03-28 – 2020-03-29 (×3): 0.25 mg via ORAL
  Filled 2020-03-28 (×3): qty 1

## 2020-03-28 MED ORDER — THIAMINE HCL 100 MG/ML IJ SOLN
100.0000 mg | Freq: Every day | INTRAMUSCULAR | Status: DC
  Administered 2020-03-28 – 2020-04-04 (×8): 100 mg via INTRAVENOUS
  Filled 2020-03-28 (×8): qty 2

## 2020-03-28 MED ORDER — CLONAZEPAM 0.5 MG PO TABS: 0.5000 mg | ORAL_TABLET | Freq: Two times a day (BID) | ORAL | Status: DC

## 2020-03-28 MED ORDER — HALOPERIDOL 0.5 MG PO TABS
0.2500 mg | ORAL_TABLET | Freq: Three times a day (TID) | ORAL | Status: DC
  Administered 2020-03-28 – 2020-03-29 (×3): 0.25 mg via ORAL
  Filled 2020-03-28 (×5): qty 0.5

## 2020-03-28 MED ORDER — POTASSIUM CHLORIDE 10 MEQ/100ML IV SOLN
10.0000 meq | INTRAVENOUS | Status: AC
  Administered 2020-03-28 (×4): 10 meq via INTRAVENOUS
  Filled 2020-03-28 (×4): qty 100

## 2020-03-28 NOTE — Progress Notes (Signed)
PROGRESS NOTE  [name] VVO:[id number] DOB: Nov 10, 1970 DOA: 03/24/2020 PCP: Patient, No Pcp Per  Brief History   Lance Ray  is a 49 y.o. Caucasian male with a known history of seizure disorder, who has not been on his Depakote for months and presented to the emergency room with acute onset of witnessed generalized tonic-clonic seizure at [facility].  He was seen earlier in the ER, observed in the ER for 5 hours without recurrent seizure activity and given a prescription for Depakote ER and discharged in a good Rx coupon.  He had a recurrent tonic-clonic seizure in the parking lot that brought him back to the ER.  He denied any tongue bites.  He was fairly somnolent during my interview after being given IV Ativan and Keppra but was arousable.  He denied any headache or dizziness or blurred vision.  No paresthesias or focal muscle weakness.  He stated that he has not taken his Depakote for 6 months and Lamictal for 2 weeks.  No chest pain or dyspnea or cough or wheezing.  No nausea or vomiting or abdominal pain.  No fever or chills.  Upon presentation to the emergency room, blood pressure was 141/88 with otherwise normal vital signs.  Earlier it was 128/89.  Labs reviewed potassium of 3.6 with otherwise normal BMP and CBC was within normal.  Noncontrasted head CT scan revealed no acute intracranial abnormalities.EKG showed sinus tachycardia with a rate of 115 with probable left atrial enlargement.  The patient was given 1 mg of IV lorazepam and was loaded with 1 g of IV Keppra.  He will be admitted to an observation PCU bed for further evaluation and management.  [facility] were consulted to admit the patient for further evaluation and treatment. The patient had been taking depakote for many years according to his sister. She stated that she did not think that it worked well. She understood that when the patient presented to [facility] with a seizure a while ago that was why they had  loaded the patient with keppra and started him on that. According to the sister the pattern is that the patient will be seen at a hospital ED after a seizure. He will be set up with one month of meds, and then he will stop taking his meds when he runs out. He will not act in his own self interest to maintain his health. They have looked into getting medicaid for him, but the patient is not cooperative with the process.  Psychiatry has been consulted to evaluate the patient's depression. Transitions of Care has been consulted to help set the patient up with outpatient mental health clinic and to help the patient to set up an ongoing source of antiepileptics.  On the morning of  the patient is complaining of dysarthria and difficulty walking. Per PT who is in the room, the patient is ataxic. I observe him trying to get back in bed. He is very unsteady on his feet. CT and MRI performed and were negative for acute pathology.  Later on 03/27/2020, after he had been seen and cleared by psychiatry, he required a sitter, because he was found by nursing standing in the middle of his bed "trying to fly like superman". Psychiatry asked to return to re-evaluate the patient.   Consultants  . Neurology . Psychiatry  Procedures  . None  Antibiotics   Anti-infectives (From admission, onward)   None     Subjective  The patient is awake and  alert this morning. He states that he feels better than yesterday.  Objective   Vitals:  Vitals:   03/27/20 0735 03/27/20 1930  BP: (!) 137/96 (!) 143/94  Pulse: 86 66  Resp: 17 15  Temp: 98.7 F (37.1 C) 98.8 F (37.1 C)  SpO2: 99% 95%    Exam:  Constitutional:  The patient is awake, alert, and oriented x 3. Speech is clear today.  Respiratory:  No increased work of breathing. No wheezes, rales, or rhonchi No tactile fremitus Cardiovascular:  Regular rate and rhythm No murmurs, ectopy, or gallups. No lateral PMI. No thrills. Abdomen:  Abdomen is  soft, non-tender, non-distended No hernias, masses, or organomegaly Normoactive bowel sounds.  Musculoskeletal:  No cyanosis, clubbing, or edema Skin:  No rashes, lesions, ulcers palpation of skin: no induration or nodules Neurologic:  CN 2-12 intact Sensation all 4 extremities intact Gait ataxic Speech dysarthric Psychiatric:  Mental status Mood, affect congruent Orientation to person, place, time  judgment and insight are questionable. I have personally reviewed the following:   Today's Data  . Vitals, BMP, CBC  Imaging  . CT head . MRI brain  Cardiology Data  . EKG  Scheduled Meds: . enoxaparin (LOVENOX) injection  40 mg Subcutaneous Q24H  . lamoTRIgine  150 mg Oral BID   Continuous Infusions: . sodium chloride 100 mL/hr at 03/28/20 0946  . levETIRAcetam Stopped (03/28/20 0629)  . potassium chloride 100 mL/hr at 03/28/20 0865    Active Problems:   Recurrent seizures (HCC)   Seizure (HCC)   LOS: 3 days   A & P  Breakthrough seizures likely secondary to medical noncompliance with subsequent fall and left temporoparietal contusion and abrasions: According to the patient's sister he has no way to support himself. His family owns the house he lives in. He goes to ED's with seizures when he runs out of meds and gets another months worth of medication. Will consult TOC to see what we can do. Dr. Thad Ranger assistance is greatly appreciated. The patient will be continued on keppra as it seems that he was not doing well on the depakote anyway. The patient's sensorium is much improved today. TOC has been consulted to help the patient with ongoing source of his antiepileptic medications.  Dysarthria and ataxia: New neurological changes per patient. CT brain and MRI negative for acute pathology. Will check thiamine and start IV thiamine. CT head was negative on admission. DDx:  Wernickes vs effects of new neuroleptics, although the patient has been on keppra in the past and did  not have any difficulty with it.  Likely depression: Psychiatry has evaluated the patient. They do feel that he would benefit from outpatient treatment, although the patient refuses medication. Psych does not feel that the patient would benefit from inpatient psych.  Hypertension: Likely due to seizures. Resolved now. Will continue to monitor.  I have seen and examined this patient myself. I have spent 35 minutes in his evaluation and care.  DVT prophylaxis. Lovenox. CODE STATUS: Full Code Family Communication: None available. Disposition: The patient is from home. Anticipate discharge to home vs SNF given his difficulties with ambulation. Barriers to discharge: New neurological changes. Complete work up and determine appropriate treatment going forward..  Status is: Inpatient  Remains inpatient appropriate because:Unsafe d/c plan   Dispo: The patient is from: Home              Anticipated d/c is to: SNF  Anticipated d/c date is: 1 day              Patient currently is not medically stable to d/c. Marykate Heuberger, DO [facility] Direct contact: see [email or web address]  7PM-7AM contact night coverage as above 03/28/2020, 10:18 PM  LOS: 0 days

## 2020-03-28 NOTE — Progress Notes (Signed)
Patient ID: Lance Ray, male   DOB: October 10, 1970, 49 y.o.   MRN: [id number]   Rama Candise Bowens MD  Psychiatry Very brief note  Patient was seen after reportedly anxious and psychotic behavior--was on his bed trying to fly, acting strange and odd and having disorganized thought.  Was given prn meds and today seems to be somewhat more grounded and stable.   Asked about his issues he does recall the strange events but has no explanation   Attempting to contact family.  Last week did not get answer  I have started very low dose regular haldol, klonopin and possibly low dose depression meds   Currently  He is alert cooperative Oriented to person place and time Not frankly psychotic --now with 1:1 sitter No active SI and plans  He is less psychotic and all post prn meds for now   Will recheck over this coming week   [name] MD

## 2020-03-28 NOTE — Progress Notes (Signed)
Inpatient Rehab Admissions Coordinator Note:   Per therapy recommendations, pt was screened for CIR candidacy by Megan Salon, MS CCC-SLP. At this time, Pt. Appears to have functional decline but neuro work-up is negative and Pt. Does not demonstrate medical necessity for CIR admission. I also spoke with Pt. And he stated he is not interested in pursuing CIR admission due to uninsured status and inability to secure 24/7 support following discharge. Pt. Would benefit from [facility] or outpatient rehab following d/c.   Megan Salon, MS, CCC-SLP Rehab Admissions Coordinator  [phone] (celll) [phone] (office)

## 2020-03-29 LAB — BASIC METABOLIC PANEL
Anion gap: 8 (ref 5–15)
BUN: 8 mg/dL (ref 6–20)
CO2: 26 mmol/L (ref 22–32)
Calcium: 8.8 mg/dL — ABNORMAL LOW (ref 8.9–10.3)
Chloride: 107 mmol/L (ref 98–111)
Creatinine, Ser: 1.05 mg/dL (ref 0.61–1.24)
GFR calc Af Amer: >60 mL/min (ref >60)
GFR calc non Af Amer: >60 mL/min (ref >60)
Glucose, Bld: 81 mg/dL (ref 70–99)
Potassium: 4.2 mmol/L (ref 3.5–5.1)
Sodium: 141 mmol/L (ref 135–145)

## 2020-03-29 LAB — VALPROIC ACID LEVEL, FREE: Valproic Acid, Free: 11.5 ug/mL (ref 6.0–22.0)

## 2020-03-29 MED ORDER — HALOPERIDOL 0.5 MG PO TABS
0.5000 mg | ORAL_TABLET | Freq: Three times a day (TID) | ORAL | Status: DC
  Administered 2020-03-30 – 2020-04-01 (×5): 0.5 mg via ORAL
  Filled 2020-03-29 (×11): qty 1

## 2020-03-29 MED ORDER — DIPHENHYDRAMINE HCL 50 MG/ML IJ SOLN
INTRAMUSCULAR | Status: AC
  Filled 2020-03-29: qty 1

## 2020-03-29 MED ORDER — SODIUM CHLORIDE 0.9 % IV SOLN
INTRAVENOUS | Status: DC | PRN
  Administered 2020-03-29 – 2020-04-03 (×2): 250 mL via INTRAVENOUS

## 2020-03-29 MED ORDER — HALOPERIDOL LACTATE 5 MG/ML IJ SOLN
5.0000 mg | Freq: Once | INTRAMUSCULAR | Status: AC
  Administered 2020-03-29: 5 mg via INTRAMUSCULAR
  Filled 2020-03-29: qty 1

## 2020-03-29 MED ORDER — BENZTROPINE MESYLATE 0.5 MG PO TABS
0.5000 mg | ORAL_TABLET | Freq: Two times a day (BID) | ORAL | Status: DC
  Filled 2020-03-29 (×4): qty 1

## 2020-03-29 MED ORDER — CLONAZEPAM 0.5 MG PO TBDP
0.5000 mg | ORAL_TABLET | Freq: Three times a day (TID) | ORAL | Status: DC
  Administered 2020-03-30 – 2020-04-01 (×5): 0.5 mg via ORAL
  Filled 2020-03-29 (×6): qty 1

## 2020-03-29 MED ORDER — DULOXETINE HCL 20 MG PO CPEP
20.0000 mg | ORAL_CAPSULE | Freq: Every day | ORAL | Status: DC
  Administered 2020-04-01 – 2020-04-06 (×6): 20 mg via ORAL
  Filled 2020-03-29 (×9): qty 1

## 2020-03-29 MED ORDER — DIPHENHYDRAMINE HCL 50 MG/ML IJ SOLN
50.0000 mg | Freq: Four times a day (QID) | INTRAMUSCULAR | Status: DC | PRN
  Administered 2020-03-29 – 2020-03-31 (×3): 50 mg via INTRAMUSCULAR
  Filled 2020-03-29 (×3): qty 1

## 2020-03-29 MED ORDER — OLANZAPINE 10 MG IM SOLR
10.0000 mg | Freq: Four times a day (QID) | INTRAMUSCULAR | Status: DC | PRN
  Administered 2020-03-29 – 2020-03-30 (×2): 10 mg via INTRAMUSCULAR
  Filled 2020-03-29 (×5): qty 10

## 2020-03-29 MED ORDER — DIPHENHYDRAMINE HCL 50 MG PO CAPS
50.0000 mg | ORAL_CAPSULE | Freq: Four times a day (QID) | ORAL | Status: DC | PRN
  Filled 2020-03-29: qty 1

## 2020-03-29 MED ORDER — HALOPERIDOL 5 MG PO TABS
5.0000 mg | ORAL_TABLET | Freq: Once | ORAL | Status: AC
  Filled 2020-03-29: qty 1

## 2020-03-29 NOTE — TOC Progression Note (Signed)
Transition of Care [facility]) - Progression Note    Patient Details  Name: DEMARI GALES MRN: [id number] Date of Birth: 1971-05-31  Transition of Care [facility]) CM/SW Contact  Barrie Dunker, RN Phone Number: 03/29/2020, 2:34 PM  Clinical Narrative:     Had a discussion with the patient's 2 sisters, They both agree that the patient will do as he wants and will not be willing to go to any rehab facility of any kind, The stated that he lives at home alone and lives like a hermit refusing to let anyone into his home.  He often is not willing to take medication that is prescribed.   She stated that she understands that once the IVC is expired he will be able to go home and do as he pleases.  He does not work outside the home. Both sisters have been helping him with his bills and encourage him to go to DSS to get additional help, she stated the patient refuses to do so.   He will need a taxi voucher once released from the hospital as both sisters work and will not be able to come and pick him up to take him home,   TOC will continue to follow for needs Expected Discharge Plan: Home/Self Care Barriers to Discharge: Continued Medical Work up  Expected Discharge Plan and Services Expected Discharge Plan: Home/Self Care   Discharge Planning Services: CM Consult, Medication Assistance, Indigent Health Clinic   Living arrangements for the past 2 months: Single Family Home                                       Social Determinants of Health (SDOH) Interventions    Readmission Risk Interventions No flowsheet data found.

## 2020-03-29 NOTE — Progress Notes (Signed)
[facility]  03/29/2020 2:03 PM Lance Ray  MRN:  [id number] Subjective:  OOC issues  Principal Problem:  OOC behaviors required IM medications and IVC Diagnosis: Active Problems:   Recurrent seizures (HCC)   Seizure (HCC)  History of TBI  History of ETOH dependence Major depression severe recurrent Personality disorder NOS   Total Time spent with patient:  Greater than one hour    Past Psychiatric History:  No previous treatment for psychiatry --was seen last week and given regular psych meds   Past Medical History:  Past Medical History:  Diagnosis Date  . Seizures (HCC)     Past Surgical History:  Procedure Laterality Date  . SKIN GRAFT     Family History:  Family History  Problem Relation Age of Onset  . Seizures Mother    Family Psychiatric  History:  Dad and three uncles with history of major depression and ETOH dependence, one uncle completed suicide    Social History: History from step sister who finally was reached and was in house  He has had no previous psych treatment but issues with seizures since age 49 --basically living alone in family supported house but regularly takes ETOH and has no major job or disability income or care  Sister is going to try to get power of attorney and possible guardianship  Social History   Substance and Sexual Activity  Alcohol Use Yes  . Alcohol/week: 2.0 standard drinks  . Types: 2 Shots of liquor per week   Comment: "rarely"      Social History   Substance and Sexual Activity  Drug Use No    Social History   Socioeconomic History  . Marital status: Single    Spouse name: Not on file  . Number of children: Not on file  . Years of education: Not on file  . Highest education level: Not on file  Occupational History  . Not on file  Tobacco Use  . Smoking status: Current Some Day Smoker  . Smokeless tobacco: Current User  Vaping Use  . Vaping Use: Never used  Substance and Sexual Activity  .  Alcohol use: Yes    Alcohol/week: 2.0 standard drinks    Types: 2 Shots of liquor per week    Comment: "rarely"   . Drug use: No  . Sexual activity: Yes  Other Topics Concern  . Not on file  Social History Narrative  . Not on file   Social Determinants of Health   Financial Resource Strain:   . Difficulty of Paying Living Expenses:   Food Insecurity:   . Worried About Programme researcher, broadcasting/film/videounning Out of Food in the Last Year:   . Baristaan Out of Food in the Last Year:   Transportation Needs:   . Freight forwarderLack of Transportation (Medical):   [name]. Lack of Transportation (Non-Medical):   Physical Activity:   . Days of Exercise per Week:   . Minutes of Exercise per Session:   Stress:   . Feeling of Stress :   Social Connections:   . Frequency of Communication with Friends and Family:   . Frequency of Social Gatherings with Friends and Family:   . Attends Religious Services:   . Active Member of Clubs or Organizations:   . Attends BankerClub or Organization Meetings:   [name]. Marital Status:       patient required IM benadryl, ativan haldol today after trying to walk and run in the halls.  He continues in house mgt of  seizures, medications and now psych meds, pending regular depression medications if he will take them   An IVC was filed today  Due to the above issues, pending stabilization                     Sleep:  Less due to all issues   Appetite:   Normal   Current Medications: Current Facility-Administered Medications  Medication Dose Route Frequency Provider Last Rate Last Admin  . acetaminophen (TYLENOL) tablet 650 mg  650 mg Oral Q6H PRN Mansy, Jan A, MD       Or  . acetaminophen (TYLENOL) suppository 650 mg  650 mg Rectal Q6H PRN Mansy, Jan A, MD      . clonazePAM Scarlette Calico) disintegrating tablet 0.25 mg  0.25 mg Oral BID [name], MD   0.25 mg at 03/29/20 0858  . diphenhydrAMINE (BENADRYL) 50 MG/ML injection           . diphenhydrAMINE (BENADRYL) capsule 50 mg  50 mg Oral Q6H PRN [name], MD       Or  . diphenhydrAMINE (BENADRYL) injection 50 mg  50 mg Intramuscular Q6H PRN [name], MD   50 mg at 03/29/20 1240  . enoxaparin (LOVENOX) injection 40 mg  40 mg Subcutaneous Q24H Mansy, Jan A, MD   40 mg at 03/28/20 2246  . haloperidol (HALDOL) tablet 0.25 mg  0.25 mg Oral TID [name], MD   0.25 mg at 03/29/20 0859  . haloperidol (HALDOL) tablet 2 mg  2 mg Oral Q6H PRN Swayze, Ava, DO       Or  . haloperidol lactate (HALDOL) injection 2 mg  2 mg Intramuscular Q6H PRN Swayze, Ava, DO   2 mg at 03/29/20 1156  . hydrALAZINE (APRESOLINE) tablet 50 mg  50 mg Oral Q6H PRN Mansy, Jan A, MD      . lamoTRIgine (LAMICTAL) tablet 150 mg  150 mg Oral BID Thana Farr, MD   150 mg at 03/29/20 0859  . levETIRAcetam (KEPPRA) IVPB 1000 mg/100 mL premix  1,000 mg Intravenous Q12H Thana Farr, MD 400 mL/hr at 03/29/20 0629 1,000 mg at 03/29/20 0629  . LORazepam (ATIVAN) injection 1 mg  1 mg Intravenous Q1H PRN Mansy, Jan A, MD   1 mg at 03/29/20 1204  . magnesium hydroxide (MILK OF MAGNESIA) suspension 30 mL  30 mL Oral Daily PRN Mansy, Jan A, MD      . OLANZapine (ZYPREXA) injection 10 mg  10 mg Intramuscular Q6H PRN [name], MD   10 mg at 03/29/20 1243  . ondansetron (ZOFRAN) tablet 4 mg  4 mg Oral Q6H PRN Mansy, Jan A, MD       Or  . ondansetron [facility]) injection 4 mg  4 mg Intravenous Q6H PRN Mansy, Jan A, MD      . thiamine (B-1) injection 100 mg  100 mg Intravenous Daily Swayze, Ava, DO   100 mg at 03/29/20 0857  . traZODone (DESYREL) tablet 25 mg  25 mg Oral QHS PRN Mansy, Jan A, MD   25 mg at 03/27/20 2006    Lab Results:  Results for orders placed or performed during the hospital encounter of 03/24/20 (from the past 48 hour(s))  CBC     Status: Abnormal   Collection Time: 03/28/20  6:05 AM  Result Value Ref Range   WBC 4.2 4.0 - 10.5 K/uL   RBC 3.72 (L) 4.22 - 5.81 MIL/uL   Hemoglobin 11.1 (  L) 13.0 - 17.0 g/dL   HCT 25.9 (L) 39 - 52 %    MCV 87.6 80.0 - 100.0 fL   MCH 29.8 26.0 - 34.0 pg   MCHC 34.0 30.0 - 36.0 g/dL   RDW 56.3 87.5 - 64.3 %   Platelets 213 150 - 400 K/uL   nRBC 0.0 0.0 - 0.2 %    Comment: Performed at [facility], [address], [place], [place] [address]  Basic metabolic panel     Status: Abnormal   Collection Time: 03/28/20  6:05 AM  Result Value Ref Range   Sodium 143 135 - 145 mmol/L   Potassium 3.4 (L) 3.5 - 5.1 mmol/L   Chloride 111 98 - 111 mmol/L   CO2 28 22 - 32 mmol/L   Glucose, Bld 92 70 - 99 mg/dL    Comment: Glucose reference range applies only to samples taken after fasting for at least 8 hours.   BUN 6 6 - 20 mg/dL   Creatinine, Ser 8.84 0.61 - 1.24 mg/dL   Calcium 8.1 (L) 8.9 - 10.3 mg/dL   GFR calc non Af Amer >60 >60 mL/min   GFR calc Af Amer >60 >60 mL/min   Anion gap 4 (L) 5 - 15    Comment: Performed at [facility], [address] [place], [place] [address]  Basic metabolic panel     Status: Abnormal   Collection Time: 03/29/20  7:19 AM  Result Value Ref Range   Sodium 141 135 - 145 mmol/L   Potassium 4.2 3.5 - 5.1 mmol/L   Chloride 107 98 - 111 mmol/L   CO2 26 22 - 32 mmol/L   Glucose, Bld 81 70 - 99 mg/dL    Comment: Glucose reference range applies only to samples taken after fasting for at least 8 hours.   BUN 8 6 - 20 mg/dL   Creatinine, Ser 3.01 0.61 - 1.24 mg/dL   Calcium 8.8 (L) 8.9 - 10.3 mg/dL   GFR calc non Af Amer >60 >60 mL/min   GFR calc Af Amer >60 >60 mL/min   Anion gap 8 5 - 15    Comment: Performed at [facility], [address] [place], [place] [address]    Blood Alcohol level:  Lab Results  Component Value Date   ETH <10 11/03/2019   ETH <5 12/05/2016    Metabolic Disorder Labs: No results found for: HGBA1C, MPG No results found for: PROLACTIN No results found for: CHOL, TRIG, HDL, CHOLHDL, VLDL, LDLCALC  Physical Findings: AIMS:  , ,  ,  ,   not done  CIWA:    COWS:     Musculoskeletal: Strength &  Muscle Tone:  Same Gait & Station: --was being held down  Patient leans: ---n/a   Psychiatric Specialty Exam: Physical Exam  Review of Systems  Blood pressure 96/60, pulse (!) 115, temperature 98.7 F (37.1 C), temperature source Oral, resp. rate 18, height 5\' 9"  (1.753 m), weight 81.6 kg, SpO2 91 %.Body mass index is 26.58 kg/m.  Brief  Mental status  Patient was sedated after being held by security and staff He was agitated and OOC with poor judgement and insight  He was not overtly more psychotic, but rather seemed more of being oppositional and argumentative ---in this process No outbursts of wanting to harm himself however.     He remained alert, not cooperative but was acutely combative  Danger to Self and Others, threats to others as  well  Consciousness was not clouded or fluctuant  Concentration and attention were impaired Mood was angry and elevated Affect the same  He did not express imminent suicidal statements however                                                   Remains on unit with prn psych meds, regular meds including those for depression and mood swings along with seizure meds---  Sister pending --for --issues with the court she says   Treatment Plan Summary:  as above   [name], MD 03/29/2020, 2:03 PM

## 2020-03-29 NOTE — Progress Notes (Signed)
PROGRESS NOTE  Lance Ray QMG:[id number] DOB: 1970-11-23 DOA: 03/24/2020 PCP: Patient, No Pcp Per  Brief History   Lance Ray  is a 49 y.o. Caucasian male with a known history of seizure disorder, who has not been on his Depakote for months and presented to the emergency room with acute onset of witnessed generalized tonic-clonic seizure at [facility].  He was seen earlier in the ER, observed in the ER for 5 hours without recurrent seizure activity and given a prescription for Depakote ER and discharged in a good Rx coupon.  He had a recurrent tonic-clonic seizure in the parking lot that brought him back to the ER.  He denied any tongue bites.  He was fairly somnolent during my interview after being given IV Ativan and Keppra but was arousable.  He denied any headache or dizziness or blurred vision.  No paresthesias or focal muscle weakness.  He stated that he has not taken his Depakote for 6 months and Lamictal for 2 weeks.  No chest pain or dyspnea or cough or wheezing.  No nausea or vomiting or abdominal pain.  No fever or chills.  Upon presentation to the emergency room, blood pressure was 141/88 with otherwise normal vital signs.  Earlier it was 128/89.  Labs reviewed potassium of 3.6 with otherwise normal BMP and CBC was within normal.  Noncontrasted head CT scan revealed no acute intracranial abnormalities.EKG showed sinus tachycardia with a rate of 115 with probable left atrial enlargement.  The patient was given 1 mg of IV lorazepam and was loaded with 1 g of IV Keppra.  He will be admitted to an observation PCU bed for further evaluation and management.  [facility] were consulted to admit the patient for further evaluation and treatment. The patient had been taking depakote for many years according to his sister. She stated that she did not think that it worked well. She understood that when the patient presented to [facility] with a seizure a while ago that was why they had  loaded the patient with keppra and started him on that. According to the sister the pattern is that the patient will be seen at a hospital ED after a seizure. He will be set up with one month of meds, and then he will stop taking his meds when he runs out. He will not act in his own self interest to maintain his health. They have looked into getting medicaid for him, but the patient is not cooperative with the process.  Psychiatry has been consulted to evaluate the patient's depression. Transitions of Care has been consulted to help set the patient up with outpatient mental health clinic and to help the patient to set up an ongoing source of antiepileptics.  On the morning of  the patient is complaining of dysarthria and difficulty walking. Per PT who is in the room, the patient is ataxic. I observe him trying to get back in bed. He is very unsteady on his feet. CT and MRI performed and were negative for acute pathology.  Later on 03/27/2020, after he had been seen and cleared by psychiatry, he required a sitter, because he was found by nursing standing in the middle of his bed "trying to fly like superman". Psychiatry asked to return to re-evaluate the patient. [name] came to re-evaluate the patient and put him on low dose antipsychotics and anxiolytics.   Consultants   Neurology  Psychiatry  Procedures   None  Antibiotics   Anti-infectives (From  admission, onward)   None     Subjective  The patient is awake and alert this morning. There are no new complaints.  Objective   Vitals:  Vitals:   03/29/20 1129 03/29/20 1321  BP: (!) 149/99 96/60  Pulse: 90 (!) 115  Resp: 17 18  Temp: 98.5 F (36.9 C) 98.7 F (37.1 C)  SpO2: 99% 91%    Exam:  Constitutional:  The patient is awake, alert, and oriented x 3. No acute distress. Respiratory:  No increased work of breathing. No wheezes, rales, or rhonchi No tactile fremitus Cardiovascular:  Regular rate and rhythm No murmurs,  ectopy, or gallups. No lateral PMI. No thrills. Abdomen:  Abdomen is soft, non-tender, non-distended No hernias, masses, or organomegaly Normoactive bowel sounds.  Musculoskeletal:  No cyanosis, clubbing, or edema Skin:  No rashes, lesions, ulcers palpation of skin: no induration or nodules Neurologic:  CN 2-12 intact Sensation all 4 extremities intact Gait ataxic Speech dysarthric Psychiatric:  Mental status Mood, affect congruent Orientation to person, place, time  judgment and insight are questionable. I have personally reviewed the following:   Today's Data   Vitals, BMP, CBC  Imaging   CT head  MRI brain  Cardiology Data   EKG  Scheduled Meds:  benztropine  0.5 mg Oral BID   clonazePAM  0.5 mg Oral TID   diphenhydrAMINE       DULoxetine  20 mg Oral Daily   enoxaparin (LOVENOX) injection  40 mg Subcutaneous Q24H   haloperidol  0.5 mg Oral TID   lamoTRIgine  150 mg Oral BID   thiamine injection  100 mg Intravenous Daily   Continuous Infusions:  levETIRAcetam 1,000 mg (03/29/20 0629)    Active Problems:   Recurrent seizures (HCC)   Seizure (HCC)   LOS: 4 days   A & P  Breakthrough seizures likely secondary to medical noncompliance with subsequent fall and left temporoparietal contusion and abrasions: According to the patient's sister he has no way to support himself. His family owns the house he lives in. He goes to ED's with seizures when he runs out of meds and gets another months worth of medication. Will consult TOC to see what we can do. Dr. Thad Ranger assistance is greatly appreciated. The patient will be continued on keppra as it seems that he was not doing well on the depakote anyway. The patient's sensorium is much improved today. TOC has been consulted to help the patient with ongoing source of his antiepileptic medications.  Dysarthria and ataxia: New neurological changes per patient. CT brain and MRI negative for acute pathology. Will  check thiamine and start IV thiamine. CT head was negative on admission. DDx:  Wernickes vs effects of new neuroleptics, although the patient has been on keppra in the past and did not have any difficulty with it. The patient will likely require SNF placement.  Likely depression: Psychiatry has evaluated and now re-evaluated the patient. He has been started on antipsychotics and anxiolytics. Seems calm and reasonable this morning.  Hypertension: Likely due to seizures. Resolved now. Will continue to monitor.  I have seen and examined this patient myself. I have spent 32 minutes in his evaluation and care.  DVT prophylaxis. Lovenox. CODE STATUS: Full Code Family Communication: None available. Disposition: The patient is from home. Anticipate discharge to home vs SNF given his difficulties with ambulation. Barriers to discharge: New neurological changes. Complete work up and determine appropriate treatment going forward..  Status is: Inpatient  Remains inpatient  appropriate because:Unsafe d/c plan   Dispo: The patient is from: Home              Anticipated d/c is to: SNF              Anticipated d/c date is: 1 day              Patient currently is not medically stable to d/c. Jlee Harkless, DO [facility] Direct contact: see [email or web address]  7PM-7AM contact night coverage as above 03/29/2020, 2:42 PM  LOS: 0 days

## 2020-03-29 NOTE — Progress Notes (Signed)
I was called to the patient's room regarding a security alert. The patient had tried to leave and had become combative. He was treated with 1 mg ativan and 5 mg haldol IM. The patient was requiring physical restraint by nine healthcare workers and Catering manager. He claimed that he was being taken away for a "treatment" which was not the case. His sister arrived and attempted to talk to the patient and get him to calm down, but she was unsuccessful. The patient was very combative and violent with personnel swinging and kicking at them. In my opinion, if this patient is allowed to leave the premises in his current state he is very likely to come to harm due to his psychotic and delusional state. He is also likely to cause harm to others. I discussed the patient with the case manager [name] who said that I could not IVC the patient unless he said that he was going to kill himself, although he is clearly psychotic. She stated that only [name] could IVC the patient. [name] was called to the floor. I discussed the patient with him. He stated that he would IVC the patient "if need be".

## 2020-03-29 NOTE — Progress Notes (Signed)
PT Cancellation Note  Patient Details Name: Lance Ray MRN: [id number] DOB: [birth year]   Cancelled Treatment:    Reason Eval/Treat Not Completed: Patient's level of consciousness (Pt agitated this date, delusional, fighting staff. Will hold PT session until pt better able to participate. Will attempt again at later date/time.)  1:19 PM, 03/29/20 Rosamaria Lints, PT, DPT Physical Therapist - [facility]  [phone] (ASCOM)    [name] 03/29/2020, 1:18 PM

## 2020-03-29 NOTE — Progress Notes (Signed)
°   03/29/20 1321  Assess: MEWS Score  Temp 98.7 F (37.1 C)  BP 96/60  Pulse Rate (!) 115  Resp 18  SpO2 91 %  O2 Device Room Air  Assess: MEWS Score  MEWS Temp 0  MEWS Systolic 1  MEWS Pulse 2  MEWS RR 0  MEWS LOC 0  MEWS Score 3  MEWS Score Color Yellow  Assess: if the MEWS score is Yellow or Red  Were vital signs taken at a resting state? Yes  Focused Assessment Documented focused assessment  Early Detection of Sepsis Score *See Row Information* Low  MEWS guidelines implemented *See Row Information* Yes  Treat  MEWS Interventions Other (Comment) (meds already given prior to vital sigsn )  Take Vital Signs  Increase Vital Sign Frequency  Yellow: Q 2hr X 2 then Q 4hr X 2, if remains yellow, continue Q 4hrs  Escalate  MEWS: Escalate Yellow: discuss with charge nurse/RN and consider discussing with provider and RRT  Notify: Charge Nurse/RN  Name of Charge Nurse/RN Notified Belva Agee   Date Charge Nurse/RN Notified 03/29/20  Time Charge Nurse/RN Notified 1322  Notify: Provider  Provider Name/Title Dr Gerri Lins   Date Provider Notified 03/29/20  Time Provider Notified  (MD already aware of agiatiation that caused increased pulse )  Notification Type Face-to-face  Notification Reason Other (Comment) (no new orders )  Response No new orders  Date of Provider Response 03/29/20  Document  Patient Outcome Stabilized after interventions  Progress note created (see row info) Yes

## 2020-03-29 NOTE — Progress Notes (Signed)
OT Cancellation Note  Patient Details Name: Lance Ray MRN: [id number] DOB: May 23, 1971   Cancelled Treatment:    Reason Eval/Treat Not Completed: Other (comment). Per [facility], Pt agitated this date, delusional, fighting staff. Will hold OT tx session until pt better able to participate. Will follow up at later date/time as able.   Kathie Dike, M.S. OTR/L  03/29/20, 1:24 PM

## 2020-03-30 DIAGNOSIS — R000 Tachycardia, unspecified: Secondary | ICD-10-CM

## 2020-03-30 MED ORDER — OLANZAPINE 10 MG IM SOLR
10.0000 mg | Freq: Once | INTRAMUSCULAR | Status: AC
  Administered 2020-03-30: 10 mg via INTRAMUSCULAR
  Filled 2020-03-30: qty 10

## 2020-03-30 MED ORDER — HALOPERIDOL LACTATE 5 MG/ML IJ SOLN
INTRAMUSCULAR | Status: AC
  Administered 2020-03-30: 5 mg
  Filled 2020-03-30: qty 1

## 2020-03-30 MED ORDER — DIPHENHYDRAMINE HCL 50 MG/ML IJ SOLN
50.0000 mg | Freq: Once | INTRAMUSCULAR | Status: AC
  Administered 2020-03-30: 50 mg via INTRAMUSCULAR

## 2020-03-30 MED ORDER — LORAZEPAM 2 MG/ML IJ SOLN
2.0000 mg | INTRAMUSCULAR | Status: AC
  Administered 2020-03-30: 2 mg via INTRAVENOUS

## 2020-03-30 MED ORDER — DEXMEDETOMIDINE HCL IN NACL 200 MCG/50ML IV SOLN
0.4000 ug/kg/h | INTRAVENOUS | Status: DC
  Administered 2020-03-30 – 2020-03-31 (×2): 0.4 ug/kg/h via INTRAVENOUS

## 2020-03-30 MED ORDER — LORAZEPAM 2 MG/ML IJ SOLN
INTRAMUSCULAR | Status: AC
  Filled 2020-03-30: qty 1

## 2020-03-30 MED ORDER — DIPHENHYDRAMINE HCL 50 MG PO CAPS
50.0000 mg | ORAL_CAPSULE | Freq: Once | ORAL | Status: AC
  Filled 2020-03-30: qty 1

## 2020-03-30 MED ORDER — METOPROLOL TARTRATE 5 MG/5ML IV SOLN
INTRAVENOUS | Status: AC
  Filled 2020-03-30: qty 5

## 2020-03-30 MED ORDER — HALOPERIDOL LACTATE 5 MG/ML IJ SOLN
INTRAMUSCULAR | Status: AC
  Filled 2020-03-30: qty 1

## 2020-03-30 MED ORDER — HALOPERIDOL LACTATE 5 MG/ML IJ SOLN
5.0000 mg | Freq: Four times a day (QID) | INTRAMUSCULAR | Status: DC | PRN
  Administered 2020-03-30 – 2020-03-31 (×2): 5 mg via INTRAMUSCULAR
  Filled 2020-03-30 (×2): qty 1

## 2020-03-30 MED ORDER — DEXMEDETOMIDINE BOLUS VIA INFUSION
1.0000 ug/kg | Freq: Once | INTRAVENOUS | Status: DC
  Filled 2020-03-30: qty 82

## 2020-03-30 MED ORDER — METOPROLOL TARTRATE 5 MG/5ML IV SOLN
5.0000 mg | Freq: Once | INTRAVENOUS | Status: AC
  Administered 2020-03-30: 5 mg via INTRAVENOUS

## 2020-03-30 MED ORDER — BENZTROPINE MESYLATE 1 MG PO TABS
1.0000 mg | ORAL_TABLET | Freq: Two times a day (BID) | ORAL | Status: DC
  Administered 2020-03-30 – 2020-04-09 (×19): 1 mg via ORAL
  Filled 2020-03-30 (×21): qty 1

## 2020-03-30 MED ORDER — HALOPERIDOL LACTATE 5 MG/ML IJ SOLN
5.0000 mg | Freq: Once | INTRAMUSCULAR | Status: AC
  Administered 2020-03-30: 5 mg via INTRAVENOUS

## 2020-03-30 MED ORDER — OLANZAPINE 5 MG PO TBDP
15.0000 mg | ORAL_TABLET | Freq: Every day | ORAL | Status: DC
  Administered 2020-03-30 – 2020-03-31 (×2): 15 mg via ORAL
  Filled 2020-03-30 (×3): qty 1

## 2020-03-30 MED ORDER — CHLORHEXIDINE GLUCONATE CLOTH 2 % EX PADS
6.0000 | MEDICATED_PAD | Freq: Every day | CUTANEOUS | Status: DC
  Administered 2020-03-30 – 2020-04-01 (×4): 6 via TOPICAL

## 2020-03-30 MED ORDER — HALOPERIDOL 2 MG PO TABS
2.0000 mg | ORAL_TABLET | Freq: Four times a day (QID) | ORAL | Status: DC | PRN
  Filled 2020-03-30: qty 1

## 2020-03-30 NOTE — Progress Notes (Signed)
PROGRESS NOTE  HENDRIX YURKOVICH ESP:[id number] DOB: Jul 13, 1971 DOA: 03/24/2020 PCP: Patient, No Pcp Per  Brief History   Dakarri Kessinger  is a 49 y.o. Caucasian male with a known history of seizure disorder, who has not been on his Depakote for months and presented to the emergency room with acute onset of witnessed generalized tonic-clonic seizure at [facility].  He was seen earlier in the ER, observed in the ER for 5 hours without recurrent seizure activity and given a prescription for Depakote ER and discharged in a good Rx coupon.  He had a recurrent tonic-clonic seizure in the parking lot that brought him back to the ER.  He denied any tongue bites.  He was fairly somnolent during my interview after being given IV Ativan and Keppra but was arousable.  He denied any headache or dizziness or blurred vision.  No paresthesias or focal muscle weakness.  He stated that he has not taken his Depakote for 6 months and Lamictal for 2 weeks.  No chest pain or dyspnea or cough or wheezing.  No nausea or vomiting or abdominal pain.  No fever or chills.  Upon presentation to the emergency room, blood pressure was 141/88 with otherwise normal vital signs.  Earlier it was 128/89.  Labs reviewed potassium of 3.6 with otherwise normal BMP and CBC was within normal.  Noncontrasted head CT scan revealed no acute intracranial abnormalities.EKG showed sinus tachycardia with a rate of 115 with probable left atrial enlargement.  The patient was given 1 mg of IV lorazepam and was loaded with 1 g of IV Keppra.  He will be admitted to an observation PCU bed for further evaluation and management.  [facility] were consulted to admit the patient for further evaluation and treatment. The patient had been taking depakote for many years according to his sister. She stated that she did not think that it worked well. She understood that when the patient presented to [facility] with a seizure a while ago that was why they had  loaded the patient with keppra and started him on that. According to the sister the pattern is that the patient will be seen at a hospital ED after a seizure. He will be set up with one month of meds, and then he will stop taking his meds when he runs out. He will not act in his own self interest to maintain his health. They have looked into getting medicaid for him, but the patient is not cooperative with the process.  Psychiatry has been consulted to evaluate the patient's depression. Transitions of Care has been consulted to help set the patient up with outpatient mental health clinic and to help the patient to set up an ongoing source of antiepileptics.  On the morning of  the patient is complaining of dysarthria and difficulty walking. Per PT who is in the room, the patient is ataxic. I observe him trying to get back in bed. He is very unsteady on his feet. CT and MRI performed and were negative for acute pathology.  Later on 03/27/2020, after he had been seen and cleared by psychiatry, he required a sitter, because he was found by nursing standing in the middle of his bed "trying to fly like superman". Psychiatry asked to return to re-evaluate the patient. [name] came to re-evaluate the patient and put him on low dose antipsychotics and anxiolytics.   On 03/29/2020 I was called to the patient's room regarding a security alert. The patient had tried  to leave and had become combative. He was treated with 1 mg ativan and 5 mg haldol IM. The patient was requiring physical restraint by nine healthcare workers and Catering manager. He claimed that he was being taken away for a "treatment" which was not the case. His sister arrived and attempted to talk to the patient and get him to calm down, but she was unsuccessful. The patient was very combative and violent with personnel swinging and kicking at them. In my opinion, if this patient is allowed to leave the premises in his current state he is very likely to  come to harm due to his psychotic and delusional state. He is also likely to cause harm to others. I discussed the patient with the case manager [name] who said that I could not IVC the patient unless he said that he was going to kill himself, although he is clearly psychotic. She stated that only [name] could IVC the patient. [name] was called to the floor. I discussed the patient with him. He did IVC the patient pending stabilization.  The patient was transferred to the ICU last night after another outburst requiring a security alert. He is now on a precedex drip. The patient's sisters are working to obtain guardianship of the patient. Although it took precedex ultimately to calm the patient down and address his tachycardia and hypertension, I had not considered ETOH withdrawal due to the patient given history that he drank 2 beers and to shots of hard liquor a week. This may not have been accurate.  Consultants  . Neurology . Psychiatry  Procedures  . None  Antibiotics   Anti-infectives (From admission, onward)   None     Subjective  The patient is sedated on precedex this morning. Sleeping soundly.  Objective   Vitals:  Vitals:   03/30/20 0800 03/30/20 1200  BP:    Pulse:    Resp:    Temp: 99.1 F (37.3 C) 98.7 F (37.1 C)  SpO2:      Exam:  Constitutional:  The patient is sleeping soundly. No acute distress. Respiratory:  No increased work of breathing. No wheezes, rales, or rhonchi No tactile fremitus Cardiovascular:  Regular rate and rhythm No murmurs, ectopy, or gallups. No lateral PMI. No thrills. Abdomen:  Abdomen is soft, non-tender, non-distended No hernias, masses, or organomegaly Normoactive bowel sounds.  Musculoskeletal:  No cyanosis, clubbing, or edema Skin:  No rashes, lesions, ulcers palpation of skin: no induration or nodules Neurologic:  The patient is unable to cooperate with exam. Psychiatric:   The patient is unable to cooperate  with exam.  I have personally reviewed the following:   Today's Data  . Vitals, BMP  Imaging  . CT head . MRI brain  Cardiology Data  . EKG  Scheduled Meds: . benztropine  1 mg Oral BID  . Chlorhexidine Gluconate Cloth  6 each Topical Q0600  . clonazePAM  0.5 mg Oral TID  . dexmedetomidine  1 mcg/kg Intravenous Once  . DULoxetine  20 mg Oral Daily  . enoxaparin (LOVENOX) injection  40 mg Subcutaneous Q24H  . haloperidol  0.5 mg Oral TID  . lamoTRIgine  150 mg Oral BID  . OLANZapine zydis  15 mg Oral QHS  . thiamine injection  100 mg Intravenous Daily   Continuous Infusions: . sodium chloride Stopped (03/29/20 2141)  . dexmedetomidine (PRECEDEX) IV infusion Stopped (03/30/20 0705)  . levETIRAcetam 1,000 mg (03/30/20 0721)    Active Problems:  Recurrent seizures (HCC)   Seizure (HCC)   LOS: 5 days   A & P  Breakthrough seizures likely secondary to medical noncompliance with subsequent fall and left temporoparietal contusion and abrasions: According to the patient's sister he has no way to support himself. His family owns the house he lives in. He goes to ED's with seizures when he runs out of meds and gets another months worth of medication. Will consult TOC to see what we can do. Dr. Thad Ranger assistance is greatly appreciated. The patient will be continued on keppra as it seems that he was not doing well on the depakote anyway. The patient's sensorium is much improved today. TOC has been consulted to help the patient with ongoing source of his antiepileptic medications.  Dysarthria and ataxia: New neurological changes per patient. CT brain and MRI negative for acute pathology. Will check thiamine and start IV thiamine. CT head was negative on admission. DDx:  Wernickes vs effects of new neuroleptics, although the patient has been on keppra in the past and did not have any difficulty with it. The patient will likely require SNF placement.  ETOH Withdrawal: Contrary to  history given by patient and sisters. But precedex alone was able to resolve the patient's delusional and combative behavior, tachycardia, and hypertension.  Likely depression: Psychiatry has evaluated and now re-evaluated the patient. He has been started on antipsychotics and anxiolytics. Seems calm and reasonable this morning.  Hypertension: Likely due to seizures. Resolved now. Will continue to monitor.  I have seen and examined this patient myself. I have spent 34 minutes in his evaluation and care.  DVT prophylaxis. Lovenox. CODE STATUS: Full Code Family Communication: None available. Disposition: The patient is from home. Anticipate discharge to home vs SNF given his difficulties with ambulation. Barriers to discharge: New neurological changes. Complete work up and determine appropriate treatment going forward..  Status is: Inpatient  Remains inpatient appropriate because:Unsafe d/c plan   Dispo: The patient is from: Home              Anticipated d/c is to: SNF              Anticipated d/c date is: tbd              Patient currently is not medically stable to d/c. Brooklynn Brandenburg, DO [facility] Direct contact: see [email or web address]  7PM-7AM contact night coverage as above 03/30/2020, 4:56 PM  LOS: 0 days

## 2020-03-30 NOTE — Progress Notes (Signed)
0300: patient became vocally and physically aggressive , attempting to get oob.  Code 300  Called. All extremities held down.  Lance Ray notified, at bedside . Medication given with minimal change in patient behavior. Restraints ordered and placed. Placed. Increased HR noted at [address] [place] notified medication given.  Patient transferred too ICU.

## 2020-03-30 NOTE — Consult Note (Signed)
MEDICATION RELATED CONSULT NOTE   Pharmacy Consult for serotonin syndrome probability/DDI evaluation  Indication: violent episodes, tachycardia, diaphoresis  No Known Allergies  Patient Measurements: Height: 5\' 9"  (175.3 cm) Weight: 81.6 kg (180 lb) IBW/kg (Calculated) : 70.7  Vital Signs: Temp: 99.1 F (37.3 C) (07/06 0800) Temp Source: Oral (07/06 0800) BP: 93/64 (07/06 0620) Pulse Rate: 76 (07/06 0620) Intake/Output from previous day: 07/05 0701 - 07/06 0700 In: 316.1 [I.V.:16.1; IV Piggyback:300] Out: 700 [Urine:700] Intake/Output from this shift: No intake/output data recorded.  Labs: Recent Labs    03/28/20 0605 03/29/20 0719  WBC 4.2  --   HGB 11.1*  --   HCT 32.6*  --   PLT 213  --   CREATININE 0.75 1.05   Estimated Creatinine Clearance: 85.1 mL/min (by C-G formula based on SCr of 1.05 mg/dL).   Microbiology: Recent Results (from the past 720 hour(s))  SARS Coronavirus 2 by RT PCR (hospital order, performed in [facility] hospital lab) Nasopharyngeal Nasopharyngeal Swab     Status: None   Collection Time: 03/25/20  4:13 PM   Specimen: Nasopharyngeal Swab  Result Value Ref Range Status   SARS Coronavirus 2 NEGATIVE NEGATIVE Final    Comment: (NOTE) SARS-CoV-2 target nucleic acids are NOT DETECTED.  The SARS-CoV-2 RNA is generally detectable in upper and lower respiratory specimens during the acute phase of infection. The lowest concentration of SARS-CoV-2 viral copies this assay can detect is 250 copies / mL. A negative result does not preclude SARS-CoV-2 infection and should not be used as the sole basis for treatment or other patient management decisions.  A negative result may occur with improper specimen collection / handling, submission of specimen other than nasopharyngeal swab, presence of viral mutation(s) within the areas targeted by this assay, and inadequate number of viral copies (<250 copies / mL). A negative result must be combined with  clinical observations, patient history, and epidemiological information.  Fact Sheet for Patients:   05/26/20  Fact Sheet for Healthcare Providers: BoilerBrush.com.cy  This test is not yet approved or  cleared by the https://pope.com/ FDA and has been authorized for detection and/or diagnosis of SARS-CoV-2 by FDA under an Emergency Use Authorization (EUA).  This EUA will remain in effect (meaning this test can be used) for the duration of the COVID-19 declaration under Section 564(b)(1) of the Act, 21 U.S.C. section 360bbb-3(b)(1), unless the authorization is terminated or revoked sooner.  Performed at [facility], [address] [place], [place]     Medications:  Scheduled:  . benztropine  1 mg Oral BID  . Chlorhexidine Gluconate Cloth  6 each Topical Q0600  . clonazePAM  0.5 mg Oral TID  . dexmedetomidine  1 mcg/kg Intravenous Once  . DULoxetine  20 mg Oral Daily  . enoxaparin (LOVENOX) injection  40 mg Subcutaneous Q24H  . haloperidol  0.5 mg Oral TID  . lamoTRIgine  150 mg Oral BID  . OLANZapine zydis  15 mg Oral QHS  . thiamine injection  100 mg Intravenous Daily    Assessment: 49 y.o.Caucasian malewith a known history of seizure disorder, who has not been on his Depakote for months and presented to the emergency room with acute onset of witnessedgeneralized tonic-clonicseizure.   serotonin syndrome probability/DDI evaluation:   DDIs: duloxetine + trazodone (increased likelihood of seratonin syndrome)  Trazodone discontinued  No other DDIs detected  Pharmacy will continue to evaluate for new DDIs  54 03/30/2020,9:30 AM

## 2020-03-30 NOTE — Progress Notes (Signed)
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 Seizures (HCC)     Past Surgical History:  Procedure Laterality Date  . SKIN GRAFT     Family History:  Family History  Problem Relation Age of Onset  . Seizures Mother    Family Psychiatric  History:  Social History:  Social History   Substance and Sexual Activity  Alcohol Use Yes  . Alcohol/week: 2.0 standard drinks  . Types: 2 Shots of liquor per week   Comment: "rarely"      Social History   Substance and Sexual Activity  Drug Use No    Social History   Socioeconomic History  . Marital status: Single    Spouse name: Not on file  . Number of children: Not on file  . Years of education: Not on file  . Highest education level: Not on file  Occupational History  . Not on file  Tobacco Use  . Smoking status: Current Some Day Smoker  . Smokeless tobacco: Current User  Vaping Use  . Vaping Use: Never used  Substance and Sexual Activity  . Alcohol use: Yes    Alcohol/week: 2.0 standard drinks    Types: 2 Shots of liquor per week    Comment: "rarely"   . Drug use: No  . Sexual activity: Yes  Other Topics Concern  . Not on file  Social History Narrative  . Not on file   Social Determinants of Health   Financial Resource Strain:   . Difficulty of Paying Living Expenses:   Food Insecurity:   . Worried About Programme researcher, broadcasting/film/video in the Last Year:   . Barista in the Last Year:   Transportation Needs:   . Freight forwarder  (Medical):   [name] Lack of Transportation (Non-Medical):   Physical Activity:   . Days of Exercise per Week:   . Minutes of Exercise per Session:   Stress:   . Feeling of Stress :   Social Connections:   . Frequency of Communication with Friends and Family:   . Frequency of Social Gatherings with Friends and Family:   . Attends Religious Services:   . Active Member of Clubs or Organizations:   . Attends Banker Meetings:   [name] Marital Status:    Additional Social History:       Continues to have issues.  Had been off Precedex but had been refusing oral psych meds.  Remains in mittens and ICU restrant 1;1---Seizures under control with three meds but behaviors not yet.  Will have to wait to see if he will comply with oral meds.  Nursing did say he calms down with some redirection as well.   Otherwise may have to change to IV if he acts out or has outbursts this pm                     Sleep:  Had been sedated now awake   Appetite:  Fair   Current Medications: Current Facility-Administered Medications  Medication Dose Route Frequency Provider Last Rate Last Admin  . 0.9 %  sodium chloride infusion   Intravenous PRN Swayze, Ava, DO   Stopped at 03/29/20 2141  . acetaminophen (TYLENOL) tablet 650 mg  650 mg Oral Q6H PRN Mansy, Jan A, MD       Or  . acetaminophen (TYLENOL) suppository 650 mg  650 mg Rectal Q6H PRN Mansy, Jan A, MD      . benztropine (COGENTIN) tablet 1 mg  1 mg Oral BID [name], MD      . Chlorhexidine Gluconate Cloth 2 % PADS 6 each  6 each Topical Q0600 Manuela Schwartz, NP   6 each at 03/30/20 0551  . clonazePAM (KLONOPIN) disintegrating tablet 0.5 mg  0.5 mg Oral TID [name], MD      . dexmedetomidine (PRECEDEX) 200 MCG/50ML (4 mcg/mL) infusion  0.4-1.2 mcg/kg/hr Intravenous Titrated Manuela Schwartz, NP   Paused at 03/30/20 [phone]  . dexmedetomidine (PRECEDEX) bolus via infusion 81.6 mcg  1 mcg/kg Intravenous Once Manuela Schwartz,  NP      . diphenhydrAMINE (BENADRYL) capsule 50 mg  50 mg Oral Q6H PRN [name], MD       Or  . diphenhydrAMINE (BENADRYL) injection 50 mg  50 mg Intramuscular Q6H PRN [name], MD   50 mg at 03/29/20 1240  . DULoxetine (CYMBALTA) DR capsule 20 mg  20 mg Oral Daily [name], MD      . enoxaparin (LOVENOX) injection 40 mg  40 mg Subcutaneous Q24H Mansy, Jan A, MD   40 mg at 03/29/20 2156  . haloperidol (HALDOL) tablet 0.5 mg  0.5 mg Oral TID [name], MD      . haloperidol lactate (HALDOL) injection 5 mg  5 mg Intramuscular Q6H PRN [name], MD       Or  . haloperidol (HALDOL) tablet 2 mg  2 mg Oral Q6H PRN [name], MD      . hydrALAZINE (APRESOLINE) tablet 50 mg  50 mg Oral Q6H PRN Mansy, Jan A, MD      . lamoTRIgine (LAMICTAL) tablet 150 mg  150 mg Oral BID Thana Farr, MD   150 mg at 03/29/20 0859  . levETIRAcetam (KEPPRA) IVPB 1000 mg/100 mL premix  1,000 mg Intravenous Q12H Thana Farr, MD 400 mL/hr at 03/30/20 0721 1,000 mg at 03/30/20 0721  . LORazepam (ATIVAN) injection 1 mg  1 mg Intravenous Q1H PRN Mansy, Jan A, MD   1 mg at 03/29/20 1204  . magnesium hydroxide (MILK OF MAGNESIA) suspension 30 mL  30 mL Oral Daily PRN Mansy, Jan A, MD      . OLANZapine (ZYPREXA) injection 10 mg  10 mg Intramuscular Q6H PRN [name], MD   10 mg at 03/30/20 0322  . OLANZapine zydis (ZYPREXA) disintegrating tablet 15 mg  15 mg Oral QHS [name], MD      . ondansetron [facility]) tablet 4 mg  4 mg Oral Q6H PRN Mansy, Jan A, MD       Or  . ondansetron [facility]) injection 4 mg  4 mg Intravenous Q6H PRN Mansy, Jan A, MD      . thiamine (B-1) injection 100 mg  100 mg Intravenous Daily Swayze, Ava, DO   100 mg at 03/30/20 1045    Lab Results:  Results for orders placed or performed during the hospital encounter of 03/24/20 (from the past 48 hour(s))  Basic metabolic  panel     Status: Abnormal   Collection Time: 03/29/20  7:19 AM  Result  Value Ref Range   Sodium 141 135 - 145 mmol/L   Potassium 4.2 3.5 - 5.1 mmol/L   Chloride 107 98 - 111 mmol/L   CO2 26 22 - 32 mmol/L   Glucose, Bld 81 70 - 99 mg/dL    Comment: Glucose reference range applies only to samples taken after fasting for at least 8 hours.   BUN 8 6 - 20 mg/dL   Creatinine, Ser 2.67 0.61 - 1.24 mg/dL   Calcium 8.8 (L) 8.9 - 10.3 mg/dL   GFR calc non Af Amer >60 >60 mL/min   GFR calc Af Amer >60 >60 mL/min   Anion gap 8 5 - 15    Comment: Performed at [facility], [address] [place], [place] [address]    Blood Alcohol level:  Lab Results  Component Value Date   ETH <10 11/03/2019   ETH <5 12/05/2016    Metabolic Disorder Labs: No results found for: HGBA1C, MPG No results found for: PROLACTIN No results found for: CHOL, TRIG, HDL, CHOLHDL, VLDL, LDLCALC  Physical Findings: AIMS:  , ,  ,  ,    CIWA:    COWS:     Musculoskeletal: Strength & Muscle Tone:  Remains in ICU   Gait & Station:  Patient leans:   Psychiatric Specialty Exam: Physical Exam  Review of Systems  Blood pressure 93/64, pulse 76, temperature 98.7 F (37.1 C), temperature source Axillary, resp. rate 13, height 5\' 9"  (1.753 m), weight 81.6 kg, SpO2 98 %.Body mass index is 26.58 kg/m.  Brief MS  He is currently awake alert answers questions Consciousnes not clouded or fluctuant Speech not slurred normal Mood somewhat edgy frustrated Affect somewhat flat No new SI or HI He does not provide rational answers for not cooperating with oral medications.   Tried to make supportive statements in general   Somewhat restless agitated in restraints and mittens Looks somewhat haggard, sickly  withdrawal not observed including shakes and tremors despite being restless                                                         Treatment Plan Summary:  continues current plan, but some meds may have to still be converted  To IV meds To  avoid precedex as well.    Homero Fellers, MD 03/30/2020, 3:42 PM

## 2020-03-30 NOTE — Significant Event (Signed)
Rapid Response Event Note  Overview: Time Called: 0245 Arrival Time: 0248 Event Type: Other (Comment) (Violence, Aggression)  Initial Focused Assessment: RR called for patient with history of psychosis, presently acting out violently, multiple staff at bedside; pt in manual hold.  Interventions: Given IV Haldol 5mg  x2 per NP, given PRN benedryl and zyprexa previously ordered.  Violent restraints placed per NP orders, transferred to ICU 5 for precedex drip and closer monitoring.  Plan of Care (if not transferred):  Event Summary: Name of Physician Notified: , NP at 0300    at    Outcome: Transferred (Comment)  Event End Time: 0345  Manuela Schwartz

## 2020-03-30 NOTE — Progress Notes (Signed)
Cross Cover Brief Note Patient heart rate and behavior improved with precedex. Violent restraint off and nonviolent restraints in place. Nursing asked to wean precedex

## 2020-03-30 NOTE — Progress Notes (Signed)
This RN was alerted by sitter that patient removed IV access from his right upper bicep, despite mittens in place.  Patient is disoriented and combative, and this RN administered IM Haldol and Benadryl as ordered and paged MD Swayze.  She immediately returned my page and reinitiated orders for restraints for patient's violent manner, as well as IM Zyprexa (awaiting from pharmacy).  Patient is restless in bed, with sitter at bedside.  Will continue to monitor.

## 2020-03-30 NOTE — Progress Notes (Signed)
OT Cancellation Note  Patient Details Name: Lance Ray MRN: [id number] DOB: 02/13/1971   Cancelled Treatment:    Reason Eval/Treat Not Completed: Other (comment). Chart reviewed. Pt transferred to ICU this morning. Per protocol, will complete current OT consult order. Please re-consult once pt is medically appropriate for participation in therapy.   Richrd Prime, MPH, MS, OTR/L ascom [phone] 03/30/20, 7:53 AM

## 2020-03-30 NOTE — Progress Notes (Signed)
Chaplain responded to Rapid Response Code. Patient was combative and had to be restrained. Chaplain offered ministry of presence prayer for patient.

## 2020-03-30 NOTE — Progress Notes (Addendum)
Cross Cover Brief Note Patient became combative, agitated , violent and hallucinating requiring multiple staff members to restrain in order to keep himself and staff safe.. Initially medicated with IV haldol without any improvement.  He was placed in 4 point violent restraints  He was given additional haldol, zyprexa, and ativan.   Patient heart rate in 160's with agitation.  He was given 5 mg metoprolol improving rate to 120's/ Patient was transferred to ICU per protocol.  Pharmacy consult to review meds to assess for serotonin syndrome probablility

## 2020-03-30 NOTE — Progress Notes (Signed)
PT Cancellation Note  Patient Details Name: Lance Ray MRN: [id number] DOB: 1970/11/15   Cancelled Treatment:    Reason Eval/Treat Not Completed: Other (comment). Pt very aggressive with restraints in placed. Also on precedex. Due to change in status, will need new PT orders to resume care. Will dc current orders at this time. Please re-order when able to participate in PT.   Jennaya Pogue 03/30/2020, 9:51 AM Elizabeth Palau, PT, DPT [phone]

## 2020-03-31 DIAGNOSIS — E876 Hypokalemia: Secondary | ICD-10-CM

## 2020-03-31 LAB — CBC WITH DIFFERENTIAL/PLATELET
Abs Immature Granulocytes: 0.01 10*3/uL (ref 0.00–0.07)
Basophils Absolute: 0 10*3/uL (ref 0.0–0.1)
Basophils Relative: 1 %
Eosinophils Absolute: 0.1 10*3/uL (ref 0.0–0.5)
Eosinophils Relative: 2 %
HCT: 33.9 % — ABNORMAL LOW (ref 39.0–52.0)
Hemoglobin: 11.6 g/dL — ABNORMAL LOW (ref 13.0–17.0)
Immature Granulocytes: 0 %
Lymphocytes Relative: 22 %
Lymphs Abs: 1 10*3/uL (ref 0.7–4.0)
MCH: 30.1 pg (ref 26.0–34.0)
MCHC: 34.2 g/dL (ref 30.0–36.0)
MCV: 88.1 fL (ref 80.0–100.0)
Monocytes Absolute: 0.5 10*3/uL (ref 0.1–1.0)
Monocytes Relative: 10 %
Neutro Abs: 3.1 10*3/uL (ref 1.7–7.7)
Neutrophils Relative %: 65 %
Platelets: 248 10*3/uL (ref 150–400)
RBC: 3.85 MIL/uL — ABNORMAL LOW (ref 4.22–5.81)
RDW: 14.7 % (ref 11.5–15.5)
WBC: 4.8 10*3/uL (ref 4.0–10.5)
nRBC: 0 % (ref 0.0–0.2)

## 2020-03-31 LAB — COMPREHENSIVE METABOLIC PANEL
ALT: 16 U/L (ref 0–44)
AST: 56 U/L — ABNORMAL HIGH (ref 15–41)
Albumin: 3.5 g/dL (ref 3.5–5.0)
Alkaline Phosphatase: 69 U/L (ref 38–126)
Anion gap: 11 (ref 5–15)
BUN: 18 mg/dL (ref 6–20)
CO2: 22 mmol/L (ref 22–32)
Calcium: 7.9 mg/dL — ABNORMAL LOW (ref 8.9–10.3)
Chloride: 109 mmol/L (ref 98–111)
Creatinine, Ser: 0.96 mg/dL (ref 0.61–1.24)
GFR calc Af Amer: >60 mL/min (ref >60)
GFR calc non Af Amer: >60 mL/min (ref >60)
Glucose, Bld: 91 mg/dL (ref 70–99)
Potassium: 3.3 mmol/L — ABNORMAL LOW (ref 3.5–5.1)
Sodium: 142 mmol/L (ref 135–145)
Total Bilirubin: 1 mg/dL (ref 0.3–1.2)
Total Protein: 5.9 g/dL — ABNORMAL LOW (ref 6.5–8.1)

## 2020-03-31 LAB — BLOOD GAS, ARTERIAL
Acid-base deficit: 2 mmol/L (ref 0.0–2.0)
Bicarbonate: 22.2 mmol/L (ref 20.0–28.0)
FIO2: 0.21
O2 Saturation: 96.9 %
Patient temperature: 37
pCO2 arterial: 35 mmHg (ref 32.0–48.0)
pH, Arterial: 7.41 (ref 7.350–7.450)
pO2, Arterial: 89 mmHg (ref 83.0–108.0)

## 2020-03-31 LAB — TROPONIN I (HIGH SENSITIVITY)
Troponin I (High Sensitivity): 8 ng/L (ref <18)
Troponin I (High Sensitivity): 7 ng/L (ref <18)

## 2020-03-31 LAB — LACTIC ACID, PLASMA
Lactic Acid, Venous: 0.6 mmol/L (ref 0.5–1.9)
Lactic Acid, Venous: 0.7 mmol/L (ref 0.5–1.9)

## 2020-03-31 LAB — GLUCOSE, CAPILLARY: Glucose-Capillary: 93 mg/dL (ref 70–99)

## 2020-03-31 MED ORDER — HALOPERIDOL LACTATE 5 MG/ML IJ SOLN
1.0000 mg | Freq: Four times a day (QID) | INTRAMUSCULAR | Status: DC | PRN
  Administered 2020-04-02 – 2020-04-04 (×2): 1 mg via INTRAVENOUS
  Filled 2020-03-31 (×2): qty 1

## 2020-03-31 MED ORDER — SODIUM CHLORIDE 0.9 % IV BOLUS
500.0000 mL | Freq: Once | INTRAVENOUS | Status: AC
  Administered 2020-03-31: 500 mL via INTRAVENOUS

## 2020-03-31 MED ORDER — DIPHENHYDRAMINE HCL 25 MG PO CAPS
50.0000 mg | ORAL_CAPSULE | Freq: Four times a day (QID) | ORAL | Status: DC | PRN
  Administered 2020-04-03: 50 mg via ORAL
  Filled 2020-03-31: qty 1
  Filled 2020-03-31 (×2): qty 2

## 2020-03-31 MED ORDER — DIPHENHYDRAMINE HCL 50 MG/ML IJ SOLN
6.2500 mg | INTRAMUSCULAR | Status: DC | PRN
  Administered 2020-04-02: 6.5 mg via INTRAVENOUS
  Filled 2020-03-31: qty 1

## 2020-03-31 MED ORDER — LORAZEPAM 2 MG/ML IJ SOLN
1.0000 mg | Freq: Four times a day (QID) | INTRAMUSCULAR | Status: DC | PRN
  Administered 2020-04-05: 1 mg via INTRAVENOUS
  Filled 2020-03-31 (×3): qty 1

## 2020-03-31 MED ORDER — POTASSIUM CHLORIDE 10 MEQ/100ML IV SOLN
10.0000 meq | INTRAVENOUS | Status: AC
  Administered 2020-03-31 (×2): 10 meq via INTRAVENOUS
  Filled 2020-03-31 (×2): qty 100

## 2020-03-31 MED ORDER — HALOPERIDOL 2 MG PO TABS
2.0000 mg | ORAL_TABLET | Freq: Four times a day (QID) | ORAL | Status: DC | PRN
  Filled 2020-03-31: qty 1

## 2020-03-31 NOTE — Progress Notes (Signed)
PROGRESS NOTE    [name]    Code Status: Full Code  FBP:[id number] DOB: 1971/05/12 DOA: 03/24/2020 LOS: 6 days  PCP: Patient, No Pcp Per CC:  Chief Complaint  Patient presents with  . Seizures       Hospital Summary   Lance Ray a48 y.o.Caucasian malewith a known history of seizure disorder, who has not been on his Depakote for months and presented to the emergency room with acute onset of witnessedgeneralized tonic-clonicseizure at [facility] [employer]. He was seen earlier in the ER, observed in the ER for 5 hours without recurrent seizure activityand given a prescription for Depakote ERand dischargedin a good Rx coupon. He had a recurrent tonic-clonicseizure in the parking lot that brought him back to the ER.He denied any tongue bites. He was fairly somnolent during my interview after being given IV Ativan and Keppra but was arousable. He denied any headache or dizziness or blurred vision. No paresthesias or focal muscle weakness. He stated that he has not taken his Depakote for 6 months and Lamictal for 2 weeks. No chest pain or dyspnea or cough or wheezing. No nausea or vomiting or abdominal pain. No fever or chills.  Upon presentation to the emergency room, blood pressure was 141/88 with otherwise normal vital signs. Earlier it was 128/89. Labs reviewed potassium of 3.6 with otherwise normal BMP and CBC was within normal. Noncontrasted head CT scan revealed no acute intracranial abnormalities.EKG showed sinus tachycardia with a rate of 115 with probable left atrial enlargement.  The patient was given 1 mg of IV lorazepam and was loaded with 1 g of IV Keppra. He was admitted to an observation PCU bed for further evaluation and management.  [facility] were consulted to admit the patient for further evaluation and treatment. The patient had been taking depakote for many years according to his sister. She stated that she did not think that it  worked well. She understood that when the patient presented to [facility] with a seizure a while ago that was why they had loaded the patient with keppra and started him on that. According to the sister the pattern is that the patient will be seen at a hospital ED after a seizure. He will be set up with one month of meds, and then he will stop taking his meds when he runs out. He will not act in his own self interest to maintain his health. They have looked into getting medicaid for him, but the patient is not cooperative with the process.  Psychiatry has been consulted to evaluate the patient's depression. Transitions of Care has been consulted to help set the patient up with outpatient mental health clinic and to help the patient to set up an ongoing source of antiepileptics.  On the morning of  the patient is complaining of dysarthria and difficulty walking. Per PT who is in the room, the patient is ataxic. I observe him trying to get back in bed. He is very unsteady on his feet. CT and MRI performed and were negative for acute pathology.  Later on 03/27/2020, after he had been seen and cleared by psychiatry, he required a sitter, because he was found by nursing standing in the middle of his bed "trying to fly like superman". Psychiatry asked to return to re-evaluate the patient. [name] came to re-evaluate the patient and put him on low dose antipsychotics and anxiolytics.   On 03/29/2020 hospitalist awas called to the patient's room regarding a security alert. The patient  had tried to leave and had become combative. He was treated with 1 mg ativan and 5 mg haldol IM. The patient was requiring physical restraint by nine healthcare workers and Catering managersecurity personnel. He claimed that he was being taken away for a "treatment" which was not the case. His sister arrived and attempted to talk to the patient and get him to calm down, but she was unsuccessful. The patient was very combative and violent with personnel  swinging and kicking at them. In my opinion, if this patient is allowed to leave the premises in his current state he is very likely to come to harm due to his psychotic and delusional state. He is also likely to cause harm to others. CM [name] said that I could not IVC the patient unless he said that he was going to kill himself, although he is clearly psychotic. She stated that only [name] could IVC the patient. [name] was called to the floor. I discussed the patient with him. He did IVC the patient pending stabilization.  The patient was transferred to the ICU last night after another outburst requiring a security alert. He is now on a precedex drip. The patient's sisters are working to obtain guardianship of the patient. Although it took precedex ultimately to calm the patient down and address his tachycardia and hypertension, I had not considered ETOH withdrawal due to the patient given history that he drank 2 beers and to shots of hard liquor a week. This may not have been accurate.    A & P   Active Problems:   Recurrent seizures (HCC)   Seizure (HCC)   1. Seizures likely secondary to medication noncompliance a. Continue keppra  b. TOC team on board   2. Acute encephalopathy/agitation, likely multifactorial: medication induced, seizures, possible alcohol withdrawal, underlying psychiatric issues a. CT brain and MRI brain unremarkable b. Continue precedex drip c. Appreciate medication assistance by psychiatry d. Continue thiamine e. Continue stepdown level care f. Follow up mentation once meds have worn off  3. Alcohol withdrawal a. Continue precedex  4. Depression a. Psychiatry on board, appreciate assistance  5. Hypertension a. Hypotensive this AM b. Hold antihypertensives for now and give bolus NS  6. Hypokalemia a. Replace IV  7. SIRS a. Febrile, tachycardic, hypotensive b. Probably from poor PO intake and medication induced c. IV fluid bolus d. Check blood  cultures e. Consider CXR if recurrent fever to see if patient has aspirated   DVT prophylaxis: enoxaparin (LOVENOX) injection 40 mg Start: 03/24/20 2315   Family Communication: no family at bedside  Disposition Plan:  Status is: Inpatient  Remains inpatient appropriate because:IV treatments appropriate due to intensity of illness or inability to take PO and Inpatient level of care appropriate due to severity of illness   Dispo: The patient is from: Home              Anticipated d/c is to: TBD              Anticipated d/c date is: > 3 days              Patient currently is not medically stable to d/c.          Pressure injury documentation    None  Consultants  Psych neuro  Procedures  None  Antibiotics   Anti-infectives (From admission, onward)   None        Subjective   Notified by nursing that the patient was more combative  overnight and received haldol, benadryl and ativan and had benzo last night. Also hypotensive this AM. Currently patient is sleeping  Objective   Vitals:   03/31/20 0400 03/31/20 0800 03/31/20 0900 03/31/20 1000  BP: 117/82   (!) 89/66  Pulse:      Resp:      Temp:  99.1 F (37.3 C) 97.8 F (36.6 C) 97.9 F (36.6 C)  TempSrc:  Axillary Axillary Axillary  SpO2:      Weight:      Height:        Intake/Output Summary (Last 24 hours) at 03/31/2020 1501 Last data filed at 03/31/2020 0200 Gross per 24 hour  Intake 213.6 ml  Output --  Net 213.6 ml   Filed Weights   03/24/20 2220  Weight: 81.6 kg    Examination:  Physical Exam Vitals and nursing note reviewed. Exam conducted with a chaperone present.  Constitutional:      Appearance: He is not ill-appearing or diaphoretic.     Comments: arousable to painful stimuli only  Eyes:     Pupils: Pupils are equal, round, and reactive to light.  Cardiovascular:     Rate and Rhythm: Normal rate and regular rhythm.  Pulmonary:     Effort: Pulmonary effort is normal.      Breath sounds: Normal breath sounds.  Musculoskeletal:     Right lower leg: No edema.     Left lower leg: No edema.  Neurological:     Mental Status: He is disoriented.     Data Reviewed: I have personally reviewed following labs and imaging studies  CBC: Recent Labs  Lab 03/24/20 1703 03/25/20 0419 03/28/20 0605 03/31/20 0917  WBC 7.1 8.0 4.2 4.8  NEUTROABS  --   --   --  3.1  HGB 13.4 12.4* 11.1* 11.6*  HCT 37.9* 37.2* 32.6* 33.9*  MCV 85.0 88.6 87.6 88.1  PLT 281 263 213 248   Basic Metabolic Panel: Recent Labs  Lab 03/24/20 1703 03/25/20 0419 03/28/20 0605 03/29/20 0719 03/31/20 0917  NA 143 141 143 141 142  K 3.6 3.5 3.4* 4.2 3.3*  CL 110 108 111 107 109  CO2 24 24 28 26 22   GLUCOSE 101* 96 92 81 91  BUN 14 11 6 8 18   CREATININE 1.05 0.94 0.75 1.05 0.96  CALCIUM 8.8* 8.3* 8.1* 8.8* 7.9*   GFR: Estimated Creatinine Clearance: 93.1 mL/min (by C-G formula based on SCr of 0.96 mg/dL). Liver Function Tests: Recent Labs  Lab 03/31/20 0917  AST 56*  ALT 16  ALKPHOS 69  BILITOT 1.0  PROT 5.9*  ALBUMIN 3.5   No results for input(s): LIPASE, AMYLASE in the last 168 hours. No results for input(s): AMMONIA in the last 168 hours. Coagulation Profile: No results for input(s): INR, PROTIME in the last 168 hours. Cardiac Enzymes: No results for input(s): CKTOTAL, CKMB, CKMBINDEX, TROPONINI in the last 168 hours. BNP (last 3 results) No results for input(s): PROBNP in the last 8760 hours. HbA1C: No results for input(s): HGBA1C in the last 72 hours. CBG: Recent Labs  Lab 03/31/20 0909  GLUCAP 93   Lipid Profile: No results for input(s): CHOL, HDL, LDLCALC, TRIG, CHOLHDL, LDLDIRECT in the last 72 hours. Thyroid Function Tests: No results for input(s): TSH, T4TOTAL, FREET4, T3FREE, THYROIDAB in the last 72 hours. Anemia Panel: No results for input(s): VITAMINB12, FOLATE, FERRITIN, TIBC, IRON, RETICCTPCT in the last 72 hours. Sepsis Labs: Recent Labs    Lab 03/31/20 06/01/20 03/31/20  1222  LATICACIDVEN 0.6 0.7    Recent Results (from the past 240 hour(s))  SARS Coronavirus 2 by RT PCR (hospital order, performed in [facility] hospital lab) Nasopharyngeal Nasopharyngeal Swab     Status: None   Collection Time: 03/25/20  4:13 PM   Specimen: Nasopharyngeal Swab  Result Value Ref Range Status   SARS Coronavirus 2 NEGATIVE NEGATIVE Final    Comment: (NOTE) SARS-CoV-2 target nucleic acids are NOT DETECTED.  The SARS-CoV-2 RNA is generally detectable in upper and lower respiratory specimens during the acute phase of infection. The lowest concentration of SARS-CoV-2 viral copies this assay can detect is 250 copies / mL. A negative result does not preclude SARS-CoV-2 infection and should not be used as the sole basis for treatment or other patient management decisions.  A negative result may occur with improper specimen collection / handling, submission of specimen other than nasopharyngeal swab, presence of viral mutation(s) within the areas targeted by this assay, and inadequate number of viral copies (<250 copies / mL). A negative result must be combined with clinical observations, patient history, and epidemiological information.  Fact Sheet for Patients:   BoilerBrush.com.cy  Fact Sheet for Healthcare Providers: https://pope.com/  This test is not yet approved or  cleared by the [place] FDA and has been authorized for detection and/or diagnosis of SARS-CoV-2 by FDA under an Emergency Use Authorization (EUA).  This EUA will remain in effect (meaning this test can be used) for the duration of the COVID-19 declaration under Section 564(b)(1) of the Act, 21 U.S.C. section 360bbb-3(b)(1), unless the authorization is terminated or revoked sooner.  Performed at [facility], [address], [place], [place] [address]          Radiology Studies: No results  found.      Scheduled Meds: . benztropine  1 mg Oral BID  . Chlorhexidine Gluconate Cloth  6 each Topical Q0600  . clonazePAM  0.5 mg Oral TID  . dexmedetomidine  1 mcg/kg Intravenous Once  . DULoxetine  20 mg Oral Daily  . enoxaparin (LOVENOX) injection  40 mg Subcutaneous Q24H  . haloperidol  0.5 mg Oral TID  . lamoTRIgine  150 mg Oral BID  . OLANZapine zydis  15 mg Oral QHS  . thiamine injection  100 mg Intravenous Daily   Continuous Infusions: . sodium chloride Stopped (03/29/20 2141)  . dexmedetomidine (PRECEDEX) IV infusion Stopped (03/31/20 0830)  . levETIRAcetam 1,000 mg (03/31/20 0743)     Time spent: 40 minutes with over 50% of the time coordinating the patient's care    Jae Dire, DO [facility] Pager [phone]  Call night coverage person covering after 7pm

## 2020-03-31 NOTE — Progress Notes (Signed)
[facility]  03/31/2020 2:55 PM Lance Ray        MRN:  [id number] Subjective:   Here in follow up  Principal Problem: <principal problem not specified> Diagnosis: Active Problems:   Recurrent seizures (HCC)   Seizure (HCC)  Total Time spent with patient:  15 --20    Past Psychiatric History:     Past Medical History:  Past Medical History:  Diagnosis Date  . Seizures (HCC)     Past Surgical History:  Procedure Laterality Date  . SKIN GRAFT     Family History:  Family History  Problem Relation Age of Onset  . Seizures Mother    Family Psychiatric  History:  Social History:  Social History   Substance and Sexual Activity  Alcohol Use Yes  . Alcohol/week: 2.0 standard drinks  . Types: 2 Shots of liquor per week   Comment: "rarely"      Social History   Substance and Sexual Activity  Drug Use No    Social History   Socioeconomic History  . Marital status: Single    Spouse name: Not on file  . Number of children: Not on file  . Years of education: Not on file  . Highest education level: Not on file  Occupational History  . Not on file  Tobacco Use  . Smoking status: Current Some Day Smoker  . Smokeless tobacco: Current User  Vaping Use  . Vaping Use: Never used  Substance and Sexual Activity  . Alcohol use: Yes    Alcohol/week: 2.0 standard drinks    Types: 2 Shots of liquor per week    Comment: "rarely"   . Drug use: No  . Sexual activity: Yes  Other Topics Concern  . Not on file  Social History Narrative  . Not on file   Social Determinants of Health   Financial Resource Strain:   . Difficulty of Paying Living Expenses:   Food Insecurity:   . Worried About Programme researcher, broadcasting/film/videounning Out of Food in the Last Year:   . Baristaan Out of Food in the Last Year:   Transportation Needs:   . Freight forwarderLack of Transportation (Medical):   [name]. Lack of Transportation (Non-Medical):   Physical Activity:   . Days of Exercise per Week:   . Minutes of Exercise per  Session:   Stress:   . Feeling of Stress :   Social Connections:   . Frequency of Communication with Friends and Family:   . Frequency of Social Gatherings with Friends and Family:   . Attends Religious Services:   . Active Member of Clubs or Organizations:   . Attends BankerClub or Organization Meetings:   [name]. Marital Status:    Additional Social History:         He is still needing low dose IV psych meds for agitaton  5 mg haldol ---seemed to have lowered bp   Recommended to start with   1 mg haldol 1 mg ativan and 6.25 benadryl q 4hrs and titrate as needed   He is still resistant to oral medicines but seizure control at least has been obtained     Current Medications: Current Facility-Administered Medications  Medication Dose Route Frequency Provider Last Rate Last Admin  . 0.9 %  sodium chloride infusion   Intravenous PRN Swayze, Ava, DO   Stopped at 03/29/20 2141  . acetaminophen (TYLENOL) tablet 650 mg  650 mg Oral Q6H PRN Mansy, Vernetta HoneyJan A, MD   650 mg at  03/30/20 2144   Or  . acetaminophen (TYLENOL) suppository 650 mg  650 mg Rectal Q6H PRN Mansy, Jan A, MD      . benztropine (COGENTIN) tablet 1 mg  1 mg Oral BID [name], MD   1 mg at 03/30/20 2154  . Chlorhexidine Gluconate Cloth 2 % PADS 6 each  6 each Topical Q0600 Manuela Schwartz, NP   6 each at 03/31/20 0500  . clonazePAM (KLONOPIN) disintegrating tablet 0.5 mg  0.5 mg Oral TID [name], MD   0.5 mg at 03/30/20 2145  . dexmedetomidine (PRECEDEX) 200 MCG/50ML (4 mcg/mL) infusion  0.4-1.2 mcg/kg/hr Intravenous Titrated Manuela Schwartz, NP   Stopped at 03/31/20 0830  . dexmedetomidine (PRECEDEX) bolus via infusion 81.6 mcg  1 mcg/kg Intravenous Once Manuela Schwartz, NP      . diphenhydrAMINE (BENADRYL) capsule 50 mg  50 mg Oral Q6H PRN [name], MD       Or  . diphenhydrAMINE (BENADRYL) injection 50 mg  50 mg Intramuscular Q6H PRN [name], MD   50 mg at 03/31/20 0742  . DULoxetine (CYMBALTA)  DR capsule 20 mg  20 mg Oral Daily [name], MD      . enoxaparin (LOVENOX) injection 40 mg  40 mg Subcutaneous Q24H Mansy, Jan A, MD   40 mg at 03/29/20 2156  . haloperidol (HALDOL) tablet 0.5 mg  0.5 mg Oral TID [name], MD   0.5 mg at 03/30/20 2154  . haloperidol lactate (HALDOL) injection 5 mg  5 mg Intramuscular Q6H PRN [name], MD   5 mg at 03/31/20 5409   Or  . haloperidol (HALDOL) tablet 2 mg  2 mg Oral Q6H PRN [name], MD      . hydrALAZINE (APRESOLINE) tablet 50 mg  50 mg Oral Q6H PRN Mansy, Jan A, MD      . lamoTRIgine (LAMICTAL) tablet 150 mg  150 mg Oral BID Thana Farr, MD   150 mg at 03/30/20 2152  . levETIRAcetam (KEPPRA) IVPB 1000 mg/100 mL premix  1,000 mg Intravenous Q12H Thana Farr, MD 400 mL/hr at 03/31/20 0743 1,000 mg at 03/31/20 0743  . LORazepam (ATIVAN) injection 1 mg  1 mg Intravenous Q1H PRN Mansy, Jan A, MD   1 mg at 03/31/20 0735  . magnesium hydroxide (MILK OF MAGNESIA) suspension 30 mL  30 mL Oral Daily PRN Mansy, Jan A, MD      . OLANZapine (ZYPREXA) injection 10 mg  10 mg Intramuscular Q6H PRN [name], MD   10 mg at 03/30/20 0322  . OLANZapine zydis (ZYPREXA) disintegrating tablet 15 mg  15 mg Oral QHS [name], MD   15 mg at 03/30/20 2159  . ondansetron (ZOFRAN) tablet 4 mg  4 mg Oral Q6H PRN Mansy, Jan A, MD       Or  . ondansetron [facility]) injection 4 mg  4 mg Intravenous Q6H PRN Mansy, Jan A, MD      . thiamine (B-1) injection 100 mg  100 mg Intravenous Daily Swayze, Ava, DO   100 mg at 03/31/20 1051    Lab Results:  Results for orders placed or performed during the hospital encounter of 03/24/20 (from the past 48 hour(s))  Glucose, capillary     Status: None   Collection Time: 03/31/20  9:09 AM  Result Value Ref Range   Glucose-Capillary 93 70 - 99 mg/dL    Comment: Glucose reference range applies only to samples taken after fasting for at  least 8 hours.  Blood gas, arterial     Status:  None   Collection Time: 03/31/20  9:13 AM  Result Value Ref Range   FIO2 0.21    pH, Arterial 7.41 7.35 - 7.45   pCO2 arterial 35 32 - 48 mmHg   pO2, Arterial 89 83 - 108 mmHg   Bicarbonate 22.2 20.0 - 28.0 mmol/L   Acid-base deficit 2.0 0.0 - 2.0 mmol/L   O2 Saturation 96.9 %   Patient temperature 37.0    Collection site RIGHT RADIAL    Sample type ARTERIAL DRAW    Allens test (pass/fail) PASS PASS    Comment: Performed at [facility], [address], [place], [place] [address]  Troponin I (High Sensitivity)     Status: None   Collection Time: 03/31/20  9:17 AM  Result Value Ref Range   Troponin I (High Sensitivity) 8 <18 ng/L    Comment: (NOTE) Elevated high sensitivity troponin I (hsTnI) values and significant  changes across serial measurements may suggest ACS but many other  chronic and acute conditions are known to elevate hsTnI results.  Refer to the "Links" section for chest pain algorithms and additional  guidance. Performed at [facility], [address] [place], [place] [address]   CBC with Differential/Platelet     Status: Abnormal   Collection Time: 03/31/20  9:17 AM  Result Value Ref Range   WBC 4.8 4.0 - 10.5 K/uL   RBC 3.85 (L) 4.22 - 5.81 MIL/uL   Hemoglobin 11.6 (L) 13.0 - 17.0 g/dL   HCT 09.3 (L) 39 - 52 %   MCV 88.1 80.0 - 100.0 fL   MCH 30.1 26.0 - 34.0 pg   MCHC 34.2 30.0 - 36.0 g/dL   RDW 81.8 29.9 - 37.1 %   Platelets 248 150 - 400 K/uL   nRBC 0.0 0.0 - 0.2 %   Neutrophils Relative % 65 %   Neutro Abs 3.1 1.7 - 7.7 K/uL   Lymphocytes Relative 22 %   Lymphs Abs 1.0 0.7 - 4.0 K/uL   Monocytes Relative 10 %   Monocytes Absolute 0.5 0 - 1 K/uL   Eosinophils Relative 2 %   Eosinophils Absolute 0.1 0 - 0 K/uL   Basophils Relative 1 %   Basophils Absolute 0.0 0 - 0 K/uL   Immature Granulocytes 0 %   Abs Immature Granulocytes 0.01 0.00 - 0.07 K/uL    Comment: Performed at [facility], [address]  [place], [place] [address]  Comprehensive metabolic panel     Status: Abnormal   Collection Time: 03/31/20  9:17 AM  Result Value Ref Range   Sodium 142 135 - 145 mmol/L   Potassium 3.3 (L) 3.5 - 5.1 mmol/L   Chloride 109 98 - 111 mmol/L   CO2 22 22 - 32 mmol/L   Glucose, Bld 91 70 - 99 mg/dL    Comment: Glucose reference range applies only to samples taken after fasting for at least 8 hours.   BUN 18 6 - 20 mg/dL   Creatinine, Ser 9.38 0.61 - 1.24 mg/dL   Calcium 7.9 (L) 8.9 - 10.3 mg/dL   Total Protein 5.9 (L) 6.5 - 8.1 g/dL   Albumin 3.5 3.5 - 5.0 g/dL   AST 56 (H) 15 - 41 U/L   ALT 16 0 - 44 U/L   Alkaline Phosphatase 69 38 - 126 U/L   Total Bilirubin 1.0 0.3 - 1.2 mg/dL  GFR calc non Af Amer >60 >60 mL/min   GFR calc Af Amer >60 >60 mL/min   Anion gap 11 5 - 15    Comment: Performed at [facility], [address] [place], [place] [address]  Lactic acid, plasma     Status: None   Collection Time: 03/31/20  9:17 AM  Result Value Ref Range   Lactic Acid, Venous 0.6 0.5 - 1.9 mmol/L    Comment: Performed at [facility], [address] [place], [place] [address]  Lactic acid, plasma     Status: None   Collection Time: 03/31/20 12:22 PM  Result Value Ref Range   Lactic Acid, Venous 0.7 0.5 - 1.9 mmol/L    Comment: Performed at [facility], [address] [place], [place] [address]  Troponin I (High Sensitivity)     Status: None   Collection Time: 03/31/20 12:22 PM  Result Value Ref Range   Troponin I (High Sensitivity) 7 <18 ng/L    Comment: (NOTE) Elevated high sensitivity troponin I (hsTnI) values and significant  changes across serial measurements may suggest ACS but many other  chronic and acute conditions are known to elevate hsTnI results.  Refer to the "Links" section for chest pain algorithms and additional  guidance. Performed at [facility], [address] [place], [place] [address]     Blood Alcohol level:  Lab  Results  Component Value Date   [facility] <10 11/03/2019   ETH <5 12/05/2016    Metabolic Disorder Labs: No results found for: HGBA1C, MPG No results found for: PROLACTIN No results found for: CHOL, TRIG, HDL, CHOLHDL, VLDL, LDLCALC  Physical Findings: AIMS:  , ,  ,  ,    CIWA:    COWS:     Musculoskeletal: Strength & Muscle Tone:  None about same   Gait & Station:  None  New about the same  Patient leans:   N/A  Psychiatric Specialty Exam: Physical Exam  Review of Systems  Blood pressure (!) 89/66, pulse 88, temperature 97.9 F (36.6 C), temperature source Axillary, resp. rate 14, height 5\' 9"  (1.753 m), weight 81.6 kg, SpO2 96 %.Body mass index is 26.58 kg/m.      Mental Status  He is about the same He is oriented to person place and time He is somewhat sedated ---no other new psychosis or mania Not clear on cooperation just yet  Still refuses oral meds at times  No active SI or plans                                                    Treatment Plan Summary:  Continues with ---current attempts to stabilize meds.  If he agrees to regular oral meds --he can be more easily transferred    , MD 03/31/2020, 2:55 PM
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
## 2020-04-01 DIAGNOSIS — F10239 Alcohol dependence with withdrawal, unspecified: Secondary | ICD-10-CM

## 2020-04-01 LAB — BASIC METABOLIC PANEL
Anion gap: 13 (ref 5–15)
BUN: 13 mg/dL (ref 6–20)
CO2: 20 mmol/L — ABNORMAL LOW (ref 22–32)
Calcium: 8.7 mg/dL — ABNORMAL LOW (ref 8.9–10.3)
Chloride: 107 mmol/L (ref 98–111)
Creatinine, Ser: 0.98 mg/dL (ref 0.61–1.24)
GFR calc Af Amer: >60 mL/min (ref >60)
GFR calc non Af Amer: >60 mL/min (ref >60)
Glucose, Bld: 78 mg/dL (ref 70–99)
Potassium: 4 mmol/L (ref 3.5–5.1)
Sodium: 140 mmol/L (ref 135–145)

## 2020-04-01 LAB — CBC
HCT: 38.5 % — ABNORMAL LOW (ref 39.0–52.0)
Hemoglobin: 12.8 g/dL — ABNORMAL LOW (ref 13.0–17.0)
MCH: 29.2 pg (ref 26.0–34.0)
MCHC: 33.2 g/dL (ref 30.0–36.0)
MCV: 87.9 fL (ref 80.0–100.0)
Platelets: 324 10*3/uL (ref 150–400)
RBC: 4.38 MIL/uL (ref 4.22–5.81)
RDW: 14.5 % (ref 11.5–15.5)
WBC: 6.2 10*3/uL (ref 4.0–10.5)
nRBC: 0 % (ref 0.0–0.2)

## 2020-04-01 LAB — MAGNESIUM: Magnesium: 2 mg/dL (ref 1.7–2.4)

## 2020-04-01 MED ORDER — OLANZAPINE 5 MG PO TBDP
7.5000 mg | ORAL_TABLET | Freq: Every day | ORAL | Status: DC
  Administered 2020-04-01 – 2020-04-07 (×7): 7.5 mg via ORAL
  Filled 2020-04-01 (×8): qty 1.5

## 2020-04-01 MED ORDER — CLONAZEPAM 0.25 MG PO TBDP
0.2500 mg | ORAL_TABLET | Freq: Two times a day (BID) | ORAL | Status: DC
  Administered 2020-04-02 – 2020-04-08 (×13): 0.25 mg via ORAL
  Filled 2020-04-01 (×11): qty 1
  Filled 2020-04-01: qty 2
  Filled 2020-04-01 (×2): qty 1

## 2020-04-01 MED ORDER — HALOPERIDOL 0.5 MG PO TABS
0.2500 mg | ORAL_TABLET | Freq: Two times a day (BID) | ORAL | Status: DC
  Administered 2020-04-02 – 2020-04-08 (×13): 0.25 mg via ORAL
  Filled 2020-04-01 (×14): qty 0.5

## 2020-04-01 NOTE — Progress Notes (Signed)
PROGRESS NOTE    Lance Ray    Code Status: Full Code  WUJ:[id number] DOB: 1971/08/29 DOA: 03/24/2020 LOS: 7 days  PCP: Patient, No Pcp Per CC:  Chief Complaint  Patient presents with  . Seizures       Hospital Summary   Lance Ray, Lance has not been on his Depakote for months and presented to the emergency room with acute onset of witnessedgeneralized tonic-clonicseizure at [employer] [facility]. He was seen earlier in the ER, observed in the ER for 5 hours without recurrent seizure activityand given a prescription for Depakote ERand dischargedin a good Rx coupon. He had a recurrent tonic-clonicseizure in the parking lot that brought him back to the ER.He denied any tongue bites. He was fairly somnolent during my interview after being given IV Ativan and Keppra but was arousable. He denied any headache or dizziness or blurred vision. No paresthesias or focal muscle weakness. He stated that he has not taken his Depakote for 6 months and Lamictal for 2 weeks. No chest pain or dyspnea or cough or wheezing. No nausea or vomiting or abdominal pain. No fever or chills.  Upon presentation to the emergency room, blood pressure was 141/88 with otherwise normal vital signs. Earlier it was 128/89. Labs reviewed potassium of 3.6 with otherwise normal BMP and CBC was within normal. Noncontrasted head CT scan revealed no acute intracranial abnormalities.EKG showed sinus tachycardia with a rate of 115 with probable left atrial enlargement.  The patient was given 1 mg of IV lorazepam and was loaded with 1 g of IV Keppra. He was admitted to an observation PCU bed for further evaluation and management.  [facility] were consulted to admit the patient for further evaluation and treatment. The patient had been taking depakote for many years according to his sister. She stated that she did not think that it  worked well. She understood that when the [facility] that was why they had loaded the patient with keppra and started him on that. According to the sister the pattern is that the patient will be seen at a hospital ED after a seizure. He will be set up with one month of meds, and then he will stop taking his meds when he runs out. He will not act in his own self interest to maintain his health. They have looked into getting medicaid for him, but the patient is not cooperative with the process.  Psychiatry has been consulted to evaluate the patient's depression. Transitions of Care has been consulted to help set the patient up with outpatient mental health clinic and to help the patient to set up an ongoing source of antiepileptics.  On the morning of  the patient is complaining of dysarthria and difficulty walking. Per PT Lance is in the room, the patient is ataxic. I observe him trying to get back in bed. He is very unsteady on his feet. CT and MRI performed and were negative for acute pathology.  Later on 03/27/2020, after he had been seen and cleared by psychiatry, he required a sitter, because he was found by nursing standing in the middle of his bed "trying to fly like superman". Psychiatry asked to return to re-evaluate the patient. [name] came to re-evaluate the patient and put him on low dose antipsychotics and anxiolytics.   On 03/29/2020 hospitalist awas called to the patient's room regarding a security alert. The patient  had tried to leave and had become combative. He was treated with 1 mg ativan and 5 mg haldol IM. The patient was requiring physical restraint by nine healthcare workers and Catering manager. He claimed that he was being taken away for a "treatment" which was not the case. His sister arrived and attempted to talk to the patient and get him to calm down, but she was unsuccessful. The patient was very combative and violent with personnel  swinging and kicking at them. In my opinion, if this patient is allowed to leave the premises in his current state he is very likely to come to harm due to his psychotic and delusional state. He is also likely to cause harm to others. CM [name] said that I could not IVC the patient unless he said that he was going to kill himself, although he is clearly psychotic. She stated that only [name] could IVC the patient. [name] was called to the floor. I discussed the patient with him. He did IVC the patient pending stabilization.  The patient was transferred to the ICU last night after another outburst requiring a security alert. He is now on a precedex drip. The patient's sisters are working to obtain guardianship of the patient. Although it took precedex ultimately to calm the patient down and address his tachycardia and hypertension, I had not considered ETOH withdrawal due to the patient given history that he drank 2 beers and to shots of hard liquor a week. This may not have been accurate.    A & P   Active Problems:   Recurrent seizures (HCC)   Seizure (HCC)   1. Seizures likely secondary to medication noncompliance a. Continue keppra  b. TOC team on board   2. Acute encephalopathy/agitation, likely multifactorial: medication induced, seizures, possible alcohol withdrawal, underlying psychiatric issues a. CT brain and MRI brain unremarkable b. Off precedex, doing well today c. Continue thiamine d. Continue stepdown level care and can likely transfer out tomorrow as long as his mentation remains stable over the next 24 hours e. Continue PO antipsychotic meds and try to avoid IV   3. Alcohol withdrawal a. Doing well off precedex  4. Depression a. Psychiatry on board, appreciate assistance  5. Hypertension a. Hypotensive this AM b. Hold antihypertensives for now and give bolus NS  6. Hypokalemia a. Replace IV  7. SIRS, resolved overall a. Probably from poor PO intake and  medication induced b. Still tachycardic  c. Blood cultures no growth to date   DVT prophylaxis: enoxaparin (LOVENOX) injection 40 mg Start: 03/24/20 2315   Family Communication: no family at bedside  Disposition Plan:  Status is: Inpatient  Remains inpatient appropriate because:IV treatments appropriate due to intensity of illness or inability to take PO and Inpatient level of care appropriate due to severity of illness   Dispo: The patient is from: Home              Anticipated d/c is to: TBD              Anticipated d/c date is: > 3 days              Patient currently is not medically stable to d/c.          Pressure injury documentation    None  Consultants  Psych neuro  Procedures  None  Antibiotics   Anti-infectives (From admission, onward)   None        Subjective   Awake this  morning, 1:1 sitter present. Patient without complaints and more conversant but falling asleep   Objective   Vitals:   04/01/20 0200 04/01/20 0400 04/01/20 0800 04/01/20 1200  BP: (!) 142/93     Pulse: 97     Resp: 19     Temp:  99 F (37.2 C) 98.1 F (36.7 C) 98.2 F (36.8 C)  TempSrc:   Oral Oral  SpO2: 100%     Weight:      Height:        Intake/Output Summary (Last 24 hours) at 04/01/2020 1527 Last data filed at 04/01/2020 0200 Gross per 24 hour  Intake 2133.74 ml  Output 425 ml  Net 1708.74 ml   Filed Weights   03/24/20 2220  Weight: 81.6 kg    Examination:  Physical Exam Vitals and nursing note reviewed. Exam conducted with a chaperone present.  Constitutional:      General: He is not in acute distress. HENT:     Head: Normocephalic.     Mouth/Throat:     Mouth: Mucous membranes are moist.  Eyes:     Conjunctiva/sclera: Conjunctivae normal.  Cardiovascular:     Rate and Rhythm: Regular rhythm. Tachycardia present.  Pulmonary:     Effort: Pulmonary effort is normal. No respiratory distress.  Abdominal:     General: Abdomen is flat. There is  no distension.  Musculoskeletal:        General: No swelling or tenderness.  Skin:    Coloration: Skin is not jaundiced or pale.  Neurological:     Mental Status: He is alert and oriented to person, place, and time.  Psychiatric:        Mood and Affect: Mood normal.     Data Reviewed: I have personally reviewed following labs and imaging studies  CBC: Recent Labs  Lab 03/28/20 0605 03/31/20 0917 04/01/20 0429  WBC 4.2 4.8 6.2  NEUTROABS  --  3.1  --   HGB 11.1* 11.6* 12.8*  HCT 32.6* 33.9* 38.5*  MCV 87.6 88.1 87.9  PLT 213 248 324   Basic Metabolic Panel: Recent Labs  Lab 03/28/20 0605 03/29/20 0719 03/31/20 0917 04/01/20 0429  NA 143 141 142 140  K 3.4* 4.2 3.3* 4.0  CL 111 107 109 107  CO2 28 26 22  20*  GLUCOSE 92 81 91 78  BUN 6 8 18 13   CREATININE 0.75 1.05 0.96 0.98  CALCIUM 8.1* 8.8* 7.9* 8.7*  MG  --   --   --  2.0   GFR: Estimated Creatinine Clearance: 91.2 mL/min (by C-G formula based on SCr of 0.98 mg/dL). Liver Function Tests: Recent Labs  Lab 03/31/20 0917  AST 56*  ALT 16  ALKPHOS 69  BILITOT 1.0  PROT 5.9*  ALBUMIN 3.5   No results for input(s): LIPASE, AMYLASE in the last 168 hours. No results for input(s): AMMONIA in the last 168 hours. Coagulation Profile: No results for input(s): INR, PROTIME in the last 168 hours. Cardiac Enzymes: No results for input(s): CKTOTAL, CKMB, CKMBINDEX, TROPONINI in the last 168 hours. BNP (last 3 results) No results for input(s): PROBNP in the last 8760 hours. HbA1C: No results for input(s): HGBA1C in the last 72 hours. CBG: Recent Labs  Lab 03/31/20 0909  GLUCAP 93   Lipid Profile: No results for input(s): CHOL, HDL, LDLCALC, TRIG, CHOLHDL, LDLDIRECT in the last 72 hours. Thyroid Function Tests: No results for input(s): TSH, T4TOTAL, FREET4, T3FREE, THYROIDAB in the last 72 hours. Anemia Panel:  No results for input(s): VITAMINB12, FOLATE, FERRITIN, TIBC, IRON, RETICCTPCT in the last 72  hours. Sepsis Labs: Recent Labs  Lab 03/31/20 0917 03/31/20 1222  LATICACIDVEN 0.6 0.7    Recent Results (from the past 240 hour(s))  SARS Coronavirus 2 by RT PCR (hospital order, performed in [facility] hospital lab) Nasopharyngeal Nasopharyngeal Swab     Status: None   Collection Time: 03/25/20  4:13 PM   Specimen: Nasopharyngeal Swab  Result Value Ref Range Status   SARS Coronavirus 2 NEGATIVE NEGATIVE Final    Comment: (NOTE) SARS-CoV-2 target nucleic acids are NOT DETECTED.  The SARS-CoV-2 RNA is generally detectable in upper and lower respiratory specimens during the acute phase of infection. The lowest concentration of SARS-CoV-2 viral copies this assay can detect is 250 copies / mL. A negative result does not preclude SARS-CoV-2 infection and should not be used as the sole basis for treatment or other patient management decisions.  A negative result may occur with improper specimen collection / handling, submission of specimen other than nasopharyngeal swab, presence of viral mutation(s) within the areas targeted by this assay, and inadequate number of viral copies (<250 copies / mL). A negative result must be combined with clinical observations, patient history, and epidemiological information.  Fact Sheet for Patients:   BoilerBrush.com.cy  Fact Sheet for Healthcare Providers: https://pope.com/  This test is not yet approved or  cleared by the [place] FDA and has been authorized for detection and/or diagnosis of SARS-CoV-2 by FDA under an Emergency Use Authorization (EUA).  This EUA will remain in effect (meaning this test can be used) for the duration of the COVID-19 declaration under Section 564(b)(1) of the Act, 21 U.S.C. section 360bbb-3(b)(1), unless the authorization is terminated or revoked sooner.  Performed at [facility], [address] [place], [place] [address]   Culture, blood  (Routine X 2) w Reflex to ID Panel     Status: None (Preliminary result)   Collection Time: 03/31/20 10:34 AM   Specimen: BLOOD  Result Value Ref Range Status   Specimen Description BLOOD BLOOD RIGHT ARM  Final   Special Requests   Final    BOTTLES DRAWN AEROBIC ONLY Blood Culture results may not be optimal due to an inadequate volume of blood received in culture bottles   Culture   Final    NO GROWTH < 24 HOURS Performed at [facility], [address], [place], [place] [address]    Report Status PENDING  Incomplete  Culture, blood (Routine X 2) w Reflex to ID Panel     Status: None (Preliminary result)   Collection Time: 03/31/20 10:41 AM   Specimen: BLOOD  Result Value Ref Range Status   Specimen Description BLOOD LEFT ANTECUBITAL  Final   Special Requests   Final    BOTTLES DRAWN AEROBIC AND ANAEROBIC Blood Culture adequate volume   Culture   Final    NO GROWTH < 24 HOURS Performed at [facility], [address], [place], [place] [address]    Report Status PENDING  Incomplete         Radiology Studies: No results found.      Scheduled Meds: . benztropine  1 mg Oral BID  . Chlorhexidine Gluconate Cloth  6 each Topical Q0600  . clonazePAM  0.5 mg Oral TID  . dexmedetomidine  1 mcg/kg Intravenous Once  . DULoxetine  20 mg Oral Daily  . enoxaparin (LOVENOX) injection  40 mg Subcutaneous Q24H  .  haloperidol  0.5 mg Oral TID  . lamoTRIgine  150 mg Oral BID  . OLANZapine zydis  15 mg Oral QHS  . thiamine injection  100 mg Intravenous Daily   Continuous Infusions: . sodium chloride Stopped (03/29/20 2141)  . levETIRAcetam 1,000 mg (04/01/20 0736)     Time spent: 33 minutes with over 50% of the time coordinating the patient's care    Jae Dire, DO [facility] Pager [phone]  Call night coverage person covering after 7pm

## 2020-04-01 NOTE — Progress Notes (Addendum)
Patient ID: Lance Ray, male   DOB: 1971-05-18, 49 y.o.   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  Personality disorder nos  Dependent, narcissistic avoidant traits     Total Time spent with patient: 25-30 min DAILY NARRATIVE  Subjective:  Patient is mumbling but answers yes no questions Does not seem delirious but more somewhat sedated   Objective:   His appearance actually looks better, less haggard forlorn and unkept   Physical Findings: AIMS:  , ,  ,  ,   not done  CIWA:    COWS:     PHYSICAL EXAM  Physical Exam  SYSTEMS REVIEW  Review of Systems  Looked at 8 systems   MENTAL STATUS  General Appearance: actually better   Rapport/Eye Contact:  Eyes closed m umbling   Orientation:  Times four ok   Consciousness:  Sleepy but not fluctuant or fully delirious   Concentration:  Poor   Mood/Affect:   Labile and somewhat flat   Language/Speech:   Mumbling, cannot fully understand at this point   Rate:  Volume:  Fluency:  Articulation:  Thought Process:  Not clear   Mumbling  Thought Content:  Does not have frank psychosis today per se   SUICIDAlIty  /HOMICIDAlity   Risk to Self:   could break into psychotic and agitated state but  Is now more cooperative taking  Risk to Others:    Memory/Recall:  Cannot assess mumbling   Immediate:  Recent:  Remote:  Fund of Knowledge/Intelligence/Cognition:    Judgement:  Fair to po or  Insight:  Fair t poor  Reliability:  Fair to poor   MOVEMENTS  Psychomotor Activity:  Somewhat up and down  Handedness:    N/a   Strength & Muscle Tone:  deconditiong and  difficulty with cooperation  Gait & Station: needs full assistance ---PT on board  Patient leans:   Akathisia: n/a   Assets:  Caring family   Liabilities:  Poor insight reliability judgement--very poor coping and social skills --even at baseline family cannot manage hiim well   Refuses psych   ADL'S:  Needs ongoing help for now   Sleep:  Waxes and wanes   Appetite:  Fair  Calorie counts pending    Current Medications: Current Facility-Administered Medications  Medication Dose Route Frequency Provider Last Rate Last Admin  . 0.9 %  sodium chloride infusion   Intravenous PRN Swayze, Ava, DO   Stopped at 03/29/20 2141  . acetaminophen (TYLENOL) tablet 650 mg  650 mg Oral Q6H PRN Mansy, Jan A, MD   650 mg at 03/30/20 2144   Or  . acetaminophen (TYLENOL) suppository 650 mg  650 mg Rectal Q6H PRN Mansy, Jan A, MD      . benztropine (COGENTIN) tablet 1 mg  1 mg Oral BID [name], MD   1 mg at 04/01/20 0901  . Chlorhexidine Gluconate Cloth 2 % PADS 6 each  6 each Topical Q0600 Manuela Schwartz, NP   6 each at 04/01/20 0622  . [START ON 04/02/2020] clonazepam (KLONOPIN) disintegrating tablet 0.25 mg  0.25 mg Oral BID [name], MD      . dexmedetomidine [facility]) bolus via  infusion 81.6 mcg  1 mcg/kg Intravenous Once Manuela SchwartzMorrison, Brenda, NP      . diphenhydrAMINE (BENADRYL) injection 6.5 mg  6.5 mg Intravenous Q4H PRN Roselind Messierao, Marquita Lias, MD       Or  . diphenhydrAMINE (BENADRYL) capsule 50 mg  50 mg Oral Q6H PRN Roselind Messierao, Corneisha Alvi, MD      . DULoxetine (CYMBALTA) DR capsule 20 mg  20 mg Oral Daily Roselind Messierao, Islam Eichinger, MD   20 mg at 04/01/20 0901  . enoxaparin (LOVENOX) injection 40 mg  40 mg Subcutaneous Q24H Mansy, Jan A, MD   40 mg at 03/29/20 2156  . [START ON 04/02/2020] haloperidol (HALDOL) tablet 0.25 mg  0.25 mg Oral BID Roselind Messierao, Lemario Chaikin, MD      . haloperidol lactate (HALDOL) injection 1 mg  1 mg Intravenous Q6H PRN Roselind Messierao, Shanel Prazak, MD       Or  . haloperidol (HALDOL) tablet 2  mg  2 mg Oral Q6H PRN Roselind Messierao, Jaslin Novitski, MD      . hydrALAZINE (APRESOLINE) tablet 50 mg  50 mg Oral Q6H PRN Mansy, Jan A, MD      . lamoTRIgine (LAMICTAL) tablet 150 mg  150 mg Oral BID Thana Farreynolds, Leslie, MD   150 mg at 04/01/20 0903  . levETIRAcetam (KEPPRA) IVPB 1000 mg/100 mL premix  1,000 mg Intravenous Q12H Thana Farreynolds, Leslie, MD 400 mL/hr at 04/01/20 0736 1,000 mg at 04/01/20 0736  . LORazepam (ATIVAN) injection 1 mg  1 mg Intravenous Q6H PRN Roselind Messierao, Demetric Dunnaway, MD      . magnesium hydroxide (MILK OF MAGNESIA) suspension 30 mL  30 mL Oral Daily PRN Mansy, Jan A, MD      . OLANZapine (ZYPREXA) injection 10 mg  10 mg Intramuscular Q6H PRN Roselind Messierao, Roann Merk, MD   10 mg at 03/30/20 0322  . OLANZapine zydis (ZYPREXA) disintegrating tablet 7.5 mg  7.5 mg Oral QHS Roselind Messierao, Vasilisa Vore, MD      . ondansetron [facility](ZOFRAN) tablet 4 mg  4 mg Oral Q6H PRN Mansy, Jan A, MD       Or  . ondansetron [facility](ZOFRAN) injection 4 mg  4 mg Intravenous Q6H PRN Mansy, Jan A, MD      . thiamine (B-1) injection 100 mg  100 mg Intravenous Daily Swayze, Ava, DO   100 mg at 04/01/20 [id number]    Lab Results:  Results for orders placed or performed during the hospital encounter of 03/24/20 (from the past 48 hour(s))  Glucose, capillary     Status: None   Collection Time: 03/31/20  9:09 AM  Result Value Ref Range   Glucose-Capillary 93 70 - 99 mg/dL    Comment: Glucose reference range applies only to samples taken after fasting for at least 8 hours.  Blood gas, arterial     Status: None   Collection Time: 03/31/20  9:13 AM  Result Value Ref Range   FIO2 0.21    pH, Arterial 7.41 7.35 - 7.45   pCO2 arterial 35 32 - 48 mmHg   pO2, Arterial 89 83 - 108 mmHg   Bicarbonate 22.2 20.0 - 28.0 mmol/L   Acid-base deficit 2.0 0.0 - 2.0 mmol/L   O2 Saturation 96.9 %   Patient temperature 37.0    Collection site RIGHT RADIAL    Sample type ARTERIAL DRAW    Allens test (pass/fail) PASS PASS    Comment: Performed at [facility], [address] [place], [place] [id number]  Troponin I (High Sensitivity)     Status: None  Collection Time: 03/31/20  9:17 AM  Result Value Ref Range   Troponin I (High Sensitivity) 8 <18 ng/L    Comment: (NOTE) Elevated high sensitivity troponin I (hsTnI) values and significant  changes across serial measurements may suggest ACS but many other  chronic and acute conditions are known to elevate hsTnI results.  Refer to the "Links" section for chest pain algorithms and additional  guidance. Performed at [facility], [address] [place], [place] [address]   CBC with Differential/Platelet     Status: Abnormal   Collection Time: 03/31/20  9:17 AM  Result Value Ref Range   WBC 4.8 4.0 - 10.5 K/uL   RBC 3.85 (L) 4.22 - 5.81 MIL/uL   Hemoglobin 11.6 (L) 13.0 - 17.0 g/dL   HCT 60.4 (L) 39 - 52 %   MCV 88.1 80.0 - 100.0 fL   MCH 30.1 26.0 - 34.0 pg   MCHC 34.2 30.0 - 36.0 g/dL   RDW 54.0 98.1 - 19.1 %   Platelets 248 150 - 400 K/uL   nRBC 0.0 0.0 - 0.2 %   Neutrophils Relative % 65 %   Neutro Abs 3.1 1.7 - 7.7 K/uL   Lymphocytes Relative 22 %   Lymphs Abs 1.0 0.7 - 4.0 K/uL   Monocytes Relative 10 %   Monocytes Absolute 0.5 0 - 1 K/uL   Eosinophils Relative 2 %   Eosinophils Absolute 0.1 0 - 0 K/uL   Basophils Relative 1 %   Basophils Absolute 0.0 0 - 0 K/uL   Immature Granulocytes 0 %   Abs Immature Granulocytes 0.01 0.00 - 0.07 K/uL    Comment: Performed at [facility], [address] [place], [place] [address]  Comprehensive metabolic panel     Status: Abnormal   Collection Time: 03/31/20  9:17 AM  Result Value Ref Range   Sodium 142 135 - 145 mmol/L   Potassium 3.3 (L) 3.5 - 5.1 mmol/L   Chloride 109 98 - 111 mmol/L   CO2 22 22 - 32 mmol/L   Glucose, Bld 91 70 - 99 mg/dL    Comment: Glucose reference range applies only to samples taken after fasting for at least 8 hours.   BUN 18 6 - 20 mg/dL   Creatinine, Ser 5.62 0.61 - 1.24 mg/dL   Calcium 7.9  (L) 8.9 - 10.3 mg/dL   Total Protein 5.9 (L) 6.5 - 8.1 g/dL   Albumin 3.5 3.5 - 5.0 g/dL   AST 56 (H) 15 - 41 U/L   ALT 16 0 - 44 U/L   Alkaline Phosphatase 69 38 - 126 U/L   Total Bilirubin 1.0 0.3 - 1.2 mg/dL   GFR calc non Af Amer >60 >60 mL/min   GFR calc Af Amer >60 >60 mL/min   Anion gap 11 5 - 15    Comment: Performed at [facility], [address] [place], [place] [address]  Lactic acid, plasma     Status: None   Collection Time: 03/31/20  9:17 AM  Result Value Ref Range   Lactic Acid, Venous 0.6 0.5 - 1.9 mmol/L    Comment: Performed at [facility], [address] [place], [place] [address]  Culture, blood (Routine X 2) w Reflex to ID Panel     Status: None (Preliminary result)   Collection Time: 03/31/20 10:34 AM   Specimen: BLOOD  Result Value Ref Range   Specimen Description BLOOD BLOOD RIGHT ARM    Special  Requests      BOTTLES DRAWN AEROBIC ONLY Blood Culture results may not be optimal due to an inadequate volume of blood received in culture bottles   Culture      NO GROWTH < 24 HOURS Performed at [facility], [address], [place], [place] [address]    Report Status PENDING   Culture, blood (Routine X 2) w Reflex to ID Panel     Status: None (Preliminary result)   Collection Time: 03/31/20 10:41 AM   Specimen: BLOOD  Result Value Ref Range   Specimen Description BLOOD LEFT ANTECUBITAL    Special Requests      BOTTLES DRAWN AEROBIC AND ANAEROBIC Blood Culture adequate volume   Culture      NO GROWTH < 24 HOURS Performed at [facility], [address], [place], [place] [address]    Report Status PENDING   Lactic acid, plasma     Status: None   Collection Time: 03/31/20 12:22 PM  Result Value Ref Range   Lactic Acid, Venous 0.7 0.5 - 1.9 mmol/L    Comment: Performed at [facility], [address], [place], [place] [address]  Troponin I (High Sensitivity)     Status: None   Collection Time: 03/31/20  12:22 PM  Result Value Ref Range   Troponin I (High Sensitivity) 7 <18 ng/L    Comment: (NOTE) Elevated high sensitivity troponin I (hsTnI) values and significant  changes across serial measurements may suggest ACS but many other  chronic and acute conditions are known to elevate hsTnI results.  Refer to the "Links" section for chest pain algorithms and additional  guidance. Performed at [facility], [address] [place], [place] [address]   CBC     Status: Abnormal   Collection Time: 04/01/20  4:29 AM  Result Value Ref Range   WBC 6.2 4.0 - 10.5 K/uL   RBC 4.38 4.22 - 5.81 MIL/uL   Hemoglobin 12.8 (L) 13.0 - 17.0 g/dL   HCT 13.0 (L) 39 - 52 %   MCV 87.9 80.0 - 100.0 fL   MCH 29.2 26.0 - 34.0 pg   MCHC 33.2 30.0 - 36.0 g/dL   RDW 86.5 78.4 - 69.6 %   Platelets 324 150 - 400 K/uL   nRBC 0.0 0.0 - 0.2 %    Comment: Performed at [facility], [address] [place], [place] [address]  Basic metabolic panel     Status: Abnormal   Collection Time: 04/01/20  4:29 AM  Result Value Ref Range   Sodium 140 135 - 145 mmol/L   Potassium 4.0 3.5 - 5.1 mmol/L   Chloride 107 98 - 111 mmol/L   CO2 20 (L) 22 - 32 mmol/L   Glucose, Bld 78 70 - 99 mg/dL    Comment: Glucose reference range applies only to samples taken after fasting for at least 8 hours.   BUN 13 6 - 20 mg/dL   Creatinine, Ser 4.13 0.61 - 1.24 mg/dL   Calcium 8.7 (L) 8.9 - 10.3 mg/dL   GFR calc non Af Amer >60 >60 mL/min   GFR calc Af Amer >60 >60 mL/min   Anion gap 13 5 - 15    Comment: Performed at [facility], [address] [place], [place] [address]  Magnesium     Status: None   Collection Time: 04/01/20  4:29 AM  Result Value Ref Range   Magnesium 2.0 1.7 - 2.4 mg/dL  Comment: Performed at [facility], [address] [place], [place] [address]    Blood Alcohol level:  Lab Results  Component Value Date   [facility] <10 11/03/2019   ETH <5 12/05/2016    Metabolic  Disorder Labs: No results found for: HGBA1C, MPG No results found for: PROLACTIN No results found for: CHOL, TRIG, HDL, CHOLHDL, VLDL, LDLCALC   Past Psychiatric History: ---ongoing mood issues ETOH issues not treated   Past Medical/Surgical History  Past Medical History:  Diagnosis Date  . Seizures (HCC)     Past Surgical History:  Procedure Laterality Date  . SKIN GRAFT      Family Medical and Psychiatric History:   Family History  Problem Relation Age of Onset  . Seizures Mother     Social History:   Social History   Substance and Sexual Activity  Alcohol Use Yes  . Alcohol/week: 2.0 standard drinks  . Types: 2 Shots of liquor per week   Comment: "rarely"      Social History   Substance and Sexual Activity  Drug Use No    Social History   Socioeconomic History  . Marital status: Single    Spouse name: Not on file  . Number of children: Not on file  . Years of education: Not on file  . Highest education level: Not on file  Occupational History  . Not on file  Tobacco Use  . Smoking status: Current Some Day Smoker  . Smokeless tobacco: Current User  Vaping Use  . Vaping Use: Never used  Substance and Sexual Activity  . Alcohol use: Yes    Alcohol/week: 2.0 standard drinks    Types: 2 Shots of liquor per week    Comment: "rarely"   . Drug use: No  . Sexual activity: Yes  Other Topics Concern  . Not on file  Social History Narrative  . Not on file   Social Determinants of Health   Financial Resource Strain:   . Difficulty of Paying Living Expenses:   Food Insecurity:   . Worried About Programme researcher, broadcasting/film/video in the Last Year:   . Barista in the Last Year:   Transportation Needs:   . Freight forwarder (Medical):   [name] Lack of Transportation (Non-Medical):   Physical Activity:   . Days of Exercise per Week:   . Minutes of Exercise per Session:   Stress:   . Feeling of Stress :   Social Connections:   . Frequency of Communication  with Friends and Family:   . Frequency of Social Gatherings with Friends and Family:   . Attends Religious Services:   . Active Member of Clubs or Organizations:   . Attends Banker Meetings:   [name] Marital Status:                             Summary:  Caucasian male with very complex issues, currently needing full stabilization for new onset psychosis --seizures stable but MS is not stable fully however cooperative with oral meds currently     Prescription Plan:   I have reduced his oral meds to hopefully get him to wake up but not go OOC requiring sedation again   Possible transfer soon to floor   Needs much OT and PT pending   Estimated Length of Stay:  7-10 days   Wrote for am Depakote and Keppra dosing in case ---his levels  are higher -----and causing his MS changes.  Last Depakote was in range in early July    [name], MD 04/01/2020, 5:33 PM

## 2020-04-01 NOTE — Evaluation (Signed)
Physical Therapy Re-Evaluation Patient Details Name: Lance Ray MRN: [id number] DOB: June 09, 1971 Today's Date: 04/01/2020   History of Present Illness   49 y.o. male with history of seizure disorder, who has not been on his Depakote for months and presented to the emergency room with acute onset of witnessed generalized tonic-clonic seizure, apparently he was discharged and had another seizure in the parking lot and returned, PT re-eval completed in CCU (7/8)  Clinical Impression  Pt sleeping soundly on arrival, sitter and PT calling name repeatedly with shoulder/sternal rubs eventually did wake him. Overall he was very unsteady, impulsive and showed poor motor planning or ability to meaningfully process cues (verbal and physical).  He struggled to maintain balance at EOB and on multiple standing attempts (all with walker) he simply could not get his weight forward onto toes/walker to be able to maintain standing w/o significant assist to keep hips/weight forward enough to keep from falling straight back onto the bed.  HR ~100 on rest, increased to 120s with sitting and into the 140s with highly assist brief bouts of standing.  Pt very unsafe and unsteady at this time, per transition to behavior med or other plan he likely will need some form of rehab/supervision at discharge.      Follow Up Recommendations  (per progress likely w/ transition to behavioral unit pre-D/C)    Equipment Recommendations   (per progress, may need walker)    Recommendations for Other Services       Precautions / Restrictions Precautions Precautions: Fall Restrictions Weight Bearing Restrictions: No      Mobility  Bed Mobility Overal bed mobility: Needs Assistance Bed Mobility: Supine to Sit;Sit to Supine     Supine to sit: Min assist Sit to supine: Mod assist   General bed mobility comments: Pt struggled to get to EOB even with hand held assist; more concerningly, however, was his inability to  consistently maintain sitting balance with multiple repeated LOBs backward onto the bed - needed constant hands on assist to maintain  Transfers Overall transfer level: Needs assistance Equipment used: Rolling walker (2 wheeled) Transfers: Sit to/from Stand Sit to Stand: Max assist         General transfer comment: extremely poor awareness with backward lean, lifting walker, inability to shift weight forward and ultimately needed max assist to get to near standing position with unability to get weight over walker despite multiple attempts and heavy cuing (verbal and phyiscal)  Ambulation/Gait             General Gait Details: unsafe to attempt this date.  Unable to use walker appropriately, poor ability to follow instructions/make adjustments and ataxic body movements and tremoring with the effort.  Stairs            Wheelchair Mobility    Modified Rankin (Stroke Patients Only)       Balance   Sitting-balance support: Bilateral upper extremity supported;Feet supported Sitting balance-Leahy Scale: Poor     Standing balance support: Bilateral upper extremity supported;During functional activity Standing balance-Leahy Scale: Zero                               Pertinent Vitals/Pain Pain Assessment: No/denies pain    Home Living Family/patient expects to be discharged to:: Unsure Living Arrangements: Alone Available Help at Discharge: Family;Friend(s);Available PRN/intermittently (sister) Type of Home: House Home Access: Stairs to enter   [employer] of Steps: 1 Home Layout:  One level   Additional Comments: information gathered through prior documentation, pt with minimal ability to form cogent sentences this date    Prior Function Level of Independence: Independent         Comments: indicates both that he does most of the groceries/errands and alternatively that family helps     Hand Dominance        Extremity/Trunk  Assessment   Upper Extremity Assessment Upper Extremity Assessment: Difficult to assess due to impaired cognition (able to move UEs t/o functional range, not formally tested)    Lower Extremity Assessment Lower Extremity Assessment: Difficult to assess due to impaired cognition (able to move LEs t/o functional range, not formally tested)       Communication   Communication:  (mumbled and often incomplete vocalizations)  Cognition Arousal/Alertness: Lethargic Behavior During Therapy: Restless Overall Cognitive Status: Difficult to assess                                 General Comments: pt continues to struggle with executive function and verbalizations      General Comments      Exercises     Assessment/Plan    PT Assessment Patient needs continued PT services  PT Problem List Decreased strength;Decreased range of motion;Decreased activity tolerance;Decreased balance;Decreased mobility;Decreased coordination;Decreased cognition;Decreased knowledge of use of DME;Decreased safety awareness;Decreased knowledge of precautions;Decreased skin integrity       PT Treatment Interventions Gait training;Stair training;DME instruction;Functional mobility training;Therapeutic activities;Therapeutic exercise;Balance training;Neuromuscular re-education;Patient/family education    PT Goals (Current goals can be found in the Care Plan section)  Acute Rehab PT Goals Patient Stated Goal: none stated PT Goal Formulation: Patient unable to participate in goal setting Time For Goal Achievement: 04/15/20 Potential to Achieve Goals: Fair    Frequency Min 2X/week   Barriers to discharge        Co-evaluation               AM-PAC PT "6 Clicks" Mobility  Outcome Measure Help needed turning from your back to your side while in a flat bed without using bedrails?: A Lot Help needed moving from lying on your back to sitting on the side of a flat bed without using bedrails?: A  Lot Help needed moving to and from a bed to a chair (including a wheelchair)?: Total Help needed standing up from a chair using your arms (e.g., wheelchair or bedside chair)?: Total Help needed to walk in hospital room?: Total Help needed climbing 3-5 steps with a railing? : Total 6 Click Score: 8    End of Session Equipment Utilized During Treatment: Gait belt Activity Tolerance: Patient limited by fatigue Patient left: in bed;with nursing/sitter in room Nurse Communication: Mobility status PT Visit Diagnosis: Unsteadiness on feet (R26.81);Muscle weakness (generalized) (M62.81);Other abnormalities of gait and mobility (R26.89);Ataxic gait (R26.0)    Time: 1453-1520 PT Time Calculation (min) (ACUTE ONLY): 27 min   Charges:   PT Evaluation $PT Re-evaluation: 1 Re-eval PT Treatments $Therapeutic Activity: 8-22 mins        Malachi Pro, DPT 04/01/2020, 4:53 PM

## 2020-04-02 LAB — BASIC METABOLIC PANEL
Anion gap: 11 (ref 5–15)
BUN: 13 mg/dL (ref 6–20)
CO2: 25 mmol/L (ref 22–32)
Calcium: 9 mg/dL (ref 8.9–10.3)
Chloride: 104 mmol/L (ref 98–111)
Creatinine, Ser: 0.97 mg/dL (ref 0.61–1.24)
GFR calc Af Amer: >60 mL/min (ref >60)
GFR calc non Af Amer: >60 mL/min (ref >60)
Glucose, Bld: 74 mg/dL (ref 70–99)
Potassium: 3.6 mmol/L (ref 3.5–5.1)
Sodium: 140 mmol/L (ref 135–145)

## 2020-04-02 LAB — CBC
HCT: 39.1 % (ref 39.0–52.0)
Hemoglobin: 13.1 g/dL (ref 13.0–17.0)
MCH: 29.4 pg (ref 26.0–34.0)
MCHC: 33.5 g/dL (ref 30.0–36.0)
MCV: 87.9 fL (ref 80.0–100.0)
Platelets: 352 10*3/uL (ref 150–400)
RBC: 4.45 MIL/uL (ref 4.22–5.81)
RDW: 14.3 % (ref 11.5–15.5)
WBC: 5.8 10*3/uL (ref 4.0–10.5)
nRBC: 0 % (ref 0.0–0.2)

## 2020-04-02 LAB — VITAMIN B1: Vitamin B1 (Thiamine): 78.9 nmol/L (ref 66.5–200.0)

## 2020-04-02 LAB — VALPROIC ACID LEVEL: Valproic Acid Lvl: <10 ug/mL — ABNORMAL LOW (ref 50.0–100.0)

## 2020-04-02 NOTE — TOC Progression Note (Addendum)
Transition of Care [facility]) - Progression Note    Patient Details  Name: ORBIN MAYEUX MRN: [id number] Date of Birth: November 07, 1970  Transition of [facility] CM/SW Contact  Colton Cellar, RN Phone Number: 04/02/2020, 4:11 PM  Clinical Narrative:    This Clinical research associate was contacted by staff to follow up on patients pet cat being cared for while he is in the hospital. RN CM contacted and left a voicemail for patient's sister Beryle Quant [phone].  This CSW called patient sister Gloris Manchester [phone], unable to leave message due to full voicemail.  Received callback from [facility] confirming family is caring for the cat. RN CM will request staff RN update patient.    Expected Discharge Plan: Home/Self Care Barriers to Discharge: Continued Medical Work up  Expected Discharge Plan and Services Expected Discharge Plan: Home/Self Care   Discharge Planning Services: CM Consult, Medication Assistance, Indigent Health Clinic   Living arrangements for the past 2 months: Single Family Home                                       Social Determinants of Health (SDOH) Interventions    Readmission Risk Interventions Readmission Risk Prevention Plan 03/29/2020  Transportation Screening Complete  HRI or Home Care Consult Complete  Social Work Consult for Recovery Care Planning/Counseling Complete  Palliative Care Screening Not Applicable  Medication Review Oceanographer) Referral to Pharmacy  Some recent data might be hidden

## 2020-04-02 NOTE — TOC Initial Note (Signed)
Transition of Care [facility]) - Initial/Assessment Note    Patient Details  Name: Lance Ray MRN: [id number] Date of Birth: 07-Apr-1971  Transition of Care [facility]) CM/SW Contact:    Marina Goodell Phone Number: [phone] 04/02/2020, 12:24 PM  Clinical Narrative:                  Patient currently IVC.  This CSW received a call from [facility].  Patient expressed concerns about his cat not being feed and wanted someone to help with this issue.  This CSW contacted and left a voicemail for patient's sister Beryle Quant [phone].  This CSW called patient sister Gloris Manchester [phone], unable to leave message due to full voicemail.  Expected Discharge Plan: Home/Self Care Barriers to Discharge: Continued Medical Work up   Patient Goals and CMS Choice Patient states their goals for this hospitalization and ongoing recovery are:: To get out of here      Expected Discharge Plan and Services Expected Discharge Plan: Home/Self Care   Discharge Planning Services: CM Consult, Medication Assistance, Indigent Health Clinic   Living arrangements for the past 2 months: Single Family Home                                      Prior Living Arrangements/Services Living arrangements for the past 2 months: Single Family Home Lives with:: Self Patient language and need for interpreter reviewed:: Yes Do you feel safe going back to the place where you live?: Yes      Need for Family Participation in Patient Care: Yes (Comment) (non compliant, depressed) Care giver support system in place?: Yes (comment) (sisters)   Criminal Activity/Legal Involvement Pertinent to Current Situation/Hospitalization: No - Comment as needed  Activities of Daily Living Home Assistive Devices/Equipment: Eyeglasses ADL Screening (condition at time of admission) Patient's cognitive ability adequate to safely complete daily activities?: Yes Is the patient deaf or have difficulty hearing?:  No Does the patient have difficulty seeing, even when wearing glasses/contacts?: No Does the patient have difficulty concentrating, remembering, or making decisions?: No Patient able to express need for assistance with ADLs?: Yes Does the patient have difficulty dressing or bathing?: No Independently performs ADLs?: Yes (appropriate for developmental age) Does the patient have difficulty walking or climbing stairs?: No Weakness of Legs: None Weakness of Arms/Hands: None  Permission Sought/Granted Permission sought to share information with : Case Manager, Family Supports Permission granted to share information with : Yes, Verbal Permission Granted  Share Information with NAME: [name] and Aram Beecham     Permission granted to share info w Relationship: sisters     Emotional Assessment Appearance:: Appears older than stated age Attitude/Demeanor/Rapport: Angry, Uncooperative Affect (typically observed): Agitated, Angry, Depressed Orientation: : Oriented to Self, Oriented to Place, Oriented to  Time, Oriented to Situation Alcohol / Substance Use: Alcohol Use Psych Involvement: No (comment)  Admission diagnosis:  Seizure (HCC) [R56.9] Recurrent seizures (HCC) [G40.909] Patient Active Problem List   Diagnosis Date Noted  . Seizure (HCC) 03/25/2020  . Recurrent seizures (HCC) 03/24/2020   PCP:  Patient, No Pcp Per Pharmacy:   [facility], [place] - [address] [place] [address] Phone: [phone] Fax: [phone]  [id number] - [place], [place] - [address] [place] [address] Phone: [phone] Fax: [phone]  [facility] [place], [place] [address] [place] [address] Phone: [phone] Fax: [phone]     Social Determinants of Health (SDOH) Interventions    Readmission Risk Interventions Readmission Risk Prevention Plan 03/29/2020   Transportation Screening Complete  HRI or Home Care Consult Complete  Social Work Consult for Recovery Care Planning/Counseling Complete  Palliative Care Screening Not Applicable  Medication Review Oceanographer) Referral to Pharmacy  Some recent data might be hidden

## 2020-04-02 NOTE — Evaluation (Addendum)
Occupational Therapy Re-Evaluation Patient Details Name: Lance Ray MRN: [id number] DOB: 12/12/1970 Today's Date: 04/02/2020    History of Present Illness 49 y.o. male with history of seizure disorder, who has not been on his Depakote for months and presented to the emergency room with acute onset of witnessed generalized tonic-clonic seizure, apparently he was discharged and had another seizure in the parking lot and returned, OT re-eval completed in CCU (7/9)   Clinical Impression   Pt seen for therapy re-evaluation this date following transition to CCU this week 2/2 agitation/combativeness and increased HR.  Pt requires MOD A with bed mobility, demos POOR sitting and standing balance, and requires MOD/MAX A with transfer using arm-in-arm technique. Pt somewhat more lucid today with 80% success following simple one-step commands. OT faciltiates pt participation in UB/LB ADLs with pt requiring MIN A with most aspects of UB self care including self-feeding (undershoots mouth when attempting to eat banana) and MOD A with LB ADLs in sitting (anticipate more assist req'ed for standing LB ADLs). Upon pt's transition from acute care setting, anticipate he will need continued occupational therapy service follow-up to improve sel- care efficiency and safety to highest attainable level. Will continue to follow with OT services while pt in acute setting. Of note: Pt's HR briefly to 130s with attempts to stand, primarily 98-112bpm t/o session with transition to sitting and during sitting activity.     Follow Up Recommendations  Other (comment) (pending progress likely w/ transition to behavioral unit pre-D/C)    Equipment Recommendations  Other (comment) (TBD pending progress)    Recommendations for Other Services       Precautions / Restrictions Precautions Precautions: Fall Restrictions Weight Bearing Restrictions: No      Mobility Bed Mobility Overal bed mobility: Needs Assistance Bed  Mobility: Supine to Sit;Sit to Supine     Supine to sit: Mod assist;HOB elevated Sit to supine: Mod assist   General bed mobility comments: Pt very sleepy, but wanting to get OOB. requires MOD A for sup to sit from awkward sleeping positiong (sitter reports that pt has been all over the bed). Becomes more awake once sup to sit completed. More lucid/talkative and participatory when sitting up.  Transfers Overall transfer level: Needs assistance Equipment used: Rolling walker (2 wheeled) Transfers: Sit to/from Stand Sit to Stand: Mod assist;Max assist         General transfer comment: arm in arm technique with MIN verbal/tactile cues for hand foor placement to complete sit to stand from EOB. Pt makes good effort.    Balance Overall balance assessment: Needs assistance Sitting-balance support: Bilateral upper extremity supported;Feet supported Sitting balance-Leahy Scale: Poor Sitting balance - Comments: Pt with some short bouts of being able to hold static sit with B UE support and no external assist (3 bouts at ~5-10 secs). But otherwise requires MIN A to sustain static sitting. Some intermittent periods of eye closing/drowsiness requiring verbal cues for pt to become more participatory in sustaining sitting.   Standing balance support: Bilateral upper extremity supported;During functional activity Standing balance-Leahy Scale: Poor Standing balance comment: Pt makes effort to contribute to stand and demos improved cue following. Requires MOD/MAX A with arm in arm to sustain static standing balance. Heavy reliance of UE support from therapist.                           ADL either performed or assessed with clinical judgement   ADL Overall ADL's :  Needs assistance/impaired                                       General ADL Comments: MIN A for UB ADLs including self feeding at bed level with HOB elevated. MOD A for LB ADLs including dressing in sitting to  don socks.     Vision   Additional Comments: difficult to formally assess d/t cognitive level-difficulty sequencing higher level commands. Appears to track appropriately when eyes open. Moderate visual attention throughout session.     Perception     Praxis      Pertinent Vitals/Pain Pain Assessment: No/denies pain     Hand Dominance Right   Extremity/Trunk Assessment Upper Extremity Assessment Upper Extremity Assessment: RUE deficits/detail;LUE deficits/detail;Generalized weakness RUE Deficits / Details: ROM WFL, grossly 4-/5, he is better able to follow commands for formal MMT assessment this date. LUE Deficits / Details: ROM WFL, grossly 4-/5, he is better able to follow commands for formal MMT assessment this date.   Lower Extremity Assessment Lower Extremity Assessment: Defer to PT evaluation;Generalized weakness   Cervical / Trunk Assessment Cervical / Trunk Assessment: Normal   Communication Communication Communication: Other (comment) (garbled, becomes clearer as pt sits up and becomes more awke/lucid)   Cognition Arousal/Alertness: Lethargic Behavior During Therapy: Restless Overall Cognitive Status: Difficult to assess (garbled speech)                                 General Comments: Pt appears somewhat more clear mentally today. He is able to state that he's at a [facility] hospital and when told he's in [place]-states "Oh yeah, [facility]". Pt able to correctly state year and needs one cue for month (states "june" initailly). He is able to follow simple one step commands ~80% of the time.   General Comments       Exercises Other Exercises Other Exercises: OT facilitates some education with pt re: importance of OOB activity including improving tolerance, indep and strength as it pertains to safely performing ADLs. Pt with MIN/MOD understanding demonstrated. Other Exercises: pt concerned for his cat at home, some intermittent bouts of crying  throughout session requiring therapeutic listening and calming strategies for which pt is very participatory and receptive. This Thereasa Parkin attempts to contact his sister re: cat to no avail. In addition, this Thereasa Parkin reports pt concerns to nurse and LCSW on pt's case today.   Shoulder Instructions      Home Living Family/patient expects to be discharged to:: Unsure Living Arrangements: Alone Available Help at Discharge: Family;Friend(s);Available PRN/intermittently Type of Home: House Home Access: Stairs to enter [employer] of Steps: 1   Home Layout: One level     Bathroom Shower/Tub: Tub/shower unit             Additional Comments: PLOF and home setup information primarily gathered from previous documentation. Pt able to supplement some. He is somewhat lucid with garbled speech, but efficacy is questionable.      Prior Functioning/Environment Level of Independence: Independent        Comments: Pt states that he lives alone and does everything himself. States he does not drive. States he walks to [employer] for food from his house.        OT Problem List: Decreased strength;Decreased activity tolerance;Impaired balance (sitting and/or standing);Decreased coordination;Decreased cognition;Decreased safety awareness;Decreased knowledge  of use of DME or AE      OT Treatment/Interventions: Self-care/ADL training;Therapeutic exercise;Energy conservation;DME and/or AE instruction;Therapeutic activities;Cognitive remediation/compensation;Balance training;Patient/family education    OT Goals(Current goals can be found in the care plan section) Acute Rehab OT Goals Patient Stated Goal: to go home to his cat OT Goal Formulation: With patient Time For Goal Achievement: 04/16/20 Potential to Achieve Goals: Fair  OT Frequency: Min 2X/week   Barriers to D/C: Decreased caregiver support          Co-evaluation              AM-PAC OT "6 Clicks" Daily Activity      Outcome Measure Help from another person eating meals?: A Little Help from another person taking care of personal grooming?: A Little Help from another person toileting, which includes using toliet, bedpan, or urinal?: A Lot Help from another person bathing (including washing, rinsing, drying)?: A Lot Help from another person to put on and taking off regular upper body clothing?: A Little Help from another person to put on and taking off regular lower body clothing?: A Lot 6 Click Score: 15   End of Session Equipment Utilized During Treatment: Gait belt Nurse Communication: Mobility status  Activity Tolerance: Patient tolerated treatment well Patient left: in bed;with call bell/phone within reach;with bed alarm set;with nursing/sitter in room  OT Visit Diagnosis: Unsteadiness on feet (R26.81);Ataxia, unspecified (R27.0)                Time: 9323-5573 OT Time Calculation (min): 53 min Charges:  OT General Charges $OT Visit: 1 Visit OT Evaluation $OT Re-Eval: 1  OT Treatments $Self Care/Home Management : 8-22 mins $Therapeutic Activity: 8-22 mins  Rejeana Brock, MS, OTR/L ascom [phone] 04/02/20, 2:05 PM

## 2020-04-02 NOTE — Progress Notes (Addendum)
PROGRESS NOTE    Lance Ray    Code Status: Full Code  XBD:[id number] DOB: 01/18/1971 DOA: 03/24/2020 LOS: 8 days  PCP: Patient, No Pcp Per CC:  Chief Complaint  Patient presents with  . Seizures       Hospital Summary   [name], who has not been on his Depakote for months and presented to the emergency room with acute onset of witnessedgeneralized tonic-clonicseizure at [facility]. He was seen earlier in the ER, observed in the ER for 5 hours without recurrent seizure activityand given a prescription for Depakote ERand dischargedin a good Rx coupon. He had a recurrent tonic-clonicseizure in the parking lot that brought him back to the ER.He denied any tongue bites. He was fairly somnolent during my interview after being given IV Ativan and Keppra but was arousable. He denied any headache or dizziness or blurred vision. No paresthesias or focal muscle weakness. He stated that he has not taken his Depakote for 6 months and Lamictal for 2 weeks. No chest pain or dyspnea or cough or wheezing. No nausea or vomiting or abdominal pain. No fever or chills.  Upon presentation to the emergency room, blood pressure was 141/88 with otherwise normal vital signs. Earlier it was 128/89. Labs reviewed potassium of 3.6 with otherwise normal BMP and CBC was within normal. Noncontrasted head CT scan revealed no acute intracranial abnormalities.EKG showed sinus tachycardia with a rate of 115 with probable left atrial enlargement.  The patient was given 1 mg of IV lorazepam and was loaded with 1 g of IV Keppra. He was admitted to an observation PCU bed for further evaluation and management.  [facility] were consulted to admit the patient for further evaluation and treatment. The patient had been taking depakote for many years according to his sister. She stated that she did not think that it  worked well. She understood that when the patient presented to [facility] with a seizure a while ago that was why they had loaded the patient with keppra and started him on that. According to the sister the pattern is that the patient will be seen at a hospital ED after a seizure. He will be set up with one month of meds, and then he will stop taking his meds when he runs out. He will not act in his own self interest to maintain his health. They have looked into getting medicaid for him, but the patient is not cooperative with the process.  Psychiatry has been consulted to evaluate the patient's depression. Transitions of Care has been consulted to help set the patient up with outpatient mental health clinic and to help the patient to set up an ongoing source of antiepileptics.  On the morning of  the patient is complaining of dysarthria and difficulty walking. Per PT who is in the room, the patient is ataxic. I observe him trying to get back in bed. He is very unsteady on his feet. CT and MRI performed and were negative for acute pathology.  Later on 03/27/2020, after he had been seen and cleared by psychiatry, he required a sitter, because he was found by nursing standing in the middle of his bed "trying to fly like superman". Psychiatry asked to return to re-evaluate the patient. [name] came to re-evaluate the patient and put him on low dose antipsychotics and anxiolytics.   On 03/29/2020 hospitalist awas called to the patient's room regarding a security alert. The patient  had tried to leave and had become combative. He was treated with 1 mg ativan and 5 mg haldol IM. The patient was requiring physical restraint by nine healthcare workers and Catering manager. He claimed that he was being taken away for a "treatment" which was not the case. His sister arrived and attempted to talk to the patient and get him to calm down, but she was unsuccessful. The patient was very combative and violent with personnel  swinging and kicking at them. In my opinion, if this patient is allowed to leave the premises in his current state he is very likely to come to harm due to his psychotic and delusional state. He is also likely to cause harm to others. CM [name] said that I could not IVC the patient unless he said that he was going to kill himself, although he is clearly psychotic. She stated that only [name] could IVC the patient. [name] was called to the floor. I discussed the patient with him. He did IVC the patient pending stabilization.  The patient was transferred to the ICU last night after another outburst requiring a security alert. He is now on a precedex drip. The patient's sisters are working to obtain guardianship of the patient. Although it took precedex ultimately to calm the patient down and address his tachycardia and hypertension, I had not considered ETOH withdrawal due to the patient given history that he drank 2 beers and to shots of hard liquor a week. This may not have been accurate.    A & P   Active Problems:   Recurrent seizures (HCC)   Seizure (HCC)   1. Seizures likely secondary to medication noncompliance a. Continue lamictal, keppra  b. TOC team on board   2. Acute encephalopathy/agitation, likely multifactorial: medication induced, seizures, possible alcohol withdrawal, underlying psychiatric issues a. CT brain and MRI brain unremarkable b. Off precedex c. Continue thiamine d. Required Haldol 1 mg IV last night for agitation and now sleeping e. Appreciate psychiatry managing antipsychotic meds f. Plan for inpatient psych once medically clear and cleared by PT?  3. Alcohol withdrawal a. Doing well off precedex and on current therapies  4. Depression a. Psychiatry on board, appreciate assistance  5. Hypertension, stable  6. Hypokalemia, resolved  7. SIRS, resolved  a. Probably from poor PO intake and medication induced b. Blood cultures no growth to date   DVT  prophylaxis: enoxaparin (LOVENOX) injection 40 mg Start: 03/24/20 2315   Family Communication: no family at bedside  Disposition Plan: transfer to med surg Status is: Inpatient  Remains inpatient appropriate because:IV treatments appropriate due to intensity of illness or inability to take PO and Inpatient level of care appropriate due to severity of illness   Dispo: The patient is from: Home              Anticipated d/c is to: TBD              Anticipated d/c date is: > 3 days              Patient currently is not medically stable to d/c.          Pressure injury documentation    None  Consultants  Psych neuro  Procedures  None  Antibiotics   Anti-infectives (From admission, onward)   None        Subjective   1:1 sitter at bedside, patient fast asleep. Arouses to verbal stimuli but falls back to sleep  Objective  Vitals:   04/02/20 0300 04/02/20 0400 04/02/20 0500 04/02/20 1200  BP:  129/85    Pulse: 89 69 75   Resp: 14 12 14    Temp:    98.4 F (36.9 C)  TempSrc:    Oral  SpO2: 100% 100% 100%   Weight:      Height:        Intake/Output Summary (Last 24 hours) at 04/02/2020 1320 Last data filed at 04/01/2020 2000 Gross per 24 hour  Intake 343.2 ml  Output --  Net 343.2 ml   Filed Weights   03/24/20 2220  Weight: 81.6 kg    Examination:  Physical Exam Vitals and nursing note reviewed. Exam conducted with a chaperone present.  Constitutional:      Appearance: He is not ill-appearing.     Comments: sleeping  HENT:     Head: Normocephalic.     Mouth/Throat:     Mouth: Mucous membranes are moist.  Eyes:     Conjunctiva/sclera: Conjunctivae normal.     Pupils: Pupils are equal, round, and reactive to light.  Cardiovascular:     Rate and Rhythm: Normal rate and regular rhythm.  Pulmonary:     Effort: Pulmonary effort is normal.     Breath sounds: Normal breath sounds.  Musculoskeletal:     Right lower leg: No edema.     Left lower  leg: No edema.  Skin:    Coloration: Skin is not jaundiced or pale.     Data Reviewed: I have personally reviewed following labs and imaging studies  CBC: Recent Labs  Lab 03/28/20 0605 03/31/20 0917 04/01/20 0429 04/02/20 0426  WBC 4.2 4.8 6.2 5.8  NEUTROABS  --  3.1  --   --   HGB 11.1* 11.6* 12.8* 13.1  HCT 32.6* 33.9* 38.5* 39.1  MCV 87.6 88.1 87.9 87.9  PLT 213 248 324 352   Basic Metabolic Panel: Recent Labs  Lab 03/28/20 0605 03/29/20 0719 03/31/20 0917 04/01/20 0429 04/02/20 0426  NA 143 141 142 140 140  K 3.4* 4.2 3.3* 4.0 3.6  CL 111 107 109 107 104  CO2 28 26 22  20* 25  GLUCOSE 92 81 91 78 74  BUN 6 8 18 13 13   CREATININE 0.75 1.05 0.96 0.98 0.97  CALCIUM 8.1* 8.8* 7.9* 8.7* 9.0  MG  --   --   --  2.0  --    GFR: Estimated Creatinine Clearance: 92.1 mL/min (by C-G formula based on SCr of 0.97 mg/dL). Liver Function Tests: Recent Labs  Lab 03/31/20 0917  AST 56*  ALT 16  ALKPHOS 69  BILITOT 1.0  PROT 5.9*  ALBUMIN 3.5   No results for input(s): LIPASE, AMYLASE in the last 168 hours. No results for input(s): AMMONIA in the last 168 hours. Coagulation Profile: No results for input(s): INR, PROTIME in the last 168 hours. Cardiac Enzymes: No results for input(s): CKTOTAL, CKMB, CKMBINDEX, TROPONINI in the last 168 hours. BNP (last 3 results) No results for input(s): PROBNP in the last 8760 hours. HbA1C: No results for input(s): HGBA1C in the last 72 hours. CBG: Recent Labs  Lab 03/31/20 0909  GLUCAP 93   Lipid Profile: No results for input(s): CHOL, HDL, LDLCALC, TRIG, CHOLHDL, LDLDIRECT in the last 72 hours. Thyroid Function Tests: No results for input(s): TSH, T4TOTAL, FREET4, T3FREE, THYROIDAB in the last 72 hours. Anemia Panel: No results for input(s): VITAMINB12, FOLATE, FERRITIN, TIBC, IRON, RETICCTPCT in the last 72 hours. Sepsis Labs: Recent  Labs  Lab 03/31/20 0917 03/31/20 1222  LATICACIDVEN 0.6 0.7    Recent Results  (from the past 240 hour(s))  SARS Coronavirus 2 by RT PCR (hospital order, performed in [facility] hospital lab) Nasopharyngeal Nasopharyngeal Swab     Status: None   Collection Time: 03/25/20  4:13 PM   Specimen: Nasopharyngeal Swab  Result Value Ref Range Status   SARS Coronavirus 2 NEGATIVE NEGATIVE Final    Comment: (NOTE) SARS-CoV-2 target nucleic acids are NOT DETECTED.  The SARS-CoV-2 RNA is generally detectable in upper and lower respiratory specimens during the acute phase of infection. The lowest concentration of SARS-CoV-2 viral copies this assay can detect is 250 copies / mL. A negative result does not preclude SARS-CoV-2 infection and should not be used as the sole basis for treatment or other patient management decisions.  A negative result may occur with improper specimen collection / handling, submission of specimen other than nasopharyngeal swab, presence of viral mutation(s) within the areas targeted by this assay, and inadequate number of viral copies (<250 copies / mL). A negative result must be combined with clinical observations, patient history, and epidemiological information.  Fact Sheet for Patients:   BoilerBrush.com.cy  Fact Sheet for Healthcare Providers: https://pope.com/  This test is not yet approved or  cleared by the [place] FDA and has been authorized for detection and/or diagnosis of SARS-CoV-2 by FDA under an Emergency Use Authorization (EUA).  This EUA will remain in effect (meaning this test can be used) for the duration of the COVID-19 declaration under Section 564(b)(1) of the Act, 21 U.S.C. section 360bbb-3(b)(1), unless the authorization is terminated or revoked sooner.  Performed at [facility], [address] [place], [place] [address]   Culture, blood (Routine X 2) w Reflex to ID Panel     Status: None (Preliminary result)   Collection Time: 03/31/20 10:34 AM    Specimen: BLOOD  Result Value Ref Range Status   Specimen Description BLOOD BLOOD RIGHT ARM  Final   Special Requests   Final    BOTTLES DRAWN AEROBIC ONLY Blood Culture results may not be optimal due to an inadequate volume of blood received in culture bottles   Culture   Final    NO GROWTH 2 DAYS Performed at [facility], [address], [place], [place] [address]    Report Status PENDING  Incomplete  Culture, blood (Routine X 2) w Reflex to ID Panel     Status: None (Preliminary result)   Collection Time: 03/31/20 10:41 AM   Specimen: BLOOD  Result Value Ref Range Status   Specimen Description BLOOD LEFT ANTECUBITAL  Final   Special Requests   Final    BOTTLES DRAWN AEROBIC AND ANAEROBIC Blood Culture adequate volume   Culture   Final    NO GROWTH 2 DAYS Performed at [facility], [address] [place], [place] [address]    Report Status PENDING  Incomplete         Radiology Studies: No results found.      Scheduled Meds: . benztropine  1 mg Oral BID  . Chlorhexidine Gluconate Cloth  6 each Topical Q0600  . clonazePAM  0.25 mg Oral BID  . dexmedetomidine  1 mcg/kg Intravenous Once  . DULoxetine  20 mg Oral Daily  . enoxaparin (LOVENOX) injection  40 mg Subcutaneous Q24H  . haloperidol  0.25 mg Oral BID  . lamoTRIgine  150 mg Oral BID  . OLANZapine zydis  7.5  mg Oral QHS  . thiamine injection  100 mg Intravenous Daily   Continuous Infusions: . sodium chloride Stopped (03/29/20 2141)  . levETIRAcetam 1,000 mg (04/02/20 0831)     Time spent: 32 minutes with over 50% of the time coordinating the patient's care    Jae Dire, DO [facility] Pager [phone]  Call night coverage person covering after 7pm

## 2020-04-03 LAB — AMMONIA: Ammonia: 28 umol/L (ref 9–35)

## 2020-04-03 MED ORDER — LEVETIRACETAM 500 MG PO TABS
1000.0000 mg | ORAL_TABLET | Freq: Two times a day (BID) | ORAL | Status: DC
  Administered 2020-04-03 – 2020-04-09 (×12): 1000 mg via ORAL
  Filled 2020-04-03 (×13): qty 2

## 2020-04-03 MED ORDER — LEVETIRACETAM 500 MG PO TABS: 1000.0000 mg | ORAL_TABLET | Freq: Two times a day (BID) | ORAL | Status: DC

## 2020-04-03 NOTE — Progress Notes (Addendum)
PROGRESS NOTE    Lance Ray    Code Status: Full Code  ZOX:[id number] DOB: 1970/09/27 DOA: 03/24/2020 LOS: 9 days  PCP: Patient, No Pcp Per CC:  Chief Complaint  Patient presents with  . Seizures       Hospital Summary   [name], who has not been on his Depakote for months and presented to the emergency room with acute onset of witnessedgeneralized tonic-clonicseizure at [facility]. He was seen earlier in the ER, observed in the ER for 5 hours without recurrent seizure activityand given a prescription for Depakote ERand dischargedin a good Rx coupon. He had a recurrent tonic-clonicseizure in the parking lot that brought him back to the ER.He denied any tongue bites. He was fairly somnolent during my interview after being given IV Ativan and Keppra but was arousable. He denied any headache or dizziness or blurred vision. No paresthesias or focal muscle weakness. He stated that he has not taken his Depakote for 6 months and Lamictal for 2 weeks. No chest pain or dyspnea or cough or wheezing. No nausea or vomiting or abdominal pain. No fever or chills.  Upon presentation to the emergency room, blood pressure was 141/88 with otherwise normal vital signs. Earlier it was 128/89. Labs reviewed potassium of 3.6 with otherwise normal BMP and CBC was within normal. Noncontrasted head CT scan revealed no acute intracranial abnormalities.EKG showed sinus tachycardia with a rate of 115 with probable left atrial enlargement.  The patient was given 1 mg of IV lorazepam and was loaded with 1 g of IV Keppra. He was admitted to an observation PCU bed for further evaluation and management.  [facility] were consulted to admit the patient for further evaluation and treatment. The patient had been taking depakote for many years according to his sister. She stated that she did not think that it  worked well. She understood that when the patient presented to [facility] with a seizure a while ago that was why they had loaded the patient with keppra and started him on that. According to the sister the pattern is that the patient will be seen at a hospital ED after a seizure. He will be set up with one month of meds, and then he will stop taking his meds when he runs out. He will not act in his own self interest to maintain his health. They have looked into getting medicaid for him, but the patient is not cooperative with the process.  Psychiatry has been consulted to evaluate the patient's depression. Transitions of Care has been consulted to help set the patient up with outpatient mental health clinic and to help the patient to set up an ongoing source of antiepileptics.  On the morning of  the patient is complaining of dysarthria and difficulty walking. Per PT who is in the room, the patient is ataxic. I observe him trying to get back in bed. He is very unsteady on his feet. CT and MRI performed and were negative for acute pathology.  Later on 03/27/2020, after he had been seen and cleared by psychiatry, he required a sitter, because he was found by nursing standing in the middle of his bed "trying to fly like superman". Psychiatry asked to return to re-evaluate the patient. Lance Ray came to re-evaluate the patient and put him on low dose antipsychotics and anxiolytics.   On 03/29/2020 hospitalist awas called to the patient's room regarding a security alert. The patient  had tried to leave and had become combative. He was treated with 1 mg ativan and 5 mg haldol IM. The patient was requiring physical restraint by nine healthcare workers and Catering manager. He claimed that he was being taken away for a "treatment" which was not the case. His sister arrived and attempted to talk to the patient and get him to calm down, but she was unsuccessful. The patient was very combative and violent with personnel  swinging and kicking at them. In my opinion, if this patient is allowed to leave the premises in his current state he is very likely to come to harm due to his psychotic and delusional state. He is also likely to cause harm to others. CM Lance Ray said that I could not IVC the patient unless he said that he was going to kill himself, although he is clearly psychotic. She stated that only Lance Ray could IVC the patient. Lance Ray was called to the floor. I discussed the patient with him. He did IVC the patient pending stabilization.  The patient was transferred to the ICU last night after another outburst requiring a security alert. He is now on a precedex drip. The patient's sisters are working to obtain guardianship of the patient. Although it took precedex ultimately to calm the patient down and address his tachycardia and hypertension, I had not considered ETOH withdrawal due to the patient given history that he drank 2 beers and to shots of hard liquor a week. This may not have been accurate.  7/9: transfer out of SDU  A & P   Active Problems:   Recurrent seizures (HCC)   Seizure (HCC)   1. Seizures likely secondary to medication noncompliance a. Continue lamictal, keppra (will change from IV to PO) b. TOC team on board   2. Acute encephalopathy/agitation, likely multifactorial: medication induced, seizures, possible alcohol withdrawal, underlying psychiatric issues, improving but not resolved a. More alert and conversant today b. Ammonia level c. CT brain and MRI brain unremarkable d. Off precedex e. Continue thiamine f. Appreciate psychiatry managing antipsychotic meds g. Plan for inpatient psych once medically clear and cleared by PT/OT?  3. Alcohol withdrawal a. Doing well off precedex and on current therapies  4. Depression a. Continue current care  5. Hypertension, stable  6. Hypokalemia, resolved  7. SIRS, resolved  a. Probably from poor PO intake and medication  induced b. Blood cultures no growth to date   DVT prophylaxis: enoxaparin (LOVENOX) injection 40 mg Start: 03/24/20 2315   Family Communication: no family at bedside  Disposition Plan: DC pending improved mentation and pending PT/OT and Psych Status is: Inpatient  Remains inpatient appropriate because:IV treatments appropriate due to intensity of illness or inability to take PO and Inpatient level of care appropriate due to severity of illness   Dispo: The patient is from: Home              Anticipated d/c is to: TBD              Anticipated d/c date is: 3 days              Patient currently is not medically stable to d/c.          Pressure injury documentation    None  Consultants  Psych neuro  Procedures  None  Antibiotics   Anti-infectives (From admission, onward)   None        Subjective   Sleeping upon arrival but easily wakes up.  States that he feels lethargic but denies other complaints. No overnight events. Sitter at bedside  Objective   Vitals:   04/03/20 0032 04/03/20 0548 04/03/20 0742 04/03/20 1151  BP: 135/88 128/89 (!) 128/94 121/87  Pulse: 72 70 68 82  Resp: 16 16 14 16   Temp: 98.3 F (36.8 C) (!) 97.4 F (36.3 C) (!) 97.4 F (36.3 C) 97.8 F (36.6 C)  TempSrc: Oral Oral Oral Oral  SpO2: 100% 97% 97% 98%  Weight:      Height:        Intake/Output Summary (Last 24 hours) at 04/03/2020 1254 Last data filed at 04/03/2020 1058 Gross per 24 hour  Intake 302.31 ml  Output 1200 ml  Net -897.69 ml   Filed Weights   03/24/20 2220  Weight: 81.6 kg    Examination:  Physical Exam Vitals and nursing note reviewed.  Constitutional:      General: He is not in acute distress.    Appearance: Normal appearance. He is not toxic-appearing.     Comments: Conversant and responds appropriately  HENT:     Head: Normocephalic and atraumatic.  Eyes:     Conjunctiva/sclera: Conjunctivae normal.  Cardiovascular:     Rate and Rhythm:  Normal rate and regular rhythm.  Pulmonary:     Effort: Pulmonary effort is normal.     Breath sounds: Normal breath sounds.  Abdominal:     General: Abdomen is flat.     Palpations: Abdomen is soft.  Musculoskeletal:        General: No swelling or tenderness.  Skin:    Coloration: Skin is not jaundiced or pale.  Neurological:     Mental Status: He is alert and oriented to person, place, and time. Mental status is at baseline.     Comments: Garbled speech  Psychiatric:        Mood and Affect: Mood normal.        Behavior: Behavior normal.     Data Reviewed: I have personally reviewed following labs and imaging studies  CBC: Recent Labs  Lab 03/28/20 0605 03/31/20 0917 04/01/20 0429 04/02/20 0426  WBC 4.2 4.8 6.2 5.8  NEUTROABS  --  3.1  --   --   HGB 11.1* 11.6* 12.8* 13.1  HCT 32.6* 33.9* 38.5* 39.1  MCV 87.6 88.1 87.9 87.9  PLT 213 248 324 352   Basic Metabolic Panel: Recent Labs  Lab 03/28/20 0605 03/29/20 0719 03/31/20 0917 04/01/20 0429 04/02/20 0426  NA 143 141 142 140 140  K 3.4* 4.2 3.3* 4.0 3.6  CL 111 107 109 107 104  CO2 28 26 22  20* 25  GLUCOSE 92 81 91 78 74  BUN 6 8 18 13 13   CREATININE 0.75 1.05 0.96 0.98 0.97  CALCIUM 8.1* 8.8* 7.9* 8.7* 9.0  MG  --   --   --  2.0  --    GFR: Estimated Creatinine Clearance: 92.1 mL/min (by C-G formula based on SCr of 0.97 mg/dL). Liver Function Tests: Recent Labs  Lab 03/31/20 0917  AST 56*  ALT 16  ALKPHOS 69  BILITOT 1.0  PROT 5.9*  ALBUMIN 3.5   No results for input(s): LIPASE, AMYLASE in the last 168 hours. No results for input(s): AMMONIA in the last 168 hours. Coagulation Profile: No results for input(s): INR, PROTIME in the last 168 hours. Cardiac Enzymes: No results for input(s): CKTOTAL, CKMB, CKMBINDEX, TROPONINI in the last 168 hours. BNP (last 3 results) No results for input(s): PROBNP  in the last 8760 hours. HbA1C: No results for input(s): HGBA1C in the last 72  hours. CBG: Recent Labs  Lab 03/31/20 0909  GLUCAP 93   Lipid Profile: No results for input(s): CHOL, HDL, LDLCALC, TRIG, CHOLHDL, LDLDIRECT in the last 72 hours. Thyroid Function Tests: No results for input(s): TSH, T4TOTAL, FREET4, T3FREE, THYROIDAB in the last 72 hours. Anemia Panel: No results for input(s): VITAMINB12, FOLATE, FERRITIN, TIBC, IRON, RETICCTPCT in the last 72 hours. Sepsis Labs: Recent Labs  Lab 03/31/20 0917 03/31/20 1222  LATICACIDVEN 0.6 0.7    Recent Results (from the past 240 hour(s))  SARS Coronavirus 2 by RT PCR (hospital order, performed in [facility] hospital lab) Nasopharyngeal Nasopharyngeal Swab     Status: None   Collection Time: 03/25/20  4:13 PM   Specimen: Nasopharyngeal Swab  Result Value Ref Range Status   SARS Coronavirus 2 NEGATIVE NEGATIVE Final    Comment: (NOTE) SARS-CoV-2 target nucleic acids are NOT DETECTED.  The SARS-CoV-2 RNA is generally detectable in upper and lower respiratory specimens during the acute phase of infection. The lowest concentration of SARS-CoV-2 viral copies this assay can detect is 250 copies / mL. A negative result does not preclude SARS-CoV-2 infection and should not be used as the sole basis for treatment or other patient management decisions.  A negative result may occur with improper specimen collection / handling, submission of specimen other than nasopharyngeal swab, presence of viral mutation(s) within the areas targeted by this assay, and inadequate number of viral copies (<250 copies / mL). A negative result must be combined with clinical observations, patient history, and epidemiological information.  Fact Sheet for Patients:   BoilerBrush.com.cy  Fact Sheet for Healthcare Providers: https://pope.com/  This test is not yet approved or  cleared by the [place] FDA and has been authorized for detection and/or diagnosis of SARS-CoV-2 by FDA  under an Emergency Use Authorization (EUA).  This EUA will remain in effect (meaning this test can be used) for the duration of the COVID-19 declaration under Section 564(b)(1) of the Act, 21 U.S.C. section 360bbb-3(b)(1), unless the authorization is terminated or revoked sooner.  Performed at [facility], [address] [place], [place] [address]   Culture, blood (Routine X 2) w Reflex to ID Panel     Status: None (Preliminary result)   Collection Time: 03/31/20 10:34 AM   Specimen: BLOOD  Result Value Ref Range Status   Specimen Description BLOOD BLOOD RIGHT ARM  Final   Special Requests   Final    BOTTLES DRAWN AEROBIC ONLY Blood Culture results may not be optimal due to an inadequate volume of blood received in culture bottles   Culture   Final    NO GROWTH 3 DAYS Performed at [facility], [address] [place], [place] [address]    Report Status PENDING  Incomplete  Culture, blood (Routine X 2) w Reflex to ID Panel     Status: None (Preliminary result)   Collection Time: 03/31/20 10:41 AM   Specimen: BLOOD  Result Value Ref Range Status   Specimen Description BLOOD LEFT ANTECUBITAL  Final   Special Requests   Final    BOTTLES DRAWN AEROBIC AND ANAEROBIC Blood Culture adequate volume   Culture   Final    NO GROWTH 3 DAYS Performed at [facility], [address], [place], [place] [address]    Report Status PENDING  Incomplete         Radiology Studies: No  results found.      Scheduled Meds: . benztropine  1 mg Oral BID  . clonazePAM  0.25 mg Oral BID  . dexmedetomidine  1 mcg/kg Intravenous Once  . DULoxetine  20 mg Oral Daily  . enoxaparin (LOVENOX) injection  40 mg Subcutaneous Q24H  . haloperidol  0.25 mg Oral BID  . lamoTRIgine  150 mg Oral BID  . levETIRAcetam  1,000 mg Oral BID  . OLANZapine zydis  7.5 mg Oral QHS  . thiamine injection  100 mg Intravenous Daily   Continuous Infusions: . sodium chloride Stopped  (04/03/20 0940)     Time spent: 30 minutes with over 50% of the time coordinating the patient's care    Jae Dire, DO [facility] Pager [phone]  Call night coverage person covering after 7pm

## 2020-04-04 LAB — COMPREHENSIVE METABOLIC PANEL
ALT: 13 U/L (ref 0–44)
AST: 17 U/L (ref 15–41)
Albumin: 4.3 g/dL (ref 3.5–5.0)
Alkaline Phosphatase: 78 U/L (ref 38–126)
Anion gap: 11 (ref 5–15)
BUN: 15 mg/dL (ref 6–20)
CO2: 26 mmol/L (ref 22–32)
Calcium: 8.8 mg/dL — ABNORMAL LOW (ref 8.9–10.3)
Chloride: 104 mmol/L (ref 98–111)
Creatinine, Ser: 0.94 mg/dL (ref 0.61–1.24)
GFR calc Af Amer: >60 mL/min (ref >60)
GFR calc non Af Amer: >60 mL/min (ref >60)
Glucose, Bld: 92 mg/dL (ref 70–99)
Potassium: 3.2 mmol/L — ABNORMAL LOW (ref 3.5–5.1)
Sodium: 141 mmol/L (ref 135–145)
Total Bilirubin: 0.7 mg/dL (ref 0.3–1.2)
Total Protein: 6.8 g/dL (ref 6.5–8.1)

## 2020-04-04 MED ORDER — POTASSIUM CHLORIDE CRYS ER 20 MEQ PO TBCR
40.0000 meq | EXTENDED_RELEASE_TABLET | Freq: Once | ORAL | Status: AC
  Administered 2020-04-04: 40 meq via ORAL
  Filled 2020-04-04: qty 2

## 2020-04-04 NOTE — Progress Notes (Signed)
PROGRESS NOTE    Lance Ray    Code Status: Full Code  XIH:[id number] DOB: 1971-06-05 DOA: 03/24/2020 LOS: 10 days  PCP: Patient, No Pcp Per CC:  Chief Complaint  Patient presents with  . Seizures       Hospital Summary   [name], who has not been on his Depakote for months and presented to the emergency room with acute onset of witnessedgeneralized tonic-clonicseizure at [employer]. He was seen earlier in the ER, observed in the ER for 5 hours without recurrent seizure activityand given a prescription for Depakote ERand dischargedin a good Rx coupon. He had a recurrent tonic-clonicseizure in the parking lot that brought him back to the ER.He denied any tongue bites. He was fairly somnolent during my interview after being given IV Ativan and Keppra but was arousable. He denied any headache or dizziness or blurred vision. No paresthesias or focal muscle weakness. He stated that he has not taken his Depakote for 6 months and Lamictal for 2 weeks. No chest pain or dyspnea or cough or wheezing. No nausea or vomiting or abdominal pain. No fever or chills.  Upon presentation to the emergency room, blood pressure was 141/88 with otherwise normal vital signs. Earlier it was 128/89. Labs reviewed potassium of 3.6 with otherwise normal BMP and CBC was within normal. Noncontrasted head CT scan revealed no acute intracranial abnormalities.EKG showed sinus tachycardia with a rate of 115 with probable left atrial enlargement.  The patient was given 1 mg of IV lorazepam and was loaded with 1 g of IV Keppra. He was admitted to an observation PCU bed for further evaluation and management.  [facility] were consulted to admit the patient for further evaluation and treatment. The patient had been taking depakote for many years according to his sister. She stated that she did not think that it  worked well. She understood that when the patient presented to [facility] with a seizure a while ago that was why they had loaded the patient with keppra and started him on that. According to the sister the pattern is that the patient will be seen at a hospital ED after a seizure. He will be set up with one month of meds, and then he will stop taking his meds when he runs out. He will not act in his own self interest to maintain his health. They have looked into getting medicaid for him, but the patient is not cooperative with the process.  Psychiatry has been consulted to evaluate the patient's depression. Transitions of Care has been consulted to help set the patient up with outpatient mental health clinic and to help the patient to set up an ongoing source of antiepileptics.  On the morning of  the patient is complaining of dysarthria and difficulty walking. Per PT who is in the room, the patient is ataxic. I observe him trying to get back in bed. He is very unsteady on his feet. CT and MRI performed and were negative for acute pathology.  Later on 03/27/2020, after he had been seen and cleared by psychiatry, he required a sitter, because he was found by nursing standing in the middle of his bed "trying to fly like superman". Psychiatry asked to return to re-evaluate the patient. [name] came to re-evaluate the patient and put him on low dose antipsychotics and anxiolytics.   On 03/29/2020 hospitalist awas called to the patient's room regarding a security alert. The patient  had tried to leave and had become combative. He was treated with 1 mg ativan and 5 mg haldol IM. The patient was requiring physical restraint by nine healthcare workers and Catering manager. He claimed that he was being taken away for a "treatment" which was not the case. His sister arrived and attempted to talk to the patient and get him to calm down, but she was unsuccessful. The patient was very combative and violent with personnel  swinging and kicking at them. In my opinion, if this patient is allowed to leave the premises in his current state he is very likely to come to harm due to his psychotic and delusional state. He is also likely to cause harm to others. CM [name] said that I could not IVC the patient unless he said that he was going to kill himself, although he is clearly psychotic. She stated that only [name] could IVC the patient. [name] was called to the floor. I discussed the patient with him. He did IVC the patient pending stabilization.  The patient was transferred to the ICU last night after another outburst requiring a security alert. He is now on a precedex drip. The patient's sisters are working to obtain guardianship of the patient. Although it took precedex ultimately to calm the patient down and address his tachycardia and hypertension, I had not considered ETOH withdrawal due to the patient given history that he drank 2 beers and to shots of hard liquor a week. This may not have been accurate.  7/9: transfer out of SDU  A & P   Active Problems:   Recurrent seizures (HCC)   Seizure (HCC)   1. Seizures likely secondary to medication noncompliance, stable a. Continue lamictal, keppra PO b. TOC team on board   2. Acute encephalopathy/agitation, likely multifactorial: medication induced, seizures, possible alcohol withdrawal, underlying psychiatric issues, improving but not resolved a. Continues to improve daily b. Ammonia level unremarkable c. CT brain and MRI brain unremarkable d. Off precedex e. Continue thiamine f. Appreciate psychiatry managing antipsychotic meds g. Plan for inpatient psych once medically clear and cleared by PT/OT?  3. Alcohol withdrawal a. Doing well off precedex and on current therapies  4. Depression a. Continue current care  5. Hypertension, stable  6. Hypokalemia, resolved  7. SIRS secondary to above, resolved    DVT prophylaxis: enoxaparin (LOVENOX)  injection 40 mg Start: 03/24/20 2315   Family Communication: no family at bedside  Disposition Plan: DC pending improved mentation and pending PT/OT and Psych Status is: Inpatient  Remains inpatient appropriate because:Unsafe d/c plan   Dispo: The patient is from: Home              Anticipated d/c is to: TBD              Anticipated d/c date is: 2 days              Patient currently is not medically stable to d/c.          Pressure injury documentation    None  Consultants  Psych neuro  Procedures  None  Antibiotics   Anti-infectives (From admission, onward)   None        Subjective   Watching tv with sitter at bedside. Has multiple appropriate questions and is more conversant today. No complaints or overnight events   Objective   Vitals:   04/03/20 2020 04/04/20 0024 04/04/20 0441 04/04/20 0727  BP: (!) 116/91 (!) 130/98 137/90 120/75  Pulse: (!) 109 93 87 74  Resp: 18 17 18 16   Temp: 99.5 F (37.5 C) 98.1 F (36.7 C) 98.9 F (37.2 C) 97.9 F (36.6 C)  TempSrc: Oral Oral Oral Oral  SpO2: 97% 98% 97% 95%  Weight:      Height:        Intake/Output Summary (Last 24 hours) at 04/04/2020 1147 Last data filed at 04/04/2020 0600 Gross per 24 hour  Intake 680 ml  Output 950 ml  Net -270 ml   Filed Weights   03/24/20 2220  Weight: 81.6 kg    Examination:  Physical Exam Vitals and nursing note reviewed. Exam conducted with a chaperone present.  Constitutional:      Appearance: Normal appearance.  HENT:     Head: Normocephalic and atraumatic.  Cardiovascular:     Rate and Rhythm: Regular rhythm. Tachycardia present.  Pulmonary:     Effort: Pulmonary effort is normal.     Breath sounds: Normal breath sounds.  Abdominal:     General: Abdomen is flat.     Palpations: Abdomen is soft.  Musculoskeletal:        General: No swelling or tenderness.  Skin:    Coloration: Skin is not jaundiced or pale.  Neurological:     Mental Status: He  is alert.  Psychiatric:        Mood and Affect: Mood normal.        Behavior: Behavior normal.        Thought Content: Thought content normal.     Data Reviewed: I have personally reviewed following labs and imaging studies  CBC: Recent Labs  Lab 03/31/20 0917 04/01/20 0429 04/02/20 0426  WBC 4.8 6.2 5.8  NEUTROABS 3.1  --   --   HGB 11.6* 12.8* 13.1  HCT 33.9* 38.5* 39.1  MCV 88.1 87.9 87.9  PLT 248 324 352   Basic Metabolic Panel: Recent Labs  Lab 03/29/20 0719 03/31/20 0917 04/01/20 0429 04/02/20 0426 04/04/20 0808  NA 141 142 140 140 141  K 4.2 3.3* 4.0 3.6 3.2*  CL 107 109 107 104 104  CO2 26 22 20* 25 26  GLUCOSE 81 91 78 74 92  BUN 8 18 13 13 15   CREATININE 1.05 0.96 0.98 0.97 0.94  CALCIUM 8.8* 7.9* 8.7* 9.0 8.8*  MG  --   --  2.0  --   --    GFR: Estimated Creatinine Clearance: 95.1 mL/min (by C-G formula based on SCr of 0.94 mg/dL). Liver Function Tests: Recent Labs  Lab 03/31/20 0917 04/04/20 0808  AST 56* 17  ALT 16 13  ALKPHOS 69 78  BILITOT 1.0 0.7  PROT 5.9* 6.8  ALBUMIN 3.5 4.3   No results for input(s): LIPASE, AMYLASE in the last 168 hours. Recent Labs  Lab 04/03/20 1314  AMMONIA 28   Coagulation Profile: No results for input(s): INR, PROTIME in the last 168 hours. Cardiac Enzymes: No results for input(s): CKTOTAL, CKMB, CKMBINDEX, TROPONINI in the last 168 hours. BNP (last 3 results) No results for input(s): PROBNP in the last 8760 hours. HbA1C: No results for input(s): HGBA1C in the last 72 hours. CBG: Recent Labs  Lab 03/31/20 0909  GLUCAP 93   Lipid Profile: No results for input(s): CHOL, HDL, LDLCALC, TRIG, CHOLHDL, LDLDIRECT in the last 72 hours. Thyroid Function Tests: No results for input(s): TSH, T4TOTAL, FREET4, T3FREE, THYROIDAB in the last 72 hours. Anemia Panel: No results for input(s): VITAMINB12, FOLATE, FERRITIN, TIBC, IRON, RETICCTPCT  in the last 72 hours. Sepsis Labs: Recent Labs  Lab 03/31/20 0917  03/31/20 1222  LATICACIDVEN 0.6 0.7    Recent Results (from the past 240 hour(s))  SARS Coronavirus 2 by RT PCR (hospital order, performed in [facility] hospital lab) Nasopharyngeal Nasopharyngeal Swab     Status: None   Collection Time: 03/25/20  4:13 PM   Specimen: Nasopharyngeal Swab  Result Value Ref Range Status   SARS Coronavirus 2 NEGATIVE NEGATIVE Final    Comment: (NOTE) SARS-CoV-2 target nucleic acids are NOT DETECTED.  The SARS-CoV-2 RNA is generally detectable in upper and lower respiratory specimens during the acute phase of infection. The lowest concentration of SARS-CoV-2 viral copies this assay can detect is 250 copies / mL. A negative result does not preclude SARS-CoV-2 infection and should not be used as the sole basis for treatment or other patient management decisions.  A negative result may occur with improper specimen collection / handling, submission of specimen other than nasopharyngeal swab, presence of viral mutation(s) within the areas targeted by this assay, and inadequate number of viral copies (<250 copies / mL). A negative result must be combined with clinical observations, patient history, and epidemiological information.  Fact Sheet for Patients:   BoilerBrush.com.cy  Fact Sheet for Healthcare Providers: https://pope.com/  This test is not yet approved or  cleared by the [place] FDA and has been authorized for detection and/or diagnosis of SARS-CoV-2 by FDA under an Emergency Use Authorization (EUA).  This EUA will remain in effect (meaning this test can be used) for the duration of the COVID-19 declaration under Section 564(b)(1) of the Act, 21 U.S.C. section 360bbb-3(b)(1), unless the authorization is terminated or revoked sooner.  Performed at [facility] [address] [place], [place] [address]   Culture, blood (Routine X 2) w Reflex to ID Panel     Status: None  (Preliminary result)   Collection Time: 03/31/20 10:34 AM   Specimen: BLOOD  Result Value Ref Range Status   Specimen Description BLOOD BLOOD RIGHT ARM  Final   Special Requests   Final    BOTTLES DRAWN AEROBIC ONLY Blood Culture results may not be optimal due to an inadequate volume of blood received in culture bottles   Culture   Final    NO GROWTH 4 DAYS Performed at [facility], [address], [place], [place] [address]    Report Status PENDING  Incomplete  Culture, blood (Routine X 2) w Reflex to ID Panel     Status: None (Preliminary result)   Collection Time: 03/31/20 10:41 AM   Specimen: BLOOD  Result Value Ref Range Status   Specimen Description BLOOD LEFT ANTECUBITAL  Final   Special Requests   Final    BOTTLES DRAWN AEROBIC AND ANAEROBIC Blood Culture adequate volume   Culture   Final    NO GROWTH 4 DAYS Performed at [facility], [address], [place], [place] [address]    Report Status PENDING  Incomplete         Radiology Studies: No results found.      Scheduled Meds: . benztropine  1 mg Oral BID  . clonazePAM  0.25 mg Oral BID  . dexmedetomidine  1 mcg/kg Intravenous Once  . DULoxetine  20 mg Oral Daily  . enoxaparin (LOVENOX) injection  40 mg Subcutaneous Q24H  . haloperidol  0.25 mg Oral BID  . lamoTRIgine  150 mg Oral BID  . levETIRAcetam  1,000 mg Oral BID  .  OLANZapine zydis  7.5 mg Oral QHS  . thiamine injection  100 mg Intravenous Daily   Continuous Infusions: . sodium chloride Stopped (04/03/20 0940)     Time spent: 25 minutes with over 50% of the time coordinating the patient's care    Jae DireJared E Lydiann Bonifas, DO [facility] Pager [phone]  Call night coverage person covering after 7pm

## 2020-04-05 LAB — CULTURE, BLOOD (ROUTINE X 2)
Culture: NO GROWTH
Culture: NO GROWTH
Special Requests: ADEQUATE

## 2020-04-05 LAB — LEVETIRACETAM LEVEL: Levetiracetam Lvl: 19.8 ug/mL (ref 10.0–40.0)

## 2020-04-05 MED ORDER — THIAMINE HCL 100 MG PO TABS
100.0000 mg | ORAL_TABLET | Freq: Every day | ORAL | Status: DC
  Administered 2020-04-05 – 2020-04-09 (×5): 100 mg via ORAL
  Filled 2020-04-05 (×5): qty 1

## 2020-04-05 NOTE — Progress Notes (Signed)
Occupational Therapy Treatment Patient Details Name: Lance Ray MRN: [id number] DOB: Feb 28, 1971 Today's Date: 04/05/2020    History of present illness 49 y.o. male with history of seizure disorder, who has not been on his Depakote for months and presented to the emergency room with acute onset of witnessed generalized tonic-clonic seizure, apparently he was discharged and had another seizure in the parking lot and returned, OT re-eval completed in CCU (7/9)   OT comments  Pt seen for OT treatment this date to f/u re: safety with ADLs and ADL transfers/mobility. Initial plan was to assist pt with fxl mobility with RW from EOB to sink ~5-30ft away to then complete 1-2 g/h tasks to increase fxl activity tolerance.  However, pt demos POOR static standing balance with weight in heels and tipping backward requiring MOD A to sustain safe static balance. OT provides setup on BST for pt to complete oral hygiene in EOB sitting, but pt demos significant amount of spillage requiring MIN A. Pt is returned to bed in long-sitting supported position to participate in UB bathing with MIN A, use of mirror and MIN verbal/tactile cues to wash, dry and comb his hair. Overall, pt continues to require significant amount of assist with ADLs. Anticipate he will need continued OT follow up.    Follow Up Recommendations  Other (comment) (pending progress likely w/ transition to behavioral unit pre-D/C)    Equipment Recommendations  Other (comment) (TBD pending progress)    Recommendations for Other Services Rehab consult    Precautions / Restrictions Precautions Precautions: Fall Restrictions Weight Bearing Restrictions: No       Mobility Bed Mobility Overal bed mobility: Needs Assistance Bed Mobility: Supine to Sit;Sit to Supine     Supine to sit: Min assist Sit to supine: Min assist   General bed mobility comments: Improved ability to get to EOB, but very unccordinated in his efforts with  extremities flailing to accomplish task.  Transfers Overall transfer level: Needs assistance Equipment used: Rolling walker (2 wheeled) Transfers: Sit to/from Stand Sit to Stand: Min assist;Mod assist         General transfer comment: Pt with improved capability to come to standing versus evaluation date, but with weight in heels and significant posterior lean each time upon reachnig extended stand and despite assist, only able to sustain for ~15 secs at time before plopping back to bed demo'ing very poor eccentric control.    Balance Overall balance assessment: Needs assistance Sitting-balance support: Bilateral upper extremity supported;Feet supported Sitting balance-Leahy Scale: Fair   Postural control: Posterior lean Standing balance support: Bilateral upper extremity supported Standing balance-Leahy Scale: Poor Standing balance comment: weight in heels                           ADL either performed or assessed with clinical judgement   ADL Overall ADL's : Needs assistance/impaired Eating/Feeding: Set up;Minimal assistance;Bed level;Cueing for sequencing Eating/Feeding Details (indicate cue type and reason): HOB elevated Grooming: Wash/dry face;Oral care;Minimal assistance;Sitting;Cueing for sequencing Grooming Details (indicate cue type and reason): Started activity in EOB sitting, but pt with Poor sitting balance w/o UE support (using UEs to complete task). Transitioned to long sitting in bed with HOB elevated for support and pt still requiring MIN A to complete task d/t spilling/lack of coordination. Upper Body Bathing: Minimal assistance;Bed level;Cueing for sequencing Upper Body Bathing Details (indicate cue type and reason): HOB elevated-to wash and dry hair  Vision Patient Visual Report: No change from baseline Additional Comments: difficult to formally assess   Perception     Praxis      Cognition  Arousal/Alertness: Awake/alert Behavior During Therapy: Restless;Anxious Overall Cognitive Status: Difficult to assess                                 General Comments: Pt with garbled speech. Somewhat more oriented this date. Able to state that the month is July, but still pretty unsure of situation. Pt requires MOD cues to sequence any given task. Pt with flught of ideas, speaking quickly, somewhat tremulous with some periods in which this resolves with breathing/relaxation cues.        Exercises     Shoulder Instructions       General Comments      Pertinent Vitals/ Pain       Pain Assessment: No/denies pain  Home Living                                          Prior Functioning/Environment              Frequency  Min 2X/week        Progress Toward Goals  OT Goals(current goals can now be found in the care plan section)  Progress towards OT goals: Progressing toward goals  Acute Rehab OT Goals Patient Stated Goal: to go home to his cat OT Goal Formulation: With patient Time For Goal Achievement: 04/16/20 Potential to Achieve Goals: Fair  Plan      Co-evaluation                 AM-PAC OT "6 Clicks" Daily Activity     Outcome Measure   Help from another person eating meals?: A Little Help from another person taking care of personal grooming?: A Little Help from another person toileting, which includes using toliet, bedpan, or urinal?: A Lot Help from another person bathing (including washing, rinsing, drying)?: A Lot Help from another person to put on and taking off regular upper body clothing?: A Little Help from another person to put on and taking off regular lower body clothing?: A Lot 6 Click Score: 15    End of Session Equipment Utilized During Treatment: Gait belt;Rolling walker  OT Visit Diagnosis: Unsteadiness on feet (R26.81);Ataxia, unspecified (R27.0)   Activity Tolerance Patient tolerated treatment  well   Patient Left in bed;with call bell/phone within reach;with bed alarm set;with nursing/sitter in room   Nurse Communication Mobility status        Time: 5170-0174 OT Time Calculation (min): 45 min  Charges: OT General Charges $OT Visit: 1 Visit OT Treatments $Self Care/Home Management : 23-37 mins $Therapeutic Activity: 8-22 mins  Rejeana Brock, MS, OTR/L ascom [phone] 04/06/20, 3:28 PM

## 2020-04-05 NOTE — Progress Notes (Signed)
PROGRESS NOTE    Lance Ray    Code Status: Full Code  JOA:[id number] DOB: 03/01/1971 DOA: 03/24/2020 LOS: 11 days  PCP: Patient, No Pcp Per CC:  Chief Complaint  Patient presents with  . Seizures       Hospital Summary   [name], who has not been on his Depakote for months and presented to the emergency room with acute onset of witnessedgeneralized tonic-clonicseizure at [facility]. He was seen earlier in the ER, observed in the ER for 5 hours without recurrent seizure activityand given a prescription for Depakote ERand dischargedin a good Rx coupon. He had a recurrent tonic-clonicseizure in the parking lot that brought him back to the ER.He denied any tongue bites. He was fairly somnolent during my interview after being given IV Ativan and Keppra but was arousable. He denied any headache or dizziness or blurred vision. No paresthesias or focal muscle weakness. He stated that he has not taken his Depakote for 6 months and Lamictal for 2 weeks. No chest pain or dyspnea or cough or wheezing. No nausea or vomiting or abdominal pain. No fever or chills.  Upon presentation to the emergency room, blood pressure was 141/88 with otherwise normal vital signs. Earlier it was 128/89. Labs reviewed potassium of 3.6 with otherwise normal BMP and CBC was within normal. Noncontrasted head CT scan revealed no acute intracranial abnormalities.EKG showed sinus tachycardia with a rate of 115 with probable left atrial enlargement.  The patient was given 1 mg of IV lorazepam and was loaded with 1 g of IV Keppra. He was admitted to an observation PCU bed for further evaluation and management.  [facility] were consulted to admit the patient for further evaluation and treatment. The patient had been taking depakote for many years according to his sister. She stated that she did not think that it  worked well. She understood that when the patient presented to [facility] with a seizure a while ago that was why they had loaded the patient with keppra and started him on that. According to the sister the pattern is that the patient will be seen at a hospital ED after a seizure. He will be set up with one month of meds, and then he will stop taking his meds when he runs out. He will not act in his own self interest to maintain his health. They have looked into getting medicaid for him, but the patient is not cooperative with the process.  Psychiatry has been consulted to evaluate the patient's depression. Transitions of Care has been consulted to help set the patient up with outpatient mental health clinic and to help the patient to set up an ongoing source of antiepileptics.  On the morning of  the patient is complaining of dysarthria and difficulty walking. Per PT who is in the room, the patient is ataxic. I observe him trying to get back in bed. He is very unsteady on his feet. CT and MRI performed and were negative for acute pathology.  Later on 03/27/2020, after he had been seen and cleared by psychiatry, he required a sitter, because he was found by nursing standing in the middle of his bed "trying to fly like superman". Psychiatry asked to return to re-evaluate the patient. [name] came to re-evaluate the patient and put him on low dose antipsychotics and anxiolytics.   On 03/29/2020 hospitalist awas called to the patient's room regarding a security alert. The patient  had tried to leave and had become combative. He was treated with 1 mg ativan and 5 mg haldol IM. The patient was requiring physical restraint by nine healthcare workers and Catering manager. He claimed that he was being taken away for a "treatment" which was not the case. His sister arrived and attempted to talk to the patient and get him to calm down, but she was unsuccessful. The patient was very combative and violent with personnel  swinging and kicking at them. In my opinion, if this patient is allowed to leave the premises in his current state he is very likely to come to harm due to his psychotic and delusional state. He is also likely to cause harm to others. CM [name] said that I could not IVC the patient unless he said that he was going to kill himself, although he is clearly psychotic. She stated that only [name] could IVC the patient. [name] was called to the floor. I discussed the patient with him. He did IVC the patient pending stabilization.  The patient was transferred to the ICU last night after another outburst requiring a security alert. He is now on a precedex drip. The patient's sisters are working to obtain guardianship of the patient. Although it took precedex ultimately to calm the patient down and address his tachycardia and hypertension, I had not considered ETOH withdrawal due to the patient given history that he drank 2 beers and to shots of hard liquor a week. This may not have been accurate.  7/9: transfer out of SDU  A & P   Active Problems:   Recurrent seizures (HCC)   Seizure (HCC)   1. Seizures likely secondary to medication noncompliance, stable a. Continue lamictal, keppra PO b. TOC team on board   2. Acute encephalopathy/agitation, likely multifactorial: medication induced, seizures, possible alcohol withdrawal, underlying psychiatric issues, improving but not resolved a. Continues to improve daily b. CT brain and MRI brain unremarkable c. Off precedex d. Continue thiamine e. Appreciate psychiatry recommendations  f. Plan for inpatient psych once medically clear and cleared by PT/OT?  3. Alcohol withdrawal, stable a. Doing well off precedex and on current therapies  4. Depression a. Continue current care  5. Hypertension, stable  6. Hypokalemia, treated yesterday  7. SIRS secondary to above, resolved    DVT prophylaxis: enoxaparin (LOVENOX) injection 40 mg Start:  03/24/20 2315   Family Communication: no family at bedside  Disposition Plan: DC pending improved mentation and pending PT/OT and Psych Status is: Inpatient  Remains inpatient appropriate because:Unsafe d/c plan   Dispo: The patient is from: Home              Anticipated d/c is to: TBD              Anticipated d/c date is: 2 days              Patient currently is not medically stable to d/c.          Pressure injury documentation    None  Consultants  Psych neuro  Procedures  None  Antibiotics   Anti-infectives (From admission, onward)   None        Subjective   Sitter at bedside. Patient states he feels he is at/near his baseline today and feels like his speech is much better. Asking about discharge plans. Then began to ask if a helicopter would pick him up or a plane. Unable to follow his thought process. No events  Objective   Vitals:   04/05/20 1119 04/05/20 1246 04/05/20 1351 04/05/20 1540  BP: (!) 124/94  126/89 94/70  Pulse: (!) 122 (!) 111 (!) 110 100  Resp: 20  20 20   Temp: 97.7 F (36.5 C)  98.1 F (36.7 C) 98.7 F (37.1 C)  TempSrc: Oral  Oral Oral  SpO2: 99%  98% 98%  Weight:      Height:        Intake/Output Summary (Last 24 hours) at 04/05/2020 1604 Last data filed at 04/05/2020 1300 Gross per 24 hour  Intake 960 ml  Output 1150 ml  Net -190 ml   Filed Weights   03/24/20 2220  Weight: 81.6 kg    Examination:  Physical Exam Vitals and nursing note reviewed. Exam conducted with a chaperone present.  HENT:     Head: Normocephalic and atraumatic.  Eyes:     Conjunctiva/sclera: Conjunctivae normal.  Cardiovascular:     Rate and Rhythm: Normal rate and regular rhythm.  Pulmonary:     Effort: Pulmonary effort is normal.     Breath sounds: Normal breath sounds.  Abdominal:     General: Abdomen is flat.     Palpations: Abdomen is soft.  Musculoskeletal:        General: No swelling or tenderness.  Neurological:      General: No focal deficit present.     Mental Status: He is alert.  Psychiatric:     Comments: Abnormal thought content, about a helicopter picking him up?     Data Reviewed: I have personally reviewed following labs and imaging studies  CBC: Recent Labs  Lab 03/31/20 0917 04/01/20 0429 04/02/20 0426  WBC 4.8 6.2 5.8  NEUTROABS 3.1  --   --   HGB 11.6* 12.8* 13.1  HCT 33.9* 38.5* 39.1  MCV 88.1 87.9 87.9  PLT 248 324 352   Basic Metabolic Panel: Recent Labs  Lab 03/31/20 0917 04/01/20 0429 04/02/20 0426 04/04/20 0808  NA 142 140 140 141  K 3.3* 4.0 3.6 3.2*  CL 109 107 104 104  CO2 22 20* 25 26  GLUCOSE 91 78 74 92  BUN 18 13 13 15   CREATININE 0.96 0.98 0.97 0.94  CALCIUM 7.9* 8.7* 9.0 8.8*  MG  --  2.0  --   --    GFR: Estimated Creatinine Clearance: 95.1 mL/min (by C-G formula based on SCr of 0.94 mg/dL). Liver Function Tests: Recent Labs  Lab 03/31/20 0917 04/04/20 0808  AST 56* 17  ALT 16 13  ALKPHOS 69 78  BILITOT 1.0 0.7  PROT 5.9* 6.8  ALBUMIN 3.5 4.3   No results for input(s): LIPASE, AMYLASE in the last 168 hours. Recent Labs  Lab 04/03/20 1314  AMMONIA 28   Coagulation Profile: No results for input(s): INR, PROTIME in the last 168 hours. Cardiac Enzymes: No results for input(s): CKTOTAL, CKMB, CKMBINDEX, TROPONINI in the last 168 hours. BNP (last 3 results) No results for input(s): PROBNP in the last 8760 hours. HbA1C: No results for input(s): HGBA1C in the last 72 hours. CBG: Recent Labs  Lab 03/31/20 0909  GLUCAP 93   Lipid Profile: No results for input(s): CHOL, HDL, LDLCALC, TRIG, CHOLHDL, LDLDIRECT in the last 72 hours. Thyroid Function Tests: No results for input(s): TSH, T4TOTAL, FREET4, T3FREE, THYROIDAB in the last 72 hours. Anemia Panel: No results for input(s): VITAMINB12, FOLATE, FERRITIN, TIBC, IRON, RETICCTPCT in the last 72 hours. Sepsis Labs: Recent Labs  Lab 03/31/20 [phone] 03/31/20 1222  LATICACIDVEN 0.6 0.7     Recent Results (from the past 240 hour(s))  Culture, blood (Routine X 2) w Reflex to ID Panel     Status: None   Collection Time: 03/31/20 10:34 AM   Specimen: BLOOD  Result Value Ref Range Status   Specimen Description BLOOD BLOOD RIGHT ARM  Final   Special Requests   Final    BOTTLES DRAWN AEROBIC ONLY Blood Culture results may not be optimal due to an inadequate volume of blood received in culture bottles   Culture   Final    NO GROWTH 5 DAYS Performed at [facility], [address] [place], [place] [address]    Report Status 04/05/2020 FINAL  Final  Culture, blood (Routine X 2) w Reflex to ID Panel     Status: None   Collection Time: 03/31/20 10:41 AM   Specimen: BLOOD  Result Value Ref Range Status   Specimen Description BLOOD LEFT ANTECUBITAL  Final   Special Requests   Final    BOTTLES DRAWN AEROBIC AND ANAEROBIC Blood Culture adequate volume   Culture   Final    NO GROWTH 5 DAYS Performed at [facility], [address], [place], [place] [address]    Report Status 04/05/2020 FINAL  Final         Radiology Studies: No results found.      Scheduled Meds: . benztropine  1 mg Oral BID  . clonazePAM  0.25 mg Oral BID  . dexmedetomidine  1 mcg/kg Intravenous Once  . DULoxetine  20 mg Oral Daily  . enoxaparin (LOVENOX) injection  40 mg Subcutaneous Q24H  . haloperidol  0.25 mg Oral BID  . lamoTRIgine  150 mg Oral BID  . levETIRAcetam  1,000 mg Oral BID  . OLANZapine zydis  7.5 mg Oral QHS  . thiamine  100 mg Oral Daily   Continuous Infusions: . sodium chloride Stopped (04/03/20 0940)     Time spent: 27 minutes with over 50% of the time coordinating the patient's care    Jae Dire, DO [facility] Pager [phone]  Call night coverage person covering after 7pm

## 2020-04-06 ENCOUNTER — Telehealth: Payer: Self-pay | Admitting: General Practice

## 2020-04-06 LAB — BASIC METABOLIC PANEL
Anion gap: 6 (ref 5–15)
BUN: 19 mg/dL (ref 6–20)
CO2: 27 mmol/L (ref 22–32)
Calcium: 9.2 mg/dL (ref 8.9–10.3)
Chloride: 105 mmol/L (ref 98–111)
Creatinine, Ser: 1 mg/dL (ref 0.61–1.24)
GFR calc Af Amer: >60 mL/min (ref >60)
GFR calc non Af Amer: >60 mL/min (ref >60)
Glucose, Bld: 91 mg/dL (ref 70–99)
Potassium: 4 mmol/L (ref 3.5–5.1)
Sodium: 138 mmol/L (ref 135–145)

## 2020-04-06 LAB — HIV ANTIBODY (ROUTINE TESTING W REFLEX): HIV Screen 4th Generation wRfx: NONREACTIVE

## 2020-04-06 MED ORDER — LORAZEPAM 2 MG/ML IJ SOLN
1.0000 mg | Freq: Once | INTRAMUSCULAR | Status: AC
  Administered 2020-04-06: 1 mg via INTRAVENOUS

## 2020-04-06 MED ORDER — OLANZAPINE 5 MG PO TBDP: 7.5000 mg | ORAL_TABLET | Freq: Every day | ORAL | 0 refills | Status: DC

## 2020-04-06 MED ORDER — DULOXETINE HCL 30 MG PO CPEP: 30.0000 mg | ORAL_CAPSULE | Freq: Every day | ORAL | 3 refills | Status: DC

## 2020-04-06 MED ORDER — LEVETIRACETAM 1000 MG PO TABS: 1000.0000 mg | ORAL_TABLET | Freq: Two times a day (BID) | ORAL | Status: DC

## 2020-04-06 MED ORDER — LAMOTRIGINE 150 MG PO TABS: 150.0000 mg | ORAL_TABLET | Freq: Two times a day (BID) | ORAL | Status: DC

## 2020-04-06 MED ORDER — THIAMINE HCL 100 MG PO TABS: 100.0000 mg | ORAL_TABLET | Freq: Every day | ORAL | Status: DC

## 2020-04-06 MED ORDER — CLONAZEPAM 0.25 MG PO TBDP: 0.2500 mg | ORAL_TABLET | Freq: Two times a day (BID) | ORAL | 0 refills | Status: DC

## 2020-04-06 MED ORDER — DULOXETINE HCL 30 MG PO CPEP
30.0000 mg | ORAL_CAPSULE | Freq: Every day | ORAL | Status: DC
  Administered 2020-04-07 – 2020-04-09 (×3): 30 mg via ORAL
  Filled 2020-04-06 (×3): qty 1

## 2020-04-06 MED ORDER — BENZTROPINE MESYLATE 1 MG PO TABS: 1.0000 mg | ORAL_TABLET | Freq: Two times a day (BID) | ORAL | 0 refills | Status: DC

## 2020-04-06 MED ORDER — HALOPERIDOL 0.5 MG PO TABS: 0.2500 mg | ORAL_TABLET | Freq: Two times a day (BID) | ORAL | Status: DC

## 2020-04-06 NOTE — Progress Notes (Addendum)
1120 Pt has increased restlessness and agitation. Notified MD see you new orders.

## 2020-04-06 NOTE — Progress Notes (Addendum)
Lance Candise Bowens MD Psych Brief Note Entry  04/06/20  Patient can go to our unit for continued Psych care --if we do not have a bed ---- TTS needs to transfer to SW for outlying referral.  We should know later this afternoon   Should be kept on same Psych meds at this time   Thanks [name]   Patient is on psych meds now more compliant Generally seizures under control   Brief  MS --was seen yesterday late  Note entry is late ---today    Alert generally almost at baseline] Mood and anxiety reducing  He is less psychotic and making more sense No movements or other issues for now  IVC was renewed in general for longer bed stay  He has two reliable sisters for more history have spoke to step sister at length in family meeting  He has ETOH dependence now in remission since the hospital   Intractable seizures remained mainly due to non compliance of depakote over time but this time with psych intervention and post ICU stay he is more stable   Post Psych discharge, family has plan for some form of power of attorney and guardianship

## 2020-04-06 NOTE — Telephone Encounter (Signed)
Individual has been contacted 3+ times regarding ED referral. No further attempts to contact individual will be made. 

## 2020-04-06 NOTE — Progress Notes (Signed)
PROGRESS NOTE    Lance Ray    Code Status: Full Code  EXN:[id number] DOB: [birth year] DOA: 03/24/2020 LOS: 12 days  PCP: Lance Ray, No Pcp Per CC:  Chief Complaint  Lance Ray presents with  . Seizures       Hospital Summary   [name], who has not been on his Depakote for months and presented to the emergency room with acute onset of witnessedgeneralized tonic-clonicseizure at [facility] [employer]. He was seen earlier in the ER, observed in the ER for 5 hours without recurrent seizure activityand given a prescription for Depakote ERand dischargedin a good Rx coupon. He had a recurrent tonic-clonicseizure in the parking lot that brought him back to the ER.He denied any tongue bites. He was fairly somnolent during my interview after being given IV Ativan and Keppra but was arousable. He denied any headache or dizziness or blurred vision. No paresthesias or focal muscle weakness. He stated that he has not taken his Depakote for 6 months and Lamictal for 2 weeks. No chest pain or dyspnea or cough or wheezing. No nausea or vomiting or abdominal pain. No fever or chills.  Upon presentation to the emergency room, blood pressure was 141/88 with otherwise normal vital signs. Earlier it was 128/89. Labs reviewed potassium of 3.6 with otherwise normal BMP and CBC was within normal. Noncontrasted head CT scan revealed no acute intracranial abnormalities.EKG showed sinus tachycardia with a rate of 115 with probable left atrial enlargement.  The Lance Ray was given 1 mg of IV lorazepam and was loaded with 1 g of IV Keppra. He was admitted to an observation PCU bed for further evaluation and management.  [facility] were consulted to admit the Lance Ray for further evaluation and treatment. The Lance Ray had been taking depakote for many years according to his sister. She stated that she did not think that it  worked well. She understood that when the Lance Ray presented to [facility] with a seizure a while ago that was why they had loaded the Lance Ray with keppra and started him on that. According to the sister the pattern is that the Lance Ray will be seen at a hospital ED after a seizure. He will be set up with one month of meds, and then he will stop taking his meds when he runs out. He will not act in his own self interest to maintain his health. They have looked into getting medicaid for him, but the Lance Ray is not cooperative with the process.  Psychiatry has been consulted to evaluate the Lance Ray's depression. Transitions of Care has been consulted to help set the Lance Ray up with outpatient mental health clinic and to help the Lance Ray to set up an ongoing source of antiepileptics.  On the morning of  the Lance Ray is complaining of dysarthria and difficulty walking. Per PT who is in the room, the Lance Ray is ataxic. I observe him trying to get back in bed. He is very unsteady on his feet. CT and MRI performed and were negative for acute pathology.  Later on 03/27/2020, after he had been seen and cleared by psychiatry, he required a sitter, because he was found by nursing standing in the middle of his bed "trying to fly like superman". Psychiatry asked to return to re-evaluate the Lance Ray. [name] came to re-evaluate the Lance Ray and put him on low dose antipsychotics and anxiolytics.   On 03/29/2020 hospitalist awas called to the Lance Ray's room regarding a security alert. The Lance Ray  had tried to leave and had become combative. He was treated with 1 mg ativan and 5 mg haldol IM. The Lance Ray was requiring physical restraint by nine healthcare workers and Catering managersecurity personnel. He claimed that he was being taken away for a "treatment" which was not the case. His sister arrived and attempted to talk to the Lance Ray and get him to calm down, but she was unsuccessful. The Lance Ray was very combative and violent with personnel  swinging and kicking at them. In my opinion, if this Lance Ray is allowed to leave the premises in his current state he is very likely to come to harm due to his psychotic and delusional state. He is also likely to cause harm to others. CM [name] said that I could not IVC the Lance Ray unless he said that he was going to kill himself, although he is clearly psychotic. She stated that only [name] could IVC the Lance Ray. [name] was called to the floor. I discussed the Lance Ray with him. He did IVC the Lance Ray pending stabilization.  The Lance Ray was transferred to the ICU last night after another outburst requiring a security alert. He is now on a precedex drip. The Lance Ray's sisters are working to obtain guardianship of the Lance Ray. Although it took precedex ultimately to calm the Lance Ray down and address his tachycardia and hypertension, I had not considered ETOH withdrawal due to the Lance Ray given history that he drank 2 beers and to shots of hard liquor a week. This may not have been accurate.  7/9: transfer out of SDU  7/13: Agitated this a.m., given Ativan IV  A & P   Active Problems:   Recurrent seizures (HCC)   Seizure (HCC)   1. Seizures likely secondary to medication noncompliance, stable a. Continue lamictal, keppra PO b. TOC team on board   2. Acute encephalopathy/agitation, likely multifactorial: medication induced, seizures, possible alcohol withdrawal, and most notably underlying psychiatric issues a. Agitated this a.m. given his usual p.o. meds and IV Ativan b. CT brain and MRI brain unremarkable c. Off precedex d. Continue thiamine e. Appreciate psychiatry recommendations  f. Has been IVC'd by psych  3. Alcohol withdrawal, stable a. Doing well off precedex and on current therapies  4. Depression a. Continue current care  5. Hypertension, stable  6. Hypokalemia, resolved  7. SIRS secondary to above, resolved    DVT prophylaxis: enoxaparin (LOVENOX) injection 40 mg  Start: 03/24/20 2315   Family Communication: no family at bedside  Disposition Plan: DC pending PT/OT and Psych Status is: Inpatient  Remains inpatient appropriate because:Unsafe d/c plan   Dispo: The Lance Ray is from: Home              Anticipated d/c is to: TBD              Anticipated d/c date is: 2 days              Lance Ray currently is medically stable to d/c.          Pressure injury documentation    None  Consultants  Psych neuro  Procedures  None  Antibiotics   Anti-infectives (From admission, onward)   None        Subjective   Agitated this morning trying to get out of bed, given Ativan with improved agitation.  Lance Ray is a poor historian unable to provide much history.  Objective   Vitals:   04/05/20 2358 04/06/20 0500 04/06/20 0823 04/06/20 1113  BP: 114/74 125/88 (!) 162/87  125/75  Pulse: 84 79 92 96  Resp: 18 17 17 17   Temp: 98.1 F (36.7 C) 98.1 F (36.7 C)  97.8 F (36.6 C)  TempSrc:    Oral  SpO2: 97% 98% 100% 96%  Weight:      Height:        Intake/Output Summary (Last 24 hours) at 04/06/2020 1526 Last data filed at 04/06/2020 1057 Gross per 24 hour  Intake 480 ml  Output 2280 ml  Net -1800 ml   Filed Weights   03/24/20 2220  Weight: 81.6 kg    Examination:  Physical Exam Vitals and nursing note reviewed. Exam conducted with a chaperone present.  Constitutional:      Appearance: He is not toxic-appearing.     Comments: Follows commands  HENT:     Head: Normocephalic.     Mouth/Throat:     Mouth: Mucous membranes are moist.  Eyes:     Conjunctiva/sclera: Conjunctivae normal.  Cardiovascular:     Rate and Rhythm: Normal rate and regular rhythm.  Pulmonary:     Effort: Pulmonary effort is normal.     Breath sounds: Normal breath sounds.  Abdominal:     General: There is no distension.  Musculoskeletal:     Right lower leg: No edema.     Left lower leg: No edema.     Comments: 5/5 muscle strength diffusely    Neurological:     Mental Status: He is alert.  Psychiatric:        Behavior: Behavior is agitated.     Data Reviewed: I have personally reviewed following labs and imaging studies  CBC: Recent Labs  Lab 03/31/20 0917 04/01/20 0429 04/02/20 0426  WBC 4.8 6.2 5.8  NEUTROABS 3.1  --   --   HGB 11.6* 12.8* 13.1  HCT 33.9* 38.5* 39.1  MCV 88.1 87.9 87.9  PLT 248 324 352   Basic Metabolic Panel: Recent Labs  Lab 03/31/20 0917 04/01/20 0429 04/02/20 0426 04/04/20 0808 04/06/20 0414  NA 142 140 140 141 138  K 3.3* 4.0 3.6 3.2* 4.0  CL 109 107 104 104 105  CO2 22 20* 25 26 27   GLUCOSE 91 78 74 92 91  BUN 18 13 13 15 19   CREATININE 0.96 0.98 0.97 0.94 1.00  CALCIUM 7.9* 8.7* 9.0 8.8* 9.2  MG  --  2.0  --   --   --    GFR: Estimated Creatinine Clearance: 89.4 mL/min (by C-G formula based on SCr of 1 mg/dL). Liver Function Tests: Recent Labs  Lab 03/31/20 0917 04/04/20 0808  AST 56* 17  ALT 16 13  ALKPHOS 69 78  BILITOT 1.0 0.7  PROT 5.9* 6.8  ALBUMIN 3.5 4.3   No results for input(s): LIPASE, AMYLASE in the last 168 hours. Recent Labs  Lab 04/03/20 1314  AMMONIA 28   Coagulation Profile: No results for input(s): INR, PROTIME in the last 168 hours. Cardiac Enzymes: No results for input(s): CKTOTAL, CKMB, CKMBINDEX, TROPONINI in the last 168 hours. BNP (last 3 results) No results for input(s): PROBNP in the last 8760 hours. HbA1C: No results for input(s): HGBA1C in the last 72 hours. CBG: Recent Labs  Lab 03/31/20 0909  GLUCAP 93   Lipid Profile: No results for input(s): CHOL, HDL, LDLCALC, TRIG, CHOLHDL, LDLDIRECT in the last 72 hours. Thyroid Function Tests: No results for input(s): TSH, T4TOTAL, FREET4, T3FREE, THYROIDAB in the last 72 hours. Anemia Panel: No results for input(s): VITAMINB12, FOLATE, FERRITIN,  TIBC, IRON, RETICCTPCT in the last 72 hours. Sepsis Labs: Recent Labs  Lab 03/31/20 0917 03/31/20 1222  LATICACIDVEN 0.6 0.7     Recent Results (from the past 240 hour(s))  Culture, blood (Routine X 2) w Reflex to ID Panel     Status: None   Collection Time: 03/31/20 10:34 AM   Specimen: BLOOD  Result Value Ref Range Status   Specimen Description BLOOD BLOOD RIGHT ARM  Final   Special Requests   Final    BOTTLES DRAWN AEROBIC ONLY Blood Culture results may not be optimal due to an inadequate volume of blood received in culture bottles   Culture   Final    NO GROWTH 5 DAYS Performed at [facility], [address] [place], [place] [address]    Report Status 04/05/2020 FINAL  Final  Culture, blood (Routine X 2) w Reflex to ID Panel     Status: None   Collection Time: 03/31/20 10:41 AM   Specimen: BLOOD  Result Value Ref Range Status   Specimen Description BLOOD LEFT ANTECUBITAL  Final   Special Requests   Final    BOTTLES DRAWN AEROBIC AND ANAEROBIC Blood Culture adequate volume   Culture   Final    NO GROWTH 5 DAYS Performed at [facility], [address] [place], [place] [address]    Report Status 04/05/2020 FINAL  Final         Radiology Studies: No results found.      Scheduled Meds: . benztropine  1 mg Oral BID  . clonazePAM  0.25 mg Oral BID  . dexmedetomidine  1 mcg/kg Intravenous Once  . DULoxetine  20 mg Oral Daily  . enoxaparin (LOVENOX) injection  40 mg Subcutaneous Q24H  . haloperidol  0.25 mg Oral BID  . lamoTRIgine  150 mg Oral BID  . levETIRAcetam  1,000 mg Oral BID  . OLANZapine zydis  7.5 mg Oral QHS  . thiamine  100 mg Oral Daily   Continuous Infusions: . sodium chloride Stopped (04/03/20 0940)     Time spent: 26 minutes with over 50% of the time coordinating the Lance Ray's care    Jae Dire, DO [facility] Pager [phone]  Call night coverage person covering after 7pm

## 2020-04-06 NOTE — BH Assessment (Signed)
Patient can come down after 8pm  Call to give report: [phone]  Patient is to be [facility] byDr. [name].  Attending Physician will be.Dr. Toni Amend.  Patient has [facility] room[facility] Charge NurseDemetria  Intake Paper Work has been signed and placed on patient chart.   Case manager Barbera Setters made aware of acceptance (Agree to communicate acceptance to current medical team)

## 2020-04-06 NOTE — Discharge Summary (Signed)
Physician Discharge Summary  Lance Ray:[id number] DOB: 01/23/1971   PCP: Patient, No Pcp Per  Admit date: 03/24/2020 Discharge date: 04/06/2020 Length of Stay: 12 days   Code Status: Full Code  Admitted From:  Home Discharged to:  [facility] Discharge Condition:  Stable   Hospital Summary  [name], who has not been on his Depakote for months and presented to the emergency room with acute onset of witnessedgeneralized tonic-clonicseizure at [facility]. He was seen earlier in the ER, observed in the ER for 5 hours without recurrent seizure activityand given a prescription for Depakote ERand dischargedin a good Rx coupon. He had a recurrent tonic-clonicseizure in the parking lot that brought him back to the ER.He denied any tongue bites. He was fairly somnolent during my interview after being given IV Ativan and Keppra but was arousable. He denied any headache or dizziness or blurred vision. No paresthesias or focal muscle weakness. He stated that he has not taken his Depakote for 6 months and Lamictal for 2 weeks. No chest pain or dyspnea or cough or wheezing. No nausea or vomiting or abdominal pain. No fever or chills.  Upon presentation to the emergency room, blood pressure was 141/88 with otherwise normal vital signs. Earlier it was 128/89. Labs reviewed potassium of 3.6 with otherwise normal BMP and CBC was within normal. Noncontrasted head CT scan revealed no acute intracranial abnormalities.EKG showed sinus tachycardia with a rate of 115 with probable left atrial enlargement.  The patient was given 1 mg of IV lorazepam and was loaded with 1 g of IV Keppra. He was admitted to an observation PCU bed for further evaluation and management.  [facility] were consulted to admit the patient for further evaluation and treatment. The patient had been taking depakote for many years  according to his sister. She stated that she did not think that it worked well. She understood that when the patient presented to [facility] with a seizure a while ago that was why they had loaded the patient with keppra and started him on that. According to the sister the pattern is that the patient will be seen at a hospital ED after a seizure. He will be set up with one month of meds, and then he will stop taking his meds when he runs out. He will not act in his own self interest to maintain his health. They have looked into getting medicaid for him, but the patient is not cooperative with the process.  Psychiatry has been consulted to evaluate the patient's depression. Transitions of Care has been consulted to help set the patient up with outpatient mental health clinic and to help the patient to set up an ongoing source of antiepileptics.  On the morning of the patient is complaining of dysarthria and difficulty walking. Per PT who is in the room, the patient is ataxic. I observe him trying to get back in bed. He is very unsteady on his feet. CT and MRI performed and were negative for acute pathology.  Later on 03/27/2020, after he had been seen and cleared by psychiatry, he required a sitter, because he was found by nursing standing in the middle of his bed "trying to fly like superman". Psychiatry asked to return to re-evaluate the patient. [name] came to re-evaluate the patient and put him on low dose antipsychotics and anxiolytics.  On 7/5/2021hospitalist awas called to the patient's room regarding a security alert. The patient had  tried to leave and had become combative. He was treated with 1 mg ativan and 5 mg haldol IM. The patient was requiring physical restraint by nine healthcare workers and Catering manager. He claimed that he was being taken away for a "treatment" which was not the case. His sister arrived and attempted to talk to the patient and get him to calm down, but she was  unsuccessful. The patient was very combative and violent with personnel swinging and kicking at them. In my opinion, if this patient is allowed to leave the premises in his current state he is very likely to come to harm due to his psychotic and delusional state. He is also likely to cause harm to others. CM [name] said that I could not IVC the patient unless he said that he was going to kill himself, although he is clearly psychotic. She stated that only [name] could IVC the patient. [name] was called to the floor. I discussed the patient with him.He did IVC the patient pending stabilization.  The patient was transferred to the ICU last night after another outburst requiring a security alert. He is now on a precedex drip. The patient's sisters are working to obtain guardianship of the patient. Although it took precedex ultimately to calm the patient down and address his tachycardia and hypertension, I had not considered ETOH withdrawal due to the patient given history that he drank 2 beers and to shots of hard liquor a week. This may not have been accurate.  7/9: transfer out of SDU  7/13: Agitated this a.m., given Ativan IV and had improved symptoms. Discharged in medically stable condition to behavioral health.   A & P   Active Problems:   Recurrent seizures (HCC)   Seizure (HCC)    1. Seizures likely secondary to medication noncompliance, stable a. Continue lamictal, keppra PO   2. Acute encephalopathy/agitation, likely multifactorial: medication induced, seizures, possible alcohol withdrawal, and most notably underlying psychiatric issues a. Agitated this a.m. given his usual p.o. meds and IV Ativan b. CT brain and MRI brain unremarkable c. Off precedex d. Continue thiamine e. Current PO meds: Cogentin, Klonopin, Cymbalta, Haldol and Zyprexa continued at discharge f. Has been IVC'd by psych  3. Alcohol withdrawal, stable a. Doing well off precedex and on current  therapies  4. Depression a. Continue current care  5. Hypertension, stable  6. Hypokalemia, resolved  7. SIRS secondary to above, resolved      Consultants  . Psych . Neuro  Procedures  . None  Antibiotics   Anti-infectives (From admission, onward)   None       Subjective   Agitated this morning given Ativan.  See progress note  Objective   Discharge Exam: Vitals:   04/06/20 1113 04/06/20 1641  BP: 125/75 118/87  Pulse: 96 78  Resp: 17 17  Temp: 97.8 F (36.6 C) 98.4 F (36.9 C)  SpO2: 96% 98%   Vitals:   04/06/20 0500 04/06/20 0823 04/06/20 1113 04/06/20 1641  BP: 125/88 (!) 162/87 125/75 118/87  Pulse: 79 92 96 78  Resp: 17 17 17 17   Temp: 98.1 F (36.7 C)  97.8 F (36.6 C) 98.4 F (36.9 C)  TempSrc:   Oral   SpO2: 98% 100% 96% 98%  Weight:      Height:        Physical Exam Vitals and nursing note reviewed. Exam conducted with a chaperone present.  Constitutional:      Comments: Follows commands  Eyes:     Conjunctiva/sclera: Conjunctivae normal.  Pulmonary:     Effort: Pulmonary effort is normal.     Breath sounds: Normal breath sounds.  Abdominal:     General: Abdomen is flat. There is no distension.  Musculoskeletal:        General: No swelling or tenderness. Normal range of motion.  Neurological:     Mental Status: He is alert.  Psychiatric:     Comments: Agitated       The results of significant diagnostics from this hospitalization (including imaging, microbiology, ancillary and laboratory) are listed below for reference.     Microbiology: Recent Results (from the past 240 hour(s))  Culture, blood (Routine X 2) w Reflex to ID Panel     Status: None   Collection Time: 03/31/20 10:34 AM   Specimen: BLOOD  Result Value Ref Range Status   Specimen Description BLOOD BLOOD RIGHT ARM  Final   Special Requests   Final    BOTTLES DRAWN AEROBIC ONLY Blood Culture results may not be optimal due to an inadequate volume of  blood received in culture bottles   Culture   Final    NO GROWTH 5 DAYS Performed at [facility], [address], [place], [place] [address]    Report Status 04/05/2020 FINAL  Final  Culture, blood (Routine X 2) w Reflex to ID Panel     Status: None   Collection Time: 03/31/20 10:41 AM   Specimen: BLOOD  Result Value Ref Range Status   Specimen Description BLOOD LEFT ANTECUBITAL  Final   Special Requests   Final    BOTTLES DRAWN AEROBIC AND ANAEROBIC Blood Culture adequate volume   Culture   Final    NO GROWTH 5 DAYS Performed at [facility], [address], [place], [place] [address]    Report Status 04/05/2020 FINAL  Final     Labs: BNP (last 3 results) No results for input(s): BNP in the last 8760 hours. Basic Metabolic Panel: Recent Labs  Lab 03/31/20 0917 04/01/20 0429 04/02/20 0426 04/04/20 0808 04/06/20 0414  NA 142 140 140 141 138  K 3.3* 4.0 3.6 3.2* 4.0  CL 109 107 104 104 105  CO2 22 20* 25 26 27   GLUCOSE 91 78 74 92 91  BUN 18 13 13 15 19   CREATININE 0.96 0.98 0.97 0.94 1.00  CALCIUM 7.9* 8.7* 9.0 8.8* 9.2  MG  --  2.0  --   --   --    Liver Function Tests: Recent Labs  Lab 03/31/20 0917 04/04/20 0808  AST 56* 17  ALT 16 13  ALKPHOS 69 78  BILITOT 1.0 0.7  PROT 5.9* 6.8  ALBUMIN 3.5 4.3   No results for input(s): LIPASE, AMYLASE in the last 168 hours. Recent Labs  Lab 04/03/20 1314  AMMONIA 28   CBC: Recent Labs  Lab 03/31/20 0917 04/01/20 0429 04/02/20 0426  WBC 4.8 6.2 5.8  NEUTROABS 3.1  --   --   HGB 11.6* 12.8* 13.1  HCT 33.9* 38.5* 39.1  MCV 88.1 87.9 87.9  PLT 248 324 352   Cardiac Enzymes: No results for input(s): CKTOTAL, CKMB, CKMBINDEX, TROPONINI in the last 168 hours. BNP: Invalid input(s): POCBNP CBG: Recent Labs  Lab 03/31/20 0909  GLUCAP 93   D-Dimer No results for input(s): DDIMER in the last 72 hours. Hgb A1c No results for input(s): HGBA1C in the last 72 hours. Lipid Profile No  results for input(s): CHOL,  HDL, LDLCALC, TRIG, CHOLHDL, LDLDIRECT in the last 72 hours. Thyroid function studies No results for input(s): TSH, T4TOTAL, T3FREE, THYROIDAB in the last 72 hours.  Invalid input(s): FREET3 Anemia work up No results for input(s): VITAMINB12, FOLATE, FERRITIN, TIBC, IRON, RETICCTPCT in the last 72 hours. Urinalysis    Component Value Date/Time   COLORURINE YELLOW (A) 05/27/2016 0200   APPEARANCEUR CLEAR (A) 05/27/2016 0200   LABSPEC 1.020 05/27/2016 0200   PHURINE 5.0 05/27/2016 0200   GLUCOSEU NEGATIVE 05/27/2016 0200   HGBUR NEGATIVE 05/27/2016 0200   BILIRUBINUR NEGATIVE 05/27/2016 0200   KETONESUR TRACE (A) 05/27/2016 0200   PROTEINUR 100 (A) 05/27/2016 0200   NITRITE NEGATIVE 05/27/2016 0200   LEUKOCYTESUR NEGATIVE 05/27/2016 0200   Sepsis Labs Invalid input(s): PROCALCITONIN,  WBC,  LACTICIDVEN Microbiology Recent Results (from the past 240 hour(s))  Culture, blood (Routine X 2) w Reflex to ID Panel     Status: None   Collection Time: 03/31/20 10:34 AM   Specimen: BLOOD  Result Value Ref Range Status   Specimen Description BLOOD BLOOD RIGHT ARM  Final   Special Requests   Final    BOTTLES DRAWN AEROBIC ONLY Blood Culture results may not be optimal due to an inadequate volume of blood received in culture bottles   Culture   Final    NO GROWTH 5 DAYS Performed at [facility], [address] [place], [place] [address]    Report Status 04/05/2020 FINAL  Final  Culture, blood (Routine X 2) w Reflex to ID Panel     Status: None   Collection Time: 03/31/20 10:41 AM   Specimen: BLOOD  Result Value Ref Range Status   Specimen Description BLOOD LEFT ANTECUBITAL  Final   Special Requests   Final    BOTTLES DRAWN AEROBIC AND ANAEROBIC Blood Culture adequate volume   Culture   Final    NO GROWTH 5 DAYS Performed at [facility], [address] [place], [place] [address]    Report Status 04/05/2020 FINAL  Final     Discharge Instructions     Discharge Instructions    Diet - low sodium heart healthy   Complete by: As directed      Allergies as of 04/06/2020   No Known Allergies     Medication List    STOP taking these medications   divalproex 500 MG 24 hr tablet Commonly known as: DEPAKOTE ER     TAKE these medications   benztropine 1 MG tablet Commonly known as: COGENTIN Take 1 tablet (1 mg total) by mouth 2 (two) times daily.   clonazePAM 0.25 MG disintegrating tablet Commonly known as: KLONOPIN Take 1 tablet (0.25 mg total) by mouth 2 (two) times daily.   DULoxetine 30 MG capsule Commonly known as: CYMBALTA Take 1 capsule (30 mg total) by mouth daily. Start taking on: April 07, 2020   haloperidol 0.5 MG tablet Commonly known as: HALDOL Take 0.5 tablets (0.25 mg total) by mouth 2 (two) times daily.   lamoTRIgine 150 MG tablet Commonly known as: LAMICTAL Take 1 tablet (150 mg total) by mouth 2 (two) times daily. What changed:   medication strength  how much to take   levETIRAcetam 1000 MG tablet Commonly known as: KEPPRA Take 1 tablet (1,000 mg total) by mouth 2 (two) times daily.   OLANZapine zydis 5 MG disintegrating tablet Commonly known as: ZYPREXA Take 1.5 tablets (7.5 mg total) by mouth at bedtime.   thiamine 100 MG tablet Take  1 tablet (100 mg total) by mouth daily. Start taking on: April 07, 2020       No Known Allergies   Dispo: The patient is from: Home              Anticipated d/c is to: Behavioral health              Anticipated d/c date ZO:XWRUEis:today              Patient currently is medically stable to d/c.       Time coordinating discharge: Over 30 minutes   SIGNED:   Jae DireJared E Dung Salinger, D.O. [facility] Pager: [phone]  04/06/2020, 5:48 PM

## 2020-04-06 NOTE — Progress Notes (Signed)
Patient to be transferred to behavior health dept.  Pt is unable to safely independently.  Webb Silversmith NP aware  And decision made to hold transfer at this time.  IVC ended due to expiration date. Will keep safety sitter

## 2020-04-06 NOTE — TOC Progression Note (Signed)
Transition of Care [facility]) - Progression Note    Patient Details  Name: Lance Ray MRN: [id number] Date of Birth: [birth year]  Transition of Care [facility]) CM/SW Contact  Allayne Butcher, RN Phone Number: 04/06/2020, 3:33 PM  Clinical Narrative:    This RNCM reached out to TTS about discharging patient to behavioral medicine.  TTS reports that they will contact the psychiatrist and then call [facility] team back about plan.     Expected Discharge Plan: Home/Self Care Barriers to Discharge: Continued Medical Work up  Expected Discharge Plan and Services Expected Discharge Plan: Home/Self Care   Discharge Planning Services: CM Consult, Medication Assistance, Indigent Health Clinic   Living arrangements for the past 2 months: Single Family Home                                       Social Determinants of Health (SDOH) Interventions    Readmission Risk Interventions Readmission Risk Prevention Plan 03/29/2020  Transportation Screening Complete  HRI or Home Care Consult Complete  Social Work Consult for Recovery Care Planning/Counseling Complete  Palliative Care Screening Not Applicable  Medication Review Oceanographer) Referral to Pharmacy  Some recent data might be hidden

## 2020-04-06 NOTE — TOC Transition Note (Addendum)
Transition of Care [facility]) - CM/SW Discharge Note   Patient Details  Name: Lance Ray MRN: [id number] Date of Birth: [birth year]  Transition of Care [facility]) CM/SW Contact:  Allayne Butcher, RN Phone Number: 04/06/2020, 4:16 PM   Clinical Narrative:    Patient will discharge to the BMU after 8 pm tonight.  Bedside RN is aware of room number and number to call report.  MD aware and will write discharge orders..    Final next level of care: Other (comment) (Behavioral Health Unit [facility]) Barriers to Discharge: Barriers Resolved   Patient Goals and CMS Choice Patient states their goals for this hospitalization and ongoing recovery are:: To get out of here      Discharge Placement                       Discharge Plan and Services   Discharge Planning Services: CM Consult, Medication Assistance, Indigent Health Clinic                                 Social Determinants of Health (SDOH) Interventions     Readmission Risk Interventions Readmission Risk Prevention Plan 03/29/2020  Transportation Screening Complete  HRI or Home Care Consult Complete  Social Work Consult for Recovery Care Planning/Counseling Complete  Palliative Care Screening Not Applicable  Medication Review Oceanographer) Referral to Pharmacy  Some recent data might be hidden

## 2020-04-07 DIAGNOSIS — I10 Essential (primary) hypertension: Secondary | ICD-10-CM

## 2020-04-07 NOTE — Progress Notes (Signed)
PROGRESS NOTE    [name]  ZGY:[id number] DOB: 04-02-1971 DOA: 03/24/2020 PCP: Lance Ray, No Pcp Per    Chief Complaint  Lance Ray presents with  . Seizures    Brief Narrative: [name], who has not been on his Depakote for months and presented to the emergency room with acute onset of witnessedgeneralized tonic-clonicseizure at [facility]. He was loaded with IV lorazepam and keppra and admitted to PCU. PSYCHIATRY consulted for depression.  Meanwhile a CT head and MRI brain were neg for acute pathology. Hospital course was complicated by Lance Ray's psychosis and agitation, required precedex gtt for agitation, in and out of SDU. He is currently medically stable for discharge to [facility], but unable to go to [facility] as he is requiring walker and assistance to ambulate. He will probably need CIR prior to transfer to [facility]. TOC on board to assist with discharge.   Assessment & Plan:   Active Problems:   Recurrent seizures (HCC)   Seizure (HCC)   Acute metabolic encephalopathy:  Probably multifactorial, medication induced, alcohol withdrawal, seizures, psychosis , adjustment disorder.  Much improved.  CT and MRI unremarkable.  Resume cogentin, klonopin, Cymbalta, haldol and Zyprexa.     Seizures:  Sec to non compliance to meds.  Stable.  Resume Lamictal and keppra.     Depression.  Resume cymbalta.    Hypertension:  Better controlled.     Hypokalemia; resolved.        DVT prophylaxis: (Lovenox) Code Status: full code.  Family Communication: none at bedside.  Disposition:   Status is: Inpatient  Remains inpatient appropriate because:Unsafe d/c plan   Dispo: The Lance Ray is from: Home              Anticipated d/c is to: CIR              Anticipated d/c date is: 1 day              Lance Ray currently is medically stable to d/c.       Consultants:   Psychiatry.   Procedures:  none.   Antimicrobials: none.    Subjective: No complaints.   Objective: Vitals:   04/06/20 2310 04/07/20 0454 04/07/20 0759 04/07/20 1312  BP: 125/83 115/81 115/86 114/79  Pulse: 96 90 88 (!) 104  Resp: 18 18 18 18   Temp: 98.4 F (36.9 C) 98.4 F (36.9 C) 98.6 F (37 C) 98.2 F (36.8 C)  TempSrc: Oral Oral Oral Oral  SpO2: 97% 97% 97% 97%  Weight:      Height:        Intake/Output Summary (Last 24 hours) at 04/07/2020 1721 Last data filed at 04/06/2020 1900 Gross per 24 hour  Intake --  Output 750 ml  Net -750 ml   Filed Weights   03/24/20 2220  Weight: 81.6 kg    Examination:  General exam: Appears calm and comfortable  Respiratory system: Clear to auscultation. Respiratory effort normal. Cardiovascular system: S1 & S2 heard, tachycardic. . . No pedal edema. Gastrointestinal system: Abdomen is nondistended, soft and nontender.Normal bowel sounds heard. Central nervous system: Alert and oriented person, grossly non focal.  Extremities: no pedal edema.  Skin: No rashes, lesions or ulcers Psychiatry: flat affect.     Data Reviewed: I have personally reviewed following labs and imaging studies  CBC: Recent Labs  Lab 04/01/20 0429 04/02/20 0426  WBC 6.2 5.8  HGB 12.8* 13.1  HCT 38.5* 39.1  MCV  87.9 87.9  PLT 324 352    Basic Metabolic Panel: Recent Labs  Lab 04/01/20 0429 04/02/20 0426 04/04/20 0808 04/06/20 0414  NA 140 140 141 138  K 4.0 3.6 3.2* 4.0  CL 107 104 104 105  CO2 20* 25 26 27   GLUCOSE 78 74 92 91  BUN 13 13 15 19   CREATININE 0.98 0.97 0.94 1.00  CALCIUM 8.7* 9.0 8.8* 9.2  MG 2.0  --   --   --     GFR: Estimated Creatinine Clearance: 89.4 mL/min (by C-G formula based on SCr of 1 mg/dL).  Liver Function Tests: Recent Labs  Lab 04/04/20 0808  AST 17  ALT 13  ALKPHOS 78  BILITOT 0.7  PROT 6.8  ALBUMIN 4.3    CBG: No results for input(s): GLUCAP in the last 168 hours.   Recent Results (from the past 240 hour(s))   Culture, blood (Routine X 2) w Reflex to ID Panel     Status: None   Collection Time: 03/31/20 10:34 AM   Specimen: BLOOD  Result Value Ref Range Status   Specimen Description BLOOD BLOOD RIGHT ARM  Final   Special Requests   Final    BOTTLES DRAWN AEROBIC ONLY Blood Culture results may not be optimal due to an inadequate volume of blood received in culture bottles   Culture   Final    NO GROWTH 5 DAYS Performed at [facility], [address], [place], [address] [place]    Report Status 04/05/2020 FINAL  Final  Culture, blood (Routine X 2) w Reflex to ID Panel     Status: None   Collection Time: 03/31/20 10:41 AM   Specimen: BLOOD  Result Value Ref Range Status   Specimen Description BLOOD LEFT ANTECUBITAL  Final   Special Requests   Final    BOTTLES DRAWN AEROBIC AND ANAEROBIC Blood Culture adequate volume   Culture   Final    NO GROWTH 5 DAYS Performed at [facility], [address] [place], [address] [place]    Report Status 04/05/2020 FINAL  Final         Radiology Studies: No results found.      Scheduled Meds: . benztropine  1 mg Oral BID  . clonazePAM  0.25 mg Oral BID  . dexmedetomidine  1 mcg/kg Intravenous Once  . DULoxetine  30 mg Oral Daily  . enoxaparin (LOVENOX) injection  40 mg Subcutaneous Q24H  . haloperidol  0.25 mg Oral BID  . lamoTRIgine  150 mg Oral BID  . levETIRAcetam  1,000 mg Oral BID  . OLANZapine zydis  7.5 mg Oral QHS  . thiamine  100 mg Oral Daily   Continuous Infusions: . sodium chloride Stopped (04/03/20 0940)     LOS: 13 days       06/06/2020, MD [facility]   To contact the attending provider between 7A-7P or the covering provider during after hours 7P-7A, please log into the web site [email or web address] and access using universal [place] password for that web site. If you do not have the password, please call the hospital operator.  04/07/2020, 5:21 PM

## 2020-04-07 NOTE — Progress Notes (Signed)
Physical Therapy Treatment Patient Details Name: Lance Ray MRN: [id number] DOB: 12-24-1970 Today's Date: 04/07/2020    History of Present Illness 49 y.o. male with history of seizure disorder, who has not been on his Depakote for months and presented to the emergency room with acute onset of witnessed generalized tonic-clonic seizure, apparently he was discharged and had another seizure in the parking lot and returned, OT re-eval completed in CCU (7/9)    PT Comments    Pt was supine in bed upon arriving with sitter at bedside. He agrees to PT session and is cooperative however impulsive with poor safety awareness. He reports no pain. Was able to exit R side of bed with supervision + max vcs for safety and slowing down. He sat EOB with varied amount of assistance required 2/2 to slight tremors/ impulsivity. Stood to [employer] and ambulated 50 ft with min-mod assist + chair follow. PT is a very high fall risk with ataxic gait sequencing and poor ability to correct. HR elevated to 140 but with seated rest resolves to low 100s. Overall he tolerated well but will need continued skilled PT to address strength, balance, and safe functional mobility deficits. Per discussion with CM, pt will need to be independent with at least getting to/from BR prior to being able to DC to BMU. Acute PT will continue efforts to progress towards rehab goals. Pt was returned to bed with sitter at bedside at conclusion of session.    Follow Up Recommendations  Supervision/Assistance - 24 hour;Supervision for mobility/OOB;Other (comment);CIR (pt needs BHU at DC for continued psych/ observation)     Equipment Recommendations  Rolling walker with 5" wheels    Recommendations for Other Services       Precautions / Restrictions Precautions Precautions: Fall Restrictions Weight Bearing Restrictions: No    Mobility  Bed Mobility Overal bed mobility: Needs Assistance Bed Mobility: Supine to Sit;Sit to Supine      Supine to sit: Supervision Sit to supine: Supervision   General bed mobility comments: Pt was able to exit bed without physical assistance however pt requires max vcs for safety and slow down 2/2 to impulsivity  Transfers Overall transfer level: Needs assistance Equipment used: Rolling walker (2 wheeled) Transfers: Sit to/from Stand Sit to Stand: Min guard;Min assist         General transfer comment: CGA to stand from bed but min assist to sit with eccentric control  Ambulation/Gait Ambulation/Gait assistance: Min assist;Mod assist Gait Distance (Feet): 50 Feet Assistive device: Rolling walker (2 wheeled) Gait Pattern/deviations: Ataxic;Scissoring;Narrow base of support;Staggering left;Staggering right Gait velocity: decreased   General Gait Details: Pt was able to ambulate 2 x 50 ft with very unsteady gait kinematics. very high fall risk. ataxic movements and poor safety awareness throughout. chair follow for safety.    Stairs             Wheelchair Mobility    Modified Rankin (Stroke Patients Only)       Balance Overall balance assessment: Needs assistance Sitting-balance support: Bilateral upper extremity supported;Feet supported Sitting balance-Leahy Scale: Fair Sitting balance - Comments: due to impulsivity and ataxic movements, high fall risk even in sitting.   Standing balance support: Bilateral upper extremity supported Standing balance-Leahy Scale: Poor Standing balance comment: Pt was very unsteady throughout all static/dynamic standing activities. High fall risk.                            Cognition  Arousal/Alertness: Awake/alert Behavior During Therapy: Impulsive;Anxious;Restless Overall Cognitive Status: Difficult to assess Area of Impairment: Orientation;Following commands;Safety/judgement;Awareness;Problem solving;Memory;Attention                 Orientation Level: Situation;Time;Place Current Attention Level:  Focused;Alternating Memory: Decreased recall of precautions;Decreased short-term memory Following Commands: Follows multi-step commands inconsistently;Follows one step commands consistently Safety/Judgement: Decreased awareness of safety;Decreased awareness of deficits Awareness: Intellectual Problem Solving: Slow processing;Requires verbal cues;Requires tactile cues;Difficulty sequencing General Comments: pt is A but disoriented. was able to follow commands with increased time and vcs/tactile cues      Exercises      General Comments        Pertinent Vitals/Pain Pain Assessment: No/denies pain    Home Living                      Prior Function            PT Goals (current goals can now be found in the care plan section) Acute Rehab PT Goals Patient Stated Goal: to go home to his cat Progress towards PT goals: Progressing toward goals    Frequency    Min 2X/week      PT Plan Current plan remains appropriate    Co-evaluation              AM-PAC PT "6 Clicks" Mobility   Outcome Measure  Help needed turning from your back to your side while in a flat bed without using bedrails?: A Little Help needed moving from lying on your back to sitting on the side of a flat bed without using bedrails?: A Little Help needed moving to and from a bed to a chair (including a wheelchair)?: A Lot Help needed standing up from a chair using your arms (e.g., wheelchair or bedside chair)?: A Lot Help needed to walk in hospital room?: A Lot Help needed climbing 3-5 steps with a railing? : A Lot 6 Click Score: 14    End of Session Equipment Utilized During Treatment: Gait belt Activity Tolerance: Patient tolerated treatment well Patient left: with nursing/sitter in room;in bed;with call bell/phone within reach (1 on 1 sitter present) Nurse Communication: Mobility status PT Visit Diagnosis: Unsteadiness on feet (R26.81);Muscle weakness (generalized) (M62.81);Other  abnormalities of gait and mobility (R26.89);Ataxic gait (R26.0)     Time: 0962-8366 PT Time Calculation (min) (ACUTE ONLY): 26 min  Charges:  $Gait Training: 8-22 mins $Therapeutic Activity: 8-22 mins                     Jetta Lout PTA 04/07/20, 1:24 PM

## 2020-04-07 NOTE — TOC Progression Note (Signed)
Transition of Care [facility]) - Progression Note    Patient Details  Name: Lance Ray MRN: [id number] Date of Birth: [birth year]  Transition [facility] CM/SW Contact  Allayne Butcher, RN Phone Number: 04/07/2020, 4:10 PM  Clinical Narrative:    Patient is unable to ambulate independently and needs to work with PT more before going to inpatient psych.  RNCM will cont to follow daily.    Expected Discharge Plan: Home/Self Care Barriers to Discharge: Continued Medical Work up (needs further PT)  Expected Discharge Plan and Services Expected Discharge Plan: Home/Self Care   Discharge Planning Services: CM Consult, Medication Assistance, Indigent Health Clinic   Living arrangements for the past 2 months: Single Family Home Expected Discharge Date: 04/06/20                                     Social Determinants of Health (SDOH) Interventions    Readmission Risk Interventions Readmission Risk Prevention Plan 03/29/2020  Transportation Screening Complete  HRI or Home Care Consult Complete  Social Work Consult for Recovery Care Planning/Counseling Complete  Palliative Care Screening Not Applicable  Medication Review Oceanographer) Referral to Pharmacy  Some recent data might be hidden

## 2020-04-08 MED ORDER — HALOPERIDOL 0.5 MG PO TABS
0.5000 mg | ORAL_TABLET | Freq: Two times a day (BID) | ORAL | Status: DC
  Administered 2020-04-08 – 2020-04-09 (×2): 0.5 mg via ORAL
  Filled 2020-04-08 (×3): qty 1

## 2020-04-08 MED ORDER — OLANZAPINE 10 MG PO TBDP
10.0000 mg | ORAL_TABLET | Freq: Every day | ORAL | Status: DC
  Administered 2020-04-08: 10 mg via ORAL
  Filled 2020-04-08 (×2): qty 1

## 2020-04-08 MED ORDER — HALOPERIDOL LACTATE 5 MG/ML IJ SOLN
5.0000 mg | Freq: Once | INTRAMUSCULAR | Status: AC
  Administered 2020-04-09: 01:00:00 5 mg via INTRAMUSCULAR
  Filled 2020-04-08: qty 1

## 2020-04-08 NOTE — Progress Notes (Signed)
Patient ID: Lance Ray, male   DOB: Jul 31, 1971, 49 y.o.   MRN: [id number]    Brief Progress Note Psych  Patient was seen today  He remains on IVC for five days, then renew  Ready for IM discharge.  But we have no psych beds --need to refer to SW -------  He is still in strange psychotic state.  Seizures are controlled but he is illogical disorganized and paranoid.  He has no balanced judgement insight and reliability  He is oriented to name and part of place  Thought process and content --see above  No active SI and HI but remains on IVC due to risk of clinical deterioration if discharged.  Appearance looks strange, disheveled and unkept -- No side effects or movement problems   Awaits psych beds and current Psych meds to stay on board

## 2020-04-08 NOTE — Progress Notes (Signed)
Occupational Therapy Treatment Patient Details Name: Lance Ray MRN: [id number] DOB: [birth year] Today's Date: 04/08/2020    History of present illness 49 y.o. male with history of seizure disorder, who has not been on his Depakote for months and presented to the emergency room with acute onset of witnessed generalized tonic-clonic seizure, apparently he was discharged and had another seizure in the parking lot and returned, OT re-eval completed in CCU (7/9)   OT comments  Lance Ray continues to be limited by his balance and ataxia in functional tasks.  Pt was pleasant and agreeable to today's tx focused on self care.  He is disoriented to current date, as well as impulsive and with tangential, loosely associated speech.  Pt presents ataxic and uncoordinated in his movements.  OTR provided supervision assist for pt to complete bed mobility, but he requires max verbal cues for safety and sequencing.  Pt required max assist throughout entire session while seated EOB.  Pt is able to correct to upright posture when prompted, but he quickly returns to posterior lean.  OTR provided max assist for seated balance while nursing held supplies as pt brushed teeth and washed face while seated EOB.  He presents with ataxia and BUE tremors with movement.  OTR and nursing provided CGA x2 (for safety) for pt to stand and take side steps towards HOB.  He requires varying levels of assist to maintain balance, from max assist x2 to min assist to correct posterior lean.  OTR provided verbal cues for safety, sequencing, and eccentric control in stand > sit > supine.  Lance Ray will continue to benefit from skilled OT services in acute setting to address balance, safety, and independence in ADLs and meaningful occupational participation.     Follow Up Recommendations  Other (comment) (pending progress likely w/ transition to behavioral unit pre-D/C)    Equipment Recommendations  Other (comment) (TBD pending  progress)    Recommendations for Other Services      Precautions / Restrictions Precautions Precautions: Fall Restrictions Weight Bearing Restrictions: No       Mobility Bed Mobility Overal bed mobility: Needs Assistance Bed Mobility: Supine to Sit;Sit to Supine     Supine to sit: Supervision Sit to supine: Supervision   General bed mobility comments: Pt was able to exit bed without physical assistance however pt requires max vcs for safety and slow down 2/2 to impulsivity  Transfers Overall transfer level: Needs assistance   Transfers: Sit to/from Stand Sit to Stand: Min assist;+2 safety/equipment         General transfer comment: OTR provided min assist x2 for stand from bed and sit with eccentric control.  Pt then requires max assist to maintain balance in standing.    Balance Overall balance assessment: Needs assistance Sitting-balance support: No upper extremity supported;Feet supported Sitting balance-Leahy Scale: Zero Sitting balance - Comments: Pt required max assist throughout for seated balance 2/2 posterior lean.  Pt able to achieve upright posture with max verbal cues, but quickly returns to posterior lean. Postural control: Posterior lean Standing balance support: Bilateral upper extremity supported Standing balance-Leahy Scale: Poor Standing balance comment: Pt was very unsteady throughout all static/dynamic standing activities. High fall risk.                           ADL either performed or assessed with clinical judgement   ADL Overall ADL's : Needs assistance/impaired Eating/Feeding: Minimal assistance;Bed level;Cueing for sequencing;Cueing for safety Eating/Feeding  Details (indicate cue type and reason): HOB elevated Grooming: Wash/dry face;Oral care;Moderate assistance;Cueing for sequencing;Cueing for safety Grooming Details (indicate cue type and reason): OTR provided max assist for seated balance while pt sat EOB for grooming tasks.   Pt required assist to hold tray/extra supplies while he brushed his teeth and washed face.                             Functional mobility during ADLs: Maximal assistance;+2 for safety/equipment General ADL Comments: Pt very limited by balance and posterior lean in ADLs.  Pt required max assist to maintain balance in sitting and standing.  Pt able to correct balance with verbal cues, but quickly returns to posterior lean position.     Vision Patient Visual Report: No change from baseline     Perception     Praxis      Cognition Arousal/Alertness: Awake/alert Behavior During Therapy: Impulsive;Anxious;Restless Overall Cognitive Status: Difficult to assess Area of Impairment: Orientation;Following commands;Safety/judgement;Awareness;Problem solving;Memory;Attention                 Orientation Level: Disoriented to;Time (pt unable to state date, states January, then June) Current Attention Level: Focused;Alternating Memory: Decreased recall of precautions;Decreased short-term memory Following Commands: Follows multi-step commands inconsistently;Follows one step commands consistently Safety/Judgement: Decreased awareness of safety;Decreased awareness of deficits Awareness: Intellectual Problem Solving: Slow processing;Requires verbal cues;Requires tactile cues;Difficulty sequencing General Comments: pt is A but disoriented. was able to follow commands with increased time and vcs/tactile cues        Exercises Other Exercises Other Exercises: provided education re: OT role and plan of care, fall and safety precautions, self care, functional transfers, importance of engagement in OOB mobility and meaningful occupations   Shoulder Instructions       General Comments      Pertinent Vitals/ Pain       Pain Assessment: No/denies pain  Home Living                                          Prior Functioning/Environment               Frequency  Min 2X/week        Progress Toward Goals  OT Goals(current goals can now be found in the care plan section)  Progress towards OT goals: Progressing toward goals  Acute Rehab OT Goals Patient Stated Goal: to go home to his cat OT Goal Formulation: With patient Time For Goal Achievement: 04/16/20 Potential to Achieve Goals: Fair  Plan Discharge plan remains appropriate;Frequency remains appropriate    Co-evaluation                 AM-PAC OT "6 Clicks" Daily Activity     Outcome Measure   Help from another person eating meals?: A Little Help from another person taking care of personal grooming?: A Little Help from another person toileting, which includes using toliet, bedpan, or urinal?: A Lot Help from another person bathing (including washing, rinsing, drying)?: A Lot Help from another person to put on and taking off regular upper body clothing?: A Little Help from another person to put on and taking off regular lower body clothing?: A Lot 6 Click Score: 15    End of Session Equipment Utilized During Treatment: Gait belt  OT Visit Diagnosis: Unsteadiness on feet (R26.81);Ataxia, unspecified (  R27.0);Other symptoms and signs involving cognitive function   Activity Tolerance Patient tolerated treatment well   Patient Left in bed;with call bell/phone within reach;with nursing/sitter in room   Nurse Communication          Time: 9030-0923 OT Time Calculation (min): 25 min  Charges: OT General Charges $OT Visit: 1 Visit OT Treatments $Self Care/Home Management : 23-37 mins  Kathyrn Drown Lilyana Lippman, OTR/L 04/08/20, 4:43 PM

## 2020-04-08 NOTE — TOC Progression Note (Signed)
Transition of Care [facility]) - Progression Note    Patient Details  Name: Lance Ray MRN: [id number] Date of Birth: Feb 26, 1971  Transition of Care [facility]) CM/SW Contact  Allayne Butcher, RN Phone Number: 04/08/2020, 3:48 PM  Clinical Narrative:    Requests for inpatient psych sent out to [facility] and [facility].    Expected Discharge Plan: Home/Self Care Barriers to Discharge: Continued Medical Work up (needs further PT)  Expected Discharge Plan and Services Expected Discharge Plan: Home/Self Care   Discharge Planning Services: CM Consult, Medication Assistance, Indigent Health Clinic   Living arrangements for the past 2 months: Single Family Home Expected Discharge Date: 04/06/20                                     Social Determinants of Health (SDOH) Interventions    Readmission Risk Interventions Readmission Risk Prevention Plan 03/29/2020  Transportation Screening Complete  HRI or Home Care Consult Complete  Social Work Consult for Recovery Care Planning/Counseling Complete  Palliative Care Screening Not Applicable  Medication Review Oceanographer) Referral to Pharmacy  Some recent data might be hidden

## 2020-04-08 NOTE — Progress Notes (Signed)
PROGRESS NOTE    Lance Ray  UDJ:[id number] DOB: 03-08-1971 DOA: 03/24/2020 PCP: Patient, No Pcp Per    Chief Complaint  Patient presents with  . Seizures    Brief Narrative: [name], who has not been on his Depakote for months and presented to the emergency room with acute onset of witnessedgeneralized tonic-clonicseizure at [employer]. He was loaded with IV lorazepam and keppra and admitted to PCU. PSYCHIATRY consulted for depression.  Meanwhile a CT head and MRI brain were neg for acute pathology. Hospital course was complicated by patient's psychosis and agitation, required precedex gtt for agitation, in and out of SDU. He is currently medically stable for discharge to [facility], but unable to go to [facility] as he is requiring walker and assistance to ambulate. He will probably need CIR prior to transfer to [facility]. TOC on board to assist with discharge.   Assessment & Plan:   Active Problems:   Recurrent seizures (HCC)   Seizure (HCC)   Acute metabolic encephalopathy:  Probably multifactorial, medication induced, alcohol withdrawal, seizures, psychosis , adjustment disorder.  Much improved. Patient appears to be back to baseline. CT and MRI unremarkable.  Resume cogentin, klonopin, Cymbalta, haldol and Zyprexa.     Seizures:  Sec to non compliance to meds.  no seizure activity in the last 24 hours Resume Lamictal and keppra.    Depression.  Resume cymbalta.    Hypertension:  Well-controlled blood pressure parameters    Hypokalemia; replaced.        DVT prophylaxis: (Lovenox) Code Status: full code.  Family Communication: none at bedside.  Disposition:   Status is: Inpatient  Remains inpatient appropriate because:Unsafe d/c plan   Dispo: The patient is from: Home              Anticipated d/c is to: CIR              Anticipated d/c date is: 1 day              Patient  currently is medically stable to d/c.       Consultants:   Psychiatry.   Procedures: none.   Antimicrobials: none.    Subjective: No new complaints at this time  Objective: Vitals:   04/07/20 1312 04/08/20 0024 04/08/20 0455 04/08/20 1226  BP: 114/79 109/73 117/73 121/77  Pulse: (!) 104 88 75 87  Resp: 18 18 18 17   Temp: 98.2 F (36.8 C) 98.2 F (36.8 C) 98.4 F (36.9 C) 97.7 F (36.5 C)  TempSrc: Oral     SpO2: 97% 96% 97% 99%  Weight:      Height:       No intake or output data in the 24 hours ending 04/08/20 1507 Filed Weights   03/24/20 2220  Weight: 81.6 kg    Examination:  General exam: Alert and comfortable, not in any kind of distress Respiratory system: Clear to auscultation bilaterally, no wheezing or rhonchi Cardiovascular system: S1-S2 heard, regular rate rhythm, no JVD, no pedal edema Gastrointestinal system: Abdomen is soft, nontender, nondistended, bowel sounds normal Central nervous system: Alert and oriented to person and place, grossly nonfocal Extremities: No cyanosis or clubbing Skin: No rashes seen Psychiatry: Flat affect    Data Reviewed: I have personally reviewed following labs and imaging studies  CBC: Recent Labs  Lab 04/02/20 0426  WBC 5.8  HGB 13.1  HCT 39.1  MCV 87.9  PLT 352  Basic Metabolic Panel: Recent Labs  Lab 04/02/20 0426 04/04/20 0808 04/06/20 0414  NA 140 141 138  K 3.6 3.2* 4.0  CL 104 104 105  CO2 25 26 27   GLUCOSE 74 92 91  BUN 13 15 19   CREATININE 0.97 0.94 1.00  CALCIUM 9.0 8.8* 9.2    GFR: Estimated Creatinine Clearance: 89.4 mL/min (by C-G formula based on SCr of 1 mg/dL).  Liver Function Tests: Recent Labs  Lab 04/04/20 0808  AST 17  ALT 13  ALKPHOS 78  BILITOT 0.7  PROT 6.8  ALBUMIN 4.3    CBG: No results for input(s): GLUCAP in the last 168 hours.   Recent Results (from the past 240 hour(s))  Culture, blood (Routine X 2) w Reflex to ID Panel     Status: None    Collection Time: 03/31/20 10:34 AM   Specimen: BLOOD  Result Value Ref Range Status   Specimen Description BLOOD BLOOD RIGHT ARM  Final   Special Requests   Final    BOTTLES DRAWN AEROBIC ONLY Blood Culture results may not be optimal due to an inadequate volume of blood received in culture bottles   Culture   Final    NO GROWTH 5 DAYS Performed at [facility], [address], [place], [address] [place]    Report Status 04/05/2020 FINAL  Final  Culture, blood (Routine X 2) w Reflex to ID Panel     Status: None   Collection Time: 03/31/20 10:41 AM   Specimen: BLOOD  Result Value Ref Range Status   Specimen Description BLOOD LEFT ANTECUBITAL  Final   Special Requests   Final    BOTTLES DRAWN AEROBIC AND ANAEROBIC Blood Culture adequate volume   Culture   Final    NO GROWTH 5 DAYS Performed at [facility], [address], [place], [address] [place]    Report Status 04/05/2020 FINAL  Final         Radiology Studies: No results found.      Scheduled Meds: . benztropine  1 mg Oral BID  . clonazePAM  0.25 mg Oral BID  . dexmedetomidine  1 mcg/kg Intravenous Once  . DULoxetine  30 mg Oral Daily  . enoxaparin (LOVENOX) injection  40 mg Subcutaneous Q24H  . haloperidol  0.25 mg Oral BID  . lamoTRIgine  150 mg Oral BID  . levETIRAcetam  1,000 mg Oral BID  . OLANZapine zydis  7.5 mg Oral QHS  . thiamine  100 mg Oral Daily   Continuous Infusions: . sodium chloride Stopped (04/03/20 0940)     LOS: 14 days       06/06/2020, MD [facility]   To contact the attending provider between 7A-7P or the covering provider during after hours 7P-7A, please log into the web site [email or web address] and access using universal  password for that web site. If you do not have the password, please call the hospital operator.  04/08/2020, 3:07 PM

## 2020-04-09 ENCOUNTER — Other Ambulatory Visit: Payer: Self-pay

## 2020-04-09 ENCOUNTER — Encounter: Payer: Self-pay | Admitting: Psychiatry

## 2020-04-09 ENCOUNTER — Inpatient Hospital Stay
Admission: AD | Admit: 2020-04-09 | Discharge: 2020-05-07 | DRG: 101 | Disposition: A | Discharge status: Other Acute Inpatient Hospital | Payer: No Typology Code available for payment source | Source: Intra-hospital | Attending: Family Medicine | Admitting: Family Medicine

## 2020-04-09 DIAGNOSIS — F0280 Dementia in other diseases classified elsewhere without behavioral disturbance: Secondary | ICD-10-CM | POA: Diagnosis present

## 2020-04-09 DIAGNOSIS — F8081 Childhood onset fluency disorder: Secondary | ICD-10-CM

## 2020-04-09 DIAGNOSIS — Z9119 Patient's noncompliance with other medical treatment and regimen: Secondary | ICD-10-CM | POA: Diagnosis not present

## 2020-04-09 DIAGNOSIS — F17210 Nicotine dependence, cigarettes, uncomplicated: Secondary | ICD-10-CM | POA: Diagnosis present

## 2020-04-09 DIAGNOSIS — R64 Cachexia: Secondary | ICD-10-CM | POA: Diagnosis present

## 2020-04-09 DIAGNOSIS — R569 Unspecified convulsions: Secondary | ICD-10-CM

## 2020-04-09 DIAGNOSIS — F329 Major depressive disorder, single episode, unspecified: Secondary | ICD-10-CM | POA: Diagnosis present

## 2020-04-09 DIAGNOSIS — D519 Vitamin B12 deficiency anemia, unspecified: Secondary | ICD-10-CM

## 2020-04-09 DIAGNOSIS — F419 Anxiety disorder, unspecified: Secondary | ICD-10-CM | POA: Diagnosis present

## 2020-04-09 DIAGNOSIS — G40909 Epilepsy, unspecified, not intractable, without status epilepticus: Secondary | ICD-10-CM | POA: Diagnosis not present

## 2020-04-09 DIAGNOSIS — F321 Major depressive disorder, single episode, moderate: Secondary | ICD-10-CM | POA: Diagnosis not present

## 2020-04-09 DIAGNOSIS — F0281 Dementia in other diseases classified elsewhere with behavioral disturbance: Secondary | ICD-10-CM

## 2020-04-09 DIAGNOSIS — I10 Essential (primary) hypertension: Secondary | ICD-10-CM | POA: Diagnosis present

## 2020-04-09 DIAGNOSIS — G4700 Insomnia, unspecified: Secondary | ICD-10-CM | POA: Diagnosis present

## 2020-04-09 DIAGNOSIS — Z6820 Body mass index (BMI) 20.0-20.9, adult: Secondary | ICD-10-CM

## 2020-04-09 DIAGNOSIS — R627 Adult failure to thrive: Secondary | ICD-10-CM | POA: Diagnosis present

## 2020-04-09 DIAGNOSIS — F068 Other specified mental disorders due to known physiological condition: Secondary | ICD-10-CM | POA: Diagnosis present

## 2020-04-09 DIAGNOSIS — G2571 Drug induced akathisia: Secondary | ICD-10-CM | POA: Diagnosis present

## 2020-04-09 DIAGNOSIS — F0390 Unspecified dementia without behavioral disturbance: Secondary | ICD-10-CM

## 2020-04-09 DIAGNOSIS — G3184 Mild cognitive impairment, so stated: Secondary | ICD-10-CM | POA: Diagnosis not present

## 2020-04-09 DIAGNOSIS — G40919 Epilepsy, unspecified, intractable, without status epilepticus: Secondary | ICD-10-CM | POA: Diagnosis present

## 2020-04-09 DIAGNOSIS — I959 Hypotension, unspecified: Secondary | ICD-10-CM | POA: Diagnosis not present

## 2020-04-09 DIAGNOSIS — Z79899 Other long term (current) drug therapy: Secondary | ICD-10-CM | POA: Diagnosis not present

## 2020-04-09 DIAGNOSIS — G40509 Epileptic seizures related to external causes, not intractable, without status epilepticus: Secondary | ICD-10-CM | POA: Diagnosis present

## 2020-04-09 DIAGNOSIS — F29 Unspecified psychosis not due to a substance or known physiological condition: Secondary | ICD-10-CM | POA: Diagnosis present

## 2020-04-09 DIAGNOSIS — F1027 Alcohol dependence with alcohol-induced persisting dementia: Secondary | ICD-10-CM | POA: Diagnosis not present

## 2020-04-09 DIAGNOSIS — F1010 Alcohol abuse, uncomplicated: Secondary | ICD-10-CM | POA: Diagnosis not present

## 2020-04-09 DIAGNOSIS — F32A Depression, unspecified: Secondary | ICD-10-CM

## 2020-04-09 MED ORDER — ACETAMINOPHEN 325 MG PO TABS
650.0000 mg | ORAL_TABLET | Freq: Four times a day (QID) | ORAL | Status: DC | PRN
  Administered 2020-04-15 – 2020-04-20 (×2): 650 mg via ORAL
  Filled 2020-04-09 (×2): qty 2

## 2020-04-09 MED ORDER — OLANZAPINE 10 MG PO TBDP: 10.0000 mg | ORAL_TABLET | Freq: Every day | ORAL | 0 refills | Status: DC

## 2020-04-09 MED ORDER — MAGNESIUM HYDROXIDE 400 MG/5ML PO SUSP: 30.0000 mL | Freq: Every day | ORAL | Status: DC | PRN

## 2020-04-09 MED ORDER — OLANZAPINE 10 MG IM SOLR
10.0000 mg | Freq: Four times a day (QID) | INTRAMUSCULAR | Status: DC | PRN
  Administered 2020-04-17 – 2020-05-04 (×4): 10 mg via INTRAMUSCULAR
  Filled 2020-04-09 (×4): qty 10

## 2020-04-09 MED ORDER — BENZTROPINE MESYLATE 1 MG PO TABS
1.0000 mg | ORAL_TABLET | Freq: Two times a day (BID) | ORAL | Status: DC
  Administered 2020-04-09 – 2020-04-22 (×26): 1 mg via ORAL
  Filled 2020-04-09 (×26): qty 1

## 2020-04-09 MED ORDER — THIAMINE HCL 100 MG PO TABS
100.0000 mg | ORAL_TABLET | Freq: Every day | ORAL | Status: DC
  Administered 2020-04-10 – 2020-05-07 (×28): 100 mg via ORAL
  Filled 2020-04-09 (×28): qty 1

## 2020-04-09 MED ORDER — CLONAZEPAM 0.25 MG PO TBDP
0.2500 mg | ORAL_TABLET | Freq: Two times a day (BID) | ORAL | Status: DC
  Administered 2020-04-09 – 2020-04-10 (×2): 0.25 mg via ORAL
  Filled 2020-04-09 (×2): qty 1

## 2020-04-09 MED ORDER — HALOPERIDOL 1 MG PO TABS
0.5000 mg | ORAL_TABLET | Freq: Two times a day (BID) | ORAL | Status: DC
  Administered 2020-04-09 – 2020-04-12 (×6): 0.5 mg via ORAL
  Filled 2020-04-09 (×6): qty 1

## 2020-04-09 MED ORDER — HALOPERIDOL LACTATE 5 MG/ML IJ SOLN
1.0000 mg | Freq: Four times a day (QID) | INTRAMUSCULAR | Status: DC | PRN
  Administered 2020-04-17: 1 mg via INTRAVENOUS
  Filled 2020-04-09: qty 1

## 2020-04-09 MED ORDER — ONDANSETRON HCL 4 MG/2ML IJ SOLN
4.0000 mg | Freq: Four times a day (QID) | INTRAMUSCULAR | Status: DC | PRN
  Filled 2020-04-09: qty 2

## 2020-04-09 MED ORDER — ONDANSETRON HCL 4 MG PO TABS: 4.0000 mg | ORAL_TABLET | Freq: Four times a day (QID) | ORAL | Status: DC | PRN

## 2020-04-09 MED ORDER — LEVETIRACETAM 500 MG PO TABS
1000.0000 mg | ORAL_TABLET | Freq: Two times a day (BID) | ORAL | Status: DC
  Administered 2020-04-09 – 2020-04-24 (×29): 1000 mg via ORAL
  Filled 2020-04-09 (×31): qty 2

## 2020-04-09 MED ORDER — LAMOTRIGINE 25 MG PO TABS
150.0000 mg | ORAL_TABLET | Freq: Two times a day (BID) | ORAL | Status: DC
  Administered 2020-04-09 – 2020-04-30 (×42): 150 mg via ORAL
  Filled 2020-04-09 (×44): qty 1

## 2020-04-09 MED ORDER — ALUM & MAG HYDROXIDE-SIMETH 200-200-20 MG/5ML PO SUSP: 30.0000 mL | ORAL | Status: DC | PRN

## 2020-04-09 MED ORDER — DULOXETINE HCL 30 MG PO CPEP
30.0000 mg | ORAL_CAPSULE | Freq: Every day | ORAL | Status: DC
  Administered 2020-04-10: 30 mg via ORAL
  Filled 2020-04-09: qty 1

## 2020-04-09 MED ORDER — HALOPERIDOL 1 MG PO TABS
2.0000 mg | ORAL_TABLET | Freq: Four times a day (QID) | ORAL | Status: DC | PRN
  Administered 2020-04-12 – 2020-05-06 (×6): 2 mg via ORAL
  Filled 2020-04-09 (×7): qty 2

## 2020-04-09 MED ORDER — HYDRALAZINE HCL 50 MG PO TABS
50.0000 mg | ORAL_TABLET | Freq: Four times a day (QID) | ORAL | Status: DC | PRN
  Filled 2020-04-09: qty 1

## 2020-04-09 MED ORDER — OLANZAPINE 5 MG PO TBDP
10.0000 mg | ORAL_TABLET | Freq: Every day | ORAL | Status: DC
  Administered 2020-04-09: 10 mg via ORAL
  Filled 2020-04-09: qty 2

## 2020-04-09 NOTE — Discharge Summary (Addendum)
Physician Discharge Summary  Lance Ray QQI:[id number] DOB: 20-Feb-1971 DOA: 03/24/2020  PCP: Patient, No Pcp Per  Admit date: 03/24/2020 Discharge date: 04/09/2020  Admitted From: Home.  Disposition:[facility]  Recommendations for Outpatient Follow-up:  1. Follow up with PCP in 1-2 weeks 2. Please obtain BMP/CBC in one week   Discharge Condition:stable.  CODE STATUS:FULL CODE.  Diet recommendation: Heart Healthy  Brief/Interim Summary: [name], who has not been on his Depakote for months and presented to the emergency room with acute onset of witnessedgeneralized tonic-clonicseizure at [employer]. He was loaded with IV lorazepam and keppra and admitted to PCU. PSYCHIATRY consulted for depression.  Meanwhile a CT head and MRI brain were neg for acute pathology. Hospital course was complicated by patient's psychosis and agitation, required precedex gtt for agitation, in and out of SDU. He is currently medically stable for discharge to [facility].  Discharge Diagnoses:  Active Problems:   Recurrent seizures (HCC)   Seizure (HCC)  Acute metabolic encephalopathy:  Probably multifactorial, medication induced, alcohol withdrawal, seizures, psychosis , adjustment disorder.  Much improved. Patient appears to be back to baseline. CT and MRI unremarkable.  Resume cogentin, klonopin, Cymbalta, haldol and Zyprexa.     Seizures:  Sec to non compliance to meds.  no seizure activity in the last 24 hours Resume Lamictal and keppra.    Depression.  Resume cymbalta.    Hypertension:  Well-controlled blood pressure parameters    Hypokalemia; replaced.     Discharge Instructions  Discharge Instructions    Diet - low sodium heart healthy   Complete by: As directed    Diet - low sodium heart healthy   Complete by: As directed    Increase activity slowly   Complete by: As directed       Allergies as of 04/09/2020   No Known Allergies     Medication List    STOP taking these medications   divalproex 500 MG 24 hr tablet Commonly known as: DEPAKOTE ER     TAKE these medications   benztropine 1 MG tablet Commonly known as: COGENTIN Take 1 tablet (1 mg total) by mouth 2 (two) times daily.   clonazePAM 0.25 MG disintegrating tablet Commonly known as: KLONOPIN Take 1 tablet (0.25 mg total) by mouth 2 (two) times daily.   DULoxetine 30 MG capsule Commonly known as: CYMBALTA Take 1 capsule (30 mg total) by mouth daily.   haloperidol 0.5 MG tablet Commonly known as: HALDOL Take 0.5 tablets (0.25 mg total) by mouth 2 (two) times daily.   lamoTRIgine 150 MG tablet Commonly known as: LAMICTAL Take 1 tablet (150 mg total) by mouth 2 (two) times daily. What changed:   medication strength  how much to take   levETIRAcetam 1000 MG tablet Commonly known as: KEPPRA Take 1 tablet (1,000 mg total) by mouth 2 (two) times daily.   OLANZapine zydis 10 MG disintegrating tablet Commonly known as: ZYPREXA Take 1 tablet (10 mg total) by mouth at bedtime.   thiamine 100 MG tablet Take 1 tablet (100 mg total) by mouth daily.       No Known Allergies  Consultations: Psychiatry.   Procedures/Studies: CT Head Wo Contrast  Result Date: 03/24/2020 CLINICAL DATA:  Head trauma, intracranial arterial injury suspected Head trauma, intracranial venous injury suspected Seizure at [employer]. EXAM: CT HEAD WITHOUT CONTRAST TECHNIQUE: Contiguous axial images were obtained from the base of the skull through the vertex without intravenous contrast.  COMPARISON:  Head CT 12/29/2019 FINDINGS: Brain: Mild atrophy for age, stable. No intracranial hemorrhage, mass effect, or midline shift. No hydrocephalus. The basilar cisterns are patent. Remote lacunar infarcts versus dilated perivascular space in the basal ganglia, unchanged. No evidence of territorial infarct or acute ischemia.  No extra-axial or intracranial fluid collection. Vascular: No hyperdense vessel. Skull: No fracture or focal lesion. Sinuses/Orbits: Paranasal sinuses and mastoid air cells are clear. The visualized orbits are unremarkable. Other: None. IMPRESSION: No acute intracranial abnormality. No skull fracture. Electronically Signed   By: Narda Rutherford M.D.   On: 03/24/2020 21:23   MR BRAIN WO CONTRAST  Result Date: 03/28/2020 CLINICAL DATA:  Initial evaluation for acute seizure, history of known seizure disorder. EXAM: MRI HEAD WITHOUT CONTRAST TECHNIQUE: Multiplanar, multiecho pulse sequences of the brain and surrounding structures were obtained without intravenous contrast. COMPARISON:  Prior CT from 03/24/2020. FINDINGS: Brain: Examination degraded by motion artifact. Cerebral volume within normal limits for patient age. No focal parenchymal signal abnormality or significant cerebral white matter disease. No abnormal foci of restricted diffusion to suggest acute or subacute ischemia. Gray-white matter differentiation well maintained. No encephalomalacia to suggest chronic infarction. No foci of susceptibility artifact to suggest acute or chronic intracranial hemorrhage. No mass lesion, midline shift or mass effect. No hydrocephalus. No extra-axial fluid collection. Major dural sinuses are grossly patent. Pituitary gland and suprasellar region are normal. Midline structures intact and normal. No intrinsic temporal lobe abnormality. Vascular: Major intracranial vascular flow voids well maintained and normal in appearance. Skull and upper cervical spine: Craniocervical junction normal. Visualized upper cervical spine within normal limits. Bone marrow signal intensity normal. No scalp soft tissue abnormality. Sinuses/Orbits: Globes and orbital soft tissues within normal limits. Scattered mucosal thickening noted within the ethmoidal air cells. Paranasal sinuses are otherwise clear. No mastoid effusion. Inner ear  structures grossly normal. Other: None. IMPRESSION: Normal brain MRI.  No acute intracranial abnormality identified. Electronically Signed   By: Rise Mu M.D.   On: 03/28/2020 01:27       Subjective: No new complaints.   Discharge Exam: Vitals:   04/08/20 2000 04/09/20 0935  BP: 119/78 (!) 118/97  Pulse: 84 78  Resp: 17 18  Temp: 98.1 F (36.7 C) 97.6 F (36.4 C)  SpO2: 96% 99%   Vitals:   04/08/20 1226 04/08/20 1643 04/08/20 2000 04/09/20 0935  BP: 121/77 114/83 119/78 (!) 118/97  Pulse: 87 91 84 78  Resp: 17 16 17 18   Temp: 97.7 F (36.5 C) 97.8 F (36.6 C) 98.1 F (36.7 C) 97.6 F (36.4 C)  TempSrc:  Oral  Oral  SpO2: 99% 98% 96% 99%  Weight:      Height:        General: comfortable. No distress noted.  Cardiovascular: RRR, S1/S2 +, no rubs, no gallops Respiratory: CTA bilaterally, no wheezing, no rhonchi Abdominal: Soft, NT, ND, bowel sounds + Extremities: no edema, no cyanosis    The results of significant diagnostics from this hospitalization (including imaging, microbiology, ancillary and laboratory) are listed below for reference.     Microbiology: Recent Results (from the past 240 hour(s))  Culture, blood (Routine X 2) w Reflex to ID Panel     Status: None   Collection Time: 03/31/20 10:34 AM   Specimen: BLOOD  Result Value Ref Range Status   Specimen Description BLOOD BLOOD RIGHT ARM  Final   Special Requests   Final    BOTTLES DRAWN AEROBIC ONLY Blood Culture results may not  be optimal due to an inadequate volume of blood received in culture bottles   Culture   Final    NO GROWTH 5 DAYS Performed at [facility], [address] [place], [place] [address]    Report Status 04/05/2020 FINAL  Final  Culture, blood (Routine X 2) w Reflex to ID Panel     Status: None   Collection Time: 03/31/20 10:41 AM   Specimen: BLOOD  Result Value Ref Range Status   Specimen Description BLOOD LEFT ANTECUBITAL  Final   Special Requests    Final    BOTTLES DRAWN AEROBIC AND ANAEROBIC Blood Culture adequate volume   Culture   Final    NO GROWTH 5 DAYS Performed at [facility], [address], [place], [place] [address]    Report Status 04/05/2020 FINAL  Final     Labs: BNP (last 3 results) No results for input(s): BNP in the last 8760 hours. Basic Metabolic Panel: Recent Labs  Lab 04/04/20 0808 04/06/20 0414  NA 141 138  K 3.2* 4.0  CL 104 105  CO2 26 27  GLUCOSE 92 91  BUN 15 19  CREATININE 0.94 1.00  CALCIUM 8.8* 9.2   Liver Function Tests: Recent Labs  Lab 04/04/20 0808  AST 17  ALT 13  ALKPHOS 78  BILITOT 0.7  PROT 6.8  ALBUMIN 4.3   No results for input(s): LIPASE, AMYLASE in the last 168 hours. Recent Labs  Lab 04/03/20 1314  AMMONIA 28   CBC: No results for input(s): WBC, NEUTROABS, HGB, HCT, MCV, PLT in the last 168 hours. Cardiac Enzymes: No results for input(s): CKTOTAL, CKMB, CKMBINDEX, TROPONINI in the last 168 hours. BNP: Invalid input(s): POCBNP CBG: No results for input(s): GLUCAP in the last 168 hours. D-Dimer No results for input(s): DDIMER in the last 72 hours. Hgb A1c No results for input(s): HGBA1C in the last 72 hours. Lipid Profile No results for input(s): CHOL, HDL, LDLCALC, TRIG, CHOLHDL, LDLDIRECT in the last 72 hours. Thyroid function studies No results for input(s): TSH, T4TOTAL, T3FREE, THYROIDAB in the last 72 hours.  Invalid input(s): FREET3 Anemia work up No results for input(s): VITAMINB12, FOLATE, FERRITIN, TIBC, IRON, RETICCTPCT in the last 72 hours. Urinalysis    Component Value Date/Time   COLORURINE YELLOW (A) 05/27/2016 0200   APPEARANCEUR CLEAR (A) 05/27/2016 0200   LABSPEC 1.020 05/27/2016 0200   PHURINE 5.0 05/27/2016 0200   GLUCOSEU NEGATIVE 05/27/2016 0200   HGBUR NEGATIVE 05/27/2016 0200   BILIRUBINUR NEGATIVE 05/27/2016 0200   KETONESUR TRACE (A) 05/27/2016 0200   PROTEINUR 100 (A) 05/27/2016 0200   NITRITE NEGATIVE  05/27/2016 0200   LEUKOCYTESUR NEGATIVE 05/27/2016 0200   Sepsis Labs Invalid input(s): PROCALCITONIN,  WBC,  LACTICIDVEN Microbiology Recent Results (from the past 240 hour(s))  Culture, blood (Routine X 2) w Reflex to ID Panel     Status: None   Collection Time: 03/31/20 10:34 AM   Specimen: BLOOD  Result Value Ref Range Status   Specimen Description BLOOD BLOOD RIGHT ARM  Final   Special Requests   Final    BOTTLES DRAWN AEROBIC ONLY Blood Culture results may not be optimal due to an inadequate volume of blood received in culture bottles   Culture   Final    NO GROWTH 5 DAYS Performed at [facility], [address], [place], [place] [address]    Report Status 04/05/2020 FINAL  Final  Culture, blood (Routine X 2) w Reflex to ID  Panel     Status: None   Collection Time: 03/31/20 10:41 AM   Specimen: BLOOD  Result Value Ref Range Status   Specimen Description BLOOD LEFT ANTECUBITAL  Final   Special Requests   Final    BOTTLES DRAWN AEROBIC AND ANAEROBIC Blood Culture adequate volume   Culture   Final    NO GROWTH 5 DAYS Performed at [facility], [address] [place], [place] [id number]    Report Status 04/05/2020 FINAL  Final     Time coordinating discharge: 32 minutes.   SIGNED:   Kathlen ModyVijaya Maykel Reitter, MD  [facility] 04/09/2020, 1:43 PM

## 2020-04-09 NOTE — Progress Notes (Signed)
Patient admitted from medical unit for depression after he had a seizure 2 weeks ago.Patient is pleasant and cooperative on approach.Denies SI,HI and AVH at this time.Patient is not able to make a logical conversation.Patient ambulates with walker with max. Assistance. Patient informed of fall risk status, fall risk assessed "High" at this time.Patient is with 1:1 Recruitment consultant. Patient oriented to unit/staff/room. Patient denies any questions/concerns at this time. Patient safe on unit with Q15 minute checks for safety. Skin assessment and body search done,no contraband found.
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
## 2020-04-09 NOTE — Tx Team (Signed)
Initial Treatment Plan 04/09/2020 5:48 PM Lance Ray VNR:[id number]    PATIENT STRESSORS: Financial difficulties Health problems Medication change or noncompliance Substance abuse Traumatic event   PATIENT STRENGTHS: Motivation for treatment/growth Supportive family/friends   PATIENT IDENTIFIED PROBLEMS: Dementia  Need assistance with ADLs                   DISCHARGE CRITERIA:  Adequate post-discharge living arrangements Improved stabilization in mood, thinking, and/or behavior Verbal commitment to aftercare and medication compliance  PRELIMINARY DISCHARGE PLAN: Attend aftercare/continuing care group Return to previous living arrangement  PATIENT/FAMILY INVOLVEMENT: This treatment plan has been presented to and reviewed with the patient, Lance Ray, and/or family member,.  The patient and family have been given the opportunity to ask questions and make suggestions.  [name], RN 04/09/2020, 5:48 PM

## 2020-04-09 NOTE — TOC Transition Note (Signed)
Transition of Care [facility]) - CM/SW Discharge Note   Patient Details  Name: Lance Ray MRN: [id number] Date of Birth: 1971/05/23  [facility]) CM/SW Contact:  Allayne Butcher, RN Phone Number: 04/09/2020, 1:56 PM   Clinical Narrative:    Patient is discharging to the behavioral health unit.    Final next level of care: Psychiatric Hospital Barriers to Discharge: Barriers Resolved   Patient Goals and CMS Choice Patient states their goals for this hospitalization and ongoing recovery are:: To get out of here      Discharge Placement                       Discharge Plan and Services   Discharge Planning Services: CM Consult, Medication Assistance, Indigent Health Clinic                                 Social Determinants of Health (SDOH) Interventions     Readmission Risk Interventions Readmission Risk Prevention Plan 03/29/2020  Transportation Screening Complete  HRI or Home Care Consult Complete  Social Work Consult for Recovery Care Planning/Counseling Complete  Palliative Care Screening Not Applicable  Medication Review [employer]) Referral to Pharmacy  Some recent data might be hidden

## 2020-04-09 NOTE — [facility] Suicide Risk Assessment (Signed)
[facility] Admission Suicide Risk Assessment   Nursing information obtained from:    Demographic factors:    Current Mental Status:    Loss Factors:    Historical Factors:    Risk Reduction Factors:     Total Time spent with patient: 1 hour Principal Problem: Psychosis (HCC) Diagnosis:  Principal Problem:   Psychosis (HCC) Active Problems:   Recurrent seizures (HCC)   Failure to thrive in adult  Subjective Data: Patient seen.  Chart reviewed.  49 year old man with a long history of seizure disorder with very poor control and multiple seizures over the last few years came to the ER as usual this time after a witnessed grand mal seizure.  Has been stabilized on medicine.  Patient's chief complaint currently is weakness and trouble walking.  Denies depression.  Denies suicidal or homicidal ideation.  Denies psychotic symptoms.  No past history of suicidality or mood disorder  Continued Clinical Symptoms:  Alcohol Use Disorder Identification Test Final Score (AUDIT): 5 The "Alcohol Use Disorders Identification Test", Guidelines for Use in Primary Care, Second Edition.  World Science writer [facility]). Score between 0-7:  no or low risk or alcohol related problems. Score between 8-15:  moderate risk of alcohol related problems. Score between 16-19:  high risk of alcohol related problems. Score 20 or above:  warrants further diagnostic evaluation for alcohol dependence and treatment.   CLINICAL FACTORS:   Epilepsy Medical Diagnoses and Treatments/Surgeries   Musculoskeletal: Strength & Muscle Tone: decreased and atrophy Gait & Station: unsteady Patient leans: Front  Psychiatric Specialty Exam: Physical Exam Vitals and nursing note reviewed.  Constitutional:      Appearance: He is well-developed. He is ill-appearing.  HENT:     Head: Normocephalic and atraumatic.  Eyes:     Conjunctiva/sclera: Conjunctivae normal.     Pupils: Pupils are equal, round, and reactive to light.   Cardiovascular:     Heart sounds: Normal heart sounds.  Pulmonary:     Effort: Pulmonary effort is normal.  Abdominal:     Palpations: Abdomen is soft.  Musculoskeletal:        General: Normal range of motion.     Cervical back: Normal range of motion.  Skin:    General: Skin is warm and dry.  Neurological:     Motor: Weakness present.     Gait: Gait abnormal.  Psychiatric:        Attention and Perception: He is inattentive.        Mood and Affect: Mood normal.        Speech: Speech is slurred.        Behavior: Behavior is cooperative.        Thought Content: Thought content is not paranoid or delusional. Thought content does not include homicidal or suicidal ideation.        Cognition and Memory: Cognition is impaired. Memory is impaired. He exhibits impaired recent memory and impaired remote memory.        Judgment: Judgment is impulsive.     Review of Systems  Constitutional: Negative.   HENT: Negative.   Eyes: Negative.   Respiratory: Negative.   Cardiovascular: Negative.   Gastrointestinal: Negative.   Musculoskeletal: Negative.   Skin: Negative.   Neurological: Negative.   Psychiatric/Behavioral: Positive for confusion. Negative for agitation, behavioral problems, decreased concentration, dysphoric mood, hallucinations, self-injury, sleep disturbance and suicidal ideas. The patient is not nervous/anxious and is not hyperactive.     Blood pressure 119/89, pulse 96, temperature 97.9 F (  36.6 C), temperature source Oral, resp. rate 18, height 5\' 9"  (1.753 m), weight 62.6 kg, SpO2 100 %.Body mass index is 20.38 kg/m.  General Appearance: Fairly Groomed  Eye Contact:  Fair  Speech:  Garbled, Slow and Slurred  Volume:  Decreased  Mood:  Euthymic  Affect:  Constricted  Thought Process:  Disorganized  Orientation:  Full (Time, Place, and Person)  Thought Content:  Rumination and Tangential  Suicidal Thoughts:  No  Homicidal Thoughts:  No  Memory:  Immediate;    Fair Recent;   Poor Remote;   Poor  Judgement:  Impaired  Insight:  Shallow  Psychomotor Activity:  Decreased  Concentration:  Concentration: Poor  Recall:  of Knowledge:  Fair  Language:  Fair  Akathisia:  No  Handed:  Right  AIMS (if indicated):     Assets:  Desire for Improvement  ADL's:  Impaired  Cognition:  Impaired,  Mild  Sleep:         COGNITIVE FEATURES THAT CONTRIBUTE TO RISK:  Loss of executive function    SUICIDE RISK:   Minimal: No identifiable suicidal ideation.  Patients presenting with no risk factors but with morbid ruminations; may be classified as minimal risk based on the severity of the depressive symptoms  PLAN OF CARE: Patient is on one-to-one because of his unsteadiness.  Medicines as ordered upstairs are being continued for now pending further evaluation by psychiatry.  Ongoing assessment of dangerousness and appropriate treatment before arranging for safe discharge  I certify that inpatient services furnished can reasonably be expected to improve the patient's condition.   [employer], MD 04/09/2020, 3:58 PM

## 2020-04-10 MED ORDER — DULOXETINE HCL 20 MG PO CPEP
40.0000 mg | ORAL_CAPSULE | Freq: Every day | ORAL | Status: DC
  Administered 2020-04-11 – 2020-04-20 (×10): 40 mg via ORAL
  Filled 2020-04-10 (×10): qty 2

## 2020-04-10 MED ORDER — OLANZAPINE 5 MG PO TBDP
12.5000 mg | ORAL_TABLET | Freq: Every day | ORAL | Status: DC
  Administered 2020-04-10 – 2020-04-21 (×11): 12.5 mg via ORAL
  Filled 2020-04-10 (×11): qty 3

## 2020-04-10 MED ORDER — CLONAZEPAM 0.25 MG PO TBDP
0.5000 mg | ORAL_TABLET | Freq: Two times a day (BID) | ORAL | Status: DC
  Administered 2020-04-10 – 2020-04-22 (×24): 0.5 mg via ORAL
  Filled 2020-04-10 (×24): qty 2

## 2020-04-10 NOTE — [facility] Group Notes (Signed)
[facility] Group Notes: (Clinical Social Work)   04/10/2020      Type of Therapy:  Group Therapy   Participation Level:  Did Not Attend - Patient is a high fall risk and was having difficulties getting around.    Susa Simmonds, LCSWA 04/10/2020  2:34 PM

## 2020-04-10 NOTE — Progress Notes (Addendum)
Pt is confused, alert and oriented x2, to person, and place, but not to situation. Pt has moments where pt is forgetful and will ask, "Where am I ?" Pt has been monitored on a 1:1 with sitter for safety. Pt is calm, cooperative, pleasant, unable to follow directions. Pt saw PT today. Per Physical Therapy pt needs assist to walk and transfer. Pt also is not able to follow directions well. Pt was provided meals, PO fluids, and a urinal to use. Pt is medication compliant. Pt denies suicidal and homicidal ideation, denies hallucinations, however squat in his room, and began picking at the side of the bed and sat on the floor picking at the floor. Pt appears to be responding to internal stimuli. Pt at one point gathered all his things and said he was leaving after talking to his sister, however pt had not talked to his sister or used the phone all day. Pt requires reality orientation frequently. Will continue to monitor pt per Q15 minute face checks, 1:1 with sitter per orders, will continue to monitor for safety and progress.

## 2020-04-10 NOTE — [facility] Suicide Risk Assessment (Signed)
[facility] INPATIENT:  Family/Significant Other Suicide Prevention Education  Suicide Prevention Education:  Contact Attempts: Patient stated he was unable to sign release of information to go over SPE with a family member due to not having his glasses. (name of family member/significant other) has been identified by the patient as the family member/significant other with whom the patient will be residing, and identified as the person(s) who will aid the patient in the event of a mental health crisis.  With written consent from the patient, two attempts were made to provide suicide prevention education, prior to and/or following the patient's discharge.  We were unsuccessful in providing suicide prevention education.  A suicide education pamphlet was given to the patient to share with family/significant other.  Date and time of first attempt: 04/10/20/11:00 AM Date and time of second attempt:  Susa Simmonds 04/10/2020, 11:59 AM

## 2020-04-10 NOTE — Evaluation (Signed)
Physical Therapy Evaluation Patient Details Name: Lance Ray MRN: [id number] DOB: Oct 26, 1970 Today's Date: 04/10/2020   History of Present Illness  Lance Ray was admitted on behavior health unit on 7/16 after being on medical unit for seizures x 2 weeks. Pt with recent diagnosis of dementia. Pt seen by PT on medical unit, now evaluated on BH. PMH includes epilepsy and depression.  Clinical Impression  Pt lying in bed upon arrival to room. Pt requires assistance for bed mobility due to decreased safety awareness and impulsivity. Max verbal cues required and given with poor carryover. Pt noted to impulsively stand at the side of the bed with BLE hinged against bed frame to come into standing/provide stability as pt with posterior lean. Pt performed edge of bed sitting balance activity for reorientation of midline and keeping COG within BOS. Noted decreased reaction time. Pt performed sit <> stand x 5 in 31.64 seconds which is below the values for those of the same age group indicating a high risk of falls. Pt ambulated with and without RW, both requiring min+2 A for safety and balance. Gait quality slightly improved with RW however pt demonstrated strong ataxic movements, poor balance, and decreased righting reactions. Pt pleasantly confused throughout session. Pt exhibits good muscle strength however lacks to proper coordination to control his movements. Sitting and standing balance are both poor and require assistance for safety and max verbal cues for sequencing of all tasks. Limiting factors include impulsiveness and decreased cognition. At this point, recommend skilled PT to address aforementioned deficits during acute stay and STR at discharge to maximize functional mobility and return to PLOF.  Due to ataxic movements, poor motor control, decreased safety awareness, and impulsivity, recommend hands-on assistance at all times as pt is a HIGH falls risk.    Follow Up Recommendations  SNF;Supervision/Assistance - 24 hour    Equipment Recommendations  Rolling walker with 5" wheels    Recommendations for Other Services       Precautions / Restrictions Precautions Precautions: Fall Restrictions Weight Bearing Restrictions: No      Mobility  Bed Mobility Overal bed mobility: Needs Assistance Bed Mobility: Supine to Sit;Sit to Supine     Supine to sit: Min guard Sit to supine: Supervision   General bed mobility comments: Pt able to initiate movements however requires max verbal cues for sequencing and safety. CGA provided to come into full upright sitting.  Transfers Overall transfer level: Needs assistance Equipment used: None;2 person hand held assist;Rolling walker (2 wheeled) Transfers: Sit to/from Stand Sit to Stand: Min assist;+2 safety/equipment         General transfer comment: First sit to stand attempt without AD and HHA min+2 A for pt impulsivity and steadyinbg once upright. Pt with posterior lean and requires verbal cues with poor carryover on correction. Second sit to stand attempt with RW and min+2 A for steadying. Increased verbal cues for RW and hand placement.  Ambulation/Gait Ambulation/Gait assistance: Min guard;+2 safety/equipment Gait Distance (Feet): 40 Feet Assistive device: None;2 person hand held assist;Rolling walker (2 wheeled) Gait Pattern/deviations: Ataxic;Scissoring;Narrow base of support;Staggering left;Staggering right Gait velocity: decreased   General Gait Details: 20 feet ambulated with handheld min+2 A and 20 feet with RW and min+2 A. Pt with difficulty placing feet with LE advancement, noted to stagger to both L and R and requires increased verbal cues for sequencing.   Stairs            Wheelchair Mobility    Modified Rankin (Stroke  Patients Only)       Balance Overall balance assessment: Needs assistance Sitting-balance support: Feet unsupported;Single extremity supported;Feet supported Sitting  balance-Leahy Scale: Poor Sitting balance - Comments: pt noted difficulty with placing feet on floor and required max A to do so; pt often assumed hinged position with LUE on bed, posterior lean, and 1-2 feet unsupported; SBA-min A for balance Postural control: Posterior lean Standing balance support: Bilateral upper extremity supported Standing balance-Leahy Scale: Poor Standing balance comment: min+2 A with BUE on RW or handheld assist for standing balance; pt unable to maintain COG within BOS often leaning posteriorly                             Pertinent Vitals/Pain Pain Assessment: Faces Faces Pain Scale: Hurts a little bit Pain Location: L shoulder Pain Descriptors / Indicators: Discomfort Pain Intervention(s): Monitored during session    Home Living Family/patient expects to be discharged to:: Unsure Living Arrangements: Alone Available Help at Discharge: Family;Friend(s);Available PRN/intermittently Type of Home: House Home Access: Stairs to enter   [employer] of Steps: 1 Home Layout: One level   Additional Comments: PLOF and home setup information primarily gathered from previous documentation.     Prior Function Level of Independence: Independent         Comments: According to previous documentation, pt reporting independence at prior level, does not drive, and walks to local store from home.     Hand Dominance   Dominant Hand: Right    Extremity/Trunk Assessment   Upper Extremity Assessment Upper Extremity Assessment: Overall WFL for tasks assessed (5/5 grossly in BUE)    Lower Extremity Assessment Lower Extremity Assessment: Overall WFL for tasks assessed (grossly 4+/5 BLE)       Communication   Communication: Other (comment) (pt with garbled speech and often difficult to understand)  Cognition Arousal/Alertness: Awake/alert Behavior During Therapy: Impulsive;Anxious;Restless Overall Cognitive Status: Difficult to assess Area  of Impairment: Following commands;Safety/judgement;Awareness;Problem solving                   Current Attention Level: Alternating;Focused Memory: Decreased recall of precautions;Decreased short-term memory Following Commands: Follows one step commands inconsistently;Follows one step commands with increased time;Follows multi-step commands inconsistently;Follows multi-step commands with increased time Safety/Judgement: Decreased awareness of safety;Decreased awareness of deficits   Problem Solving: Slow processing;Difficulty sequencing;Requires verbal cues;Requires tactile cues General Comments: pt aware of name, birthday, month and year      General Comments      Exercises Other Exercises Other Exercises: active AP and glute bridges x 10; tactile cues on bilat knees for proper positioning during bridging activity Other Exercises: pt performed EOB sitting balance with perturbations and SBA for safety; pt performed static standing balance with BLE on bed and sit <> stand x 5 in 31.64 seconds   Assessment/Plan    PT Assessment Patient needs continued PT services  PT Problem List Decreased strength;Decreased activity tolerance;Decreased balance;Decreased mobility;Decreased coordination;Decreased cognition;Decreased knowledge of use of DME;Decreased safety awareness       PT Treatment Interventions DME instruction;Gait training;Stair training;Functional mobility training;Therapeutic activities;Therapeutic exercise;Balance training;Neuromuscular re-education;Cognitive remediation;Patient/family education    PT Goals (Current goals can be found in the Care Plan section)  Acute Rehab PT Goals Patient Stated Goal: none stated PT Goal Formulation: Patient unable to participate in goal setting Time For Goal Achievement: 04/24/20 Potential to Achieve Goals: Fair    Frequency Min 2X/week   Barriers to discharge Decreased caregiver  support      Co-evaluation                AM-PAC PT "6 Clicks" Mobility  Outcome Measure Help needed turning from your back to your side while in a flat bed without using bedrails?: A Little Help needed moving from lying on your back to sitting on the side of a flat bed without using bedrails?: A Little Help needed moving to and from a bed to a chair (including a wheelchair)?: A Lot Help needed standing up from a chair using your arms (e.g., wheelchair or bedside chair)?: A Lot Help needed to walk in hospital room?: A Lot Help needed climbing 3-5 steps with a railing? : A Lot 6 Click Score: 14    End of Session Equipment Utilized During Treatment: Gait belt Activity Tolerance: Patient tolerated treatment well Patient left: in bed;Other (comment) (pt in direct view of staff at nurses station) Nurse Communication: Mobility status PT Visit Diagnosis: Unsteadiness on feet (R26.81);Other abnormalities of gait and mobility (R26.89);Muscle weakness (generalized) (M62.81)    Time: 1310-1350 PT Time Calculation (min) (ACUTE ONLY): 40 min   Charges:   PT Evaluation $PT Eval Moderate Complexity: 1 Mod PT Treatments $Gait Training: 8-22 mins $Therapeutic Exercise: 8-22 mins        Frederich Chick, SPT  Carlo Lorson 04/10/2020, 3:09 PM

## 2020-04-10 NOTE — Progress Notes (Signed)
[facility] MD Progress Note  04/10/2020 1:11 PM Lance Ray  MRN:  [id number] Subjective:   I do not know now  Principal Problem: Dementia ([place]) Diagnosis: Principal Problem:   Dementia ([place]) Active Problems:   Recurrent seizures ([place])   Psychosis ([place])   Failure to thrive in adult  Total Time spent with patient:  80    Narrative  Patient known to me on consult  Issues of above now on psych unit after very long problems with seizures and severe psychosis and mania  Now he is impaired in judgement insight reliability and does not meet capacity for consent  Will meet with sisters who I met before to discuss NH placement at this point        Past Psychiatric History:  Long history of ETOH dependence, Seizures Bipolar traits, major depression social problems   Past Medical History:  Past Medical History:  Diagnosis Date  . Seizures ([place])     Past Surgical History:  Procedure Laterality Date  . SKIN GRAFT     Family History:  Family History  Problem Relation Age of Onset  . Seizures Mother    Family Psychiatric  History:   Already discussed    Social History:  Social History   Substance and Sexual Activity  Alcohol Use Yes  . Alcohol/week: 2.0 standard drinks  . Types: 2 Shots of liquor per week   Comment: "rarely"      Social History   Substance and Sexual Activity  Drug Use No    Social History   Socioeconomic History  . Marital status: Single    Spouse name: Not on file  . Number of children: Not on file  . Years of education: Not on file  . Highest education level: Not on file  Occupational History  . Not on file  Tobacco Use  . Smoking status: Current Some Day Smoker  . Smokeless tobacco: Current User  Vaping Use  . Vaping Use: Never used  Substance and Sexual Activity  . Alcohol use: Yes    Alcohol/week: 2.0 standard drinks    Types: 2 Shots of liquor per week    Comment: "rarely"   . Drug use: No  . Sexual activity: Yes  Other  Topics Concern  . Not on file  Social History Narrative  . Not on file   Social Determinants of Health   Financial Resource Strain:   . Difficulty of Paying Living Expenses:   Food Insecurity:   . Worried About Charity fundraiser in the Last Year:   . Arboriculturist in the Last Year:   Transportation Needs:   . Film/video editor (Medical):   [name] Lack of Transportation (Non-Medical):   Physical Activity:   . Days of Exercise per Week:   . Minutes of Exercise per Session:   Stress:   . Feeling of Stress :   Social Connections:   . Frequency of Communication with Friends and Family:   . Frequency of Social Gatherings with Friends and Family:   . Attends Religious Services:   . Active Member of Clubs or Organizations:   . Attends Archivist Meetings:   [name] Marital Status:    Additional Social History:       Has two sisters who check in with him and help him with his house and finances                   Sleep: fair  Appetite:  Fair   Current Medications: Current Facility-Administered Medications  Medication Dose Route Frequency Provider Last Rate Last Admin  . acetaminophen (TYLENOL) tablet 650 mg  650 mg Oral Q6H PRN [name], [name], MD      . alum & mag hydroxide-simeth (MAALOX/MYLANTA) 200-200-20 MG/5ML suspension 30 mL  30 mL Oral Q4H PRN [name], [name], MD      . benztropine (COGENTIN) tablet 1 mg  1 mg Oral BID [name], Madie Reno, MD   1 mg at 04/10/20 1015  . clonazePAM (KLONOPIN) disintegrating tablet 0.5 mg  0.5 mg Oral BID Eulas Post, MD      . Derrill Memo ON 04/11/2020] DULoxetine (CYMBALTA) DR capsule 40 mg  40 mg Oral Daily Eulas Post, MD      . haloperidol (HALDOL) tablet 0.5 mg  0.5 mg Oral BID [name], [name], MD   0.5 mg at 04/10/20 1014  . haloperidol (HALDOL) tablet 2 mg  2 mg Oral Q6H PRN [name], Madie Reno, MD       Or  . haloperidol lactate (HALDOL) injection 1 mg  1 mg Intravenous Q6H PRN [name], [name], MD      . hydrALAZINE  (APRESOLINE) tablet 50 mg  50 mg Oral Q6H PRN [name], [name], MD      . lamoTRIgine (LAMICTAL) tablet 150 mg  150 mg Oral BID [name], Madie Reno, MD   150 mg at 04/10/20 1014  . levETIRAcetam (KEPPRA) tablet 1,000 mg  1,000 mg Oral BID [name], [name], MD   1,000 mg at 04/10/20 1013  . magnesium hydroxide (MILK OF MAGNESIA) suspension 30 mL  30 mL Oral Daily PRN [name], [name], MD      . OLANZapine (ZYPREXA) injection 10 mg  10 mg Intramuscular Q6H PRN [name], [name], MD      . OLANZapine zydis (ZYPREXA) disintegrating tablet 12.5 mg  12.5 mg Oral QHS Eulas Post, MD      . ondansetron [facility]) tablet 4 mg  4 mg Oral Q6H PRN [name], Madie Reno, MD       Or  . ondansetron (ZOFRAN) injection 4 mg  4 mg Intravenous Q6H PRN [name], [name], MD      . thiamine tablet 100 mg  100 mg Oral Daily [name], [name], MD   100 mg at 04/10/20 1014    Lab Results: No results found for this or any previous visit (from the past 48 hour(s)).  Blood Alcohol level:  Lab Results  Component Value Date   ETH <10 11/03/2019   ETH <5 80/16/5537    Metabolic Disorder Labs: No results found for: HGBA1C, MPG No results found for: PROLACTIN No results found for: CHOL, TRIG, HDL, CHOLHDL, VLDL, LDLCALC  Physical Findings: AIMS:  , ,  ,  ,   not applicable now  CIWA:    COWS:     Musculoskeletal: Strength & Muscle Tone: on one to one unsteady gait  Gait & Station: same Patient leans: n/a   Psychiatric Specialty Exam: Physical Exam  Review of Systems  Blood pressure 124/84, pulse 76, temperature 97.6 F (36.4 C), temperature source Oral, resp. rate 18, height 5' 9" (1.753 m), weight 62.6 kg, SpO2 100 %.Body mass index is 20.38 kg/m.  Mental Status   Alert somewhat cooperative oriented to person place part of time Consciousness not clouded or fluctuant Concentration and attention fair to poor  Speech garbled mumbles does not make sense  Judgement insight reliability all poor  Movements none  No  seizures reported no shakes or tremors Fund of knowledge cognition and intelligence previously okay now declining  Thought process ---illogical disorganized LOA  Thought content ---cannot make out  Not making sense at baseline SI and HI none for now  No tics or ocd traits Sleep --variable ADL's--needs help  \abstraction --concrete Mood somewhat depressed affect somewhat flat  Anxious at times   Continues all meds                                                         Treatment Plan Summary:  MGT here then probable NH placement  Eulas Post, MD 04/10/2020, 1:11 PM

## 2020-04-10 NOTE — Plan of Care (Signed)
  Problem: Education: Goal: Knowledge of [facility] Education information/materials will improve Outcome: Not Progressing Goal: Emotional status will improve Outcome: Not Progressing Goal: Mental status will improve Outcome: Not Progressing Goal: Verbalization of understanding the information provided will improve Outcome: Not Progressing   Problem: Health Behavior/Discharge Planning: Goal: Identification of resources available to assist in meeting health care needs will improve Outcome: Not Progressing Goal: Compliance with treatment plan for underlying cause of condition will improve Outcome: Not Progressing   Problem: Physical Regulation: Goal: Ability to maintain clinical measurements within normal limits will improve Outcome: Not Progressing   Problem: Safety: Goal: Periods of time without injury will increase Outcome: Not Progressing   Problem: Education: Goal: Verbalization of understanding the information provided will improve Outcome: Not Progressing   Problem: Coping: Goal: Ability to demonstrate appropriate behavior when angry will improve Outcome: Not Progressing Goal: Ability to identify and develop effective coping behavior will improve Outcome: Not Progressing Goal: Ability to interact with others will improve Outcome: Not Progressing

## 2020-04-10 NOTE — [facility] Counselor (Signed)
Adult Comprehensive Assessment  Patient ID: Lance Ray, male   DOB: Mar 05, 1971, 49 y.o.   MRN: [id number]  Information Source: Information source: Patient  Current Stressors:  Patient states their primary concerns and needs for treatment are:: Seizures Patient states their goals for this hospitilization and ongoing recovery are:: Not sure Educational / Learning stressors: Not currently in school. No learning disabilities Employment / Job issues: Not at this time Family Relationships: Live alone. Speak with family over the phone Financial / Lack of resources (include bankruptcy): No income. Living off savings Housing / Lack of housing: Has own home Physical health (include injuries & life threatening diseases): Seizures Social relationships: No close friends. Speaks with family over the phone Substance abuse: Tobacco Bereavement / Loss: Not at this time  Living/Environment/Situation:  Living Arrangements: Alone Living conditions (as described by patient or guardian): Fair, alot of cleaning up to do Who else lives in the home?: No one How long has patient lived in current situation?: Can't answer question What is atmosphere in current home: Other (Comment) (Lives alone and says its okay)  Family History:  Marital status: Single Are you sexually active?: No (Not at this time) What is your sexual orientation?: Likes woman Has your sexual activity been affected by drugs, alcohol, medication, or emotional stress?: N/a Does patient have children?: No  Childhood History:  By whom was/is the patient raised?: Mother Additional childhood history information: Father passed in 21 Description of patient's relationship with caregiver when they were a child: Fine as expected Patient's description of current relationship with people who raised him/her: Mom lives in [place]. 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) Has patient been affected by domestic violence as an adult?: No Description of domestic violence: N/a  Education:  Highest grade of school patient has completed: High school, Two years of college Currently a Consulting civil engineer?: No Learning disability?: No  Employment/Work Situation:   Employment situation: Unemployed Patient's job has been impacted by current illness: Yes Describe how patient's job has been impacted: Seizures What is the longest time patient has a held a job?: 20 years 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 Has patient ever been in the [employer]?: No  Financial Resources:   Financial resources: No income Does patient have a Lawyer or guardian?: No  Alcohol/Substance Abuse:   What has been your use of drugs/alcohol within the last 12 months?: Tobacco If attempted suicide, did drugs/alcohol play a role in this?: No Alcohol/Substance Abuse Treatment Hx: Denies past history Has alcohol/substance abuse ever caused legal problems?: No  Social Support System:   Conservation officer, nature Support System: Poor ("Bad") Describe Community Support System: None Type of faith/religion: No How does patient's faith help to cope with current illness?: N/a  Leisure/Recreation:   Do You Have Hobbies?: Yes Leisure and Hobbies: Watch televison and play on game system  Strengths/Needs:   What is the patient's perception of their strengths?: Helping people,  police background Patient  states they can use these personal strengths during their treatment to contribute to their recovery: Not sure Patient states these barriers may affect/interfere with their treatment: Only medication Patient states these barriers may affect their return to the community: Needs help getting there Other important information patient would like considered in planning for their treatment: None  Discharge Plan:   Currently receiving community mental health services: No (Not at this time) Patient states concerns and preferences for aftercare planning are: Needs help getting home and insurance to get medication Patient states they will know when they are safe and ready for discharge when: Improving short term memory Does patient have access to transportation?: No (Needs help from one of his sisters) Does patient have financial barriers related to discharge medications?: Yes (Insurance is needed) Patient description of barriers related to discharge medications: Insurance Plan for no access to transportation at discharge: Needs help from sister Will patient be returning to same living situation after discharge?: Yes  Summary/Recommendations:   Summary and Recommendations (to be completed by the evaluator): [name] admitted due to having a recurrent tonic-clonic seizure. Patient has a history of having a seizure disorder. Patient has been diagnosed with Dementia. Patient stated his primary stressors are his seizures and getting his medications. Patient denies any drug usage besides Tobacco. Patient also denied SI/HI. Patient is currently not receiving any mental health services and is not interested in having a provider at discharge at this time. Patient will benefit from crisis stabilization, medication evaluation, group therapy and psychoeducation, in addition to case management for discharge planning. At discharge it is recommended that Patient adhere to the  established discharge plan and continue in treatment.  Susa Simmonds. 04/10/2020

## 2020-04-10 NOTE — Progress Notes (Signed)
Patient continues to be on 1:1 with sitter in place. He is pleasant and responds to name only.  He is not alert to place, time or situation.  He is isolative to bed and needs assistance with ambulation. His speech is incoherent and not logical, but will nod and respond when asked questions.  He received his prescribed med and tolerated without incident.  He remains safe on the nit with the sitter in place and 15 minute safety checks.

## 2020-04-11 NOTE — Progress Notes (Signed)
Patient ID: Lance Ray, male   DOB: 05-01-1971, 49 y.o.   MRN: [id number]     [facility] MD Progress Note  04/11/2020 2:19 PM Lance Ray  MRN:  [id number] Principal Psychiatric Diagnosis: Dementia (HCC) Diagnosis: Principal Problem:   Dementia (HCC) Active Problems:   Recurrent seizures (HCC)   Psychosis (HCC)   Failure to thrive in adult   Total Time spent with patient:   25   DAILY NARRATIVE-- he is about the same  Awaits NH placement   Subjective:  Remains in haggard state in dementia with dysfunction and need for assistance with walking and ADL's   Objective:   He  Is not making sense, remains in room, has poor judgement and insight and does not have capacity for consent   Continues all meds   Physical Findings: AIMS:  , ,  ,  ,    CIWA:    COWS:     PHYSICAL EXAM  Physical Exam  SYSTEMS REVIEW  Review of Systems  MENTAL STATUS  Haggard whizened, forlorn disheveled Oriented to part of date and name Consciousness not clouded or fluctuatn Mood depressed affect constricted No shakes or tremors Judgement insight reliability all poor  Fund of knowledge and cogntion declining Patient does not meet capacity for consent Rapport poor  Eye contact fair Memory impaired mostly short term and recent Unclear SI HI with plans or not  Concentration and attention poor Thought process and content --generally now illogical not making sense, LOA ---at times word salad  No seizures observed No side effects      Current Medications: Current Facility-Administered Medications  Medication Dose Route Frequency Provider Last Rate Last Admin  . acetaminophen (TYLENOL) tablet 650 mg  650 mg Oral Q6H PRN [name], [name], MD      . alum & mag hydroxide-simeth (MAALOX/MYLANTA) 200-200-20 MG/5ML suspension 30 mL  30 mL Oral Q4H PRN [name], [name], MD      . benztropine (COGENTIN) tablet 1 mg  1 mg Oral BID [name], [name], MD   1 mg at 04/11/20 1053  . clonazePAM  (KLONOPIN) disintegrating tablet 0.5 mg  0.5 mg Oral BID [name], MD   0.5 mg at 04/11/20 1054  . DULoxetine (CYMBALTA) DR capsule 40 mg  40 mg Oral Daily [name], MD   40 mg at 04/11/20 1053  . haloperidol (HALDOL) tablet 0.5 mg  0.5 mg Oral BID [name], [name], MD   0.5 mg at 04/11/20 1055  . haloperidol (HALDOL) tablet 2 mg  2 mg Oral Q6H PRN [name], [name], MD       Or  . haloperidol lactate (HALDOL) injection 1 mg  1 mg Intravenous Q6H PRN [name], [name], MD      . hydrALAZINE (APRESOLINE) tablet 50 mg  50 mg Oral Q6H PRN [name], [name], MD      . lamoTRIgine (LAMICTAL) tablet 150 mg  150 mg Oral BID [name], [name], MD   150 mg at 04/11/20 1054  . levETIRAcetam (KEPPRA) tablet 1,000 mg  1,000 mg Oral BID [name], [name], MD   1,000 mg at 04/11/20 1054  . magnesium hydroxide (MILK OF MAGNESIA) suspension 30 mL  30 mL Oral Daily PRN [name], [name], MD      . OLANZapine (ZYPREXA) injection 10 mg  10 mg Intramuscular Q6H PRN [name], [name], MD      . OLANZapine zydis (ZYPREXA) disintegrating tablet 12.5 mg  12.5 mg Oral  Sharlyne Pacas, MD   12.5 mg at 04/10/20 2151  . ondansetron (ZOFRAN) tablet 4 mg  4 mg Oral Q6H PRN [name], [name], MD       Or  . ondansetron (ZOFRAN) injection 4 mg  4 mg Intravenous Q6H PRN [name], [name], MD      . thiamine tablet 100 mg  100 mg Oral Daily [name], [name], MD   100 mg at 04/11/20 1053    Lab Results: No results found for this or any previous visit (from the past 48 hour(s)).  Blood Alcohol level:  Lab Results  Component Value Date   ETH <10 11/03/2019   ETH <5 12/05/2016    Metabolic Disorder Labs: No results found for: HGBA1C, MPG No results found for: PROLACTIN No results found for: CHOL, TRIG, HDL, CHOLHDL, VLDL, LDLCALC   Past Psychiatric History:   Past Medical/Surgical History  Past Medical History:  Diagnosis Date  . Seizures (HCC)     Past Surgical History:  Procedure Laterality Date  . SKIN  GRAFT      Family Medical and Psychiatric History:   Family History  Problem Relation Age of Onset  . Seizures Mother     Social History:   Social History   Substance and Sexual Activity  Alcohol Use Yes  . Alcohol/week: 2.0 standard drinks  . Types: 2 Shots of liquor per week   Comment: "rarely"      Social History   Substance and Sexual Activity  Drug Use No    Social History   Socioeconomic History  . Marital status: Single    Spouse name: Not on file  . Number of children: Not on file  . Years of education: Not on file  . Highest education level: Not on file  Occupational History  . Not on file  Tobacco Use  . Smoking status: Current Some Day Smoker  . Smokeless tobacco: Current User  Vaping Use  . Vaping Use: Never used  Substance and Sexual Activity  . Alcohol use: Yes    Alcohol/week: 2.0 standard drinks    Types: 2 Shots of liquor per week    Comment: "rarely"   . Drug use: No  . Sexual activity: Yes  Other Topics Concern  . Not on file  Social History Narrative  . Not on file   Social Determinants of Health   Financial Resource Strain:   . Difficulty of Paying Living Expenses:   Food Insecurity:   . Worried About Programme researcher, broadcasting/film/video in the Last Year:   . Barista in the Last Year:   Transportation Needs:   . Freight forwarder (Medical):   [name] Lack of Transportation (Non-Medical):   Physical Activity:   . Days of Exercise per Week:   . Minutes of Exercise per Session:   Stress:   . Feeling of Stress :   Social Connections:   . Frequency of Communication with Friends and Family:   . Frequency of Social Gatherings with Friends and Family:   . Attends Religious Services:   . Active Member of Clubs or Organizations:   . Attends Banker Meetings:   [name] Marital Status:                             Summary:  Patient does not meet capacity for consent attempting to notify sisters of need for power of attorney  and  NH placement   Prescription Plan:  Same for now   Estimated Length of Stay:  Possibly until placement   [name], MD 04/11/2020, 2:19 PM

## 2020-04-11 NOTE — Progress Notes (Signed)
Patient is alert and oriented to person. He is pleasant and cooperative. Patient continues to be unsteady when on his feet. He continues to be on 1:1 and needs moderate assist with gait belt. He denies SI/HI/AH/VH depression, anxiety, and pain at this encounter. He received his prescribed medication and tolerated without incident.  He remains safe on the unit with the sitter and 15 minute safety checks.    Cleo Butler-Nicholson, LPN

## 2020-04-11 NOTE — [facility] Group Notes (Signed)
[facility] LCSW Group Therapy Note  Date/Time:  04/11/2020 1:54 PM- 2:44 PM   Type of Therapy and Topic:  Group Therapy:  Healthy and Unhealthy Supports  Participation Level:  Active   Description of Group:  Patients in this group were introduced to the idea of adding a variety of healthy supports to address the various needs in their lives.Patients discussed what additional healthy supports could be helpful in their recovery and wellness after discharge in order to prevent future hospitalizations.   An emphasis was placed on using counselor, doctor, therapy groups, 12-step groups, and problem-specific support groups to expand supports.  They also worked as a group on developing a specific plan for several patients to deal with unhealthy supports through boundary-setting, psychoeducation with loved ones, and even termination of relationships.   Therapeutic Goals:   1)  discuss importance of adding supports to stay well once out of the hospital  2)  compare healthy versus unhealthy supports and identify some examples of each  3)  generate ideas and descriptions of healthy supports that can be added  4)  offer mutual support about how to address unhealthy supports  5)  encourage active participation in and adherence to discharge plan    Summary of Patient Progress:  Patient checked into group doing okay. Patient was hard to understand and CSW was unsure who he sees as a healthy and unhealthy support to him. Patient would randomly start mumbling to questions that were asked to the group. CSW was unsure what patient was saying. Patient was alert doing group and participated the whole time.      Therapeutic Modalities:   Motivational Interviewing Brief Solution-Focused Therapy  Susa Simmonds, Theresia Majors 04/11/2020  3:46 PM

## 2020-04-11 NOTE — Tx Team (Signed)
Interdisciplinary Treatment and Diagnostic Plan Update  04/11/2020 Time of Session: 11:46 AM Lance Ray MRN: [id number]  Principal Diagnosis: Dementia (HCC)  Secondary Diagnoses: Principal Problem:   Dementia (HCC) Active Problems:   Recurrent seizures (HCC)   Psychosis (HCC)   Failure to thrive in adult   Current Medications:  Current Facility-Administered Medications  Medication Dose Route Frequency Provider Last Rate Last Admin   acetaminophen (TYLENOL) tablet 650 mg  650 mg Oral Q6H PRN [name], Lance Denmark, MD       alum & mag hydroxide-simeth (MAALOX/MYLANTA) 200-200-20 MG/5ML suspension 30 mL  30 mL Oral Q4H PRN [name], Lance Denmark, MD       benztropine (COGENTIN) tablet 1 mg  1 mg Oral BID [name], Lance Denmark, MD   1 mg at 04/11/20 1053   clonazePAM (KLONOPIN) disintegrating tablet 0.5 mg  0.5 mg Oral BID Lance Messier, MD   0.5 mg at 04/11/20 1054   DULoxetine (CYMBALTA) DR capsule 40 mg  40 mg Oral Daily Lance Messier, MD   40 mg at 04/11/20 1053   haloperidol (HALDOL) tablet 0.5 mg  0.5 mg Oral BID [name], Lance T, MD   0.5 mg at 04/11/20 1055   haloperidol (HALDOL) tablet 2 mg  2 mg Oral Q6H PRN [name], Lance Denmark, MD       Or   haloperidol lactate (HALDOL) injection 1 mg  1 mg Intravenous Q6H PRN [name], Lance Denmark, MD       hydrALAZINE (APRESOLINE) tablet 50 mg  50 mg Oral Q6H PRN [name], Lance Denmark, MD       lamoTRIgine (LAMICTAL) tablet 150 mg  150 mg Oral BID [name], Lance T, MD   150 mg at 04/11/20 1054   levETIRAcetam (KEPPRA) tablet 1,000 mg  1,000 mg Oral BID [name], Lance T, MD   1,000 mg at 04/11/20 1054   magnesium hydroxide (MILK OF MAGNESIA) suspension 30 mL  30 mL Oral Daily PRN [name], Lance Denmark, MD       OLANZapine (ZYPREXA) injection 10 mg  10 mg Intramuscular Q6H PRN [name], Lance T, MD       OLANZapine zydis (ZYPREXA) disintegrating tablet 12.5 mg  12.5 mg Oral QHS Lance Messier, MD   12.5 mg at 04/10/20 2151   ondansetron (ZOFRAN) tablet 4 mg   4 mg Oral Q6H PRN [name], Lance Denmark, MD       Or   ondansetron (ZOFRAN) injection 4 mg  4 mg Intravenous Q6H PRN [name], Lance T, MD       thiamine tablet 100 mg  100 mg Oral Daily [name], Lance T, MD   100 mg at 04/11/20 1053   PTA Medications: Medications Prior to Admission  Medication Sig Dispense Refill Last Dose   benztropine (COGENTIN) 1 MG tablet Take 1 tablet (1 mg total) by mouth 2 (two) times daily. 60 tablet 0    clonazePAM (KLONOPIN) 0.25 MG disintegrating tablet Take 1 tablet (0.25 mg total) by mouth 2 (two) times daily. 60 tablet 0    DULoxetine (CYMBALTA) 30 MG capsule Take 1 capsule (30 mg total) by mouth daily.  3    haloperidol (HALDOL) 0.5 MG tablet Take 0.5 tablets (0.25 mg total) by mouth 2 (two) times daily.      lamoTRIgine (LAMICTAL) 150 MG tablet Take 1 tablet (150 mg total) by mouth 2 (two) times daily.      levETIRAcetam (KEPPRA) 1000 MG tablet Take 1 tablet (1,000 mg total) by mouth 2 (two) times  daily.      OLANZapine zydis (ZYPREXA) 10 MG disintegrating tablet Take 1 tablet (10 mg total) by mouth at bedtime. 30 tablet 0    thiamine 100 MG tablet Take 1 tablet (100 mg total) by mouth daily.       Patient Stressors: Financial difficulties Health problems Medication change or noncompliance Substance abuse Traumatic event  Patient Strengths: Motivation for treatment/growth Supportive family/friends  Treatment Modalities: Medication Management, Group therapy, Case management,  1 to 1 session with clinician, Psychoeducation, Recreational therapy.   Physician Treatment Plan for Primary Diagnosis: Dementia [facility]) Long Term Goal(s): Improvement in symptoms so as ready for discharge Improvement in symptoms so as ready for discharge   Short Term Goals: Ability to identify and develop effective coping behaviors will improve Ability to maintain clinical measurements within normal limits will improve Compliance with prescribed medications will  improve Ability to demonstrate self-control will improve Compliance with prescribed medications will improve  Medication Management: Evaluate patient's response, side effects, and tolerance of medication regimen.  Therapeutic Interventions: 1 to 1 sessions, Unit Group sessions and Medication administration.  Evaluation of Outcomes: Not Progressing  Physician Treatment Plan for Secondary Diagnosis: Principal Problem:   Dementia (HCC) Active Problems:   Recurrent seizures (HCC)   Psychosis (HCC)   Failure to thrive in adult  Long Term Goal(s): Improvement in symptoms so as ready for discharge Improvement in symptoms so as ready for discharge   Short Term Goals: Ability to identify and develop effective coping behaviors will improve Ability to maintain clinical measurements within normal limits will improve Compliance with prescribed medications will improve Ability to demonstrate self-control will improve Compliance with prescribed medications will improve     Medication Management: Evaluate patient's response, side effects, and tolerance of medication regimen.  Therapeutic Interventions: 1 to 1 sessions, Unit Group sessions and Medication administration.  Evaluation of Outcomes: Not Progressing   RN Treatment Plan for Primary Diagnosis: Dementia (HCC) Long Term Goal(s): Knowledge of disease and therapeutic regimen to maintain health will improve  Short Term Goals: Ability to demonstrate self-control, Ability to participate in decision making will improve and Compliance with prescribed medications will improve  Medication Management: RN will administer medications as ordered by provider, will assess and evaluate patient's response and provide education to patient for prescribed medication. RN will report any adverse and/or side effects to prescribing provider.  Therapeutic Interventions: 1 on 1 counseling sessions, Psychoeducation, Medication administration, Evaluate responses  to treatment, Monitor vital signs and CBGs as ordered, Perform/monitor CIWA, COWS, AIMS and Fall Risk screenings as ordered, Perform wound care treatments as ordered.  Evaluation of Outcomes: Not Progressing   LCSW Treatment Plan for Primary Diagnosis: Dementia [facility]) Long Term Goal(s): Safe transition to appropriate next level of care at discharge, Engage patient in therapeutic group addressing interpersonal concerns.  Short Term Goals: Engage patient in aftercare planning with referrals and resources  Therapeutic Interventions: Assess for all discharge needs, 1 to 1 time with Social worker, Explore available resources and support systems, Assess for adequacy in community support network, Educate family and significant other(s) on suicide prevention, Complete Psychosocial Assessment, Interpersonal group therapy.  Evaluation of Outcomes: Not Progressing   Progress in Treatment: Attending groups: No. Participating in groups: No. Taking medication as prescribed: Yes. Toleration medication: Yes. Family/Significant other contact made: No, will contact:  Patient gave verbal consent to contact his sister  Patient understands diagnosis: No. Discussing patient identified problems/goals with staff: Yes. Medical problems stabilized or resolved: No. Denies suicidal/homicidal ideation:  Yes. Issues/concerns per patient self-inventory: No. Other: Patient was not aware of the date/year. Patient also did not fully understand why he was in the hospital   New problem(s) identified: No, Describe:  None  New Short Term/Long Term Goal(s): medication stabilization, elimination of SI thoughts, development of comprehensive mental wellness plan   Patient Goals:  Patient spoke about his seizures.  Discharge Plan or Barriers: Patient will be transitioning to a nursing home where they can meet his needs.   Reason for Continuation of Hospitalization: Other; describe Dementia   Estimated Length of Stay: 1-7  Days   Attendees: Patient: 04/11/2020 1:20 PM  Physician: Rodney Langton  04/11/2020 1:20 PM  Nursing:  04/11/2020 1:20 PM  RN Care Manager: 04/11/2020 1:20 PM  Social Worker: Cyril Loosen and Carollee Herter [place] Fant  04/11/2020 1:20 PM  Recreational Therapist:  04/11/2020 1:20 PM  Other:  04/11/2020 1:20 PM  Other:  04/11/2020 1:20 PM  Other: 04/11/2020 1:20 PM    Scribe for Treatment Team: Susa Simmonds, LCSWA 04/11/2020 1:20 PM

## 2020-04-11 NOTE — Plan of Care (Signed)
Patient remains on 1:1 with sitter at the bedside for safety. He still remains disorganized and he has visual hallucinations he is responding to. He was compliant with medication regime but he needed assistance getting pills and medicine to his mouth. He is currently in bed resting quietly.

## 2020-04-11 NOTE — Plan of Care (Signed)
  Problem: Education: Goal: Knowledge of [facility] Education information/materials will improve Outcome: Not Progressing Goal: Emotional status will improve Outcome: Not Progressing Goal: Mental status will improve Outcome: Not Progressing Goal: Verbalization of understanding the information provided will improve Outcome: Not Progressing   Problem: Health Behavior/Discharge Planning: Goal: Identification of resources available to assist in meeting health care needs will improve Outcome: Not Progressing Goal: Compliance with treatment plan for underlying cause of condition will improve Outcome: Not Progressing   Problem: Physical Regulation: Goal: Ability to maintain clinical measurements within normal limits will improve Outcome: Not Progressing   Problem: Safety: Goal: Periods of time without injury will increase Outcome: Not Progressing   Problem: Education: Goal: Verbalization of understanding the information provided will improve Outcome: Not Progressing   Problem: Coping: Goal: Ability to demonstrate appropriate behavior when angry will improve Outcome: Not Progressing Goal: Ability to identify and develop effective coping behavior will improve Outcome: Not Progressing Goal: Ability to interact with others will improve Outcome: Not Progressing   

## 2020-04-11 NOTE — Progress Notes (Addendum)
Pt is alert and oriented to person, and year, but not to situation or place. Pt is with 1:1 sitter for safety. Pt is calm, cooperative, confused, forgets where he is at times, reports, "I just washed the dishes yesterday." Pt was provided reality orientation. Pt walked with walker and gait belt and sitter hands on gait belt assist. Pt is unsteady and weak. Pt denies suicidal and homicidal ideation, denies hallucinations, denies feelings of depression and anxiety. Pt naps at times, rests quietly in bed, attended and participated some in SW group in the afternoon. Pt requires assist to the bathroom, and with ADLs. Pt's appetite is fair. No distress noted, none reported. Will continue to monitor pt per Q15 minute face checks and monitor for safety and progress.   Pt attempted to use urinal, was not able to use it without spilling urine on him. Pt was given new set of scrubs, was unable to dress himself, required assistance. Pt later noted to be in bed, responding to internal stimuli, a lot of self talk noted, word salad, some phrases are nonsensical and mumbling, at other times clearer self talk noted, and pt makes reference about his sister having visited him earlier, however pt has not had a visitor.

## 2020-04-12 MED ORDER — HALOPERIDOL 1 MG PO TABS
2.0000 mg | ORAL_TABLET | Freq: Every day | ORAL | Status: DC
  Administered 2020-04-13 – 2020-04-14 (×2): 2 mg via ORAL
  Filled 2020-04-12 (×2): qty 2

## 2020-04-12 NOTE — Progress Notes (Signed)
Recreation Therapy Notes   Date: 04/12/2020  Time: 9:30 am   Location: Craft room     Behavioral response: N/A   Intervention Topic: Stress Management   Discussion/Intervention: Patient did not attend group.   Clinical Observations/Feedback:  Patient did not attend group.   Raesha Coonrod LRT/CTRS        Tonea Leiphart 04/12/2020 11:51 AM

## 2020-04-12 NOTE — Plan of Care (Signed)
Patient unable to answer assessment questions with this writer this evening  Problem: Education: Goal: Emotional status will improve Outcome: Not Progressing Goal: Mental status will improve Outcome: Not Progressing

## 2020-04-12 NOTE — Progress Notes (Signed)
Patient ID: Lance Ray, male   DOB: [birth year], 49 y.o.   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[place])   Psychosis ([place])   Failure to thrive in adult   Total Time spent with patient: 30 -40 DAILY NARRATIVE  Subjective:----  About the same he does make not general sense illogical and is of issue ---  Objective:  --Remains in bed ---needs PT still and possible SNF--   Physical Findings: AIMS:  , ,  ,  ,     Not at this time  CIWA:    COWS:     PHYSICAL EXAM  Physical Exam  SYSTEMS REVIEW  Review of Systems  MENTAL STATUS----  Alert cooperative oriented to person Consciousness not clouded or fluctuant Judgment insight and reliability poor Intelligence cognition and fund of knowledge poor   ADLS'---needs assistance needs transfer to SNF  Concentration and attention poor  Memory --cannot assess too ill SI and HI he denies Rapport poor Remains in room mostly on 1;1 Eye contact fair to poor Mumbles and does not make sense generally with thought process and content Appetite fair  Sleep variable  Movements no shakes and tremors       Current Medications: Current Facility-Administered Medications  Medication Dose Route Frequency Provider Last Rate Last Admin  . acetaminophen (TYLENOL) tablet 650 mg  650 mg Oral Q6H PRN [name], [name], MD      . alum & mag hydroxide-simeth (MAALOX/MYLANTA) 200-200-20 MG/5ML suspension 30 mL  30 mL Oral Q4H PRN [name], [name], MD      . benztropine (COGENTIN) tablet 1 mg  1 mg Oral BID [name], Madie Reno, MD   1 mg at 04/12/20 0807  . clonazePAM (KLONOPIN) disintegrating tablet 0.5 mg  0.5 mg Oral BID Eulas Post, MD   0.5 mg at 04/12/20 0806  . DULoxetine (CYMBALTA) DR capsule 40 mg  40 mg Oral Daily Eulas Post, MD   40 mg at  04/12/20 0806  . haloperidol (HALDOL) tablet 2 mg  2 mg Oral Q6H PRN [name], Madie Reno, MD       Or  . haloperidol lactate (HALDOL) injection 1 mg  1 mg Intravenous Q6H PRN [name], Madie Reno, MD      . Derrill Memo ON 04/13/2020] haloperidol (HALDOL) tablet 2 mg  2 mg Oral QHS Eulas Post, MD      . hydrALAZINE (APRESOLINE) tablet 50 mg  50 mg Oral Q6H PRN [name], [name], MD      . lamoTRIgine (LAMICTAL) tablet 150 mg  150 mg Oral BID [name], Madie Reno, MD   150 mg at 04/12/20 0806  . levETIRAcetam (KEPPRA) tablet 1,000 mg  1,000 mg Oral BID [name], Madie Reno, MD   1,000 mg at 04/12/20 0806  . magnesium hydroxide (MILK OF MAGNESIA) suspension 30 mL  30 mL Oral Daily PRN [name], [name], MD      . OLANZapine (ZYPREXA) injection 10 mg  10 mg Intramuscular Q6H PRN [name], [name], MD      . OLANZapine zydis (ZYPREXA) disintegrating tablet 12.5 mg  12.5 mg Oral QHS Eulas Post, MD   12.5 mg at 04/11/20 2114  . ondansetron (ZOFRAN) tablet 4 mg  4 mg Oral Q6H PRN [name], Madie Reno, MD       Or  .  ondansetron (ZOFRAN) injection 4 mg  4 mg Intravenous Q6H PRN [name], [name], MD      . thiamine tablet 100 mg  100 mg Oral Daily [name], Madie Reno, MD   100 mg at 04/12/20 5643    Lab Results: No results found for this or any previous visit (from the past 48 hour(s)).  Blood Alcohol level:  Lab Results  Component Value Date   ETH <10 11/03/2019   ETH <5 32/95/1884    Metabolic Disorder Labs: No results found for: HGBA1C, MPG No results found for: PROLACTIN No results found for: CHOL, TRIG, HDL, CHOLHDL, VLDL, LDLCALC   Past Psychiatric History:   Past Medical/Surgical History  Past Medical History:  Diagnosis Date  . Seizures ([place])     Past Surgical History:  Procedure Laterality Date  . SKIN GRAFT      Family Medical and Psychiatric History:   Family History  Problem Relation Age of Onset  . Seizures Mother     Social History:   Social History   Substance and Sexual Activity   Alcohol Use Yes  . Alcohol/week: 2.0 standard drinks  . Types: 2 Shots of liquor per week   Comment: "rarely"      Social History   Substance and Sexual Activity  Drug Use No    Social History   Socioeconomic History  . Marital status: Single    Spouse name: Not on file  . Number of children: Not on file  . Years of education: Not on file  . Highest education level: Not on file  Occupational History  . Not on file  Tobacco Use  . Smoking status: Current Some Day Smoker  . Smokeless tobacco: Current User  Vaping Use  . Vaping Use: Never used  Substance and Sexual Activity  . Alcohol use: Yes    Alcohol/week: 2.0 standard drinks    Types: 2 Shots of liquor per week    Comment: "rarely"   . Drug use: No  . Sexual activity: Yes  Other Topics Concern  . Not on file  Social History Narrative  . Not on file   Social Determinants of Health   Financial Resource Strain:   . Difficulty of Paying Living Expenses:   Food Insecurity:   . Worried About Charity fundraiser in the Last Year:   . Arboriculturist in the Last Year:   Transportation Needs:   . Film/video editor (Medical):   [name] Lack of Transportation (Non-Medical):   Physical Activity:   . Days of Exercise per Week:   . Minutes of Exercise per Session:   Stress:   . Feeling of Stress :   Social Connections:   . Frequency of Communication with Friends and Family:   . Frequency of Social Gatherings with Friends and Family:   . Attends Religious Services:   . Active Member of Clubs or Organizations:   . Attends Archivist Meetings:   [name] Marital Status:                             Summary:  Caucasian male found to be not having capacity for consent Met with sister by phone who will ask elder care attorney next steps  He actually now needs SNF for this time as psych has done due diligence in obs and meds     Prescription Plan:  Same   Estimated Length of Stay:  1-3  days   Eulas Post, MD 04/12/2020, 4:10 PM

## 2020-04-12 NOTE — Progress Notes (Signed)
Recreation Therapy Notes  INPATIENT RECREATION THERAPY ASSESSMENT  Patient Details Name: Lance Ray MRN: [id number] DOB: 1970/11/05 Today's Date: 04/12/2020       Information Obtained From: Chart Review  Able to Participate in Assessment/Interview:    Patient Presentation:    Reason for Admission (Per Patient): Active Symptoms, Med Non-Compliance  Patient Stressors:    Coping Skills:   TV  Leisure Interests (2+):  Individual - TV, Individual - Phone, Games - Video games  Frequency of Recreation/Participation:    Awareness of Community Resources:     [employer]:     Current Use:    If no, Barriers?:    Expressed Interest in [employer] Information:    [employer] of Residence:  Film/video editor  Patient Main Form of Transportation:    Patient Strengths:  N/A  Patient Identified Areas of Improvement:  N/A  Patient Goal for Hospitalization:  N/A  Current SI (including self-harm):     Current HI:     Current AVH:    Staff Intervention Plan: Collaborate with Interdisciplinary Treatment Team, Group Attendance  Consent to Intern Participation: N/A  Analeah Brame 04/12/2020, 2:36 PM

## 2020-04-12 NOTE — Progress Notes (Signed)
Patient remains on 1:1 for safety with a sitter present at bedside. Patient pleasant and compliant with medication administration per MD orders. Patient disorganized and unable to answer assessment questions with this Clinical research associate. Patient presents with a better affect than when this writer saw him on the unit last week. Patient remains safe on the unit.

## 2020-04-12 NOTE — Progress Notes (Signed)
Pt is alert and oriented to his name only. PT mumbles at times, it is incomprehensible. Pt has been sleeping most of the day, wakes and is able to take his medications. Pt has been with a 1:1 sitter for safety. Pt makes no gestures to harm self or others. Pt requires assist by sitter to the bathroom and to transfer in and out of bed and to toilet. PT also requires assist with urinal in order not to spill it. Pt spills it on his own, requires assist to change his scrubs is unable to put clothing on independently. Pt also has gait belt and walker that is in use when pt ambulates in addition to staff assist. Pt is only ambulate short distances, and is very unsteady. Pt is a high fall risk. Pt unable to follow simple instructions well, and pt is forgetful. Pt needs assistance with ADLs, meal set up, is able to feed self with meal set up but is confused and will attempt to use other objects as eating utensils that are laying nearby for example attempted to [place] a toothbrush like a spoon in his food, and did not redirect well would not give the tech the toothbrush back, did not understand the tech trying to tell pt that it was not his spoon and did not cooperate with tech attempting to exchange the toothbrush out with a spoon.  Pt has poor insight and judgment. Pt has been calm, no distress noted, none reported, pt voices no complaints, faces pain scale is 0 on a 0-10 scale, 10 being worst. Will continue to monitor pt on a 1:1 with staff sitter and will continue to monitor pt per Q15 minute face checks and monitor for safety and progress.

## 2020-04-13 ENCOUNTER — Telehealth: Payer: Self-pay | Admitting: General Practice

## 2020-04-13 NOTE — Progress Notes (Signed)
Recreation Therapy Notes  Date: 04/13/2020  Time: 9:30 am   Location: Craft room     Behavioral response: N/A   Intervention Topic: Values    Discussion/Intervention: Patient did not attend group.   Clinical Observations/Feedback:  Patient did not attend group.   Santiaga Butzin LRT/CTRS        Avyay Coger 04/13/2020 2:05 PM

## 2020-04-13 NOTE — Plan of Care (Signed)
Patient ambulated in the hallway again with walker and assistance.Denies SI,HI and AVH.Appetite and energy level good.Safety sitter at bedside.Support and encouragement given.

## 2020-04-13 NOTE — BHH Counselor (Signed)
CSW attempted to contact the patient's emergency contact, his sister Gloris [place], [phone].  CSW was sent to voicemail and voicemail box was full.  CSW was unable to leave a message.  Penni Homans, MSW, LCSW 04/13/2020 3:26 PM

## 2020-04-13 NOTE — BHH Counselor (Signed)
CSW reviewed the patient's chart for additional phone numbers.  CSW spoke with the pt's sister Ephriam Jenkins [phone].  CSW spoke with pt regarding lack of insurance and possibility that the patient will not be accepted to a SNF due to no insurance.  CSW discussed that per conversation with other CSW and Care Management staff, the patient may have to be discharged to the home with [facility] and medication management.    She reports that the family does not have financial resources to assist the patient.  She reports that patient can not come to her home because she can not "afford to".  She reports that the patient can not be alone due to his mental capacity.  She reports that the patient has a history of being noncompliant with medications.    She reiterated several times that the patient can NOT return to his home due to his current mental state.  Patient has no financial resources at this time.   Penni Homans, MSW, LCSW 04/13/2020 3:44 PM

## 2020-04-13 NOTE — Progress Notes (Signed)
Patient is resting with eyes closed.Safety sitter at bedside.

## 2020-04-13 NOTE — Progress Notes (Signed)
Patient is alert and oriented x 3.Patients mumbles and continues to have disorganized talks.Patient talks to him self and responding to stimuli.Denies SI,HI and AVH.Patient shows impulsive behaviors like getting out of bed and tried to walk.Patient ambulated with walker assisted with gait belt.While walking patient got irritated and turned back to the bed.Compliant with medications.Appetite and energy level good.Safety sitter with patient.Support and encouragement given.

## 2020-04-13 NOTE — Telephone Encounter (Signed)
Individual has been contacted 3+ times regarding ED referral. No further attempts to contact individual will be made. 

## 2020-04-13 NOTE — Progress Notes (Signed)
Patient ID: Lance Ray, male   DOB: 09-24-1971, 49 y.o.   MRN: [id number]    [facility] MD Progress Note  04/13/2020 4:07 PM [name]  MRN:  [id number] Principal Psychiatric Diagnosis: Dementia [name]) Diagnosis: Principal Problem:   Dementia ([facility]) Active Problems:   Recurrent seizures (H[facility])   Psychosis ([facility])   Failure to thrive in adult   Total Time spent with patient: 15    DAILY NARRATIVE--continues on unit in dysfunctional state needing PT and assistance  His ADL's are not independent and he has no capacity for consent   Subjective:  Still in bed less activity  Objective:    About the same remains in dysfunctional state   Physical Findings: AIMS:  , ,  ,  ,   --N/A  CIWA:    COWS:     PHYSICAL EXAM  Physical Exam  SYSTEMS REVIEW  Review of Systems  MENTAL STATUS  Pertinent---s only   Alert somewhat cooperative  Oriented to name  Not clouded or fluctuant Concentration and attention poor No side effects or movement  Problems  Still  In chronic psychotic state  Does not make sense ---despite meds and all Judgement insight reliability all poor this could be his new baseline  Looks thin and haggard   No new paranoia or internal voices and all however  SI and HI --does not endorse  Sleep about the same ADL's impaired Cognition in decline        Current Medications: Current Facility-Administered Medications  Medication Dose Route Frequency Provider Last Rate Last Admin  . acetaminophen (TYLENOL) tablet 650 mg  650 mg Oral Q6H PRN [name], [name], MD      . alum & mag hydroxide-simeth (MAALOX/MYLANTA) 200-200-20 MG/5ML suspension 30 mL  30 mL Oral Q4H PRN [name], [name], MD      . benztropine (COGENTIN) tablet 1 mg  1 mg Oral BID [name], [name], MD   1 mg at 04/13/20 0741  . clonazePAM (KLONOPIN) disintegrating tablet 0.5 mg  0.5 mg Oral BID [name], MD   0.5 mg at 04/13/20 0740  . DULoxetine (CYMBALTA) DR capsule 40 mg  40 mg Oral  Daily [name], MD   40 mg at 04/13/20 0741  . haloperidol (HALDOL) tablet 2 mg  2 mg Oral Q6H PRN [name], [name], MD   2 mg at 04/12/20 2133   Or  . haloperidol lactate (HALDOL) injection 1 mg  1 mg Intravenous Q6H PRN [name], [name], MD      . haloperidol (HALDOL) tablet 2 mg  2 mg Oral QHS [name], MD      . hydrALAZINE (APRESOLINE) tablet 50 mg  50 mg Oral Q6H PRN [name], [name], MD      . lamoTRIgine (LAMICTAL) tablet 150 mg  150 mg Oral BID [name], [name], MD   150 mg at 04/13/20 0741  . levETIRAcetam (KEPPRA) tablet 1,000 mg  1,000 mg Oral BID [name], [name], MD   1,000 mg at 04/13/20 0741  . magnesium hydroxide (MILK OF MAGNESIA) suspension 30 mL  30 mL Oral Daily PRN [name], [name], MD      . OLANZapine (ZYPREXA) injection 10 mg  10 mg Intramuscular Q6H PRN [name], [name], MD      . OLANZapine zydis (ZYPREXA) disintegrating tablet 12.5 mg  12.5 mg Oral QHS [name], MD   12.5 mg at 04/12/20 2133  . ondansetron (ZOFRAN) tablet 4 mg  4  mg Oral Q6H PRN [name], [name], MD       Or  . ondansetron (ZOFRAN) injection 4 mg  4 mg Intravenous Q6H PRN [name], [name], MD      . thiamine tablet 100 mg  100 mg Oral Daily [name], [name], MD   100 mg at 04/13/20 5809    Lab Results: No results found for this or any previous visit (from the past 48 hour(s)).  Blood Alcohol level:  Lab Results  Component Value Date   ETH <10 11/03/2019   ETH <5 12/05/2016    Metabolic Disorder Labs: No results found for: HGBA1C, MPG No results found for: PROLACTIN No results found for: CHOL, TRIG, HDL, CHOLHDL, VLDL, LDLCALC   Past Psychiatric History:   Past Medical/Surgical History  Past Medical History:  Diagnosis Date  . Seizures (HCC)     Past Surgical History:  Procedure Laterality Date  . SKIN GRAFT      Family Medical and Psychiatric History:   Family History  Problem Relation Age of Onset  . Seizures Mother     Social History:   Social History    Substance and Sexual Activity  Alcohol Use Yes  . Alcohol/week: 2.0 standard drinks  . Types: 2 Shots of liquor per week   Comment: "rarely"      Social History   Substance and Sexual Activity  Drug Use No    Social History   Socioeconomic History  . Marital status: Single    Spouse name: Not on file  . Number of children: Not on file  . Years of education: Not on file  . Highest education level: Not on file  Occupational History  . Not on file  Tobacco Use  . Smoking status: Current Some Day Smoker  . Smokeless tobacco: Current User  Vaping Use  . Vaping Use: Never used  Substance and Sexual Activity  . Alcohol use: Yes    Alcohol/week: 2.0 standard drinks    Types: 2 Shots of liquor per week    Comment: "rarely"   . Drug use: No  . Sexual activity: Yes  Other Topics Concern  . Not on file  Social History Narrative  . Not on file   Social Determinants of Health   Financial Resource Strain:   . Difficulty of Paying Living Expenses:   Food Insecurity:   . Worried About Programme researcher, broadcasting/film/video in the Last Year:   . Barista in the Last Year:   Transportation Needs:   . Freight forwarder (Medical):   [name] Lack of Transportation (Non-Medical):   Physical Activity:   . Days of Exercise per Week:   . Minutes of Exercise per Session:   Stress:   . Feeling of Stress :   Social Connections:   . Frequency of Communication with Friends and Family:   . Frequency of Social Gatherings with Friends and Family:   . Attends Religious Services:   . Active Member of Clubs or Organizations:   . Attends Banker Meetings:   [name] Marital Status:              I spoke to Sister again who will try the ER guardianship and APS to see if he can get a state appointed guardian.  Patient is not safe being at home due to severe seizure risk, fall risk ---return of severepsychosis due to non compliance inability to care for Self and also ----severe ETOH ism that  creates dangerous situation   Without ETOH he still is in danger at home.                Summary:    Prescription Plan:    Estimated Length of Stay:     [name], MD 04/13/2020, 4:07 PM

## 2020-04-14 NOTE — Plan of Care (Signed)
Patient remains in a confused mental state   Problem: Education: Goal: Emotional status will improve Outcome: Not Progressing Goal: Mental status will improve Outcome: Not Progressing

## 2020-04-14 NOTE — Plan of Care (Signed)
Pt denies depression, anxiety, SI, HI and AVH. Pt was educated on care plan and verbalizes understanding. Pt was encouraged to attend groups. Lance Mayers RN Problem: Education: Goal: Knowledge of [place] [facility] Education information/materials will improve Outcome: Progressing Goal: Emotional status will improve Outcome: Progressing Goal: Mental status will improve Outcome: Progressing Goal: Verbalization of understanding the information provided will improve Outcome: Progressing   Problem: Health Behavior/Discharge Planning: Goal: Identification of resources available to assist in meeting health care needs will improve Outcome: Progressing Goal: Compliance with treatment plan for underlying cause of condition will improve Outcome: Progressing   Problem: Physical Regulation: Goal: Ability to maintain clinical measurements within normal limits will improve Outcome: Progressing   Problem: Safety: Goal: Periods of time without injury will increase Outcome: Progressing   Problem: Education: Goal: Verbalization of understanding the information provided will improve Outcome: Progressing   Problem: Coping: Goal: Ability to demonstrate appropriate behavior when angry will improve Outcome: Progressing Goal: Ability to identify and develop effective coping behavior will improve Outcome: Progressing Goal: Ability to interact with others will improve Outcome: Progressing

## 2020-04-14 NOTE — Progress Notes (Signed)
Patient ID: Lance Ray, male   DOB: 10-31-1970, 49 y.o.   MRN: [id number]   [name]--  Brief Progress   Patient awaits placement -- He is about the same --- I spoke to his daughter who is now aware -- That she has to appoint a state guardian and appeal to the judge for emergency guardianship   Patient's mental statusis about the same with poor judgement insigh reliability and remanis in chronic state unable to have capacity for consent   Placement now pending  PT is much needed at this time   No other new medical problems including seizures

## 2020-04-14 NOTE — Progress Notes (Signed)
Patient remains on 1:1 for safety with a sitter present. Patient compliant with medication administration per MD orders. Patient remains unable to answer assessment questions other than denying SI/HI/AVH. Patient is confused. Patient remains safe on the unit.

## 2020-04-14 NOTE — BHH Counselor (Signed)
CSW spoke with the patient's sister Ephriam Jenkins, [phone].  She reports that she and her sister are going to court on Monday 04/19/2020 to file paperwork for guardianship on.  She reports that the [place] of Court has reported that currently the court system is backed up until September.    She reports that the patient has money in the bank, however she needs access to his SS card to get access.  CSW called the nurses station to see if this was a possibility, considering the patient lacks the capacity to make decisions per psychiatrists notes.  Nurse reports that she will follow up.  She reports that she has contacted [facility] and left a message with Ysidro Evert in regards to guardianship as well.  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    She reports that the family was unaware of the patient's mental capacity as reported by [name].  CSW reviewed the unlikely approval of a SNF due to lack of funds and possible referral for charity home health and medication management.  She reports that the patient "is a hermit and a loner".  She reports that the patient's home is "filthy" and his bathroom is "disgusting and needs to be gutted".  She reports that patient will not take his medication unless in a hospital setting.   She reports that patient "has a death wish" and often states that "he wish he would have died in that accident".  CSW informed sister that she will follow up.  Penni Homans, MSW, LCSW 04/14/2020 3:40 PM

## 2020-04-14 NOTE — Progress Notes (Signed)
MHTs gave pt a shower and changed sheets. Pt feels much better. Torrie Mayers RN

## 2020-04-14 NOTE — Progress Notes (Signed)
Recreation Therapy Notes  Date: 04/14/2020  Time: 9:30 am   Location: Courtyard      Behavioral response: N/A   Intervention Topic: Leisure   Discussion/Intervention: Patient did not attend group.   Clinical Observations/Feedback:  Patient did not attend group.   Evander Macaraeg LRT/CTRS        Irene Collings 04/14/2020 12:24 PM

## 2020-04-14 NOTE — Progress Notes (Signed)
Physical Therapy Treatment Patient Details Name: Lance Ray MRN: [id number] DOB: Apr 14, 1971 Today's Date: 04/14/2020    History of Present Illness Lance Ray was admitted on behavior health unit on 7/16 after being on medical unit for seizures x 2 weeks. Pt with recent diagnosis of dementia. Pt seen by PT on medical unit, now evaluated on BH. PMH includes epilepsy and depression.    PT Comments    Pt seated in wheelchair after returning from day room with 1:1 sitter. Pt with improved conversational skills today and one-step command following. Pt continues to require increased cues for multi-step commands. Noted improved gait quality from previous session. Pt continues to demonstrate ataxic gait qualities including difficulty with placement of foot during stance phase however movements were less exagerated while ambulating with RW and +1 mod A. Pt continues to be impulsive and demonstrates decreased safety awareness throughout session. Pt participated in standing balance activities and EOB therex with verbal cues for slow and controlled movements. Noted good immediate carryover of cues however unsure of long term retention. Five time sit <> stands performed in 8 seconds which is improved from initial evaluation. Pt also performed object finding and counting activities during functional activities. Pt making progress toward goals. Will continue to follow this acute stay and progress pt as tolerated.   Follow Up Recommendations  SNF;Supervision/Assistance - 24 hour     Equipment Recommendations  Rolling walker with 5" wheels    Recommendations for Other Services       Precautions / Restrictions Precautions Precautions: Fall Restrictions Weight Bearing Restrictions: No    Mobility  Bed Mobility Overal bed mobility: Needs Assistance Bed Mobility: Sit to Supine       Sit to supine: Supervision   General bed mobility comments: supv for safety; requires increased time to perform  and get aligned correctly in bed  Transfers Overall transfer level: Needs assistance Equipment used: None;1 person hand held assist;Rolling walker (2 wheeled) Transfers: Sit to/from Stand Sit to Stand: Min assist         General transfer comment: impulsive attempts of sit <> stand pt able to perform without physical assistance however BLE noted to press against bed for leverage; min A for sit <> stand from wheel chair for steadying and safety  Ambulation/Gait Ambulation/Gait assistance: Mod assist Gait Distance (Feet): 100 Feet Assistive device: Rolling walker (2 wheeled) Gait Pattern/deviations: Step-through pattern;Ataxic Gait velocity: decreased   General Gait Details: pt ambulated 100 feet using RW with min-mod A for steadying and safety; pt with varying gait patterns including high steppage and ataxic with varying BOS; lateral LOB required max A for balance; verbal cues for sequencing   Stairs             Wheelchair Mobility    Modified Rankin (Stroke Patients Only)       Balance Overall balance assessment: Needs assistance Sitting-balance support: Feet supported Sitting balance-Leahy Scale: Fair Sitting balance - Comments: pt kept bilateral feet on floor and with SBA for sitting balance; pt impulsively would lean forward at waist to look at something on floor requiring CGA for safety   Standing balance support: Bilateral upper extremity supported Standing balance-Leahy Scale: Fair Standing balance comment: CGA to min A for static standing balance and min-mod A for dynamic balance activities                High Level Balance Comments: pt with wide staggered stance and able to maintain with SBA-CGA with R foot forward, L  foot forward required min A for balance and unable to maintain            Cognition Arousal/Alertness: Awake/alert Behavior During Therapy: Impulsive;Anxious;Restless Overall Cognitive Status: Difficult to assess Area of Impairment:  Following commands;Safety/judgement;Awareness;Problem solving                 Orientation Level: Disoriented to;Time Current Attention Level: Alternating;Focused Memory: Decreased recall of precautions;Decreased short-term memory Following Commands: Follows one step commands consistently;Follows one step commands with increased time;Follows multi-step commands inconsistently;Follows multi-step commands with increased time Safety/Judgement: Decreased awareness of safety;Decreased awareness of deficits Awareness: Intellectual Problem Solving: Slow processing;Difficulty sequencing;Requires verbal cues;Requires tactile cues General Comments: pt unable to state month or year when asked      Exercises Other Exercises Other Exercises: LAQ x 10 bilaterally; verbal cues for slow and controlled movements with good immediate carryover; standing marches x 10 bilat with min-mod A for standing balance; 5 x STS in 8 seconds Other Exercises: pt performed object finding activities and counting during functional mobility/activities    General Comments        Pertinent Vitals/Pain Pain Assessment: No/denies pain    Home Living                      Prior Function            PT Goals (current goals can now be found in the care plan section) Acute Rehab PT Goals Patient Stated Goal: none stated PT Goal Formulation: Patient unable to participate in goal setting Time For Goal Achievement: 04/24/20 Potential to Achieve Goals: Fair Progress towards PT goals: Progressing toward goals    Frequency    Min 2X/week      PT Plan Current plan remains appropriate    Co-evaluation              AM-PAC PT "6 Clicks" Mobility   Outcome Measure  Help needed turning from your back to your side while in a flat bed without using bedrails?: A Little Help needed moving from lying on your back to sitting on the side of a flat bed without using bedrails?: A Little Help needed moving to  and from a bed to a chair (including a wheelchair)?: A Lot Help needed standing up from a chair using your arms (e.g., wheelchair or bedside chair)?: A Lot Help needed to walk in hospital room?: A Lot Help needed climbing 3-5 steps with a railing? : A Lot 6 Click Score: 14    End of Session Equipment Utilized During Treatment: Gait belt Activity Tolerance: Patient tolerated treatment well Patient left: in bed;with nursing/sitter in room Nurse Communication: Mobility status PT Visit Diagnosis: Unsteadiness on feet (R26.81);Other abnormalities of gait and mobility (R26.89);Muscle weakness (generalized) (M62.81)     Time: 3474-2595 PT Time Calculation (min) (ACUTE ONLY): 27 min  Charges:  $Gait Training: 8-22 mins $Therapeutic Exercise: 8-22 mins                     Frederich Chick, SPT   Gerrick Ray 04/14/2020, 4:39 PM

## 2020-04-14 NOTE — BHH Counselor (Signed)
CSW called pt's sister Ephriam Jenkins [phone].  [name] reports now is not a good time and she will call CSW back in 5 minutes.  Penni Homans, MSW, LCSW 04/14/2020 3:07 PM

## 2020-04-14 NOTE — Progress Notes (Signed)
Pt is asleep. Safe with a 1 to 1 sitter. Will give meds when wakes up. MD aware.   Torrie Mayers RN

## 2020-04-14 NOTE — Progress Notes (Signed)
D- Patient alert and oriented. Affect/mood is anxious and apprehensive. Pt denies SI, HI, AVH, and pain. Pt got a shower today and feels much better. Torrie Mayers RN  A- Scheduled medications administered to patient, per MD orders. Support and encouragement provided.  Routine safety checks conducted every 15 minutes.  Patient informed to notify staff with problems or concerns.  R- No adverse drug reactions noted. Patient contracts for safety at this time. Patient compliant with medications and treatment plan. Patient receptive, calm, and cooperative. Patient interacts well with others on the unit.  Patient remains safe at this time.  Torrie Mayers RN

## 2020-04-15 MED ORDER — HALOPERIDOL 1 MG PO TABS
3.0000 mg | ORAL_TABLET | Freq: Every day | ORAL | Status: DC
  Administered 2020-04-15 – 2020-05-07 (×22): 3 mg via ORAL
  Filled 2020-04-15 (×22): qty 3

## 2020-04-15 NOTE — Progress Notes (Signed)
Recreation Therapy Notes  Date: 04/15/2020  Time: 9:30 am   Location: Court yard    Behavioral response: N/A   Intervention Topic: Animal Assisted therapy    Discussion/Intervention: Patient did not attend group.   Clinical Observations/Feedback:  Patient did not attend group.   Aldea Avis LRT/CTRS        Talicia Sui 04/15/2020 11:53 AM

## 2020-04-15 NOTE — Progress Notes (Signed)
D- Patient alert and oriented. Pt has been sullen and impulsive.  Pt denies SI, HI, AVH, and pain. Pt has been out of his room today using the walker and wheelchair.   A- Scheduled medications administered to patient, per MD orders. Support and encouragement provided.  Routine safety checks conducted every 15 minutes.  Patient informed to notify staff with problems or concerns.  R- No adverse drug reactions noted. Patient contracts for safety at this time. Patient compliant with medications and treatment plan. Patient receptive, calm, and cooperative. Patient interacts well with others on the unit.  Patient remains safe at this time.  Pt still has a 1:1. Pt is safe,   Milagros Loll

## 2020-04-15 NOTE — Progress Notes (Signed)
Pt asleep. 1:1 sitter at bedside. Pt is safe.  Torrie Mayers RN

## 2020-04-15 NOTE — Progress Notes (Signed)
Patient continues to be impulsive and verbally abusive. Threatening to urinate on staff. Yelling at staff for touching him and attempting to keep him safe. Patient with unsteady gait and confusion. Remains with sitter at bedside for safety. Slept a good portion of shift. Continues to mumble and non sensicle. Encouragement and support provided, safety checks maintained. Patient remains safe with sitter at arms reach.

## 2020-04-15 NOTE — Progress Notes (Signed)
Pt is impulsive. Pt is a max assist. Pt need gait belt, walker or wheelchair. 1 :1 sitter is at bedside. Torrie Mayers RN

## 2020-04-15 NOTE — Progress Notes (Signed)
Patient ID: Lance Ray, male   DOB: [birth year], 49 y.o.   MRN: [id number]    [facility] MD Progress Note  04/15/2020 1:00 PM Lance Ray  MRN:  [id number] Principal Psychiatric Diagnosis: Dementia [facility]) Diagnosis: Principal Problem:   Dementia (HCC) Active Problems:   Recurrent seizures (HCC)   Psychosis (HCC)   Failure to thrive in adult   Total Time spent with patient: ---15-20  DAILY NARRATIVE  Subjective:  I am okay  Objective:  Patient somewhat presents well but then goes into illogical dialogue and does not make sense.  Very unreliable poor judgement insight reliability--  He does not meet capacity for consent His sister is trying to get ER guardianship and ER state funding via courts He claims he has no mental illness and he can function well at home   Reports say that his house is beyond [place], with trash and unkept unsanitary conditions long standing  He is not able to care for Self,  PT is in progress ---not cleared yet   And He needs help with ADL's   Needs NH placement ----  Tried to call sister to give his SS  Number   She needs his SS number which is --  [id number]  Meds okay with no side effects No evidence of seizures   Controlled now on meds   Mental Status Pertinents  Alert cooperative oriented to name and part of place  Poor judgement insight reliabiltiy SI --no clear however he says he is safe Thought process and content --not making sense , illogical disorganized ---no direction No frank voices however  Does not meet capacity for consent  Consciousness not clouded or fluctuant Concentration and attention fair  Sleep Okay ADL's impaired Cognition impaired       Physical Findings: AIMS:  , ,  ,  ,    CIWA:    COWS:     PHYSICAL EXAM  Physical Exam  SYSTEMS REVIEW  Review of Systems     Current Medications: Current Facility-Administered Medications  Medication Dose Route Frequency Provider Last Rate Last Admin  .  acetaminophen (TYLENOL) tablet 650 mg  650 mg Oral Q6H PRN [name], [name], MD      . alum & mag hydroxide-simeth (MAALOX/MYLANTA) 200-200-20 MG/5ML suspension 30 mL  30 mL Oral Q4H PRN [name], [name], MD      . benztropine (COGENTIN) tablet 1 mg  1 mg Oral BID [name], [name], MD   1 mg at 04/15/20 1025  . clonazePAM (KLONOPIN) disintegrating tablet 0.5 mg  0.5 mg Oral BID [name], MD   0.5 mg at 04/15/20 1027  . DULoxetine (CYMBALTA) DR capsule 40 mg  40 mg Oral Daily [name], MD   40 mg at 04/15/20 1026  . haloperidol (HALDOL) tablet 2 mg  2 mg Oral Q6H PRN [name], [name], MD   2 mg at 04/12/20 2133   Or  . haloperidol lactate (HALDOL) injection 1 mg  1 mg Intravenous Q6H PRN [name], [name], MD      . haloperidol (HALDOL) tablet 3 mg  3 mg Oral QHS [name], MD      . hydrALAZINE (APRESOLINE) tablet 50 mg  50 mg Oral Q6H PRN [name], [name], MD      . lamoTRIgine (LAMICTAL) tablet 150 mg  150 mg Oral BID [name], [name], MD   150 mg at 04/15/20 1026  . levETIRAcetam (KEPPRA) tablet 1,000 mg  1,000 mg Oral BID  [name], [name], MD   1,000 mg at 04/15/20 1026  . magnesium hydroxide (MILK OF MAGNESIA) suspension 30 mL  30 mL Oral Daily PRN [name], [name], MD      . OLANZapine (ZYPREXA) injection 10 mg  10 mg Intramuscular Q6H PRN [name], [name], MD      . OLANZapine zydis (ZYPREXA) disintegrating tablet 12.5 mg  12.5 mg Oral QHS [name], MD   12.5 mg at 04/14/20 2058  . ondansetron (ZOFRAN) tablet 4 mg  4 mg Oral Q6H PRN [name], [name], MD       Or  . ondansetron (ZOFRAN) injection 4 mg  4 mg Intravenous Q6H PRN [name], [name], MD      . thiamine tablet 100 mg  100 mg Oral Daily [name], [name], MD   100 mg at 04/15/20 1025    Lab Results: No results found for this or any previous visit (from the past 48 hour(s)).  Blood Alcohol level:  Lab Results  Component Value Date   ETH <10 11/03/2019   ETH <5 12/05/2016    Metabolic Disorder Labs: No  results found for: HGBA1C, MPG No results found for: PROLACTIN No results found for: CHOL, TRIG, HDL, CHOLHDL, VLDL, LDLCALC   Past Psychiatric History:   Past Medical/Surgical History  Past Medical History:  Diagnosis Date  . Seizures (HCC)     Past Surgical History:  Procedure Laterality Date  . SKIN GRAFT      Family Medical and Psychiatric History:   Family History  Problem Relation Age of Onset  . Seizures Mother     Social History:   Social History   Substance and Sexual Activity  Alcohol Use Yes  . Alcohol/week: 2.0 standard drinks  . Types: 2 Shots of liquor per week   Comment: "rarely"      Social History   Substance and Sexual Activity  Drug Use No    Social History   Socioeconomic History  . Marital status: Single    Spouse name: Not on file  . Number of children: Not on file  . Years of education: Not on file  . Highest education level: Not on file  Occupational History  . Not on file  Tobacco Use  . Smoking status: Current Some Day Smoker  . Smokeless tobacco: Current User  Vaping Use  . Vaping Use: Never used  Substance and Sexual Activity  . Alcohol use: Yes    Alcohol/week: 2.0 standard drinks    Types: 2 Shots of liquor per week    Comment: "rarely"   . Drug use: No  . Sexual activity: Yes  Other Topics Concern  . Not on file  Social History Narrative  . Not on file   Social Determinants of Health   Financial Resource Strain:   . Difficulty of Paying Living Expenses:   Food Insecurity:   . Worried About Programme researcher, broadcasting/film/video in the Last Year:   . Barista in the Last Year:   Transportation Needs:   . Freight forwarder (Medical):   [name] Lack of Transportation (Non-Medical):   Physical Activity:   . Days of Exercise per Week:   . Minutes of Exercise per Session:   Stress:   . Feeling of Stress :   Social Connections:   . Frequency of Communication with Friends and Family:   . Frequency of Social Gatherings with  Friends and Family:   . Attends Religious Services:   .  Active Member of Clubs or Organizations:   . Attends Banker Meetings:   [name] Marital Status:                             Summary:    Basically awaits NH placement hopefully on Court ordered ER petition for guardianship   Needs PT clearance as well   Continues same regimien on unit needing  1:1     Prescription Plan:  Same   Estimated Length of Stay:  Not known    [name], MD 04/15/2020, 1:00 PM

## 2020-04-15 NOTE — Plan of Care (Signed)
  Problem: Education: Goal: Emotional status will improve Outcome: Not Progressing Goal: Mental status will improve Outcome: Not Progressing Goal: Verbalization of understanding the information provided will improve Outcome: Not Progressing   Problem: Health Behavior/Discharge Planning: Goal: Compliance with treatment plan for underlying cause of condition will improve Outcome: Not Progressing   Problem: Safety: Goal: Periods of time without injury will increase Outcome: Not Progressing

## 2020-04-15 NOTE — Plan of Care (Signed)
Pt denies depression, anxiety, SI, HI and AVH. Pt was educated on care plan and verbalizes understanding. Enslee Bibbins RN Problem: Education: Goal: Knowledge of [facility] Education information/materials will improve Outcome: Progressing Goal: Emotional status will improve Outcome: Progressing Goal: Mental status will improve Outcome: Progressing Goal: Verbalization of understanding the information provided will improve Outcome: Progressing   Problem: Health Behavior/Discharge Planning: Goal: Identification of resources available to assist in meeting health care needs will improve Outcome: Progressing Goal: Compliance with treatment plan for underlying cause of condition will improve Outcome: Progressing   Problem: Physical Regulation: Goal: Ability to maintain clinical measurements within normal limits will improve Outcome: Progressing   Problem: Safety: Goal: Periods of time without injury will increase Outcome: Progressing   Problem: Education: Goal: Verbalization of understanding the information provided will improve Outcome: Progressing   Problem: Coping: Goal: Ability to demonstrate appropriate behavior when angry will improve Outcome: Progressing Goal: Ability to identify and develop effective coping behavior will improve Outcome: Progressing Goal: Ability to interact with others will improve Outcome: Progressing   

## 2020-04-15 NOTE — BHH Counselor (Signed)
CSW has reached out to [place] for assistance with charity home health. At the time of this update no response as of yet.  Penni Homans, MSW, LCSW 04/15/2020 3:23 PM

## 2020-04-16 NOTE — Plan of Care (Signed)
  Problem: Group Participation Goal: STG - Patient will engage in groups without prompting or encouragement from LRT x3 group sessions within 5 recreation therapy group sessions Description: STG - Patient will engage in groups without prompting or encouragement from LRT x3 group sessions within 5 recreation therapy group sessions Outcome: Not Progressing   

## 2020-04-16 NOTE — BHH Counselor (Signed)
CSW spoke with the patients sister [name], per sister the patient was not cooking in the home but eating microwave meals provided by the family.  She also reports that patient was behind on bills and their other sister had to take the patient to [employer] to figure out how much money he has and get organized with paying bills.  Sister reports pt has a car that he has not driven in years and has become delinquent on payments and she plans on contacting the loan office to have the car repo-ed.  She reports that she will call CSW Darren as soon as she is out of the court meeting on 04/19/2020.  Penni Homans, MSW, LCSW 04/16/2020 3:29 PM

## 2020-04-16 NOTE — BHH Counselor (Signed)
CSW staffed with acting supervisor.  Per supervisor no APS report warranted at this time due to family involvement.  Per supervisor follow up on Monday and depending on family report of events with lawyer, report may need to be made then.  Penni Homans, MSW, LCSW 04/16/2020 2:50 PM

## 2020-04-16 NOTE — Progress Notes (Signed)
Pt is alert and oriented to his name, very tired this AM when this writer woke pt, he complained that he wanted to go back to sleep, was irritable. Pt makes no gestures to harm self, is noted to be sleeping most of the morning. Pt remains with a one to one with staff sitter for safety. Pt did not eat breakfast, preferred to sleep. Encouraged PO fluids. No distress noted, none reported, pt voices no complaints. Will continue to monitor pt per Q15 minute face checks, on a 1:1 with staff sitter, and will continue to monitor pt for safety and fall risk.   Pt is a high fall risk, does not follow directions easily, will impulsively attempt to get out of bed without warning, requires staff to remind pt to toilet on a regular basis with assist to use a urinal, has generalized weakness, requires staff assist to ambulate with walker and staff assist to transfer from bed to walker, and from walker to toilet to have a bowel movement. Pt also requires gait belt for staff to assist with ambulation.

## 2020-04-16 NOTE — BHH Group Notes (Signed)
[name]:  (Nursing/MHT/Case Management/Adjunct)  Date:  04/16/2020  Time:  10:22 PM  Type of Therapy:  Group Therapy  Participation Level:  Active  Participation Quality:  Attentive  Affect:  Appropriate  Cognitive:  Disorganized  Insight:  Improving and Limited  Engagement in Group:  Improving and Limited  Modes of Intervention:  Goals/Wrap up group  Summary of Progress/Problems: MHT conducted goals and wrap up group encouraging patient to participate in setting goals daily in order increase self-fulfillment. Patient stated that his goal for the day is to increase his laring even spelling the word out in order for staff and other patients to support him in his response. Patient agrees that he met this goal for the day and is going to continue working towards this goal even outside of the facility. Patient also expresses at this point and time he is experiencing no pain mentally or physically.  Maylene Roes 04/16/2020, 10:22 PM

## 2020-04-16 NOTE — Plan of Care (Signed)
Patient remains 1:1 with sitter at bedside. He denies SI,HI and AVH. Patient still requires some redirection to stay on task although. Visible in the milieu watching tv with peers during the evening.Compliant with medications.Support and encouragement given. He appears to be in bed resting at this time.

## 2020-04-16 NOTE — BHH Counselor (Signed)
CSW has reached out to [facility] with [place]. Per message received patient is not appropriate for Home Health.   Penni Homans, MSW, LCSW 04/16/2020 8:50 AM

## 2020-04-16 NOTE — Progress Notes (Signed)
Patient ID: Lance Ray, male   DOB: [birth year], 49 y.o.   MRN: [id number]   Brief Progress Note Rama Candise Bowens MD  Patient remains on 1:1  His ADL's still need supervision In PT he is gaining strength No other new medical problems currently  No observed Seizures  Brief MS  Alert cooperative oriented to person Makes errors with short term and recent and remote memory  Judgement insight reliability poor  Does not meet capacity for consent No active SI HI or plans Consciousness not clouded or fluctuant Speech remains in same tone volume fluency Thought process and content ---does not make sense ---illogical disorganized ---   A/P mainly awaits disposition PT clearance for NH transfer pending State ER funding via family court appeal and for ER guardianshp

## 2020-04-17 NOTE — Plan of Care (Signed)
Pt denies depression, anxiety, pain, SI, HI and AVH but says "I feel terrible". Pt was educated on care plan and verbalizes understanding. Lance Mayers RN Problem: Education: Goal: Knowledge of [facility] Education information/materials will improve Outcome: Progressing Goal: Emotional status will improve Outcome: Progressing Goal: Mental status will improve Outcome: Progressing Goal: Verbalization of understanding the information provided will improve Outcome: Progressing   Problem: Health Behavior/Discharge Planning: Goal: Identification of resources available to assist in meeting health care needs will improve Outcome: Progressing Goal: Compliance with treatment plan for underlying cause of condition will improve Outcome: Progressing   Problem: Physical Regulation: Goal: Ability to maintain clinical measurements within normal limits will improve Outcome: Progressing   Problem: Safety: Goal: Periods of time without injury will increase Outcome: Progressing   Problem: Education: Goal: Verbalization of understanding the information provided will improve Outcome: Progressing   Problem: Coping: Goal: Ability to demonstrate appropriate behavior when angry will improve Outcome: Progressing Goal: Ability to identify and develop effective coping behavior will improve Outcome: Progressing Goal: Ability to interact with others will improve Outcome: Progressing

## 2020-04-17 NOTE — Progress Notes (Signed)
Patient ID: Lance Ray, male   DOB: [birth year], 49 y.o.   MRN: [id number]    [facility] Behavioral Health MD Progress Note Huey Romans, MD    Referring Physician:   04/17/2020 3:19 PM Patient Identification: Lance Ray MRN:  [id number]  Reason for Consult:         Medical Diagnosis:  Principal Problem:   Dementia [facility]) Active Problems:   Recurrent seizures (HCC)   Psychosis (HCC)   Failure to thrive in adult   Vitals: Blood pressure (!) 132/94, pulse 96, temperature 97.8 F (36.6 C), temperature source Oral, resp. rate 16, height 5\' 9"  (1.753 m), weight 62.6 kg, SpO2 100 %.Body mass index is 20.38 kg/m.   Psychiatric Diagnosis: Dementia (HCC)    DAILY NARRATIVE  Did daily rounds and treatment team   He continues in about the same condition         Pertinent Psychiatric Protocols and Assessments: AIMS:  , ,  ,  ,       CIWA:      COWS:     PHYSICAL EXAM  Physical Exam  SYSTEMS REVIEW  Review of Systems  MENTAL STATUS: Pertinents Only   Haggard thin frail sickly looking but grooming better Remains on 1:1 Only oriented to name Speech --mumbles and is garbled Thought process and content --illogical disorganized, --does not make sense --at times word salad  Unclear safety margin Memory remote recent immediate impaired Judgement insight reliability all poor  Fund of knowledge and intelligence cognition declining  Conscioussness not clouded or fluctuant Concentration and attention poor  Does not meet capacity for consent  He cannot say why he is hosptialized, what his problems are and what would happen if treated or not treated.                        ADL's   1:1   Since he is not capable   Sleep  Generally okay    Cognition  About the same continues to decline   Appetite  Fair   Movements  PT is helping him with ambulation and muscle strengthening    Psychomotor  Activity:  Slow   strength & muscle tone:  PT assisting  gait & station: very limited fall risk            Current Medications: Current Facility-Administered Medications  Medication Dose Route Frequency Provider Last Rate Last Admin  . acetaminophen (TYLENOL) tablet 650 mg  650 mg Oral Q6H PRN [name], , MD   650 mg at 04/15/20 1850  . alum & mag hydroxide-simeth (MAALOX/MYLANTA) 200-200-20 MG/5ML suspension 30 mL  30 mL Oral Q4H PRN [name], [name], MD      . benztropine (COGENTIN) tablet 1 mg  1 mg Oral BID [name], 04-03-2001, MD   1 mg at 04/17/20 1045  . clonazePAM (KLONOPIN) disintegrating tablet 0.5 mg  0.5 mg Oral BID 04/19/20, MD   0.5 mg at 04/17/20 1048  . DULoxetine (CYMBALTA) DR capsule 40 mg  40 mg Oral Daily 04/19/20, MD   40 mg at 04/17/20 1046  . haloperidol (HALDOL) tablet 2 mg  2 mg Oral Q6H PRN [name], 04/19/20, MD   2 mg at 04/12/20 2133   Or  . haloperidol lactate (HALDOL) injection 1 mg  1 mg Intravenous Q6H PRN [name], 2134, MD   1 mg at 04/17/20 0110  . haloperidol (HALDOL) tablet 3 mg  3 mg Oral QHS [name], MD   3 mg at 04/16/20 2201  . hydrALAZINE (APRESOLINE) tablet 50 mg  50 mg Oral Q6H PRN [name], [name], MD      . lamoTRIgine (LAMICTAL) tablet 150 mg  150 mg Oral BID [name], [name], MD   150 mg at 04/17/20 1044  . levETIRAcetam (KEPPRA) tablet 1,000 mg  1,000 mg Oral BID [name], [name], MD   1,000 mg at 04/17/20 1045  . magnesium hydroxide (MILK OF MAGNESIA) suspension 30 mL  30 mL Oral Daily PRN [name], [name], MD      . OLANZapine (ZYPREXA) injection 10 mg  10 mg Intramuscular Q6H PRN [name], [name], MD   10 mg at 04/17/20 0110  . OLANZapine zydis (ZYPREXA) disintegrating tablet 12.5 mg  12.5 mg Oral QHS [name], MD   12.5 mg at 04/16/20 2201  . ondansetron (ZOFRAN) tablet 4 mg  4 mg Oral Q6H PRN [name], [name], MD       Or  . ondansetron (ZOFRAN) injection 4 mg  4 mg Intravenous Q6H PRN [name], [name], MD       . thiamine tablet 100 mg  100 mg Oral Daily [name], [name], MD   100 mg at 04/17/20 1044    Lab Results: No results found for this or any previous visit (from the past 48 hour(s)).  Blood Alcohol level:  Lab Results  Component Value Date   ETH <10 11/03/2019   ETH <5 12/05/2016    Metabolic Disorder Labs: No results found for: HGBA1C, MPG No results found for: PROLACTIN No results found for: CHOL, TRIG, HDL, CHOLHDL, VLDL, LDLCALC   Past Psychiatric History:   Past Medical/Surgical History  Past Medical History:  Diagnosis Date  . Seizures (HCC)     Past Surgical History:  Procedure Laterality Date  . SKIN GRAFT      Family Medical and Psychiatric History:   Family History  Problem Relation Age of Onset  . Seizures Mother     Social History "Additional/Addendum":   Sister continues to work to get him to [facility] with [facility] ER funding and placement and guardianship   Social History   Substance and Sexual Activity  Alcohol Use Yes  . Alcohol/week: 2.0 standard drinks  . Types: 2 Shots of liquor per week   Comment: "rarely"      Social History   Substance and Sexual Activity  Drug Use No    Social History   Socioeconomic History  . Marital status: Single    Spouse name: Not on file  . Number of children: Not on file  . Years of education: Not on file  . Highest education level: Not on file  Occupational History  . Not on file  Tobacco Use  . Smoking status: Current Some Day Smoker  . Smokeless tobacco: Current User  Vaping Use  . Vaping Use: Never used  Substance and Sexual Activity  . Alcohol use: Yes    Alcohol/week: 2.0 standard drinks    Types: 2 Shots of liquor per week    Comment: "rarely"   . Drug use: No  . Sexual activity: Yes  Other Topics Concern  . Not on file  Social History Narrative  . Not on file   Social Determinants of Health   Financial Resource Strain:   . Difficulty of Paying Living Expenses:   Food Insecurity:    . Worried About Programme researcher, broadcasting/film/video in the  Last Year:   . Ran Out of Food in the Last Year:   Transportation Needs:   . Freight forwarder (Medical):   [name] Lack of Transportation (Non-Medical):   Physical Activity:   . Days of Exercise per Week:   . Minutes of Exercise per Session:   Stress:   . Feeling of Stress :   Social Connections:   . Frequency of Communication with Friends and Family:   . Frequency of Social Gatherings with Friends and Family:   . Attends Religious Services:   . Active Member of Clubs or Organizations:   . Attends Banker Meetings:   [name] Marital Status:                            Summary:    Patient PT is better but MS about the same Does not meet capacity for consent  Due to multiple factors already discussed in many notes        Prescription Plan/Interventions:    Same meds and all for now  1:1   PT continues   Sister working to get ER funding and guardianship for NH        Estimated Length of Stay:  --not known   Awaits NH placement directly    [name], MD 04/17/2020, 3:19 PM  Total Time spent with patient: 30 minutes

## 2020-04-17 NOTE — BHH Group Notes (Signed)
LCSW Group Therapy 04/17/20 2:15-2:50 PM   Type of Therapy and Topic:  Group Therapy:  Setting Goals   Participation Level:  Minimal    Description of Group: In this process group, patients discussed using strengths to work toward goals and address challenges.  Patients identified two positive things about themselves and one goal they were working on.  Patients were given the opportunity to share openly and support each other's plan for self-empowerment.  The group discussed the value of gratitude and were encouraged to have a daily reflection of positive characteristics or circumstances.  Patients were encouraged to identify a plan to utilize their strengths to work on current challenges and goals.   Therapeutic Goals 1. Patient will verbalize personal strengths/positive qualities and relate how these can assist with achieving desired personal goals 2. Patients will verbalize affirmation of peers plans for personal change and goal setting 3. Patients will explore the value of gratitude and positive focus as related to successful achievement of goals 4. Patients will verbalize a plan for regular reinforcement of personal positive qualities and circumstances.   Summary of Patient Progress: Patient checked into group feeling so-so. Patient spoke a few times during group but CSW could not understand what patient was saying.        Therapeutic Modalities Cognitive Behavioral Therapy Motivational Interviewing

## 2020-04-17 NOTE — Plan of Care (Signed)
  Problem: Education: Goal: Knowledge of [facility] Education information/materials will improve Outcome: Progressing Goal: Emotional status will improve Outcome: Progressing Goal: Mental status will improve Outcome: Progressing Goal: Verbalization of understanding the information provided will improve Outcome: Progressing   Problem: Health Behavior/Discharge Planning: Goal: Identification of resources available to assist in meeting health care needs will improve Outcome: Progressing Goal: Compliance with treatment plan for underlying cause of condition will improve Outcome: Progressing   Problem: Physical Regulation: Goal: Ability to maintain clinical measurements within normal limits will improve Outcome: Progressing   Problem: Safety: Goal: Periods of time without injury will increase Outcome: Progressing   Problem: Education: Goal: Verbalization of understanding the information provided will improve Outcome: Progressing   Problem: Coping: Goal: Ability to demonstrate appropriate behavior when angry will improve Outcome: Progressing Goal: Ability to identify and develop effective coping behavior will improve Outcome: Progressing Goal: Ability to interact with others will improve Outcome: Progressing   

## 2020-04-17 NOTE — Progress Notes (Addendum)
D- Patient alert and oriented. Affect/mood is apprehensive and cooperative. Pt denies SI, HI, AVH, and pain.   A- Scheduled medications administered to patient, per MD orders. Support and encouragement provided.  Routine safety checks conducted every 15 minutes.  Patient informed to notify staff with problems or concerns. Pt has 1:1 sitter. Pt is impulsive and a high fall risk.   R- No adverse drug reactions noted. Patient contracts for safety at this time. Patient compliant with medications and treatment plan. Patient receptive, calm, and cooperative. Patient interacts well with others on the unit.  Patient remains safe at this time.  Torrie Mayers RN

## 2020-04-17 NOTE — Progress Notes (Signed)
Physical Therapy Treatment Patient Details Name: Lance Ray MRN: [id number] DOB: 1971/03/11 Today's Date: 04/17/2020    History of Present Illness [name] was admitted on behavior health unit on 7/16 after being on medical unit for seizures x 2 weeks. Pt with recent diagnosis of dementia. Pt seen by PT on medical unit, now evaluated on BH. PMH includes epilepsy and depression.    PT Comments    Pt in wheelchair in day room with staff and other patients present. Pt wheeled to his room for PT to be performed in less distractive area. Pt initially declined treatment however participatory with increased encouragement. Pt performed ambulation with handheld mod A for steadying and demonstrated bouts of improved control of ataxic movements. Therex performed in standing and sitting - verbal cues for improved technique and to slow down speed for improved control. Pt able to follow single step commands consistently and remained calm throughout session. Pt thinking about lunch trays coming and after sitting in wheelchair, was done with therapy for the morning and reported not wanting to do any more. Pt overall making good progress with therapy however continues to be limited due to decreased safety awareness and decreased motor control. Will continue to follow pt this acute stay and progress pt as tolerated.    Follow Up Recommendations  SNF;Supervision/Assistance - 24 hour     Equipment Recommendations  Rolling walker with 5" wheels    Recommendations for Other Services       Precautions / Restrictions Precautions Precautions: Fall Restrictions Weight Bearing Restrictions: No    Mobility  Bed Mobility               General bed mobility comments: not performed as pt received in wheelchair in day room  Transfers Overall transfer level: Needs assistance Equipment used: 1 person hand held assist Transfers: Sit to/from Stand Sit to Stand: Min assist         General  transfer comment: sit to stand from bed and wheelchair required min A for steadying while coming into standing; stand to sit with CGA for control on descent  Ambulation/Gait Ambulation/Gait assistance: Mod assist Gait Distance (Feet): 140 Feet Assistive device: 1 person hand held assist Gait Pattern/deviations: Step-through pattern;Steppage;Narrow base of support Gait velocity: somewhat normal   General Gait Details: mod A for steadying as pt ambulated in hall[place] with L handheld assist; pt with narrow BOS throughout distance with step through gait pattern; pt with occasional steppage gait requiring increased verbal cues to cease; pt with poor control of placing feet on floor hitting hard with heels on initial contact   Stairs             Wheelchair Mobility    Modified Rankin (Stroke Patients Only)       Balance Overall balance assessment: Needs assistance Sitting-balance support: Feet supported Sitting balance-Leahy Scale: Fair Sitting balance - Comments: pt with fair balance requiring SBA for impulsivity of movements   Standing balance support: Single extremity supported Standing balance-Leahy Scale: Fair Standing balance comment: CGA to min A for static standing balance and min-mod A for dynamic balance activities                             Cognition Arousal/Alertness: Awake/alert Behavior During Therapy: Flat affect;Anxious Overall Cognitive Status: Difficult to assess Area of Impairment: Following commands;Safety/judgement;Awareness;Problem solving  Current Attention Level: Alternating;Focused   Following Commands: Follows one step commands consistently;Follows one step commands with increased time;Follows multi-step commands inconsistently;Follows multi-step commands with increased time Safety/Judgement: Decreased awareness of safety;Decreased awareness of deficits Awareness: Intellectual Problem Solving: Slow  processing;Difficulty sequencing;Requires verbal cues;Requires tactile cues        Exercises Other Exercises Other Exercises: seated marches x 15 bilat, standing hip abd x 10 bilat, bilat simultaneous LAQ x 10    General Comments        Pertinent Vitals/Pain Pain Assessment: No/denies pain    Home Living                      Prior Function            PT Goals (current goals can now be found in the care plan section) Acute Rehab PT Goals Patient Stated Goal: none stated PT Goal Formulation: Patient unable to participate in goal setting Time For Goal Achievement: 04/24/20 Potential to Achieve Goals: Fair Progress towards PT goals: Progressing toward goals    Frequency    Min 2X/week      PT Plan Current plan remains appropriate    Co-evaluation              AM-PAC PT "6 Clicks" Mobility   Outcome Measure  Help needed turning from your back to your side while in a flat bed without using bedrails?: A Little Help needed moving from lying on your back to sitting on the side of a flat bed without using bedrails?: A Little Help needed moving to and from a bed to a chair (including a wheelchair)?: A Little Help needed standing up from a chair using your arms (e.g., wheelchair or bedside chair)?: A Little Help needed to walk in hospital room?: A Lot Help needed climbing 3-5 steps with a railing? : A Lot 6 Click Score: 16    End of Session Equipment Utilized During Treatment: Gait belt Activity Tolerance: Patient tolerated treatment well Patient left: in chair;with nursing/sitter in room (in wheelchair with wheels locked) Nurse Communication: Mobility status PT Visit Diagnosis: Unsteadiness on feet (R26.81);Other abnormalities of gait and mobility (R26.89);Muscle weakness (generalized) (M62.81)     Time: 3662-9476 PT Time Calculation (min) (ACUTE ONLY): 15 min  Charges:                        Frederich Chick, SPT   Frederich Chick 04/17/2020, 11:44  AM

## 2020-04-17 NOTE — Tx Team (Signed)
Interdisciplinary Treatment and Diagnostic Plan Update 04/17/2020 Time of Session: 12:55 PM Lance Ray MRN: [id number]  Principal Diagnosis: Dementia [facility])  Secondary Diagnoses: Principal Problem:   Dementia (HCC) Active Problems:   Recurrent seizures (HCC)   Psychosis (HCC)   Failure to thrive in adult   Current Medications:           Current Facility-Administered Medications  Medication Dose Route Frequency Provider Last Rate Last Admin  . acetaminophen (TYLENOL) tablet 650 mg  650 mg Oral Q6H PRN [name], [name], MD      . alum & mag hydroxide-simeth (MAALOX/MYLANTA) 200-200-20 MG/5ML suspension 30 mL  30 mL Oral Q4H PRN [name], [name], MD      . benztropine (COGENTIN) tablet 1 mg  1 mg Oral BID [name], [name], MD   1 mg at 04/11/20 1053  . clonazePAM (KLONOPIN) disintegrating tablet 0.5 mg  0.5 mg Oral BID [name], MD   0.5 mg at 04/11/20 1054  . DULoxetine (CYMBALTA) DR capsule 40 mg  40 mg Oral Daily [name], MD   40 mg at 04/11/20 1053  . haloperidol (HALDOL) tablet 0.5 mg  0.5 mg Oral BID [name], [name], MD   0.5 mg at 04/11/20 1055  . haloperidol (HALDOL) tablet 2 mg  2 mg Oral Q6H PRN [name], [name], MD       Or  . haloperidol lactate (HALDOL) injection 1 mg  1 mg Intravenous Q6H PRN [name], [name], MD      . hydrALAZINE (APRESOLINE) tablet 50 mg  50 mg Oral Q6H PRN [name], [name], MD      . lamoTRIgine (LAMICTAL) tablet 150 mg  150 mg Oral BID [name], [name], MD   150 mg at 04/11/20 1054  . levETIRAcetam (KEPPRA) tablet 1,000 mg  1,000 mg Oral BID [name], [name], MD   1,000 mg at 04/11/20 1054  . magnesium hydroxide (MILK OF MAGNESIA) suspension 30 mL  30 mL Oral Daily PRN [name], [name], MD      . OLANZapine (ZYPREXA) injection 10 mg  10 mg Intramuscular Q6H PRN [name], [name], MD      . OLANZapine zydis (ZYPREXA) disintegrating tablet 12.5 mg  12.5 mg Oral QHS [name], MD   12.5 mg at 04/10/20 2151  . ondansetron (ZOFRAN)  tablet 4 mg  4 mg Oral Q6H PRN [name], [name], MD       Or  . ondansetron (ZOFRAN) injection 4 mg  4 mg Intravenous Q6H PRN [name], [name], MD      . thiamine tablet 100 mg  100 mg Oral Daily [name], [name], MD   100 mg at 04/11/20 1053   PTA Medications:        Medications Prior to Admission  Medication Sig Dispense Refill Last Dose  . benztropine (COGENTIN) 1 MG tablet Take 1 tablet (1 mg total) by mouth 2 (two) times daily. 60 tablet 0   . clonazePAM (KLONOPIN) 0.25 MG disintegrating tablet Take 1 tablet (0.25 mg total) by mouth 2 (two) times daily. 60 tablet 0   . DULoxetine (CYMBALTA) 30 MG capsule Take 1 capsule (30 mg total) by mouth daily.  3   . haloperidol (HALDOL) 0.5 MG tablet Take 0.5 tablets (0.25 mg total) by mouth 2 (two) times daily.     [name] lamoTRIgine (LAMICTAL) 150 MG tablet Take 1 tablet (150 mg total) by mouth 2 (two) times daily.     [name] levETIRAcetam (  KEPPRA) 1000 MG tablet Take 1 tablet (1,000 mg total) by mouth 2 (two) times daily.     [name] OLANZapine zydis (ZYPREXA) 10 MG disintegrating tablet Take 1 tablet (10 mg total) by mouth at bedtime. 30 tablet 0   . thiamine 100 MG tablet Take 1 tablet (100 mg total) by mouth daily.       Patient Stressors: Financial difficulties Health problems Medication change or noncompliance Substance abuse Traumatic event  Patient Strengths: Motivation for treatment/growth Supportive family/friends  Treatment Modalities: Medication Management, Group therapy, Case management,  1 to 1 session with clinician, Psychoeducation, Recreational therapy.   Physician Treatment Plan for Primary Diagnosis: Dementia [facility]) Long Term Goal(s): Improvement in symptoms so as ready for discharge Improvement in symptoms so as ready for discharge   Short Term Goals: Ability to identify and develop effective coping behaviors will improve Ability to maintain clinical measurements within normal limits will improve Compliance  with prescribed medications will improve Ability to demonstrate self-control will improve Compliance with prescribed medications will improve  Medication Management: Evaluate patient's response, side effects, and tolerance of medication regimen.  Therapeutic Interventions: 1 to 1 sessions, Unit Group sessions and Medication administration.  Evaluation of Outcomes: Not Progressing  Physician Treatment Plan for Secondary Diagnosis: Principal Problem:   Dementia (HCC) Active Problems:   Recurrent seizures (HCC)   Psychosis (HCC)   Failure to thrive in adult  Long Term Goal(s): Improvement in symptoms so as ready for discharge Improvement in symptoms so as ready for discharge   Short Term Goals: Ability to identify and develop effective coping behaviors will improve Ability to maintain clinical measurements within normal limits will improve Compliance with prescribed medications will improve Ability to demonstrate self-control will improve Compliance with prescribed medications will improve     Medication Management: Evaluate patient's response, side effects, and tolerance of medication regimen.  Therapeutic Interventions: 1 to 1 sessions, Unit Group sessions and Medication administration.  Evaluation of Outcomes: Not Progressing   RN Treatment Plan for Primary Diagnosis: Dementia (HCC) Long Term Goal(s): Knowledge of disease and therapeutic regimen to maintain health will improve  Short Term Goals: Ability to demonstrate self-control, Ability to participate in decision making will improve and Compliance with prescribed medications will improve  Medication Management: RN will administer medications as ordered by provider, will assess and evaluate patient's response and provide education to patient for prescribed medication. RN will report any adverse and/or side effects to prescribing provider.  Therapeutic Interventions: 1 on 1 counseling sessions, Psychoeducation,  Medication administration, Evaluate responses to treatment, Monitor vital signs and CBGs as ordered, Perform/monitor CIWA, COWS, AIMS and Fall Risk screenings as ordered, Perform wound care treatments as ordered.  Evaluation of Outcomes: Not Progressing   LCSW Treatment Plan for Primary Diagnosis: Dementia [facility]) Long Term Goal(s): Safe transition to appropriate next level of care at discharge, Engage patient in therapeutic group addressing interpersonal concerns.  Short Term Goals: Engage patient in aftercare planning with referrals and resources  Therapeutic Interventions: Assess for all discharge needs, 1 to 1 time with Social worker, Explore available resources and support systems, Assess for adequacy in community support network, Educate family and significant other(s) on suicide prevention, Complete Psychosocial Assessment, Interpersonal group therapy.  Evaluation of Outcomes: Not Progressing   Progress in Treatment: Attending groups: Yes. Participating in groups: Yes. Hard to understand what patient is saying.  Taking medication as prescribed: Yes. Toleration medication: Yes. Family/Significant other contact made: No, will contact:  Patient gave verbal consent to contact  his sister  Patient understands diagnosis: No. Discussing patient identified problems/goals with staff: Yes. Medical problems stabilized or resolved: No. Denies suicidal/homicidal ideation: Yes. Issues/concerns per patient self-inventory: No. Other: Patient was not aware of the date/year. Patient also did not fully understand why he was in the hospital   New problem(s) identified: No, Describe:  None  New Short Term/Long Term Goal(s): medication stabilization, elimination of SI thoughts, development of comprehensive mental wellness plan   Patient Goals:  Patient spoke about his seizures. Patient is still not aware of the date/time of day.   Discharge Plan or Barriers: Patient has still not  transitioned to a nursing home due to funding. Family is working on [employer].  Reason for Continuation of Hospitalization: Other; describe Dementia   Estimated Length of Stay: 5-7 Days   Attendees: Patient: 04/17/2020 4:17 PM  Physician: Dr. Rodney Langton, MD 04/17/2020 4:17 PM  Nursing: Torrie Mayers, RN 04/17/2020 4:17 PM  RN Care Manager: 04/17/2020 4:17 PM  Social Worker:  Sharman Cheek 04/17/2020 4:17 PM  Recreational Therapist:  04/17/2020 4:17 PM  Other:  04/17/2020 4:17 PM  Other:  04/17/2020 4:17 PM  Other: 04/17/2020 4:17 PM    Scribe for Treatment Team: Susa Simmonds, LCSWA 04/17/2020 4:17 PM

## 2020-04-17 NOTE — Plan of Care (Signed)
  Problem: Education: Goal: Knowledge of [facility] Education information/materials will improve Outcome: Not Progressing Goal: Emotional status will improve Outcome: Not Progressing Goal: Mental status will improve Outcome: Not Progressing Goal: Verbalization of understanding the information provided will improve Outcome: Not Progressing   Problem: Health Behavior/Discharge Planning: Goal: Identification of resources available to assist in meeting health care needs will improve Outcome: Not Progressing Goal: Compliance with treatment plan for underlying cause of condition will improve Outcome: Not Progressing   Problem: Physical Regulation: Goal: Ability to maintain clinical measurements within normal limits will improve Outcome: Not Progressing   Problem: Safety: Goal: Periods of time without injury will increase Outcome: Not Progressing   Problem: Education: Goal: Verbalization of understanding the information provided will improve Outcome: Not Progressing   Problem: Coping: Goal: Ability to demonstrate appropriate behavior when angry will improve Outcome: Not Progressing Goal: Ability to identify and develop effective coping behavior will improve Outcome: Not Progressing Goal: Ability to interact with others will improve Outcome: Not Progressing   

## 2020-04-17 NOTE — Progress Notes (Signed)
Pt continues to have a 1:1. Pt is impulsive and a moderate assist Pt is safe. Torrie Mayers RN

## 2020-04-17 NOTE — Progress Notes (Signed)
Patient is agitated and continues to try and get out of the bed. He is irritated with staff and was observed calling staff racist comments. He was administered injection to help with agitation.  He remains safe with both 1:1 observation and 15 minute safety checks.

## 2020-04-17 NOTE — Progress Notes (Signed)
Patient is in bed alseep. Non labored breathing and no distress noted. No meds given up to this time due to patient being asleep. Patient was calm and cooperative earlier in the day attending group and participating in outdoor activity. He remains safe on the unit with both 1:1 observation and 15 minute safety checks.     Cleo Butler-Nicholson, LPN

## 2020-04-18 NOTE — [facility] Group Notes (Signed)
[facility] LCSW Group Therapy Note  04/18/20 12:50 PM - 1:28 PM  Type of Therapy and Topic:  Group Therapy:  Adding Supports Including Being Your Own Support  Participation Level:  Active  Description of Group:  Patients in this group were introduced to the concept that additional supports including self-support are an essential part of recovery.  A song entitled "I Need Help!" was played and a group discussion was held in reaction to the idea of needing to add supports.  A song entitled "My Own Hero" was played and a group discussion ensued in which patients stated they could relate to the song and it inspired them to realize they have be willing to help themselves in order to succeed, because other people cannot achieve sobriety or stability for them.  We discussed adding a variety of healthy supports to address the various needs in their lives.  A song was played called "I Know Where I've Been" toward the end of group and used to conduct an inspirational wrap-up to group of remembering how far they have already come in their journey.  Therapeutic Goals: 1)  demonstrate the importance of being a part of one's own support system 2)  discuss reasons people in one's life may eventually be unable to be continually supportive  3)  identify the patient's current support system and explore how support has been impacted with COVID19  4)  elicit commitments to add healthy supports and to become more conscious of being self-supportive   Summary of Patient Progress:  The patient expressed his sisters are supports. Patient was talkative during group but was difficult to understand. CSW was able to understand pt was talking about his time working with [employer], patient talked about death and being ready to die but not suicidal. Patient was often off topic with what was understandable from group.   Therapeutic Modalities:   Motivational Interviewing Music Therapy   Raeanne Gathers, [place]

## 2020-04-18 NOTE — Progress Notes (Addendum)
[facility] MD Progress Note  04/18/2020 3:38 PM Lance Ray  MRN:  [id number] Subjective:    I can go home soon  Principal Problem: Dementia (HCC) Diagnosis: Principal Problem:   Dementia (HCC) Active Problems:   Recurrent seizures (HCC)   Psychosis (HCC)   Failure to thrive in adult  Total Time spent with patient:  20-25 min     Past Psychiatric History: ---  Already ---discussed     Past Medical History:  Past Medical History:  Diagnosis Date  . Seizures (HCC)     Past Surgical History:  Procedure Laterality Date  . SKIN GRAFT     Family History:  Family History  Problem Relation Age of Onset  . Seizures Mother    Family Psychiatric  History:  Social History:  Social History   Substance and Sexual Activity  Alcohol Use Yes  . Alcohol/week: 2.0 standard drinks  . Types: 2 Shots of liquor per week   Comment: "rarely"      Social History   Substance and Sexual Activity  Drug Use No    Social History   Socioeconomic History  . Marital status: Single    Spouse name: Not on file  . Number of children: Not on file  . Years of education: Not on file  . Highest education level: Not on file  Occupational History  . Not on file  Tobacco Use  . Smoking status: Current Some Day Smoker  . Smokeless tobacco: Current User  Vaping Use  . Vaping Use: Never used  Substance and Sexual Activity  . Alcohol use: Yes    Alcohol/week: 2.0 standard drinks    Types: 2 Shots of liquor per week    Comment: "rarely"   . Drug use: No  . Sexual activity: Yes  Other Topics Concern  . Not on file  Social History Narrative  . Not on file   Social Determinants of Health   Financial Resource Strain:   . Difficulty of Paying Living Expenses:   Food Insecurity:   . Worried About Programme researcher, broadcasting/film/video in the Last Year:   . Barista in the Last Year:   Transportation Needs:   . Freight forwarder (Medical):   [name] Lack of Transportation (Non-Medical):   Physical  Activity:   . Days of Exercise per Week:   . Minutes of Exercise per Session:   Stress:   . Feeling of Stress :   Social Connections:   . Frequency of Communication with Friends and Family:   . Frequency of Social Gatherings with Friends and Family:   . Attends Religious Services:   . Active Member of Clubs or Organizations:   . Attends Banker Meetings:   [name] Marital Status:    Additional Social History:       about the same, looks somewhat healthier gaining strength but MS is still same         Remains on one to one           Sleep: okay   Appetite:  Okay   Current Medications: Current Facility-Administered Medications  Medication Dose Route Frequency Provider Last Rate Last Admin  . acetaminophen (TYLENOL) tablet 650 mg  650 mg Oral Q6H PRN [name], [name], MD   650 mg at 04/15/20 1850  . alum & mag hydroxide-simeth (MAALOX/MYLANTA) 200-200-20 MG/5ML suspension 30 mL  30 mL Oral Q4H PRN [name], [name], MD      . benztropine (  COGENTIN) tablet 1 mg  1 mg Oral BID [name], [name], MD   1 mg at 04/18/20 [id number]  . clonazePAM (KLONOPIN) disintegrating tablet 0.5 mg  0.5 mg Oral BID [name], MD   0.5 mg at 04/18/20 0160  . DULoxetine (CYMBALTA) DR capsule 40 mg  40 mg Oral Daily [name], MD   40 mg at 04/18/20 1093  . haloperidol (HALDOL) tablet 2 mg  2 mg Oral Q6H PRN [name], [name], MD   2 mg at 04/12/20 2133   Or  . haloperidol lactate (HALDOL) injection 1 mg  1 mg Intravenous Q6H PRN [name], [name], MD   1 mg at 04/17/20 0110  . haloperidol (HALDOL) tablet 3 mg  3 mg Oral QHS [name], MD   3 mg at 04/16/20 2201  . hydrALAZINE (APRESOLINE) tablet 50 mg  50 mg Oral Q6H PRN [name], [name], MD      . lamoTRIgine (LAMICTAL) tablet 150 mg  150 mg Oral BID [name], [name], MD   150 mg at 04/18/20 2355  . levETIRAcetam (KEPPRA) tablet 1,000 mg  1,000 mg Oral BID [name], [name], MD   1,000 mg at 04/18/20 7322  . magnesium hydroxide (MILK OF  MAGNESIA) suspension 30 mL  30 mL Oral Daily PRN [name], [name], MD      . OLANZapine (ZYPREXA) injection 10 mg  10 mg Intramuscular Q6H PRN [name], [name], MD   10 mg at 04/17/20 0110  . OLANZapine zydis (ZYPREXA) disintegrating tablet 12.5 mg  12.5 mg Oral QHS [name], MD   12.5 mg at 04/16/20 2201  . ondansetron (ZOFRAN) tablet 4 mg  4 mg Oral Q6H PRN [name], [name], MD       Or  . ondansetron (ZOFRAN) injection 4 mg  4 mg Intravenous Q6H PRN [name], [name], MD      . thiamine tablet 100 mg  100 mg Oral Daily [name], [name], MD   100 mg at 04/18/20 0254    Lab Results: No results found for this or any previous visit (from the past 48 hour(s)).  Blood Alcohol level:  Lab Results  Component Value Date   ETH <10 11/03/2019   ETH <5 12/05/2016    Metabolic Disorder Labs: No results found for: HGBA1C, MPG No results found for: PROLACTIN No results found for: CHOL, TRIG, HDL, CHOLHDL, VLDL, LDLCALC  Physical Findings: AIMS:  , ,  ,  ,    CIWA:    COWS:     Musculoskeletal: Strength & Muscle Tone: somewhat better gradually  Gait & Station:  Environmental consultant and PT stil in place  Patient leans:   Psychiatric Specialty Exam: Physical Exam  Review of Systems  Blood pressure (!) 127/90, pulse (!) 31, temperature 97.8 F (36.6 C), temperature source Oral, resp. rate 18, height 5\' 9"  (1.753 m), weight 62.6 kg, SpO2 (!) 80 %.Body mass index is 20.38 kg/m.      Mental Status  pertinents Only   Continues in same mental state Oriented to name Long term memory still relatively intact Thin gaunt but looks better No new psychosis or mania Still disorganized thought and not making sense  Not clouded or fluctuant Concentration and attention poor  Memory impaired Does not meet capacity for consent   No frank SI or HI  Remains on 1'1 need  Treatment Plan Summary:  Same for now  still awaits NH placement in general   No new seizures or known medical problems   [name], MD 04/18/2020, 3:38 PM

## 2020-04-18 NOTE — Progress Notes (Signed)
Pt continues with a 1:1 sitter for safety. Pt is alert and oriented to person, to year and month, but not to place. Pt is calm, pleasant, at time uncooperative with care of ADLs and with assistance with ambulation. Pt ambulates and transfers with staff assist, and with gait belt with occasional use of wheelchair. Pt is unsteady, high fall risk. Pt is impulsive and does not follow directions regarding ambulating and suddenly getting out of bed without asking for assistance. Pt's short term memory is poor. Pt has poor insight and is not able to identify any triggers for admission. Pt also reports he does not remember being admitted to the hospital or coming to this unit. Pt denies suicidal and homicidal ideation, denies hallucinations, denies feelings of depression and anxiety. Affect is blunted, eye contact is poor. Pt denies pain, is medication compliant, spends time resting quietly in his room, or watching tv in the dayroom, pt's appetite is good, pt reports sleeping well. No distress noted, none reported, pt voices no complaints. Will continue to monitor pt per Q15 minute face checks and monitor for safety and progress.

## 2020-04-19 ENCOUNTER — Encounter: Payer: Self-pay | Admitting: Psychiatry

## 2020-04-19 DIAGNOSIS — F329 Major depressive disorder, single episode, unspecified: Secondary | ICD-10-CM

## 2020-04-19 DIAGNOSIS — F8081 Childhood onset fluency disorder: Secondary | ICD-10-CM

## 2020-04-19 DIAGNOSIS — F32A Depression, unspecified: Secondary | ICD-10-CM

## 2020-04-19 NOTE — Progress Notes (Signed)
Patient slept till lunch time.Compliant with medications.Safety sitter at bedside.

## 2020-04-19 NOTE — Plan of Care (Signed)
  Problem: Education: Goal: Knowledge of  General Education information/materials will improve Outcome: Progressing Goal: Emotional status will improve Outcome: Progressing Goal: Mental status will improve Outcome: Progressing Goal: Verbalization of understanding the information provided will improve Outcome: Progressing   Problem: Health Behavior/Discharge Planning: Goal: Identification of resources available to assist in meeting health care needs will improve Outcome: Progressing Goal: Compliance with treatment plan for underlying cause of condition will improve Outcome: Progressing   Problem: Physical Regulation: Goal: Ability to maintain clinical measurements within normal limits will improve Outcome: Progressing   Problem: Safety: Goal: Periods of time without injury will increase Outcome: Progressing   Problem: Education: Goal: Verbalization of understanding the information provided will improve Outcome: Progressing   Problem: Coping: Goal: Ability to demonstrate appropriate behavior when angry will improve Outcome: Progressing Goal: Ability to identify and develop effective coping behavior will improve Outcome: Progressing Goal: Ability to interact with others will improve Outcome: Progressing   

## 2020-04-19 NOTE — Progress Notes (Signed)
Patient is alert to name and place. He is calm and cooperative. His speech and motor skills continue to improve. He received his prescribed meds and tolerated without incident. He remains safe on the unit with 1:1 observation and 15 minute safety checks.

## 2020-04-19 NOTE — Consult Note (Signed)
[facility] [place] [facility]   PATIENT NAME: Lance Ray    MR#:  [id number]  DATE OF BIRTH:  November 09, 1970  DATE OF ADMISSION:  04/09/2020  PRIMARY CARE PHYSICIAN: Patient, No Pcp Per   REQUESTING/REFERRING PHYSICIAN: Dr [name]  CHIEF COMPLAINT:   Chief Complaint  Patient presents with  . MDD    HISTORY OF PRESENT ILLNESS:  Lance Ray  is a 49 y.o. male with a known history of seizure disorder and dementia.  Medical consult called to review medications.  Patient complains of some stuttering.  Otherwise physically feels okay.  Sometimes he talks very low and is hard to understand.  He is able to follow commands and answers all questions.  PAST MEDICAL HISTORY:   Past Medical History:  Diagnosis Date  . Seizures (HCC)     PAST SURGICAL HISTOIRY:   Past Surgical History:  Procedure Laterality Date  . SKIN GRAFT      SOCIAL HISTORY:   Social History   Tobacco Use  . Smoking status: Current Some Day Smoker  . Smokeless tobacco: Current User  Substance Use Topics  . Alcohol use: Yes    Alcohol/week: 2.0 standard drinks    Types: 2 Shots of liquor per week    Comment: "rarely"     FAMILY HISTORY:   Family History  Problem Relation Age of Onset  . Seizures Mother   . Cerebral aneurysm Father     DRUG ALLERGIES:  No Known Allergies  REVIEW OF SYSTEMS:  CONSTITUTIONAL: No fever, fatigue or weakness.  EYES: Poor vision.  Needs glasses. EARS, NOSE, AND THROAT: No tinnitus or ear pain.  RESPIRATORY: No cough, shortness of breath. CARDIOVASCULAR: No chest pain.  GASTROINTESTINAL: No nausea, vomiting, diarrhea or abdominal pain.  History of constipation GENITOURINARY: No dysuria, hematuria.  ENDOCRINE: No polyuria, nocturia,  HEMATOLOGY: No anemia, easy bruising or bleeding SKIN: No rash or lesion. MUSCULOSKELETAL: No joint pain or arthritis.  Occasional muscle pain. NEUROLOGIC: No tingling, numbness, weakness.   MEDICATIONS AT  HOME:   Prior to Admission medications   Medication Sig Start Date End Date Taking? Authorizing Provider  benztropine (COGENTIN) 1 MG tablet Take 1 tablet (1 mg total) by mouth 2 (two) times daily. 04/06/20 05/06/20  Jae Dire, MD  clonazePAM (KLONOPIN) 0.25 MG disintegrating tablet Take 1 tablet (0.25 mg total) by mouth 2 (two) times daily. 04/06/20   Jae Dire, MD  DULoxetine (CYMBALTA) 30 MG capsule Take 1 capsule (30 mg total) by mouth daily. 04/07/20   Jae Dire, MD  haloperidol (HALDOL) 0.5 MG tablet Take 0.5 tablets (0.25 mg total) by mouth 2 (two) times daily. 04/06/20   Jae Dire, MD  lamoTRIgine (LAMICTAL) 150 MG tablet Take 1 tablet (150 mg total) by mouth 2 (two) times daily. 04/06/20   Jae Dire, MD  levETIRAcetam (KEPPRA) 1000 MG tablet Take 1 tablet (1,000 mg total) by mouth 2 (two) times daily. 04/06/20   Jae Dire, MD  OLANZapine zydis (ZYPREXA) 10 MG disintegrating tablet Take 1 tablet (10 mg total) by mouth at bedtime. 04/09/20   Kathlen Mody, MD  thiamine 100 MG tablet Take 1 tablet (100 mg total) by mouth daily. 04/07/20   Jae Dire, MD      VITAL SIGNS:  Blood pressure 115/71, pulse 90, temperature 97.6 F (36.4 C), temperature source Oral, resp. rate 18, height 5\' 9"  (1.753 m), weight 62.6 kg, SpO2 97 %.  PHYSICAL EXAMINATION:  GENERAL:  49 y.o.-year-old patient lying in the bed with no acute distress.  EYES: Pupils equal, round, reactive to light and accommodation. No scleral icterus. HEENT: Head atraumatic, normocephalic.  Oropharynx no erythema. NECK:  Supple, no jugular venous distention.  LUNGS: Normal breath sounds bilaterally, no wheezing, rales,rhonchi or crepitation. No use of accessory muscles of respiration.  CARDIOVASCULAR: S1, S2 normal. No murmurs, rubs, or gallops.  ABDOMEN: Soft, nontender, nondistended. EXTREMITIES: No pedal edema.  NEUROLOGIC: Cranial nerves II through XII are intact. Muscle strength 5/5 in all  extremities. Sensation intact. Gait not checked.  PSYCHIATRIC: The patient is alert and answers questions appropriately.  SKIN: No obvious rash, lesion, or ulcer.   LABORATORY PANEL:  Prior labs reviewed  IMPRESSION AND PLAN:   1.  Seizure disorder on Keppra and Lamictal.  Case discussed with neurology and these 2 medications would likely not cause stuttering and low speech. 2.  Stuttering and low speech.  Likely not secondary to the seizure medications. 3.  Previous alcohol abuse.  Continue thiamine 4.  Psychosis, depression.  Continue psychiatric medications as per psychiatrist. 5.  Weakness.  Continue physical therapy evaluation 6.  Check labs tomorrow morning CBC, BMP, B12, RPR and TSH.  All the records, laboratory and radiological data are reviewed and case discussed with Consulting provider. Management plans discussed with the patient, and he is in agreement.  CODE STATUS: Full code  TOTAL TIME TAKING CARE OF THIS PATIENT: 50 minutes.    [name] M.D on 04/19/2020 at 3:21 PM  Between 7am to 6pm - Pager - [phone]  After 6pm go to [email or web address] - password [email or web address]  [facility]  CC: Primary care Physician: Patient, No Pcp Per

## 2020-04-19 NOTE — Progress Notes (Signed)
Recreation Therapy Notes  Date: 04/19/2020  Time: 9:30 am   Location: Craft room     Behavioral response: N/A   Intervention Topic: Relaxation   Discussion/Intervention: Patient did not attend group.   Clinical Observations/Feedback:  Patient did not attend group.   Abdoul Encinas LRT/CTRS        Tadeo Besecker 04/19/2020 11:47 AM

## 2020-04-19 NOTE — Progress Notes (Signed)
Patient continues to be on 1:1 no distress noted will continue to monitor.  

## 2020-04-19 NOTE — Progress Notes (Addendum)
Patient alert and oriented x 3 with periods of confusion to situation , he appears less irritable and less impulsive , he follows simple command, thoughts are organized, speech is much clearer he is receptive to staff. Patient on 1:1 for safety, no distress noted he denies SI/HIAVH 15 minutes safety checks maintained will continue to monitor.closely.

## 2020-04-19 NOTE — Progress Notes (Signed)
Patient ID: Lance Ray, male   DOB: 08/19/1971, 49 y.o.   MRN: [id number]    Rama Candise Bowens MD   Remains on one to one with same meds and program including PT and await PT clearance  Very slow process waiting for funding for NH   Otherwise MS is not changed and already described before  Generally does not have capacity for consent  Am treatment team to discuss updates and progress   No other new medical problems or side effects for now and no new reported seizures

## 2020-04-19 NOTE — Plan of Care (Signed)
Patient got up and walked few steps with a steady gait.Denies SI,HI and AVH.No issues verbalized.Safety sitter with patient.Appetite and energy level good.Compliant with medications.Support and encouragement given.

## 2020-04-20 DIAGNOSIS — G40909 Epilepsy, unspecified, not intractable, without status epilepticus: Secondary | ICD-10-CM

## 2020-04-20 DIAGNOSIS — F321 Major depressive disorder, single episode, moderate: Secondary | ICD-10-CM | POA: Diagnosis not present

## 2020-04-20 DIAGNOSIS — R627 Adult failure to thrive: Secondary | ICD-10-CM

## 2020-04-20 DIAGNOSIS — F29 Unspecified psychosis not due to a substance or known physiological condition: Secondary | ICD-10-CM | POA: Diagnosis not present

## 2020-04-20 DIAGNOSIS — D519 Vitamin B12 deficiency anemia, unspecified: Secondary | ICD-10-CM

## 2020-04-20 DIAGNOSIS — F0281 Dementia in other diseases classified elsewhere with behavioral disturbance: Secondary | ICD-10-CM | POA: Diagnosis not present

## 2020-04-20 LAB — BASIC METABOLIC PANEL
Anion gap: 7 (ref 5–15)
BUN: 19 mg/dL (ref 6–20)
CO2: 28 mmol/L (ref 22–32)
Calcium: 9 mg/dL (ref 8.9–10.3)
Chloride: 106 mmol/L (ref 98–111)
Creatinine, Ser: 1.02 mg/dL (ref 0.61–1.24)
GFR calc Af Amer: >60 mL/min (ref >60)
GFR calc non Af Amer: >60 mL/min (ref >60)
Glucose, Bld: 88 mg/dL (ref 70–99)
Potassium: 4.3 mmol/L (ref 3.5–5.1)
Sodium: 141 mmol/L (ref 135–145)

## 2020-04-20 LAB — CBC
HCT: 43.6 % (ref 39.0–52.0)
Hemoglobin: 14.2 g/dL (ref 13.0–17.0)
MCH: 29.5 pg (ref 26.0–34.0)
MCHC: 32.6 g/dL (ref 30.0–36.0)
MCV: 90.6 fL (ref 80.0–100.0)
Platelets: 285 10*3/uL (ref 150–400)
RBC: 4.81 MIL/uL (ref 4.22–5.81)
RDW: 13.9 % (ref 11.5–15.5)
WBC: 4.9 10*3/uL (ref 4.0–10.5)
nRBC: 0 % (ref 0.0–0.2)

## 2020-04-20 LAB — RPR: RPR Ser Ql: NONREACTIVE

## 2020-04-20 LAB — VITAMIN B12: Vitamin B-12: 156 pg/mL — ABNORMAL LOW (ref 180–914)

## 2020-04-20 LAB — TSH: TSH: 1.514 u[IU]/mL (ref 0.350–4.500)

## 2020-04-20 MED ORDER — CYANOCOBALAMIN 1000 MCG/ML IJ SOLN
1000.0000 ug | Freq: Every day | INTRAMUSCULAR | Status: AC
  Administered 2020-04-20: 1000 ug via INTRAMUSCULAR
  Filled 2020-04-20 (×4): qty 1

## 2020-04-20 MED ORDER — VITAMIN B-12 1000 MCG PO TABS
1000.0000 ug | ORAL_TABLET | Freq: Every day | ORAL | Status: DC
  Administered 2020-04-23 – 2020-05-07 (×15): 1000 ug via ORAL
  Filled 2020-04-20 (×15): qty 1

## 2020-04-20 MED ORDER — DULOXETINE HCL 30 MG PO CPEP
60.0000 mg | ORAL_CAPSULE | Freq: Every day | ORAL | Status: DC
  Administered 2020-04-21 – 2020-04-26 (×6): 60 mg via ORAL
  Filled 2020-04-20 (×6): qty 2

## 2020-04-20 NOTE — Progress Notes (Signed)
Patient ID: Lance Ray, male   DOB: 11/30/1970, 49 y.o.   MRN: [id number] [facility] PROGRESS NOTE  Lance Ray ZLD:[id number] DOB: 12-Aug-1971 DOA: 04/09/2020 PCP: Patient, No Pcp Per  HPI/Subjective: Patient answers questions.  Sometimes difficult to understand with a low voice.  Offers no physical complaints.  Objective: Vitals:   04/18/20 0629 04/19/20 0637  BP: (!) 127/90 115/71  Pulse: (!) 31 90  Resp: 18 18  Temp:  97.6 F (36.4 C)  SpO2: (!) 80% 97%   No intake or output data in the 24 hours ending 04/20/20 1338 Filed Weights   04/09/20 1514  Weight: 62.6 kg    ROS: Review of Systems  Respiratory: Negative for shortness of breath.   Cardiovascular: Negative for chest pain.  Gastrointestinal: Negative for abdominal pain.  Genitourinary: Negative for dysuria.  Musculoskeletal: Negative for joint pain.   Exam: Physical Exam HENT:     Head: Normocephalic.     Nose: No mucosal edema.     Mouth/Throat:     Pharynx: No oropharyngeal exudate.  Eyes:     General: Lids are normal.     Conjunctiva/sclera: Conjunctivae normal.     Comments: Pupils dilated  Neck:     Thyroid: No thyroid mass or thyromegaly.     Vascular: No carotid bruit or JVD.  Cardiovascular:     Rate and Rhythm: Normal rate and regular rhythm.     Heart sounds: S1 normal and S2 normal.  Pulmonary:     Breath sounds: No decreased breath sounds, wheezing, rhonchi or rales.  Abdominal:     Palpations: Abdomen is soft.     Tenderness: There is no abdominal tenderness.  Musculoskeletal:     Cervical back: No edema.     Right ankle: No swelling.     Left ankle: No swelling.  Lymphadenopathy:     Cervical: No cervical adenopathy.  Skin:    General: Skin is warm.     Findings: No rash.  Neurological:     Mental Status: He is alert.     Cranial Nerves: No cranial nerve deficit.       Data Reviewed: Basic Metabolic Panel: Recent Labs  Lab 04/20/20 0730  NA 141  K 4.3   CL 106  CO2 28  GLUCOSE 88  BUN 19  CREATININE 1.02  CALCIUM 9.0   CBC: Recent Labs  Lab 04/20/20 0730  WBC 4.9  HGB 14.2  HCT 43.6  MCV 90.6  PLT 285     Scheduled Meds: . benztropine  1 mg Oral BID  . clonazePAM  0.5 mg Oral BID  . cyanocobalamin  1,000 mcg Intramuscular Daily  . [START ON 04/21/2020] DULoxetine  60 mg Oral Daily  . haloperidol  3 mg Oral QHS  . lamoTRIgine  150 mg Oral BID  . levETIRAcetam  1,000 mg Oral BID  . OLANZapine zydis  12.5 mg Oral QHS  . thiamine  100 mg Oral Daily  . [START ON 04/23/2020] vitamin B-12  1,000 mcg Oral Daily   Continuous Infusions:  Assessment/Plan:  1. Vitamin B12 deficiency.  Give IM B12 injections for 3 days and then oral B12 after that. 2. Seizure disorder on Keppra and Lamictal 3. Stuttering and low speech.  Likely not the seizure medications causing this. 4. Previous alcohol abuse on thiamine 5. Psychosis, depression..  Continue psychiatric medications as per psychiatrist. 6. Weakness.  Continue physical therapy evaluations.  Code Status:     Code Status Orders  (  From admission, onward)         Start     Ordered   04/09/20 1428  Full code  Continuous        04/09/20 1427        Code Status History    Date Active Date Inactive Code Status Order ID Comments User Context   03/24/2020 2314 04/09/2020 1424 Full Code [id number]  Ray, Lance Honey, MD ED   Advance Care Planning Activity      Disposition Plan:  Inpatient Disposition up to psychiatry.  Looks like they are looking into rehab options.   Time spent: 28 minutes  Lance Ray [employer]

## 2020-04-20 NOTE — Progress Notes (Signed)
Pt took meds. Pt is calm and cooperative.  New breakfast tray was ordered. Torrie Mayers RN

## 2020-04-20 NOTE — Plan of Care (Signed)
  Problem: Education: Goal: Knowledge of [facility] Education information/materials will improve Outcome: Progressing Goal: Emotional status will improve Outcome: Progressing Goal: Mental status will improve Outcome: Progressing Goal: Verbalization of understanding the information provided will improve Outcome: Progressing   Problem: Health Behavior/Discharge Planning: Goal: Identification of resources available to assist in meeting health care needs will improve Outcome: Progressing Goal: Compliance with treatment plan for underlying cause of condition will improve Outcome: Progressing   Problem: Physical Regulation: Goal: Ability to maintain clinical measurements within normal limits will improve Outcome: Progressing   Problem: Safety: Goal: Periods of time without injury will increase Outcome: Progressing   Problem: Education: Goal: Verbalization of understanding the information provided will improve Outcome: Progressing   Problem: Coping: Goal: Ability to demonstrate appropriate behavior when angry will improve Outcome: Progressing Goal: Ability to identify and develop effective coping behavior will improve Outcome: Progressing Goal: Ability to interact with others will improve Outcome: Progressing   

## 2020-04-20 NOTE — BHH Counselor (Signed)
CSW attempted to speak with the patient's sister.  CSW left HIPAA compliant voicemail with CSW staff contact information.  CSW received a voicemail prior to above mentioned call stating that the guardianship paperwork had been completed and that his sister planned to go to the DSS office to begin paperwork to apply for Medicaid for the patient.  Penni Homans, MSW, LCSW 04/20/2020 12:01 PM

## 2020-04-20 NOTE — Progress Notes (Signed)
[facility] MD Progress Note  04/20/2020 11:04 AM Lance Ray  MRN:  [id number] Subjective: [name] with a past psychiatric history significant for epilepsy, alcohol dependence and concern for dementia who was admitted on 04/09/2020 for agitation and confusion.  Objective: Patient is seen and examined.  [name] with the above stated past psychiatric history who is seen in follow-up.  He is fairly tremulous today.  He is alert and oriented x3.  He denied any side effects to his current medications.  Review of the electronic medical record apparently suggest that he remains in the hospital while placement is being worked on.  There is a note from 7/21 that the daughter stated that the state was going to appoint a guardian and appeal to the judge for emergency guardianship.  The principal problem in the chart right now is dementia with a history of recurrent seizures and psychosis.  He also was noted to failure to thrive in an adult.  A note from 7/22 showed that his dialogue was illogical and he did not make sense.  He felt as though this was a very unreliable history.  Reportedly his house was filthy, filled with trash, and unkept unsanitary conditions.  He has remained on one-to-one because of falls.  There were no observed seizures as of a note from 04/16/2020.  His current medications include clonazepam, Cogentin, Cymbalta, Haldol, hydralazine, Lamictal, Keppra, Zyprexa, thiamine.  Review of admission laboratories revealed a normal arterial blood gas on 7/7.  Metabolic panel was essentially normal including liver function enzymes.  On 7/10 an ammonia was checked and was only 20.  CBC from 7/27 was essentially normal.  TSH was 1.514.  RPR was nonreactive.  HIV was negative.  Anticonvulsant levels from 7/9 were present.  Vital signs are stable, he is afebrile.  Nursing notes reflect he only got 6 hours of sleep last night.  Principal Problem: Dementia (HCC) Diagnosis:  Principal Problem:   Dementia (HCC) Active Problems:   Recurrent seizures (HCC)   Seizure disorder (HCC)   Psychosis (HCC)   Failure to thrive in adult   Stuttering   Depression  Total Time spent with patient: 15 minutes  Past Psychiatric History: See admission H&P  Past Medical History:  Past Medical History:  Diagnosis Date  . Seizures (HCC)     Past Surgical History:  Procedure Laterality Date  . SKIN GRAFT     Family History:  Family History  Problem Relation Age of Onset  . Seizures Mother   . Cerebral aneurysm Father    Family Psychiatric  History: See admission H&P Social History:  Social History   Substance and Sexual Activity  Alcohol Use Yes  . Alcohol/week: 2.0 standard drinks  . Types: 2 Shots of liquor per week   Comment: "rarely"      Social History   Substance and Sexual Activity  Drug Use No    Social History   Socioeconomic History  . Marital status: Single    Spouse name: Not on file  . Number of children: Not on file  . Years of education: Not on file  . Highest education level: Not on file  Occupational History  . Not on file  Tobacco Use  . Smoking status: Current Some Day Smoker  . Smokeless tobacco: Current User  Vaping Use  . Vaping Use: Never used  Substance and Sexual Activity  . Alcohol use: Yes    Alcohol/week: 2.0 standard drinks  Types: 2 Shots of liquor per week    Comment: "rarely"   . Drug use: No  . Sexual activity: Yes  Other Topics Concern  . Not on file  Social History Narrative  . Not on file   Social Determinants of Health   Financial Resource Strain:   . Difficulty of Paying Living Expenses:   Food Insecurity:   . Worried About Programme researcher, broadcasting/film/videounning Out of Food in the Last Year:   . Baristaan Out of Food in the Last Year:   Transportation Needs:   . Freight forwarderLack of Transportation (Medical):   [name]. Lack of Transportation (Non-Medical):   Physical Activity:   . Days of Exercise per Week:   . Minutes of Exercise per Session:    Stress:   . Feeling of Stress :   Social Connections:   . Frequency of Communication with Friends and Family:   . Frequency of Social Gatherings with Friends and Family:   . Attends Religious Services:   . Active Member of Clubs or Organizations:   . Attends BankerClub or Organization Meetings:   [name]. Marital Status:    Additional Social History:                         Sleep: Poor  Appetite:  Fair  Current Medications: Current Facility-Administered Medications  Medication Dose Route Frequency Provider Last Rate Last Admin  . acetaminophen (TYLENOL) tablet 650 mg  650 mg Oral Q6H PRN [name], Jackquline DenmarkJohn T, MD   650 mg at 04/15/20 1850  . alum & mag hydroxide-simeth (MAALOX/MYLANTA) 200-200-20 MG/5ML suspension 30 mL  30 mL Oral Q4H PRN [name], [name], MD      . benztropine (COGENTIN) tablet 1 mg  1 mg Oral BID [name], Jackquline DenmarkJohn T, MD   1 mg at 04/20/20 0926  . clonazePAM (KLONOPIN) disintegrating tablet 0.5 mg  0.5 mg Oral BID Roselind Messierao, Ramakrishna, MD   0.5 mg at 04/20/20 0925  . DULoxetine (CYMBALTA) DR capsule 40 mg  40 mg Oral Daily Roselind Messierao, Ramakrishna, MD   40 mg at 04/20/20 [id number]  . haloperidol (HALDOL) tablet 2 mg  2 mg Oral Q6H PRN [name], Jackquline DenmarkJohn T, MD   2 mg at 04/12/20 2133   Or  . haloperidol lactate (HALDOL) injection 1 mg  1 mg Intravenous Q6H PRN [name], Jackquline DenmarkJohn T, MD   1 mg at 04/17/20 0110  . haloperidol (HALDOL) tablet 3 mg  3 mg Oral QHS Roselind Messierao, Ramakrishna, MD   3 mg at 04/19/20 2155  . hydrALAZINE (APRESOLINE) tablet 50 mg  50 mg Oral Q6H PRN [name], Jackquline DenmarkJohn T, MD      . lamoTRIgine (LAMICTAL) tablet 150 mg  150 mg Oral BID [name], Jackquline DenmarkJohn T, MD   150 mg at 04/20/20 0924  . levETIRAcetam (KEPPRA) tablet 1,000 mg  1,000 mg Oral BID [name], Jackquline DenmarkJohn T, MD   1,000 mg at 04/20/20 0926  . magnesium hydroxide (MILK OF MAGNESIA) suspension 30 mL  30 mL Oral Daily PRN [name], [name], MD      . OLANZapine (ZYPREXA) injection 10 mg  10 mg Intramuscular Q6H PRN [name], Jackquline DenmarkJohn T, MD   10 mg at 04/17/20  0110  . OLANZapine zydis (ZYPREXA) disintegrating tablet 12.5 mg  12.5 mg Oral QHS Roselind Messierao, Ramakrishna, MD   12.5 mg at 04/19/20 2155  . ondansetron (ZOFRAN) tablet 4 mg  4 mg Oral Q6H PRN [name], Jackquline DenmarkJohn T, MD       Or  .  ondansetron (ZOFRAN) injection 4 mg  4 mg Intravenous Q6H PRN [name], [name], MD      . thiamine tablet 100 mg  100 mg Oral Daily [name], [name], MD   100 mg at 04/20/20 4259    Lab Results:  Results for orders placed or performed during the hospital encounter of 04/09/20 (from the past 48 hour(s))  RPR     Status: None   Collection Time: 04/19/20  4:27 PM  Result Value Ref Range   RPR Ser Ql NON REACTIVE NON REACTIVE    Comment: Performed at [facility], [address], [place], [place] [address]  TSH     Status: None   Collection Time: 04/20/20  7:30 AM  Result Value Ref Range   TSH 1.514 0.350 - 4.500 uIU/mL    Comment: Performed by a 3rd Generation assay with a functional sensitivity of <=0.01 uIU/mL. Performed at [facility], [address] [place], [place] [address]   Basic metabolic panel     Status: None   Collection Time: 04/20/20  7:30 AM  Result Value Ref Range   Sodium 141 135 - 145 mmol/L   Potassium 4.3 3.5 - 5.1 mmol/L   Chloride 106 98 - 111 mmol/L   CO2 28 22 - 32 mmol/L   Glucose, Bld 88 70 - 99 mg/dL    Comment: Glucose reference range applies only to samples taken after fasting for at least 8 hours.   BUN 19 6 - 20 mg/dL   Creatinine, Ser 2.95 0.61 - 1.24 mg/dL   Calcium 9.0 8.9 - 18.8 mg/dL   GFR calc non Af Amer >60 >60 mL/min   GFR calc Af Amer >60 >60 mL/min   Anion gap 7 5 - 15    Comment: Performed at [facility], [address] [place], [place] [address]  CBC     Status: None   Collection Time: 04/20/20  7:30 AM  Result Value Ref Range   WBC 4.9 4.0 - 10.5 K/uL   RBC 4.81 4.22 - 5.81 MIL/uL   Hemoglobin 14.2 13.0 - 17.0 g/dL   HCT 63.0 39 - 52 %   MCV 90.6 80.0 - 100.0 fL   MCH 29.5 26.0 - 34.0 pg    MCHC 32.6 30.0 - 36.0 g/dL   RDW 16.0 10.9 - 32.3 %   Platelets 285 150 - 400 K/uL   nRBC 0.0 0.0 - 0.2 %    Comment: Performed at [facility], [address], [place], [place] [address]    Blood Alcohol level:  Lab Results  Component Value Date   ETH <10 11/03/2019   ETH <5 12/05/2016    Metabolic Disorder Labs: No results found for: HGBA1C, MPG No results found for: PROLACTIN No results found for: CHOL, TRIG, HDL, CHOLHDL, VLDL, LDLCALC  Physical Findings: AIMS:  , ,  ,  ,    CIWA:    COWS:     Musculoskeletal: Strength & Muscle Tone: decreased Gait & Station: unsteady Patient leans: N/A  Psychiatric Specialty Exam: Physical Exam Vitals and nursing note reviewed.  HENT:     Head: Normocephalic and atraumatic.  Pulmonary:     Effort: Pulmonary effort is normal.  Neurological:     General: No focal deficit present.     Mental Status: He is alert.     Review of Systems  Blood pressure 115/71, pulse 90, temperature 97.6 F (36.4 C), temperature source Oral, resp. rate  18, height 5\' 9"  (1.753 m), weight 62.6 kg, SpO2 97 %.Body mass index is 20.38 kg/m.  General Appearance: Disheveled  Eye Contact:  Fair  Speech:  Slurred  Volume:  Decreased  Mood:  Dysphoric  Affect:  Flat  Thought Process:  Goal Directed and Descriptions of Associations: Circumstantial  Orientation:  Full (Time, Place, and Person)  Thought Content:  Rumination  Suicidal Thoughts:  No  Homicidal Thoughts:  No  Memory:  Immediate;   Poor Recent;   Poor Remote;   Poor  Judgement:  Impaired  Insight:  Lacking  Psychomotor Activity:  Increased  Concentration:  Concentration: Fair and Attention Span: Fair  Recall:  of Knowledge:  Fair  Language:  Fair  Akathisia:  Negative  Handed:  Right  AIMS (if indicated):     Assets:  Desire for Improvement Resilience  ADL's:  Impaired  Cognition:  Impaired,  Moderate  Sleep:  Number of Hours: 6     Treatment Plan  Summary: Daily contact with patient to assess and evaluate symptoms and progress in treatment, Medication management and Plan : Patient is seen and examined.  [name] with the above-stated past psychiatric history who is seen in follow-up.   Diagnosis: 1.  Dementia 2.  Seizure disorder 3.  Hypertension 4.  Failure to thrive 5.  Unstable gait 6.  History of psychosis  Pertinent findings on examination today: 1.  Alert and oriented x3 2.  Speech is somewhat dysarthric, but is able to put a couple sentences together. 3.  Psychomotor agitation 4.  Unstable gait  Plan: 1.  Continue Cogentin 1 mg p.o. twice daily for now.  This is for akathisia, but may make dementia worse. 2.  Continue clonazepam 0.5 mg p.o. twice daily, but will consider changing to as needed if possible.  This is for anxiety. 3.  Increase Cymbalta 60 mg p.o. daily for anxiety and depression. 4.  Continue Haldol 3 mg p.o. nightly for sleep, mood stability as well as psychosis. 5.  Continue hydralazine 50 mg p.o. every 6 hours as needed for systolic blood pressure greater than 160. 6.  Continue Lamictal 150 mg p.o. twice daily for seizure disorder. 7.  Continue Keppra 1000 mg p.o. twice daily for seizure disorder. 8.  Continue Zyprexa Zydis 12.5 mg p.o. nightly for insomnia as well as psychosis. 9.  Continue Zofran 4 mg p.o. every 6 hours as needed nausea. 10.  Continue thiamine 100 mg p.o. daily for nutritional supplementation. 11.  Disposition planning-in progress.   44, MD 04/20/2020, 11:04 AM

## 2020-04-20 NOTE — Progress Notes (Signed)
Patient ID: Lance Ray, male   DOB: 1971-02-16, 49 y.o.   MRN: [id number]  We will sign off at this time  Treatment plan for B12 deficiency ordered.  Case discussed with psychiatrist  Dr [name]

## 2020-04-20 NOTE — Plan of Care (Signed)
Pt denies anxiety, depression, SI, HI and AVH. Pt was educated on care plan and verbalizes understanding. Pt was encouraged to attend out of the room when comfortable. Torrie Mayers RN Problem: Education: Goal: Knowledge of [facility] Education information/materials will improve Outcome: Progressing Goal: Emotional status will improve Outcome: Progressing Goal: Mental status will improve Outcome: Progressing Goal: Verbalization of understanding the information provided will improve Outcome: Progressing   Problem: Health Behavior/Discharge Planning: Goal: Identification of resources available to assist in meeting health care needs will improve Outcome: Progressing Goal: Compliance with treatment plan for underlying cause of condition will improve Outcome: Progressing   Problem: Physical Regulation: Goal: Ability to maintain clinical measurements within normal limits will improve Outcome: Progressing   Problem: Safety: Goal: Periods of time without injury will increase Outcome: Progressing   Problem: Education: Goal: Verbalization of understanding the information provided will improve Outcome: Progressing   Problem: Coping: Goal: Ability to demonstrate appropriate behavior when angry will improve Outcome: Progressing Goal: Ability to identify and develop effective coping behavior will improve Outcome: Progressing Goal: Ability to interact with others will improve Outcome: Progressing

## 2020-04-20 NOTE — Progress Notes (Signed)
Pt has a 1:1 sitter. Pt is calm and cooperative. Pt is safe. Torrie Mayers RN

## 2020-04-20 NOTE — Progress Notes (Signed)
Pt asleep. Will wait to give meds when he wakes up. Sitter at bedside. Pt is safe. Torrie Mayers RN

## 2020-04-20 NOTE — BHH Counselor (Signed)
CSW met with the patient to discuss care.  Patient often spoke very low and was difficult to understand, however he did respond to all questions.  CSW unsure if the responses were always appropriate.  CSW asked pt about if/how he was meeting his daily needs at home, pt reports he was not eating much and was able to bathe and toilet himself without assistance.  CSW asked if pt always had an unsteady gait and patient reports that this was something that has started since being hospitalized.    Assunta Curtis, MSW, LCSW 04/20/2020 12:00 PM

## 2020-04-21 DIAGNOSIS — F0281 Dementia in other diseases classified elsewhere with behavioral disturbance: Secondary | ICD-10-CM | POA: Diagnosis not present

## 2020-04-21 DIAGNOSIS — F29 Unspecified psychosis not due to a substance or known physiological condition: Secondary | ICD-10-CM | POA: Diagnosis not present

## 2020-04-21 DIAGNOSIS — R627 Adult failure to thrive: Secondary | ICD-10-CM | POA: Diagnosis not present

## 2020-04-21 DIAGNOSIS — F321 Major depressive disorder, single episode, moderate: Secondary | ICD-10-CM | POA: Diagnosis not present

## 2020-04-21 LAB — VALPROIC ACID LEVEL: Valproic Acid Lvl: <10 ug/mL — ABNORMAL LOW (ref 50.0–100.0)

## 2020-04-21 IMAGING — CT CT HEAD W/O CM
4 series · 16 of 47 positions shown, 18 images · non-contrast
Comparison: November 03, 2019 and September 02, 2019

CLINICAL DATA: Seizure

EXAM:
CT HEAD WITHOUT CONTRAST
TECHNIQUE: Contiguous axial images were obtained from the base of the skull
through the vertex without intravenous contrast.

[Series 2: head bone · axial · 0.42mm/px · z∈[+1141,+1173]mm · 3 of 78 slices shown]
[im 8/78  bone]
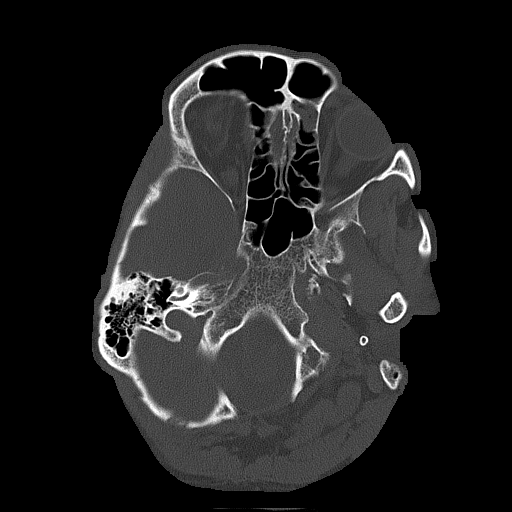
[im 16/78  bone]
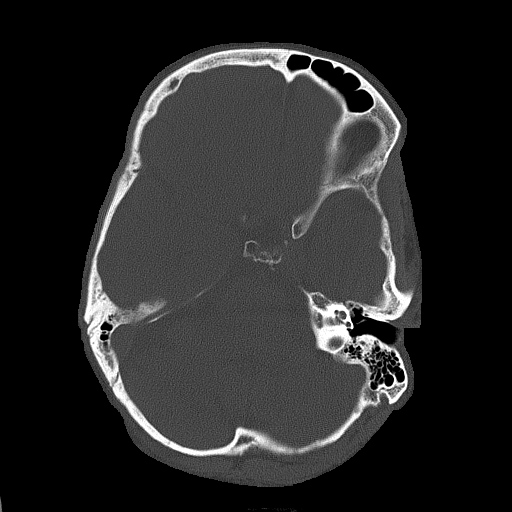
[im 24/78  bone]
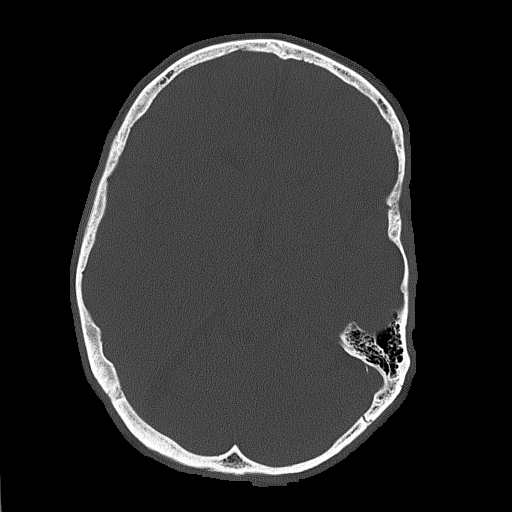

[Series 3: ax head wo · axial · 0.34mm/px · z∈[+1080,+1199]mm · 7 of 35 slices shown, 9 images]
[im 5/35  brain]
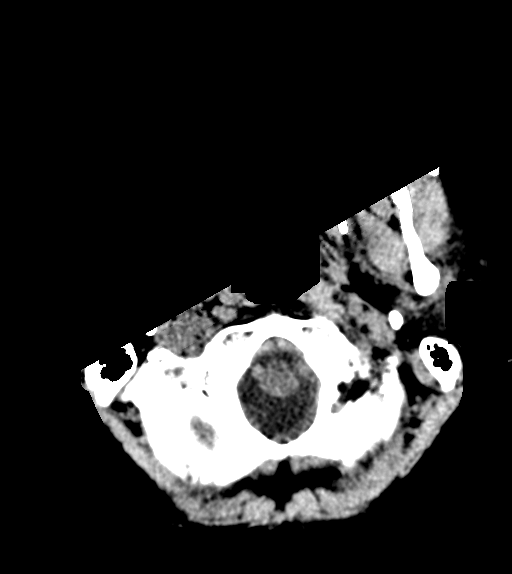
[im 5/35  bone]
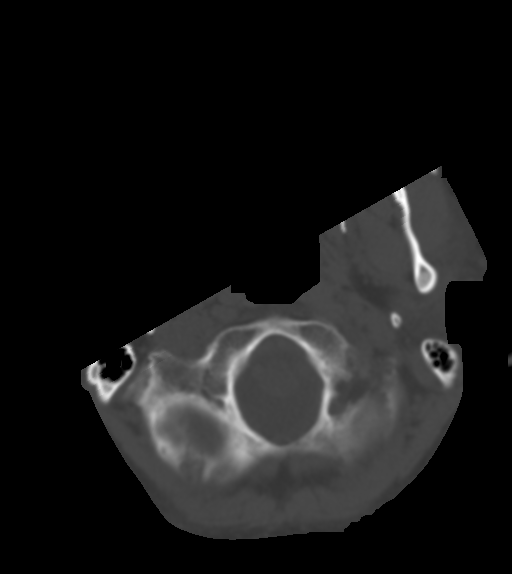
[im 9/35  brain]
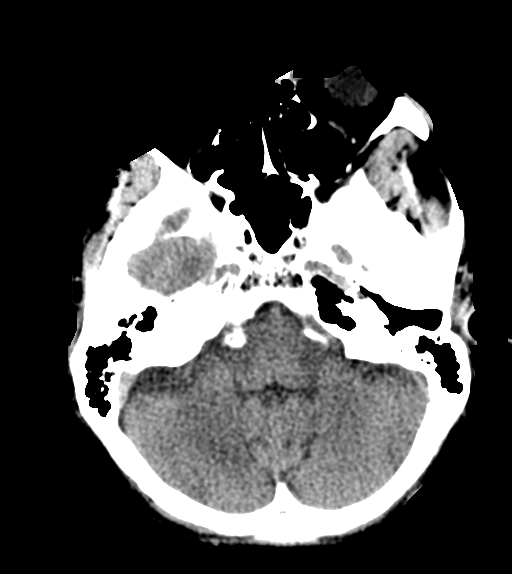
[im 13/35  brain]
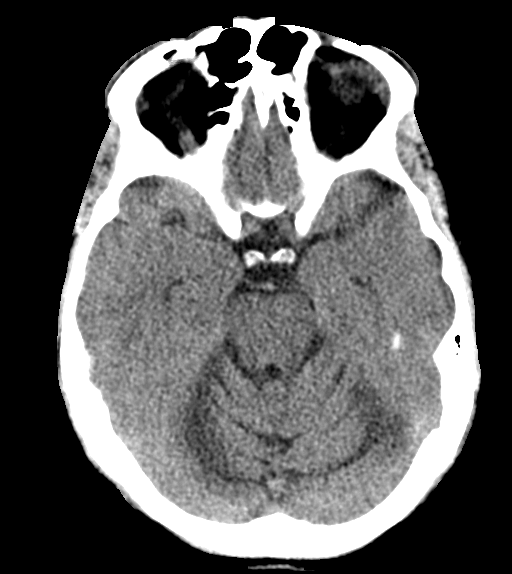
[im 18/35  brain]
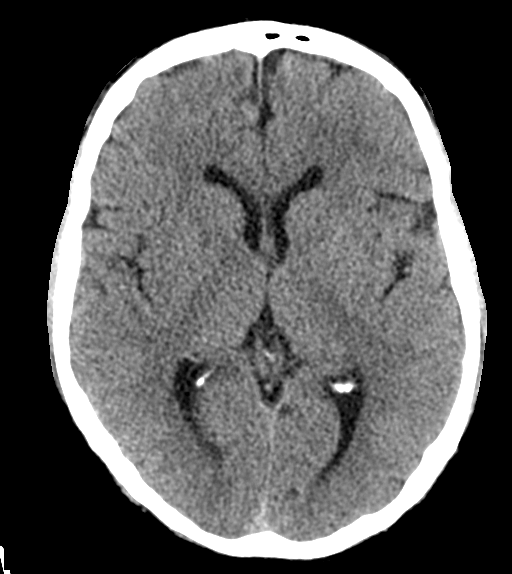
[im 22/35  brain]
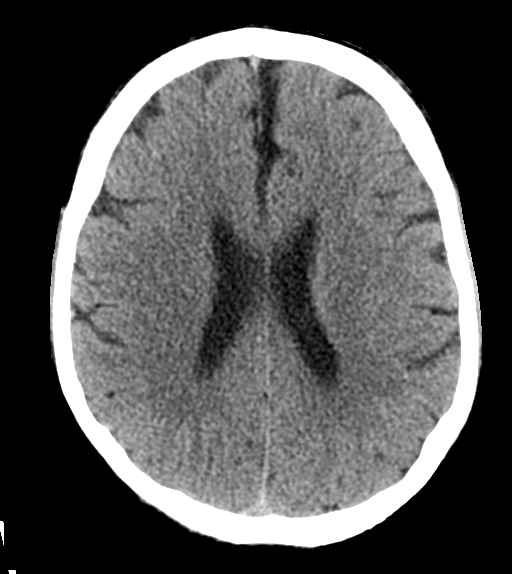
[im 22/35  bone]
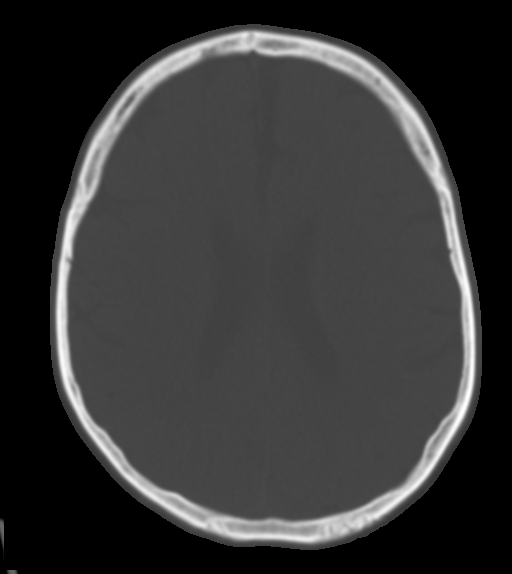
[im 26/35  brain]
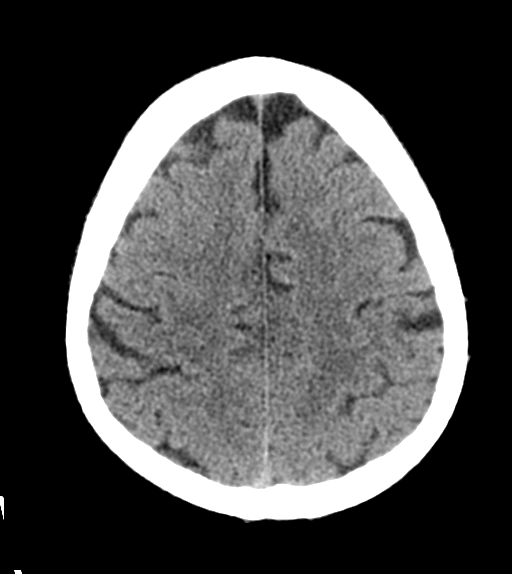
[im 30/35  brain]
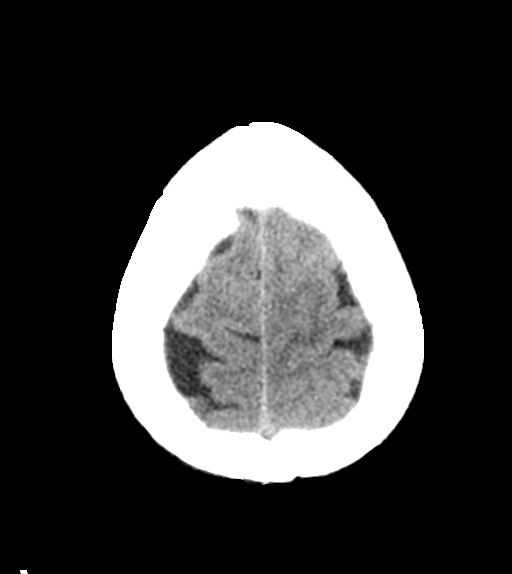

[Series 4: coronal soft tissue · coronal · 0.34mm/px · 3 of 65 slices shown]
[im 22/65  brain]
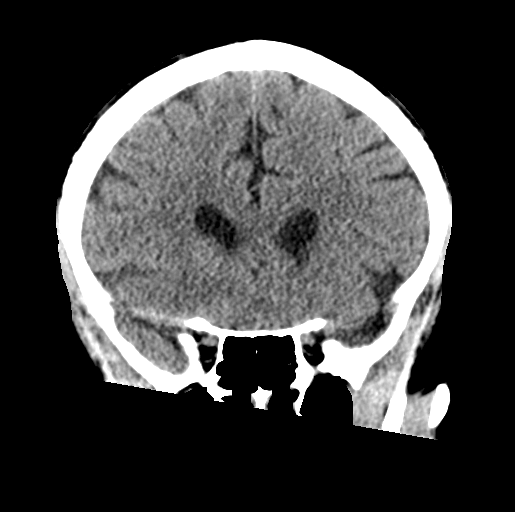
[im 29/65  brain]
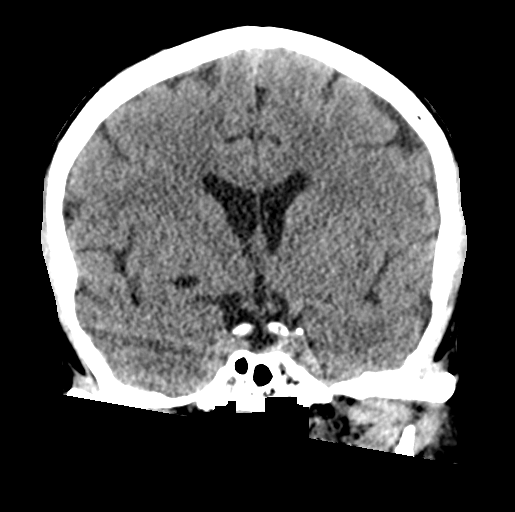
[im 36/65  brain]
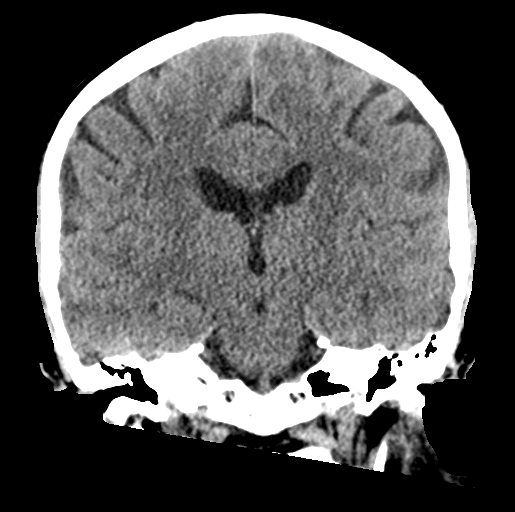

[Series 5: sagittal soft tissue · sagittal · 0.34mm/px · 3 of 58 slices shown]
[im 22/58  brain]
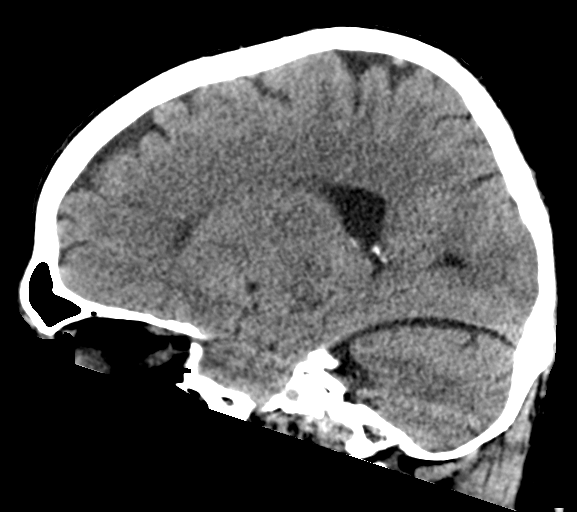
[im 29/58  brain]
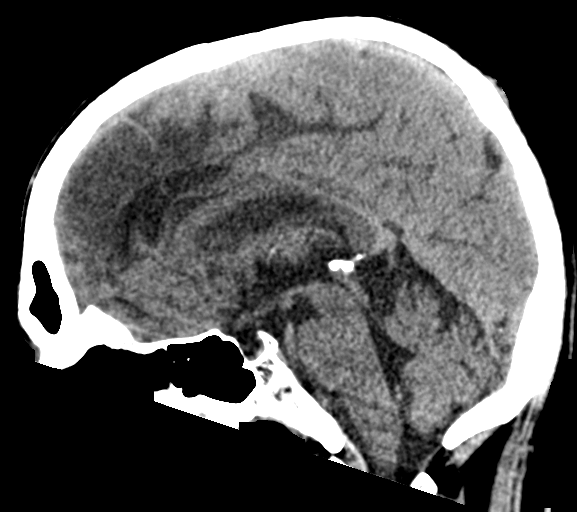
[im 36/58  brain]
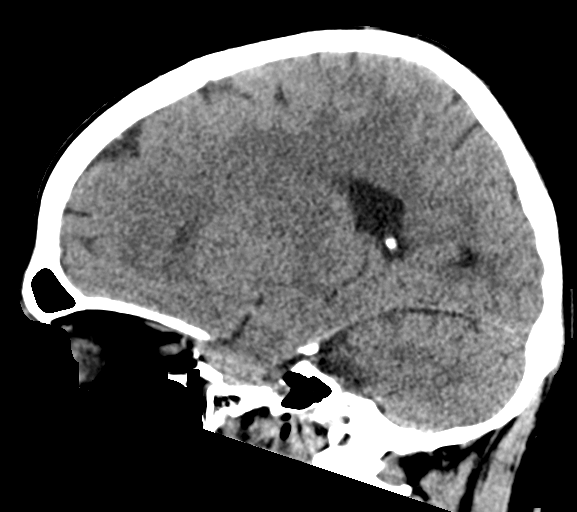

[16 of 47 positions shown; findings below may reference images not displayed]

FINDINGS: Brain: Ventricles and sulci are normal in size and configuration.
There are symmetric foci decreased attenuation in the medial
temporal lobes consistent with sulcal prominence, an anatomic
variant. There is no intracranial mass, hemorrhage, extra-axial
fluid collection, or midline shift. The brain parenchyma appears
unremarkable. No evident acute infarct.

Vascular: No hyperdense vessel. No appreciable vascular
calcification evident.

Skull: Bony calvarium appears intact.

Sinuses/Orbits: There is mucosal thickening and opacification
involving multiple ethmoid air cells. There is mild mucosal
thickening in the inferior left frontal sinus. Other visualized
paranasal sinuses are clear. Visualized orbits no appear symmetric
bilaterally.

Other: Visualized mastoid air cells are clear.
IMPRESSION: Brain parenchyma appears unremarkable. No acute infarct. No mass or
hemorrhage.

Areas of paranasal sinus disease noted.

## 2020-04-21 NOTE — Progress Notes (Signed)
[facility] MD Progress Note  04/21/2020 10:45 AM [facility]  MRN:  [id number] Subjective:  [name] with a past psychiatric history significant for epilepsy, alcohol dependence and concern for dementia who was admitted on 04/09/2020 for agitation and confusion.  Objective: Patient is seen and examined.  [name] with the above-stated past psychiatric history was seen in follow-up.  He is essentially unchanged.  He remains a placement issue.  He continues to have some weakness.  He continues with physical therapy.  His speech is more clear today, and he is less garbled and mumbled.  He remains alert and oriented to 3.  He did admit to confusion at times and wanted to be able to understand that better today.  His vital signs are stable, he is afebrile.  He slept 7 hours last night.  He denied any auditory or visual hallucinations.  He denied any suicidal or homicidal ideation.  His metabolic panel from 7/27 was essentially normal.  CBC was normal.  His TSH from 7/27 was 1.514.  Principal Problem: Dementia (HCC) Diagnosis: Principal Problem:   Dementia (HCC) Active Problems:   Recurrent seizures (HCC)   Seizure disorder (HCC)   Psychosis (HCC)   Failure to thrive in adult   Stuttering   Depression   Anemia due to vitamin B12 deficiency  Total Time spent with patient: 15 minutes  Past Psychiatric History: See admission H&P  Past Medical History:  Past Medical History:  Diagnosis Date  . Seizures (HCC)     Past Surgical History:  Procedure Laterality Date  . SKIN GRAFT     Family History:  Family History  Problem Relation Age of Onset  . Seizures Mother   . Cerebral aneurysm Father    Family Psychiatric  History: See admission H&P Social History:  Social History   Substance and Sexual Activity  Alcohol Use Yes  . Alcohol/week: 2.0 standard drinks  . Types: 2 Shots of liquor per week   Comment: "rarely"      Social History    Substance and Sexual Activity  Drug Use No    Social History   Socioeconomic History  . Marital status: Single    Spouse name: Not on file  . Number of children: Not on file  . Years of education: Not on file  . Highest education level: Not on file  Occupational History  . Not on file  Tobacco Use  . Smoking status: Current Some Day Smoker  . Smokeless tobacco: Current User  Vaping Use  . Vaping Use: Never used  Substance and Sexual Activity  . Alcohol use: Yes    Alcohol/week: 2.0 standard drinks    Types: 2 Shots of liquor per week    Comment: "rarely"   . Drug use: No  . Sexual activity: Yes  Other Topics Concern  . Not on file  Social History Narrative  . Not on file   Social Determinants of Health   Financial Resource Strain:   . Difficulty of Paying Living Expenses:   Food Insecurity:   . Worried About Programme researcher, broadcasting/film/video in the Last Year:   . Barista in the Last Year:   Transportation Needs:   . Freight forwarder (Medical):   [name] Lack of Transportation (Non-Medical):   Physical Activity:   . Days of Exercise per Week:   . Minutes of Exercise per Session:   Stress:   . Feeling of Stress :  Social Connections:   . Frequency of Communication with Friends and Family:   . Frequency of Social Gatherings with Friends and Family:   . Attends Religious Services:   . Active Member of Clubs or Organizations:   . Attends Banker Meetings:   [name] Marital Status:    Additional Social History:                         Sleep: Good  Appetite:  Fair  Current Medications: Current Facility-Administered Medications  Medication Dose Route Frequency Provider Last Rate Last Admin  . acetaminophen (TYLENOL) tablet 650 mg  650 mg Oral Q6H PRN [name], [name], MD   650 mg at 04/20/20 1248  . alum & mag hydroxide-simeth (MAALOX/MYLANTA) 200-200-20 MG/5ML suspension 30 mL  30 mL Oral Q4H PRN [name], [name], MD      . benztropine  (COGENTIN) tablet 1 mg  1 mg Oral BID [name], [name], MD   1 mg at 04/21/20 0848  . clonazePAM (KLONOPIN) disintegrating tablet 0.5 mg  0.5 mg Oral BID [name], MD   0.5 mg at 04/21/20 0848  . cyanocobalamin ((VITAMIN B-12)) injection 1,000 mcg  1,000 mcg Intramuscular Daily [name], MD   1,000 mcg at 04/20/20 1742  . DULoxetine (CYMBALTA) DR capsule 60 mg  60 mg Oral Daily [name], MD   60 mg at 04/21/20 0848  . haloperidol (HALDOL) tablet 2 mg  2 mg Oral Q6H PRN [name], [name], MD   2 mg at 04/12/20 2133   Or  . haloperidol lactate (HALDOL) injection 1 mg  1 mg Intravenous Q6H PRN [name], [name], MD   1 mg at 04/17/20 0110  . haloperidol (HALDOL) tablet 3 mg  3 mg Oral QHS [name], MD   3 mg at 04/20/20 2110  . hydrALAZINE (APRESOLINE) tablet 50 mg  50 mg Oral Q6H PRN [name], [name], MD      . lamoTRIgine (LAMICTAL) tablet 150 mg  150 mg Oral BID [name], [name], MD   150 mg at 04/21/20 0847  . levETIRAcetam (KEPPRA) tablet 1,000 mg  1,000 mg Oral BID [name], [name], MD   1,000 mg at 04/21/20 0849  . magnesium hydroxide (MILK OF MAGNESIA) suspension 30 mL  30 mL Oral Daily PRN [name], [name], MD      . OLANZapine (ZYPREXA) injection 10 mg  10 mg Intramuscular Q6H PRN [name], [name], MD   10 mg at 04/17/20 0110  . OLANZapine zydis (ZYPREXA) disintegrating tablet 12.5 mg  12.5 mg Oral QHS [name], MD   12.5 mg at 04/20/20 2110  . ondansetron (ZOFRAN) tablet 4 mg  4 mg Oral Q6H PRN [name], [name], MD       Or  . ondansetron (ZOFRAN) injection 4 mg  4 mg Intravenous Q6H PRN [name], [name], MD      . thiamine tablet 100 mg  100 mg Oral Daily [name], [name], MD   100 mg at 04/21/20 0848  . [START ON 04/23/2020] vitamin B-12 (CYANOCOBALAMIN) tablet 1,000 mcg  1,000 mcg Oral Daily [name], MD        Lab Results:  Results for orders placed or performed during the hospital encounter of 04/09/20 (from the past 48 hour(s))  RPR     Status:  None   Collection Time: 04/19/20  4:27 PM  Result Value Ref Range   RPR Ser Ql  NON REACTIVE NON REACTIVE    Comment: Performed at [facility], [address] [place], [place] [address]  Vitamin B12     Status: Abnormal   Collection Time: 04/20/20  7:30 AM  Result Value Ref Range   Vitamin B-12 156 (L) 180 - 914 pg/mL    Comment: (NOTE) This assay is not validated for testing neonatal or myeloproliferative syndrome specimens for Vitamin B12 levels. Performed at [facility], [address] [place], [place] [address]   TSH     Status: None   Collection Time: 04/20/20  7:30 AM  Result Value Ref Range   TSH 1.514 0.350 - 4.500 uIU/mL    Comment: Performed by a 3rd Generation assay with a functional sensitivity of <=0.01 uIU/mL. Performed at [facility], [address] [place], [place] [address]   Basic metabolic panel     Status: None   Collection Time: 04/20/20  7:30 AM  Result Value Ref Range   Sodium 141 135 - 145 mmol/L   Potassium 4.3 3.5 - 5.1 mmol/L   Chloride 106 98 - 111 mmol/L   CO2 28 22 - 32 mmol/L   Glucose, Bld 88 70 - 99 mg/dL    Comment: Glucose reference range applies only to samples taken after fasting for at least 8 hours.   BUN 19 6 - 20 mg/dL   Creatinine, Ser 2.131.02 0.61 - 1.24 mg/dL   Calcium 9.0 8.9 - 08.610.3 mg/dL   GFR calc non Af Amer >60 >60 mL/min   GFR calc Af Amer >60 >60 mL/min   Anion gap 7 5 - 15    Comment: Performed at [facility], [address] [place], [place] [address]  CBC     Status: None   Collection Time: 04/20/20  7:30 AM  Result Value Ref Range   WBC 4.9 4.0 - 10.5 K/uL   RBC 4.81 4.22 - 5.81 MIL/uL   Hemoglobin 14.2 13.0 - 17.0 g/dL   HCT 96.243.6 39 - 52 %   MCV 90.6 80.0 - 100.0 fL   MCH 29.5 26.0 - 34.0 pg   MCHC 32.6 30.0 - 36.0 g/dL   RDW 95.213.9 84.111.5 - 32.415.5 %   Platelets 285 150 - 400 K/uL   nRBC 0.0 0.0 - 0.2 %    Comment: Performed at [name], [address]  [place], [place] [address]    Blood Alcohol level:  Lab Results  Component Value Date   ETH <10 11/03/2019   ETH <5 12/05/2016    Metabolic Disorder Labs: No results found for: HGBA1C, MPG No results found for: PROLACTIN No results found for: CHOL, TRIG, HDL, CHOLHDL, VLDL, LDLCALC  Physical Findings: AIMS:  , ,  ,  ,    CIWA:    COWS:     Musculoskeletal: Strength & Muscle Tone: within normal limits Gait & Station: unsteady Patient leans: N/A  Psychiatric Specialty Exam: Physical Exam Vitals and nursing note reviewed.  HENT:     Head: Normocephalic and atraumatic.  Pulmonary:     Effort: Pulmonary effort is normal.  Neurological:     General: No focal deficit present.     Mental Status: He is alert and oriented to person, place, and time.     Motor: Weakness present.     Coordination: Coordination abnormal.     Gait: Gait abnormal.     Review of Systems  Blood pressure 117/79, pulse (!) 109, temperature 97.8  F (36.6 C), temperature source Oral, resp. rate 18, height 5\' 9"  (1.753 m), weight 62.6 kg, SpO2 100 %.Body mass index is 20.38 kg/m.  General Appearance: Disheveled  Eye Contact:  Fair  Speech:  Normal Rate  Volume:  Decreased  Mood:  Euthymic  Affect:  Congruent  Thought Process:  Goal Directed and Descriptions of Associations: Circumstantial  Orientation:  Full (Time, Place, and Person)  Thought Content:  Negative  Suicidal Thoughts:  No  Homicidal Thoughts:  No  Memory:  Immediate;   Poor Recent;   Poor Remote;   Poor  Judgement:  Intact  Insight:  Fair  Psychomotor Activity:  Decreased  Concentration:  Concentration: Fair and Attention Span: Fair  Recall:  of Knowledge:  Fair  Language:  Fair  Akathisia:  Negative  Handed:  Right  AIMS (if indicated):     Assets:  Desire for Improvement Resilience  ADL's:  Impaired  Cognition:  Impaired,  Moderate  Sleep:  Number of Hours: 7     Treatment Plan Summary: Daily contact with  patient to assess and evaluate symptoms and progress in treatment, Medication management and Plan : Patient is seen and examined.  [name] with the above-stated past psychiatric history who is seen in follow-up.   Diagnosis: 1.  Dementia 2.  Seizure disorder 3.  Hypertension 4.  Failure to thrive 5.  Unstable gait 6.  History of psychosis  Pertinent findings on examination today: 1.  Alert and oriented x3 2.  Speech is somewhat dysarthric, but is improving.  Patient was able to put a couple sentences together. 3.  Psychomotor agitation 4.  Unstable gait  Plan: 1.  Continue Cogentin 1 mg p.o. twice daily for now.  This is for akathisia, but may make dementia worse. 2.  Continue clonazepam 0.5 mg p.o. twice daily, but will consider changing to as needed if possible.  This is for anxiety. 3.  Increase Cymbalta 60 mg p.o. daily for anxiety and depression. 4.  Continue Haldol 3 mg p.o. nightly for sleep, mood stability as well as psychosis. 5.  Continue hydralazine 50 mg p.o. every 6 hours as needed for systolic blood pressure greater than 160. 6.  Continue Lamictal 150 mg p.o. twice daily for seizure disorder. 7.  Continue Keppra 1000 mg p.o. twice daily for seizure disorder. 8.  Continue Zyprexa Zydis 12.5 mg p.o. nightly for insomnia as well as psychosis. 9.  Continue Zofran 4 mg p.o. every 6 hours as needed nausea. 10.  Continue thiamine 100 mg p.o. daily for nutritional supplementation. 11.  Will get a Depakote level done on previously  drawn blood from 7/27. 12.  Disposition planning-in progress. 8/27, MD 04/21/2020, 10:45 AM

## 2020-04-21 NOTE — Progress Notes (Signed)
Recreation Therapy Notes  Date: 04/21/2020  Time: 9:30 am   Location: Craft room     Behavioral response: N/A   Intervention Topic: Coping skills   Discussion/Intervention: Patient did not attend group.   Clinical Observations/Feedback:  Patient did not attend group.   Chaia Ikard LRT/CTRS        Weslynn Ke 04/21/2020 12:39 PM

## 2020-04-21 NOTE — Progress Notes (Signed)
Patient is pleasant. He is oriented to person and place only at this encounter. He denies SI/HI/AVH depression and anxiety.  He has been active on the unit sitting in th milieu watching TV and interacting with other peers on the unit. He received his prescribed meds and tolerated without incident. He remains safe with 1:1 observation and 15 minute safety rounds.   Cleo Butler-Nicholson, LPN

## 2020-04-21 NOTE — BHH Counselor (Signed)
CSW spoke with pt sister, Weston Settle [phone] who reports on 7/26 she met with an attorney and filed for guardianship through the clerk of court. Levada Dy states she is waiting on hearing date to be scheduled. She states on 7/27 she went to DSS to get Medicaid application and is waiting on Guardian Ad Litem to be assigned to the case. She reports she is scheduled to have phone interview with social security on 8/13 at 11am to inquire about disability for the pt. Levada Dy reports the pt cannot live alone. She says she has limited space in her home and will be unable to house the pt. She states their other sister and her family may be moving to [place], [place] so they will not be able to house the pt. She denies there being other relatives being able to house the pt. CSW staffed case with supervisor(Zack Rolena Infante) who reports the pt will need medicaid and disability to be referred to a SNF or memory care unit.

## 2020-04-21 NOTE — Plan of Care (Addendum)
Pt denies depression, anxiety, SI, HI and AVH. Pt was educated on care plan and verbalizes understanding. Torrie Mayers RN Problem: Education: Goal: Knowledge of [place] [facility] Education information/materials will improve Outcome: Progressing Goal: Emotional status will improve Outcome: Progressing Goal: Mental status will improve Outcome: Progressing Goal: Verbalization of understanding the information provided will improve Outcome: Progressing   Problem: Health Behavior/Discharge Planning: Goal: Identification of resources available to assist in meeting health care needs will improve Outcome: Progressing Goal: Compliance with treatment plan for underlying cause of condition will improve Outcome: Progressing   Problem: Physical Regulation: Goal: Ability to maintain clinical measurements within normal limits will improve Outcome: Progressing   Problem: Safety: Goal: Periods of time without injury will increase Outcome: Progressing   Problem: Education: Goal: Verbalization of understanding the information provided will improve Outcome: Progressing   Problem: Coping: Goal: Ability to demonstrate appropriate behavior when angry will improve Outcome: Progressing Goal: Ability to identify and develop effective coping behavior will improve Outcome: Progressing Goal: Ability to interact with others will improve Outcome: Progressing

## 2020-04-22 DIAGNOSIS — F29 Unspecified psychosis not due to a substance or known physiological condition: Secondary | ICD-10-CM | POA: Diagnosis not present

## 2020-04-22 DIAGNOSIS — R627 Adult failure to thrive: Secondary | ICD-10-CM | POA: Diagnosis not present

## 2020-04-22 DIAGNOSIS — F0281 Dementia in other diseases classified elsewhere with behavioral disturbance: Secondary | ICD-10-CM | POA: Diagnosis not present

## 2020-04-22 DIAGNOSIS — F321 Major depressive disorder, single episode, moderate: Secondary | ICD-10-CM | POA: Diagnosis not present

## 2020-04-22 MED ORDER — OLANZAPINE 5 MG PO TBDP
10.0000 mg | ORAL_TABLET | Freq: Every day | ORAL | Status: DC
  Administered 2020-04-22: 10 mg via ORAL
  Filled 2020-04-22: qty 2

## 2020-04-22 MED ORDER — MEGESTROL ACETATE 20 MG PO TABS
40.0000 mg | ORAL_TABLET | Freq: Every day | ORAL | Status: DC
  Administered 2020-04-22 – 2020-05-01 (×10): 40 mg via ORAL
  Filled 2020-04-22 (×10): qty 2

## 2020-04-22 MED ORDER — CLONAZEPAM 0.25 MG PO TBDP: 0.5000 mg | ORAL_TABLET | Freq: Two times a day (BID) | ORAL | Status: DC | PRN

## 2020-04-22 MED ORDER — CYANOCOBALAMIN 1000 MCG/ML IJ SOLN: 1000.0000 ug | Freq: Every day | INTRAMUSCULAR | Status: DC

## 2020-04-22 NOTE — Progress Notes (Signed)
Pt has a 1 to 1 sitter. They have been walking around the unit with a gait belt on pt. Pt is much more steady today. Pt seems to be in a better mood and thinking more clearly. Torrie Mayers RN

## 2020-04-22 NOTE — Progress Notes (Signed)
Patient remains on 1:1 observation. He is pleasant and cooperative. He denies HI/SI/AVH depression and anxiety at this encounter.  He received his prescribed meds and tolerated without incident. He remains safe on the unit with the 1:1 and 15 minute safety checks.  Cleo Butler-Nicholson, LPN

## 2020-04-22 NOTE — Progress Notes (Signed)
Recreation Therapy Notes  Date: 04/22/2020   Time: 9:30 am   Location: Court yard    Behavioral response: N/A   Intervention Topic: Animal Assisted therapy     Discussion/Intervention: Patient did not attend group.   Clinical Observations/Feedback:  Patient did not attend group.   Syrenity Klepacki LRT/CTRS        Kasyn Rolph 04/22/2020 11:27 AM

## 2020-04-22 NOTE — Progress Notes (Signed)
Pt had a very good appetite when he woke up. He took meds, walked to day room with a gait belt on and around the hall with sitter.   Torrie Mayers RN

## 2020-04-22 NOTE — Plan of Care (Signed)
  Problem: Education: Goal: Knowledge of [place] [facility] Education information/materials will improve Outcome: Progressing Goal: Emotional status will improve Outcome: Progressing Goal: Mental status will improve Outcome: Progressing Goal: Verbalization of understanding the information provided will improve Outcome: Progressing   Problem: Health Behavior/Discharge Planning: Goal: Identification of resources available to assist in meeting health care needs will improve Outcome: Progressing Goal: Compliance with treatment plan for underlying cause of condition will improve Outcome: Progressing   Problem: Physical Regulation: Goal: Ability to maintain clinical measurements within normal limits will improve Outcome: Progressing   Problem: Safety: Goal: Periods of time without injury will increase Outcome: Progressing   Problem: Education: Goal: Verbalization of understanding the information provided will improve Outcome: Progressing   Problem: Coping: Goal: Ability to demonstrate appropriate behavior when angry will improve Outcome: Progressing Goal: Ability to identify and develop effective coping behavior will improve Outcome: Progressing Goal: Ability to interact with others will improve Outcome: Progressing   

## 2020-04-22 NOTE — Tx Team (Signed)
Interdisciplinary Treatment and Diagnostic Plan Update 04/17/2020 Time of Session: 830am Lance Ray MRN: [id number]  Principal Diagnosis: Dementia [facility])  Secondary Diagnoses: Principal Problem:   Dementia (HCC) Active Problems:   Recurrent seizures (HCC)   Psychosis (HCC)   Failure to thrive in adult   Current Medications:           Current Facility-Administered Medications  Medication Dose Route Frequency Provider Last Rate Last Admin  . acetaminophen (TYLENOL) tablet 650 mg  650 mg Oral Q6H PRN [name], [name], MD      . alum & mag hydroxide-simeth (MAALOX/MYLANTA) 200-200-20 MG/5ML suspension 30 mL  30 mL Oral Q4H PRN [name], [name], MD      . benztropine (COGENTIN) tablet 1 mg  1 mg Oral BID [name], [name], MD   1 mg at 04/11/20 1053  . clonazePAM (KLONOPIN) disintegrating tablet 0.5 mg  0.5 mg Oral BID [name], MD   0.5 mg at 04/11/20 1054  . DULoxetine (CYMBALTA) DR capsule 40 mg  40 mg Oral Daily [name], MD   40 mg at 04/11/20 1053  . haloperidol (HALDOL) tablet 0.5 mg  0.5 mg Oral BID [name], [name], MD   0.5 mg at 04/11/20 1055  . haloperidol (HALDOL) tablet 2 mg  2 mg Oral Q6H PRN [name], [name], MD       Or  . haloperidol lactate (HALDOL) injection 1 mg  1 mg Intravenous Q6H PRN [name], [name], MD      . hydrALAZINE (APRESOLINE) tablet 50 mg  50 mg Oral Q6H PRN [name], [name], MD      . lamoTRIgine (LAMICTAL) tablet 150 mg  150 mg Oral BID [name], [name], MD   150 mg at 04/11/20 1054  . levETIRAcetam (KEPPRA) tablet 1,000 mg  1,000 mg Oral BID [name], [name], MD   1,000 mg at 04/11/20 1054  . magnesium hydroxide (MILK OF MAGNESIA) suspension 30 mL  30 mL Oral Daily PRN [name], [name], MD      . OLANZapine (ZYPREXA) injection 10 mg  10 mg Intramuscular Q6H PRN [name], [name], MD      . OLANZapine zydis (ZYPREXA) disintegrating tablet 12.5 mg  12.5 mg Oral QHS [name], MD   12.5 mg at 04/10/20 2151  . ondansetron (ZOFRAN)  tablet 4 mg  4 mg Oral Q6H PRN [name], [name], MD       Or  . ondansetron (ZOFRAN) injection 4 mg  4 mg Intravenous Q6H PRN [name], [name], MD      . thiamine tablet 100 mg  100 mg Oral Daily [name], [name], MD   100 mg at 04/11/20 1053   PTA Medications:        Medications Prior to Admission  Medication Sig Dispense Refill Last Dose  . benztropine (COGENTIN) 1 MG tablet Take 1 tablet (1 mg total) by mouth 2 (two) times daily. 60 tablet 0   . clonazePAM (KLONOPIN) 0.25 MG disintegrating tablet Take 1 tablet (0.25 mg total) by mouth 2 (two) times daily. 60 tablet 0   . DULoxetine (CYMBALTA) 30 MG capsule Take 1 capsule (30 mg total) by mouth daily.  3   . haloperidol (HALDOL) 0.5 MG tablet Take 0.5 tablets (0.25 mg total) by mouth 2 (two) times daily.     [name] lamoTRIgine (LAMICTAL) 150 MG tablet Take 1 tablet (150 mg total) by mouth 2 (two) times daily.     [name] levETIRAcetam (KEPPRA)  1000 MG tablet Take 1 tablet (1,000 mg total) by mouth 2 (two) times daily.     [name] OLANZapine zydis (ZYPREXA) 10 MG disintegrating tablet Take 1 tablet (10 mg total) by mouth at bedtime. 30 tablet 0   . thiamine 100 MG tablet Take 1 tablet (100 mg total) by mouth daily.       Patient Stressors: Financial difficulties Health problems Medication change or noncompliance Substance abuse Traumatic event  Patient Strengths: Motivation for treatment/growth Supportive family/friends  Treatment Modalities: Medication Management, Group therapy, Case management,  1 to 1 session with clinician, Psychoeducation, Recreational therapy.   Physician Treatment Plan for Primary Diagnosis: Dementia [facility]) Long Term Goal(s): Improvement in symptoms so as ready for discharge Improvement in symptoms so as ready for discharge   Short Term Goals: Ability to identify and develop effective coping behaviors will improve Ability to maintain clinical measurements within normal limits will improve Compliance  with prescribed medications will improve Ability to demonstrate self-control will improve Compliance with prescribed medications will improve  Medication Management: Evaluate patient's response, side effects, and tolerance of medication regimen.  Therapeutic Interventions: 1 to 1 sessions, Unit Group sessions and Medication administration.  Evaluation of Outcomes:  Progressing  Physician Treatment Plan for Secondary Diagnosis: Principal Problem:   Dementia (HCC) Active Problems:   Recurrent seizures (HCC)   Psychosis (HCC)   Failure to thrive in adult  Long Term Goal(s): Improvement in symptoms so as ready for discharge Improvement in symptoms so as ready for discharge   Short Term Goals: Ability to identify and develop effective coping behaviors will improve Ability to maintain clinical measurements within normal limits will improve Compliance with prescribed medications will improve Ability to demonstrate self-control will improve Compliance with prescribed medications will improve     Medication Management: Evaluate patient's response, side effects, and tolerance of medication regimen.  Therapeutic Interventions: 1 to 1 sessions, Unit Group sessions and Medication administration.  Evaluation of Outcomes:  Progressing   RN Treatment Plan for Primary Diagnosis: Dementia (HCC) Long Term Goal(s): Knowledge of disease and therapeutic regimen to maintain health will improve  Short Term Goals: Ability to demonstrate self-control, Ability to participate in decision making will improve and Compliance with prescribed medications will improve  Medication Management: RN will administer medications as ordered by provider, will assess and evaluate patient's response and provide education to patient for prescribed medication. RN will report any adverse and/or side effects to prescribing provider.  Therapeutic Interventions: 1 on 1 counseling sessions, Psychoeducation,  Medication administration, Evaluate responses to treatment, Monitor vital signs and CBGs as ordered, Perform/monitor CIWA, COWS, AIMS and Fall Risk screenings as ordered, Perform wound care treatments as ordered.  Evaluation of Outcomes:  Progressing   LCSW Treatment Plan for Primary Diagnosis: Dementia (HCC) Long Term Goal(s): Safe transition to appropriate next level of care at discharge, Engage patient in therapeutic group addressing interpersonal concerns.  Short Term Goals: Engage patient in aftercare planning with referrals and resources  Therapeutic Interventions: Assess for all discharge needs, 1 to 1 time with Social worker, Explore available resources and support systems, Assess for adequacy in community support network, Educate family and significant other(s) on suicide prevention, Complete Psychosocial Assessment, Interpersonal group therapy.  Evaluation of Outcomes: Progressing   Progress in Treatment: Attending groups: No. Participating in groups: No.  Taking medication as prescribed: Yes. Toleration medication: Yes. Family/Significant other contact made: Yes pt gave verbal permission to speak with his sisters. Patient understands diagnosis: No. Discussing patient identified problems/goals with  staff: Yes. Medical problems stabilized or resolved: No. Denies suicidal/homicidal ideation: Yes. Issues/concerns per patient self-inventory: No. Other: Patient requires 1:1 to move around millieu   New problem(s) identified: No, Describe:  None  New Short Term/Long Term Goal(s): medication stabilization, elimination of SI thoughts, development of comprehensive mental wellness plan   Patient Goals:  Patient spoke about his seizures. Patient is still not aware of the date/time of day.   Discharge Plan or Barriers: Patient has still not transitioned to a nursing home due to funding. Family is working on [employer]. Update 04/22/20-Pt sister is pursuing guardianship,  medicaid and disability on behalf of the pt. Sister expressed concern about pt not being able to properly care for himself and request pt be referred to a SNF. Sister has phone interview with social security on 8/13.  Reason for Continuation of Hospitalization: Other; describe Dementia   Estimated Length of Stay: TBD   Attendees: Patient: 04/17/2020 4:17 PM  Physician: Landry Mellow 04/17/2020 4:17 PM  Nursing: Torrie Mayers, Maryelizabeth Kaufmann Manuaritti 04/17/2020 4:17 PM  RN Care Manager: 04/17/2020 4:17 PM  Social Worker:  Lowella Dandy 04/17/2020 4:17 PM  Recreational Therapist:  04/17/2020 4:17 PM  Other:  04/17/2020 4:17 PM  Other:  04/17/2020 4:17 PM  Other: 04/17/2020 4:17 PM    Scribe for Treatment Team: Susa Simmonds, LCSWA 04/17/2020 4:17 PM

## 2020-04-22 NOTE — Progress Notes (Signed)
Physical Therapy Treatment Patient Details Name: Lance Ray MRN: [id number] DOB: Dec 07, 1970 Today's Date: 04/22/2020    History of Present Illness Lance Ray was admitted on behavior health unit on 7/16 after being on medical unit for seizures x 2 weeks. Pt with recent diagnosis of dementia. Pt seen by PT on medical unit, now evaluated on BH. PMH includes epilepsy and depression.    PT Comments    Pt received lying in bed with 1:1 sitter present. Pt initially agreeable to participate performing ambulation in hallway with min A for steadying and safety. Pt with improved gait quality however continues to demonstrate decreased control of foot placement as pt with very hard stepping. Pt made impulsive movements requiring mod A and increased verbal cues for directional movement to return to room. Pt assisted to bathroom and  attempted to sit to void with pants still on. Increased verbal cues to pull down pants prior to sitting down. Pt required min A for balance standing from low commode. Pt performed therex in bed for BLE strengthening. Pt difficult to understand this session but was heard stating "I feel like you are forcing me" and "I think you don't want to take me where I'm asking to go". Pt session ended as pt appeared to be getting slightly agitated with clinician giving commands. Will continue to follow and progress pt as tolerated.   Follow Up Recommendations  SNF;Supervision/Assistance - 24 hour     Equipment Recommendations  Rolling walker with 5" wheels    Recommendations for Other Services       Precautions / Restrictions Precautions Precautions: Fall Restrictions Weight Bearing Restrictions: No    Mobility  Bed Mobility Overal bed mobility: Needs Assistance Bed Mobility: Supine to Sit;Sit to Supine     Supine to sit: Supervision Sit to supine: Supervision   General bed mobility comments: supervision for safety as pt with history of impulsive movements; no  physical assistance required  Transfers Overall transfer level: Needs assistance Equipment used: 1 person hand held assist Transfers: Sit to/from Stand Sit to Stand: Min guard         General transfer comment: pt with impulsive sit to stands standing before requested and requiring CGA for steadying as pt with posterior lean; pt with quick descent to bed with SBA  Ambulation/Gait Ambulation/Gait assistance: Min assist Gait Distance (Feet): 150 Feet Assistive device: None Gait Pattern/deviations: Step-through pattern;Narrow base of support Gait velocity: somewhat normal   General Gait Details: pt ambulated with min A for steadying with reciprocal step pattern, narrow BOS and decreased control with placement of feet as pt steps hard bilaterally; pt with impulsive movements during gait demonstrating decreased safety awareness; required increased verbal cues for directional movements in hallway   Stairs             Wheelchair Mobility    Modified Rankin (Stroke Patients Only)       Balance Overall balance assessment: Needs assistance Sitting-balance support: Feet supported Sitting balance-Leahy Scale: Fair Sitting balance - Comments: pt with fair balance requiring SBA for impulsivity of movements Postural control: Posterior lean Standing balance support: No upper extremity supported;Single extremity supported Standing balance-Leahy Scale: Fair Standing balance comment: CGA-min A for static standing balance as pt with and without RUE on bedrail standing next to bed; pt with posterior lean noted                             Cognition Arousal/Alertness:  Awake/alert Behavior During Therapy: Flat affect;Anxious Overall Cognitive Status: Difficult to assess Area of Impairment: Following commands;Safety/judgement;Awareness;Problem solving                   Current Attention Level: Alternating;Focused Memory: Decreased recall of precautions;Decreased  short-term memory Following Commands: Follows one step commands consistently;Follows one step commands with increased time;Follows multi-step commands inconsistently;Follows multi-step commands with increased time Safety/Judgement: Decreased awareness of safety;Decreased awareness of deficits Awareness: Intellectual Problem Solving: Slow processing;Difficulty sequencing;Requires verbal cues;Requires tactile cues        Exercises Other Exercises Other Exercises: pt performed bridges x 15 with verbal cues    General Comments        Pertinent Vitals/Pain Pain Assessment: Faces Faces Pain Scale: Hurts a little bit Pain Location: pt rubbing L forearm Pain Descriptors / Indicators: Discomfort Pain Intervention(s): Monitored during session    Home Living                      Prior Function            PT Goals (current goals can now be found in the care plan section) Acute Rehab PT Goals Patient Stated Goal: none stated PT Goal Formulation: Patient unable to participate in goal setting Time For Goal Achievement: 04/24/20 Potential to Achieve Goals: Fair Progress towards PT goals: Progressing toward goals    Frequency    Min 2X/week      PT Plan Current plan remains appropriate    Co-evaluation              AM-PAC PT "6 Clicks" Mobility   Outcome Measure  Help needed turning from your back to your side while in a flat bed without using bedrails?: A Little Help needed moving from lying on your back to sitting on the side of a flat bed without using bedrails?: A Little Help needed moving to and from a bed to a chair (including a wheelchair)?: A Little Help needed standing up from a chair using your arms (e.g., wheelchair or bedside chair)?: A Little Help needed to walk in hospital room?: A Little Help needed climbing 3-5 steps with a railing? : A Lot 6 Click Score: 17    End of Session Equipment Utilized During Treatment: Gait belt Activity Tolerance:  Treatment limited secondary to agitation Patient left: in bed;with nursing/sitter in room Nurse Communication: Mobility status PT Visit Diagnosis: Unsteadiness on feet (R26.81);Other abnormalities of gait and mobility (R26.89);Muscle weakness (generalized) (M62.81)     Time: 5374-8270 PT Time Calculation (min) (ACUTE ONLY): 11 min  Charges:                        Frederich Chick, SPT   Frederich Chick 04/22/2020, 3:09 PM

## 2020-04-22 NOTE — Progress Notes (Signed)
Pt is still asleep. Sitter at bedside. Pt is safe. Torrie Mayers RN

## 2020-04-22 NOTE — Progress Notes (Addendum)
D- Patient alert and oriented. Affect/mood is calm and cooperative. Pt denies SI, HI, AVH, and pain.[name]   A- Scheduled medications administered to patient, per MD orders. Support and encouragement provided.  Routine safety checks conducted every 15 minutes.  Patient informed to notify staff with problems or concerns.  R- No adverse drug reactions noted. Patient contracts for safety at this time. Patient compliant with medications and treatment plan. Patient receptive, calm, and cooperative. Patient interacts well with others on the unit.  Patient remains safe at this time.  Pt remains with a sitter.    Torrie Mayers RN

## 2020-04-22 NOTE — Plan of Care (Signed)
  Problem: Education: Goal: Knowledge of  General Education information/materials will improve Outcome: Progressing Goal: Emotional status will improve Outcome: Progressing Goal: Mental status will improve Outcome: Progressing Goal: Verbalization of understanding the information provided will improve Outcome: Progressing   Problem: Health Behavior/Discharge Planning: Goal: Identification of resources available to assist in meeting health care needs will improve Outcome: Progressing Goal: Compliance with treatment plan for underlying cause of condition will improve Outcome: Progressing   Problem: Physical Regulation: Goal: Ability to maintain clinical measurements within normal limits will improve Outcome: Progressing   Problem: Safety: Goal: Periods of time without injury will increase Outcome: Progressing   Problem: Education: Goal: Verbalization of understanding the information provided will improve Outcome: Progressing   Problem: Coping: Goal: Ability to demonstrate appropriate behavior when angry will improve Outcome: Progressing Goal: Ability to identify and develop effective coping behavior will improve Outcome: Progressing Goal: Ability to interact with others will improve Outcome: Progressing   

## 2020-04-22 NOTE — Progress Notes (Signed)
Pt has a 1 to 1 sitter. Pt is safe and asleep. Torrie Mayers RN

## 2020-04-22 NOTE — Progress Notes (Signed)
[facility] MD Progress Note  04/22/2020 11:41 AM Lance Ray  MRN:  [id number] Subjective:  [name] with a past psychiatric history significant for epilepsy, alcohol dependence and concern for dementia who was admitted on 04/09/2020 for agitation and confusion.  Objective: Patient is seen and examined.  [name] with the above-stated past medical and psychiatric history is seen in follow-up.  He is very sedated today.  He had poor intake today.  Nursing staff stated that he was up and around and walking a bit more yesterday.  He is mildly sedated during the interview, and not a great deal of information is collected from this.  He denied auditory or visual hallucinations.  He denied any suicidal or homicidal ideation.  His vital signs are stable, he is afebrile.  His pulse oximetry is 100% on room air.  He slept 7 hours last night.  No new laboratories.  Principal Problem: Dementia (HCC) Diagnosis: Principal Problem:   Dementia (HCC) Active Problems:   Recurrent seizures (HCC)   Seizure disorder (HCC)   Psychosis (HCC)   Failure to thrive in adult   Stuttering   Depression   Anemia due to vitamin B12 deficiency  Total Time spent with patient: 15 minutes  Past Psychiatric History: See admission H&P  Past Medical History:  Past Medical History:  Diagnosis Date   Seizures (HCC)     Past Surgical History:  Procedure Laterality Date   SKIN GRAFT     Family History:  Family History  Problem Relation Age of Onset   Seizures Mother    Cerebral aneurysm Father    Family Psychiatric  History: See admission H&P Social History:  Social History   Substance and Sexual Activity  Alcohol Use Yes   Alcohol/week: 2.0 standard drinks   Types: 2 Shots of liquor per week   Comment: "rarely"      Social History   Substance and Sexual Activity  Drug Use No    Social History   Socioeconomic History   Marital status: Single    Spouse  name: Not on file   Number of children: Not on file   Years of education: Not on file   Highest education level: Not on file  Occupational History   Not on file  Tobacco Use   Smoking status: Current Some Day Smoker   Smokeless tobacco: Current User  Vaping Use   Vaping Use: Never used  Substance and Sexual Activity   Alcohol use: Yes    Alcohol/week: 2.0 standard drinks    Types: 2 Shots of liquor per week    Comment: "rarely"    Drug use: No   Sexual activity: Yes  Other Topics Concern   Not on file  Social History Narrative   Not on file   Social Determinants of Health   Financial Resource Strain:    Difficulty of Paying Living Expenses:   Food Insecurity:    Worried About Programme researcher, broadcasting/film/video in the Last Year:    Barista in the Last Year:   Transportation Needs:    Freight forwarder (Medical):    Lack of Transportation (Non-Medical):   Physical Activity:    Days of Exercise per Week:    Minutes of Exercise per Session:   Stress:    Feeling of Stress :   Social Connections:    Frequency of Communication with Friends and Family:    Frequency of Social Gatherings with  Friends and Family:    Attends Religious Services:    Active Member of Clubs or Organizations:    Attends Banker Meetings:    Marital Status:    Additional Social History:                         Sleep: Good  Appetite:  Poor  Current Medications: Current Facility-Administered Medications  Medication Dose Route Frequency Provider Last Rate Last Admin   acetaminophen (TYLENOL) tablet 650 mg  650 mg Oral Q6H PRN [name], [name], MD   650 mg at 04/20/20 1248   alum & mag hydroxide-simeth (MAALOX/MYLANTA) 200-200-20 MG/5ML suspension 30 mL  30 mL Oral Q4H PRN [name], [name], MD       benztropine (COGENTIN) tablet 1 mg  1 mg Oral BID [name], [name], MD   1 mg at 04/22/20 1137   clonazePAM (KLONOPIN) disintegrating tablet 0.5 mg  0.5  mg Oral BID [name], MD   0.5 mg at 04/22/20 1139   cyanocobalamin ((VITAMIN B-12)) injection 1,000 mcg  1,000 mcg Intramuscular Daily [name], MD   1,000 mcg at 04/20/20 1742   DULoxetine (CYMBALTA) DR capsule 60 mg  60 mg Oral Daily [name], MD   60 mg at 04/22/20 1138   haloperidol (HALDOL) tablet 2 mg  2 mg Oral Q6H PRN [name], [name], MD   2 mg at 04/12/20 2133   Or   haloperidol lactate (HALDOL) injection 1 mg  1 mg Intravenous Q6H PRN [name], [name], MD   1 mg at 04/17/20 0110   haloperidol (HALDOL) tablet 3 mg  3 mg Oral QHS [name], MD   3 mg at 04/21/20 2231   hydrALAZINE (APRESOLINE) tablet 50 mg  50 mg Oral Q6H PRN [name], [name], MD       lamoTRIgine (LAMICTAL) tablet 150 mg  150 mg Oral BID [name], [name], MD   150 mg at 04/22/20 1138   levETIRAcetam (KEPPRA) tablet 1,000 mg  1,000 mg Oral BID [name], [name], MD   1,000 mg at 04/21/20 1638   magnesium hydroxide (MILK OF MAGNESIA) suspension 30 mL  30 mL Oral Daily PRN [name], [name], MD       OLANZapine (ZYPREXA) injection 10 mg  10 mg Intramuscular Q6H PRN [name], [name], MD   10 mg at 04/17/20 0110   OLANZapine zydis (ZYPREXA) disintegrating tablet 12.5 mg  12.5 mg Oral QHS [name], MD   12.5 mg at 04/21/20 2231   ondansetron (ZOFRAN) tablet 4 mg  4 mg Oral Q6H PRN [name], [name], MD       Or   ondansetron (ZOFRAN) injection 4 mg  4 mg Intravenous Q6H PRN [name], [name], MD       thiamine tablet 100 mg  100 mg Oral Daily [name], [name], MD   100 mg at 04/22/20 1138   [START ON 04/23/2020] vitamin B-12 (CYANOCOBALAMIN) tablet 1,000 mcg  1,000 mcg Oral Daily [name], MD        Lab Results:  Results for orders placed or performed during the hospital encounter of 04/09/20 (from the past 48 hour(s))  Valproic acid level     Status: Abnormal   Collection Time: 04/21/20 12:06 PM  Result Value Ref Range   Valproic Acid Lvl <10 (L) 50.0 - 100.0 ug/mL     Comment: RESULT CONFIRMED BY MANUAL DILUTION KLW Performed at [employer]  [facility], [address], [place], [place] [address]     Blood Alcohol level:  Lab Results  Component Value Date   ETH <10 11/03/2019   ETH <5 12/05/2016    Metabolic Disorder Labs: No results found for: HGBA1C, MPG No results found for: PROLACTIN No results found for: CHOL, TRIG, HDL, CHOLHDL, VLDL, LDLCALC  Physical Findings: AIMS:  , ,  ,  ,    CIWA:    COWS:     Musculoskeletal: Strength & Muscle Tone: decreased Gait & Station: unsteady Patient leans: N/A  Psychiatric Specialty Exam: Physical Exam Vitals and nursing note reviewed.  HENT:     Head: Normocephalic and atraumatic.  Pulmonary:     Effort: Pulmonary effort is normal.  Neurological:     General: No focal deficit present.     Mental Status: He is alert.     Review of Systems  Blood pressure 117/79, pulse (!) 109, temperature 97.8 F (36.6 C), temperature source Oral, resp. rate 18, height 5\' 9"  (1.753 m), weight 62.6 kg, SpO2 100 %.Body mass index is 20.38 kg/m.  General Appearance: Disheveled  Eye Contact:  Minimal  Speech:  Garbled and Slow  Volume:  Decreased  Mood:  Euthymic  Affect:  Congruent  Thought Process:  Goal Directed and Descriptions of Associations: Circumstantial  Orientation:  Negative  Thought Content:  Negative  Suicidal Thoughts:  No  Homicidal Thoughts:  No  Memory:  Immediate;   Poor Recent;   Poor Remote;   Poor  Judgement:  Poor  Insight:  Fair  Psychomotor Activity:  Decreased  Concentration:  Concentration: Poor and Attention Span: Poor  Recall:  Poor  Fund of Knowledge:  Poor  Language:  Poor  Akathisia:  Negative  Handed:  Right  AIMS (if indicated):     Assets:  Desire for Improvement Resilience  ADL's:  Impaired  Cognition:  Impaired,  Moderate  Sleep:  Number of Hours: 7     Treatment Plan Summary: Daily contact with patient to assess and evaluate symptoms and progress in  treatment, Medication management and Plan : Patient is seen and examined.  [name] with the above-stated past psychiatric history who is seen in follow-up.  Diagnosis: 1. Dementia 2. Seizure disorder 3. Hypertension 4. Failure to thrive 5. Unstable gait 6. History of psychosis  Pertinent findings on examination today: 1.  Much more lethargic, and not eating well. 2.  Yesterday afternoon he was walking a bit better.  Plan: 1.  Stop Cogentin 1 mg 2. Continue clonazepam 0.5 mg p.o. twice daily, but will  Change to as needed. This is for anxiety. 3.  Continue Cymbalta 60 mg p.o. daily for anxiety and depression. 4. Continue Haldol 3 mg p.o. nightly for sleep, mood stability as well as psychosis. 5. Continue hydralazine 50 mg p.o. every 6 hours as needed for systolic blood pressure greater than 160. 6. Continue Lamictal 150 mg p.o. twice daily for seizure disorder. 7.Continue Keppra 1000 mg p.o. twice daily for seizure disorder. 8.  Decrease Zyprexa Zydis to 10 mg p.o. nightly for insomnia as well as psychosis. 9. Continue Zofran 4 mg p.o. every 6 hours as needed nausea. 10. Continue thiamine 100 mg p.o. daily for nutritional supplementation. 11.  Start Megace 40 mg p.o. daily for appetite stimulation. 12.  Disposition planning-in progress. 43, MD 04/22/2020, 11:41 AM

## 2020-04-23 DIAGNOSIS — R627 Adult failure to thrive: Secondary | ICD-10-CM | POA: Diagnosis not present

## 2020-04-23 DIAGNOSIS — F0281 Dementia in other diseases classified elsewhere with behavioral disturbance: Secondary | ICD-10-CM | POA: Diagnosis not present

## 2020-04-23 DIAGNOSIS — F29 Unspecified psychosis not due to a substance or known physiological condition: Secondary | ICD-10-CM | POA: Diagnosis not present

## 2020-04-23 DIAGNOSIS — F321 Major depressive disorder, single episode, moderate: Secondary | ICD-10-CM | POA: Diagnosis not present

## 2020-04-23 MED ORDER — OLANZAPINE 5 MG PO TBDP
15.0000 mg | ORAL_TABLET | Freq: Every day | ORAL | Status: DC
  Administered 2020-04-24 – 2020-04-30 (×7): 15 mg via ORAL
  Filled 2020-04-23 (×8): qty 3

## 2020-04-23 NOTE — Progress Notes (Addendum)
Pt is alert and oriented to person, and year, but not to place or situation.  Pt is calm, cooperative, denies suicidal and homicidal ideation, denies hallucinations, denies feelings of depression and anxiety. Pt is being monitored on a 1:1 with staff sitter for safety. Pt is impulsive and does not follow direction when asked to ask staff for help to the bathroom. Pt will impulsively jump up out of bed and became very unsteady, falls being prevented by 1:1 staff. Pt showered with assist of two staff. Pt is medication compliant with the exception of initially trying to in a confused way pour his water into his med cup. Pt's appetite is good. Pt naps at times. No distress noted, none reported, voices no complaints. Will continue to monitor pt per Q15 minute face checks and monitor for safety and progress. Pt is a high fall risk. Pt requires staff assist to safely transfer from bed to toilet or wheelchair.

## 2020-04-23 NOTE — Progress Notes (Signed)
[facility] MD Progress Note  04/23/2020 11:59 AM Lance Ray  MRN:  [id number] Subjective:  [name] with a past psychiatric history significant for epilepsy, alcohol dependence and concern for dementia who was admitted on 04/09/2020 for agitation and confusion.  Objective: Patient is seen and examined.  [name] with the above-stated past psychiatric history who is seen in follow-up.  Unfortunately secondary to his sedation yesterday I reduced some of the sedating medications.  Unfortunately he only slept 1 hour last night.  We discussed increasing his Zyprexa back up.  This morning he is alert and oriented x3.  He thought initially the Trump was still present, and I reminded him that it was Biden.  He is ambulating more frequently, and he appears to be getting more strength.  He did talk about the fact that he had been at [facility] previously for some unspecified amount of time.  He denied any auditory or visual hallucinations.  He denied any suicidal ideation.  His speech is still quite garbled.  His blood pressure is 117/79.  Pulse is 109.  He is afebrile.  Again he only slept 1 hour last night.  No new laboratories.  Principal Problem: Dementia (HCC) Diagnosis: Principal Problem:   Dementia (HCC) Active Problems:   Recurrent seizures (HCC)   Seizure disorder (HCC)   Psychosis (HCC)   Failure to thrive in adult   Stuttering   Depression   Anemia due to vitamin B12 deficiency  Total Time spent with patient: 20 minutes  Past Psychiatric History: See admission H&P  Past Medical History:  Past Medical History:  Diagnosis Date   Seizures (HCC)     Past Surgical History:  Procedure Laterality Date   SKIN GRAFT     Family History:  Family History  Problem Relation Age of Onset   Seizures Mother    Cerebral aneurysm Father    Family Psychiatric  History: See admission H&P Social History:  Social History   Substance and Sexual  Activity  Alcohol Use Yes   Alcohol/week: 2.0 standard drinks   Types: 2 Shots of liquor per week   Comment: "rarely"      Social History   Substance and Sexual Activity  Drug Use No    Social History   Socioeconomic History   Marital status: Single    Spouse name: Not on file   Number of children: Not on file   Years of education: Not on file   Highest education level: Not on file  Occupational History   Not on file  Tobacco Use   Smoking status: Current Some Day Smoker   Smokeless tobacco: Current User  Vaping Use   Vaping Use: Never used  Substance and Sexual Activity   Alcohol use: Yes    Alcohol/week: 2.0 standard drinks    Types: 2 Shots of liquor per week    Comment: "rarely"    Drug use: No   Sexual activity: Yes  Other Topics Concern   Not on file  Social History Narrative   Not on file   Social Determinants of Health   Financial Resource Strain:    Difficulty of Paying Living Expenses:   Food Insecurity:    Worried About Programme researcher, broadcasting/film/video in the Last Year:    Barista in the Last Year:   Transportation Needs:    Freight forwarder (Medical):    Lack of Transportation (Non-Medical):   Physical Activity:  Days of Exercise per Week:    Minutes of Exercise per Session:   Stress:    Feeling of Stress :   Social Connections:    Frequency of Communication with Friends and Family:    Frequency of Social Gatherings with Friends and Family:    Attends Religious Services:    Active Member of Clubs or Organizations:    Attends Banker Meetings:    Marital Status:    Additional Social History:                         Sleep: Poor  Appetite:  Poor  Current Medications: Current Facility-Administered Medications  Medication Dose Route Frequency Provider Last Rate Last Admin   acetaminophen (TYLENOL) tablet 650 mg  650 mg Oral Q6H PRN [name], [name], MD   650 mg at 04/20/20 1248    alum & mag hydroxide-simeth (MAALOX/MYLANTA) 200-200-20 MG/5ML suspension 30 mL  30 mL Oral Q4H PRN [name], [name], MD       clonazePAM (KLONOPIN) disintegrating tablet 0.5 mg  0.5 mg Oral BID PRN [name], MD       DULoxetine (CYMBALTA) DR capsule 60 mg  60 mg Oral Daily [name], MD   60 mg at 04/23/20 0820   haloperidol (HALDOL) tablet 2 mg  2 mg Oral Q6H PRN [name], [name], MD   2 mg at 04/23/20 2952   Or   haloperidol lactate (HALDOL) injection 1 mg  1 mg Intravenous Q6H PRN [name], [name], MD   1 mg at 04/17/20 0110   haloperidol (HALDOL) tablet 3 mg  3 mg Oral QHS [name], MD   3 mg at 04/22/20 2137   lamoTRIgine (LAMICTAL) tablet 150 mg  150 mg Oral BID [name], [name], MD   150 mg at 04/23/20 8413   levETIRAcetam (KEPPRA) tablet 1,000 mg  1,000 mg Oral BID [name], [name], MD   1,000 mg at 04/23/20 2440   magnesium hydroxide (MILK OF MAGNESIA) suspension 30 mL  30 mL Oral Daily PRN [name], [name], MD       megestrol (MEGACE) tablet 40 mg  40 mg Oral Daily [name], MD   40 mg at 04/23/20 1027   OLANZapine (ZYPREXA) injection 10 mg  10 mg Intramuscular Q6H PRN [name], [name], MD   10 mg at 04/17/20 0110   OLANZapine zydis (ZYPREXA) disintegrating tablet 15 mg  15 mg Oral QHS [name], MD       ondansetron [facility]) tablet 4 mg  4 mg Oral Q6H PRN [name], [name], MD       Or   ondansetron (ZOFRAN) injection 4 mg  4 mg Intravenous Q6H PRN [name], [name], MD       thiamine tablet 100 mg  100 mg Oral Daily [name], [name], MD   100 mg at 04/23/20 2536   vitamin B-12 (CYANOCOBALAMIN) tablet 1,000 mcg  1,000 mcg Oral Daily [name], MD   1,000 mcg at 04/23/20 6440    Lab Results:  Results for orders placed or performed during the hospital encounter of 04/09/20 (from the past 48 hour(s))  Valproic acid level     Status: Abnormal   Collection Time: 04/21/20 12:06 PM  Result Value Ref Range   Valproic Acid Lvl <10  (L) 50.0 - 100.0 ug/mL    Comment: RESULT CONFIRMED BY MANUAL DILUTION KLW Performed at [facility],  [address] [place], [place] [address]     Blood Alcohol level:  Lab Results  Component Value Date   ETH <10 11/03/2019   ETH <5 12/05/2016    Metabolic Disorder Labs: No results found for: HGBA1C, MPG No results found for: PROLACTIN No results found for: CHOL, TRIG, HDL, CHOLHDL, VLDL, LDLCALC  Physical Findings: AIMS:  , ,  ,  ,    CIWA:    COWS:     Musculoskeletal: Strength & Muscle Tone: decreased Gait & Station: unsteady Patient leans: N/A  Psychiatric Specialty Exam: Physical Exam Vitals and nursing note reviewed.  HENT:     Head: Normocephalic and atraumatic.  Pulmonary:     Effort: Pulmonary effort is normal.  Neurological:     Mental Status: He is alert and oriented to person, place, and time.     Review of Systems  Blood pressure 117/79, pulse (!) 109, temperature 97.8 F (36.6 C), temperature source Oral, resp. rate 18, height 5\' 9"  (1.753 m), weight 62.6 kg, SpO2 100 %.Body mass index is 20.38 kg/m.  General Appearance: Disheveled  Eye Contact:  Fair  Speech:  Garbled and Pressured  Volume:  Decreased  Mood:  Anxious and Dysphoric  Affect:  Flat  Thought Process:  Goal Directed and Descriptions of Associations: Circumstantial  Orientation:  Full (Time, Place, and Person)  Thought Content:  Rumination  Suicidal Thoughts:  No  Homicidal Thoughts:  No  Memory:  Immediate;   Fair Recent;   Fair Remote;   Fair  Judgement:  Impaired  Insight:  Fair  Psychomotor Activity:  Increased  Concentration:  Concentration: Fair and Attention Span: Fair  Recall:  Poor  Fund of Knowledge:  Fair  Language:  Fair  Akathisia:  Negative  Handed:  Right  AIMS (if indicated):     Assets:  Desire for Improvement Resilience  ADL's:  Impaired  Cognition:  Impaired,  Moderate  Sleep:  Number of Hours: 1     Treatment Plan Summary: Daily  contact with patient to assess and evaluate symptoms and progress in treatment, Medication management and Plan : Patient is seen and examined.  [name] with the above-stated past psychiatric history who is seen in follow-up.   Diagnosis: 1. Dementia 2. Seizure disorder 3. Hypertension 4. Failure to thrive 5. Unstable gait 6. History of psychosis  Pertinent findings on examination today: 1.  Motor strength seems to be improving. 2.  Appetite still not good, but has been started on Megace. 3.  With reduction and sedating medications he did not sleep well last night.  Plan: 1. Continue clonazepam 0.5 mg p.o. twice daily, but will  Change to as needed. This is for anxiety. 2.  Continue Cymbalta 60 mg p.o. daily for anxiety and depression. 3. Continue Haldol 3 mg p.o. nightly for sleep, mood stability as well as psychosis. 4. Continue hydralazine 50 mg p.o. every 5 hours as needed for systolic blood pressure greater than 160. 6. Continue Lamictal 150 mg p.o. twice daily for seizure disorder. 7.Continue Keppra 1000 mg p.o. twice daily for seizure disorder. 8.   Increase Zyprexa Zydis to 15 mg p.o. nightly for insomnia as well as psychosis. 9. Continue Zofran 4 mg p.o. every 6 hours as needed nausea. 10. Continue thiamine 100 mg p.o. daily for nutritional supplementation. 11.    Continue Megace 40 mg p.o. daily for appetite stimulation. 12.  Disposition planning-in progress. 43, MD 04/23/2020, 11:59 AM

## 2020-04-23 NOTE — Progress Notes (Addendum)
Recreation Therapy Notes  Date: 04/23/2020  Time: 9:30 am   Location: Court yard   Behavioral response: N/A   Intervention Topic: Social Skills   Discussion/Intervention: Patient did not attend group.   Clinical Observations/Feedback:  Patient did not attend group.   Shadia Larose LRT/CTRS          Jamaree Hosier 04/23/2020 11:57 AM

## 2020-04-23 NOTE — Progress Notes (Signed)
Patient on 1:1 for safety with a sitter present at bedside. Patient calm and cooperative this unit. Patient denies SI/HI/AVH, depression, anxiety, and pain. Patient compliant with medication administration per MD orders. Patient given education, support and encouragement to be active in his treatment plan.

## 2020-04-23 NOTE — Plan of Care (Signed)
Patient presents with a better affect than when this writer has previously seen him on the unit.   Problem: Education: Goal: Emotional status will improve Outcome: Progressing Goal: Mental status will improve Outcome: Progressing

## 2020-04-24 DIAGNOSIS — F321 Major depressive disorder, single episode, moderate: Secondary | ICD-10-CM | POA: Diagnosis not present

## 2020-04-24 DIAGNOSIS — R569 Unspecified convulsions: Secondary | ICD-10-CM

## 2020-04-24 DIAGNOSIS — F29 Unspecified psychosis not due to a substance or known physiological condition: Secondary | ICD-10-CM | POA: Diagnosis not present

## 2020-04-24 DIAGNOSIS — F0281 Dementia in other diseases classified elsewhere with behavioral disturbance: Secondary | ICD-10-CM | POA: Diagnosis not present

## 2020-04-24 MED ORDER — LEVETIRACETAM 100 MG/ML PO SOLN
1000.0000 mg | Freq: Once | ORAL | Status: AC
  Administered 2020-04-24: 1000 mg via ORAL
  Filled 2020-04-24: qty 10

## 2020-04-24 MED ORDER — LORAZEPAM 2 MG/ML IJ SOLN
INTRAMUSCULAR | Status: AC
  Filled 2020-04-24: qty 1

## 2020-04-24 MED ORDER — LORAZEPAM 2 MG/ML IJ SOLN
1.0000 mg | INTRAMUSCULAR | Status: AC
  Administered 2020-04-24: 1 mg via INTRAMUSCULAR

## 2020-04-24 MED ORDER — LEVETIRACETAM 500 MG PO TABS
1000.0000 mg | ORAL_TABLET | Freq: Once | ORAL | Status: DC
  Filled 2020-04-24: qty 2

## 2020-04-24 MED ORDER — LORAZEPAM 2 MG/ML IJ SOLN
1.0000 mg | Freq: Once | INTRAMUSCULAR | Status: AC
  Administered 2020-04-24: 1 mg via INTRAMUSCULAR

## 2020-04-24 MED ORDER — LORAZEPAM 2 MG/ML IJ SOLN
1.0000 mg | INTRAMUSCULAR | Status: DC | PRN
  Administered 2020-04-30: 1 mg via INTRAMUSCULAR
  Filled 2020-04-24: qty 1

## 2020-04-24 MED ORDER — LEVETIRACETAM 750 MG PO TABS
1500.0000 mg | ORAL_TABLET | Freq: Two times a day (BID) | ORAL | Status: DC
  Administered 2020-04-24 – 2020-04-30 (×12): 1500 mg via ORAL
  Filled 2020-04-24 (×14): qty 2

## 2020-04-24 NOTE — Consult Note (Signed)
Reason for Consult: seizure activity  Requesting Physician:  Dr. Lenon Ahmadi  CC: seizure activity HPI: Lance Ray is an 49 y.o. male  that has a previous psychiatric history of dementia, psychosis and depression and medical history of epilepsy, who presents in [facility] unit for treatment of dementia in the context of confusion and agitation. Prior stay on medical floor with withdrawal seizures in setting of ETOH use This AM had multiple generalized seizures.     Past Medical History:  Diagnosis Date  . Seizures (HCC)     Past Surgical History:  Procedure Laterality Date  . SKIN GRAFT      Family History  Problem Relation Age of Onset  . Seizures Mother   . Cerebral aneurysm Father     Social History:  reports that he has been smoking. He uses smokeless tobacco. He reports current alcohol use of about 2.0 standard drinks of alcohol per week. He reports that he does not use drugs.  No Known Allergies  Medications: I have reviewed the patient's current medications.  HEENT-  Normocephalic, no lesions, without obvious abnormality.  Normal external eye and conjunctiva  Neurological Examination   Mental Status: Alert, oriented, thought content appropriate.   Cranial Nerves: II: Discs flat bilaterally; Visual fields grossly normal, pupils equal, round, reactive to light and accommodation III,IV, VI: ptosis not present, extra-ocular motions intact bilaterally V,VII: smile symmetric, facial light touch sensation normal bilaterally VIII: hearing normal bilaterally IX,X: gag reflex present XI: bilateral shoulder shrug XII: midline tongue extension Motor: Right : Upper extremity   5/5    Left:     Upper extremity   5/5  Lower extremity   5/5     Lower extremity   5/5 Tone and bulk:normal tone throughout; no atrophy noted Sensory: Pinprick and light touch intact throughout, bilaterally Deep Tendon Reflexes: 1+ and symmetric throughout Plantars: Right: downgoing   Left:  downgoing Cerebellar: normal finger-to-nose       Laboratory Studies:   Basic Metabolic Panel: Recent Labs  Lab 04/20/20 0730  NA 141  K 4.3  CL 106  CO2 28  GLUCOSE 88  BUN 19  CREATININE 1.02  CALCIUM 9.0    Liver Function Tests: No results for input(s): AST, ALT, ALKPHOS, BILITOT, PROT, ALBUMIN in the last 168 hours. No results for input(s): LIPASE, AMYLASE in the last 168 hours. No results for input(s): AMMONIA in the last 168 hours.  CBC: Recent Labs  Lab 04/20/20 0730  WBC 4.9  HGB 14.2  HCT 43.6  MCV 90.6  PLT 285    Cardiac Enzymes: No results for input(s): CKTOTAL, CKMB, CKMBINDEX, TROPONINI in the last 168 hours.  BNP: Invalid input(s): POCBNP  CBG: No results for input(s): GLUCAP in the last 168 hours.  Microbiology: Results for orders placed or performed during the hospital encounter of 03/24/20  SARS Coronavirus 2 by RT PCR (hospital order, performed in [facility] hospital lab) Nasopharyngeal Nasopharyngeal Swab     Status: None   Collection Time: 03/25/20  4:13 PM   Specimen: Nasopharyngeal Swab  Result Value Ref Range Status   SARS Coronavirus 2 NEGATIVE NEGATIVE Final    Comment: (NOTE) SARS-CoV-2 target nucleic acids are NOT DETECTED.  The SARS-CoV-2 RNA is generally detectable in upper and lower respiratory specimens during the acute phase of infection. The lowest concentration of SARS-CoV-2 viral copies this assay can detect is 250 copies / mL. A negative result does not preclude SARS-CoV-2 infection and should not be used as  the sole basis for treatment or other patient management decisions.  A negative result may occur with improper specimen collection / handling, submission of specimen other than nasopharyngeal swab, presence of viral mutation(s) within the areas targeted by this assay, and inadequate number of viral copies (<250 copies / mL). A negative result must be combined with clinical observations, patient history, and  epidemiological information.  Fact Sheet for Patients:   BoilerBrush.com.cy  Fact Sheet for Healthcare Providers: https://pope.com/  This test is not yet approved or  cleared by the [place] FDA and has been authorized for detection and/or diagnosis of SARS-CoV-2 by FDA under an Emergency Use Authorization (EUA).  This EUA will remain in effect (meaning this test can be used) for the duration of the COVID-19 declaration under Section 564(b)(1) of the Act, 21 U.S.C. section 360bbb-3(b)(1), unless the authorization is terminated or revoked sooner.  Performed at [facility], [address] [place], [place] [address]   Culture, blood (Routine X 2) w Reflex to ID Panel     Status: None   Collection Time: 03/31/20 10:34 AM   Specimen: BLOOD  Result Value Ref Range Status   Specimen Description BLOOD BLOOD RIGHT ARM  Final   Special Requests   Final    BOTTLES DRAWN AEROBIC ONLY Blood Culture results may not be optimal due to an inadequate volume of blood received in culture bottles   Culture   Final    NO GROWTH 5 DAYS Performed at [facility], [address], [place], [place] [address]    Report Status 04/05/2020 FINAL  Final  Culture, blood (Routine X 2) w Reflex to ID Panel     Status: None   Collection Time: 03/31/20 10:41 AM   Specimen: BLOOD  Result Value Ref Range Status   Specimen Description BLOOD LEFT ANTECUBITAL  Final   Special Requests   Final    BOTTLES DRAWN AEROBIC AND ANAEROBIC Blood Culture adequate volume   Culture   Final    NO GROWTH 5 DAYS Performed at [facility], [address], [place], [place] [address]    Report Status 04/05/2020 FINAL  Final    Coagulation Studies: No results for input(s): LABPROT, INR in the last 72 hours.  Urinalysis: No results for input(s): COLORURINE, LABSPEC, PHURINE, GLUCOSEU, HGBUR, BILIRUBINUR, KETONESUR, PROTEINUR, UROBILINOGEN,  NITRITE, LEUKOCYTESUR in the last 168 hours.  Invalid input(s): APPERANCEUR  Lipid Panel:  No results found for: CHOL, TRIG, HDL, CHOLHDL, VLDL, LDLCALC  HgbA1C: No results found for: HGBA1C  Urine Drug Screen:      Component Value Date/Time   LABOPIA NONE DETECTED 05/27/2016 0200   COCAINSCRNUR NONE DETECTED 05/27/2016 0200   LABBENZ NONE DETECTED 05/27/2016 0200   AMPHETMU NONE DETECTED 05/27/2016 0200   THCU NONE DETECTED 05/27/2016 0200   LABBARB NONE DETECTED 05/27/2016 0200    Alcohol Level: No results for input(s): ETH in the last 168 hours.  Imaging: No results found.   Assessment/Plan:  49 y.o. male  that has a previous psychiatric history of dementia, psychosis and depression and medical history of epilepsy, who presents in [facility] unit for treatment of dementia in the context of confusion and agitation. Prior stay on medical floor with withdrawal seizures in setting of ETOH use This AM had multiple generalized seizures.    - Extra dose of zyprexa provoked? But it was only 5mg  - Lamictal will not increase and keep at 150 BID - Keppra increase to 1500mg  BID and 1g  load. States compliant with Keppra and no extra agitation - agree with ativan prn as needed for seizures.   04/24/2020, 1:45 PM

## 2020-04-24 NOTE — Progress Notes (Signed)
Patient developed another witnessed generalized seizure 1-minute duration. Lorazepam 1mg  IM administered. Patient gained consciousness briefly.  We put a new Neuro Consult request. The only psych med change from yesterday that can be possibly blamed - the dose of Zyprexa was increased by 5mg  QHS. Neuro consult requested for antiepileptic medication management.

## 2020-04-24 NOTE — Progress Notes (Signed)
CH attempted visit per Rapid Response in [facility]; [facility] informed by RT staff enroute that RR had been canceled.  CH remains available as needed.

## 2020-04-24 NOTE — Progress Notes (Addendum)
Pt is alert and oriented to name and to year but not to place or situation. Pt is calm, cooperative, reports feeling tired in the morning, slept in to this time, wakes and requests to eat, and took his medications. Pt is with a 1:1 sitter for safety, is a high fall risk, does not follow directions, impulsively jumps up out of bed and attempts to walk, but gait is unsteady and pt does not follow directions to notify staff prior to getting out of bed.   At [phone] pt had a seizure, MD and charge RN were nearby and called to see the patient immediately as the MHT sitting as the 1:1 secured pt safely on his side in his bed. Pt did not fall, as he was in his bed when seizure began. MD ordered stat IM Ativan 1mg  which was given. Pt was noted to be postictal with confusion which MD reports she was observing was a normal state afterwards. Pt fell asleep shortly afterwards. A rapid response was called and canceled due to pt being under control medically and the seizure ending, and being treated successfully by the team.   Will continue to monitor pt per Q15 minute face checks and monitor for safety and progress, and will continue to monitor pt on a 1:1 with staff for safety.

## 2020-04-24 NOTE — Progress Notes (Signed)
Patient had one episode of seizure started approx 8:47am and lasted for about 3 minutes. Patient given Lorazepam 1mg  IM once to stop the episode. Vital signs were taken during the episodes and were BP 165/100, HS 111, pO2 100%. Rapid response called, then cancelled due to improvement. Patient was observed and was placed on his side to prevent the aspiration. Lung auscultation was clear throughout and after the episode. Patient is currently still confused, but responsive and answers orientation questions correctly. He is on 1:1 constant observation.

## 2020-04-24 NOTE — [facility] Group Notes (Signed)
LCSW Group Therapy Note  04/24/2020   10:00-11:00am   Type of Therapy and Topic:  Group Therapy: Anger Cues and Responses  Participation Level:  Did Not Attend   Description of Group:   In this group, patients learned how to recognize the physical, cognitive, emotional, and behavioral responses they have to anger-provoking situations.  They identified a recent time they became angry and how they reacted.  They analyzed how their reaction was possibly beneficial and how it was possibly unhelpful.  The group discussed a variety of healthier coping skills that could help with such a situation in the future.  Focus was placed on how helpful it is to recognize the underlying emotions to our anger, because working on those can lead to a more permanent solution as well as our ability to focus on the important rather than the urgent.  Therapeutic Goals: 1. Patients will remember their last incident of anger and how they felt emotionally and physically, what their thoughts were at the time, and how they behaved. 2. Patients will identify how their behavior at that time worked for them, as well as how it worked against them. 3. Patients will explore possible new behaviors to use in future anger situations. 4. Patients will learn that anger itself is normal and cannot be eliminated, and that healthier reactions can assist with resolving conflict rather than worsening situations.  Summary of Patient Progress:  The patient did not attend.  Therapeutic Modalities:   Cognitive Behavioral Therapy  Valentin Benney D Myonna Chisom    

## 2020-04-24 NOTE — Progress Notes (Signed)
[facility] MD Progress Note  04/24/2020 10:39 AM JEYDEN COFFELT  MRN:  [id number]  Principal Problem: Dementia (HCC) Diagnosis: Principal Problem:   Dementia (HCC) Active Problems:   Recurrent seizures (HCC)   Seizure disorder (HCC)   Psychosis (HCC)   Failure to thrive in adult   Stuttering   Depression   Anemia due to vitamin B12 deficiency  Total Time spent with patient: 30 minutes  Mr. Papillion is a 49 y.o. male patient that has a previous psychiatric history of dementia, psychosis and depression and medical history of epilepsy, who presents in [facility] unit for treatment of dementia in the context of confusion and agitation.   Interval History Patient was seen today for re-evaluation.  Nursing reports no events overnight.   The patient reports no issues with performing ADLs.  Patient has been medication compliant.  The patient reports no side effects from medications. Since last assessment, patient reports symptoms have stayed the same.    This morning patient developed an episode of seizure started approx 8:47am and lasted for about 3 minutes. Patient given Lorazepam 1mg  IM once to stop the episode. Vital signs were taken during the episodes and were BP 165/100, HS 111, pO2 100%. Rapid response called, then cancelled due to improvement. Patient was observed and was placed on his side to prevent the aspiration. Lung auscultation was clear throughout and after the episode. He is on 1:1 constant observation.    SUBJECTIVE: On assessment at 10am patient reports feeling "alright". He is alert and oriented to self, place, date. He does not remember having seizures, but reports some back muscle discomfort like he usually has in postictal period. E reports "okay" mood. He denies having thougfhts of harming self or others. He denies any hallucinations, does not express delusions. Reports he slept well last night. He ate second breakfast due to vomiting his first breakfast out during seizures and feeling  hungry after.  He denies side effects from medications.  Current suicidal/homicidal ideations: Denies Current auditory/visual hallucinations: Denies  Review Of Systems: A complete review of systems of the following systems was conducted (Constitutional, Psychiatric, Neurological, Musculoskeletal, Eyes, Gastrointestinal, Cardiovascular, Respiratory, Skin, and Endocrine). All reviewed systems are negative except pertinent positives identified in the HPI.  Labs: No new labs to review.   Past Psychiatric History: see H&P  Past Medical History:  Past Medical History:  Diagnosis Date  . Seizures (HCC)     Past Surgical History:  Procedure Laterality Date  . SKIN GRAFT     Family History:  Family History  Problem Relation Age of Onset  . Seizures Mother   . Cerebral aneurysm Father    Family Psychiatric  History: see H&P Social History:  Social History   Substance and Sexual Activity  Alcohol Use Yes  . Alcohol/week: 2.0 standard drinks  . Types: 2 Shots of liquor per week   Comment: "rarely"      Social History   Substance and Sexual Activity  Drug Use No    Social History   Socioeconomic History  . Marital status: Single    Spouse name: Not on file  . Number of children: Not on file  . Years of education: Not on file  . Highest education level: Not on file  Occupational History  . Not on file  Tobacco Use  . Smoking status: Current Some Day Smoker  . Smokeless tobacco: Current User  Vaping Use  . Vaping Use: Never used  Substance and Sexual Activity  .  Alcohol use: Yes    Alcohol/week: 2.0 standard drinks    Types: 2 Shots of liquor per week    Comment: "rarely"   . Drug use: No  . Sexual activity: Yes  Other Topics Concern  . Not on file  Social History Narrative  . Not on file   Social Determinants of Health   Financial Resource Strain:   . Difficulty of Paying Living Expenses:   Food Insecurity:   . Worried About Programme researcher, broadcasting/film/videounning Out of Food in the  Last Year:   . Baristaan Out of Food in the Last Year:   Transportation Needs:   . Freight forwarderLack of Transportation (Medical):   [name]. Lack of Transportation (Non-Medical):   Physical Activity:   . Days of Exercise per Week:   . Minutes of Exercise per Session:   Stress:   . Feeling of Stress :   Social Connections:   . Frequency of Communication with Friends and Family:   . Frequency of Social Gatherings with Friends and Family:   . Attends Religious Services:   . Active Member of Clubs or Organizations:   . Attends BankerClub or Organization Meetings:   [name]. Marital Status:    Additional Social History:    Sleep: Fair  Appetite:  Fair  Current Medications: Current Facility-Administered Medications  Medication Dose Route Frequency Provider Last Rate Last Admin  . LORazepam (ATIVAN) 2 MG/ML injection           . acetaminophen (TYLENOL) tablet 650 mg  650 mg Oral Q6H PRN [name], Jackquline DenmarkJohn T, MD   650 mg at 04/20/20 1248  . alum & mag hydroxide-simeth (MAALOX/MYLANTA) 200-200-20 MG/5ML suspension 30 mL  30 mL Oral Q4H PRN [name], [name], MD      . clonazePAM (KLONOPIN) disintegrating tablet 0.5 mg  0.5 mg Oral BID PRN Antonieta Pertlary, Greg Lawson, MD      . DULoxetine (CYMBALTA) DR capsule 60 mg  60 mg Oral Daily Antonieta Pertlary, Greg Lawson, MD   60 mg at 04/24/20 0747  . haloperidol (HALDOL) tablet 2 mg  2 mg Oral Q6H PRN [name], Jackquline DenmarkJohn T, MD   2 mg at 04/23/20 [id number]   Or  . haloperidol lactate (HALDOL) injection 1 mg  1 mg Intravenous Q6H PRN [name], Jackquline DenmarkJohn T, MD   1 mg at 04/17/20 0110  . haloperidol (HALDOL) tablet 3 mg  3 mg Oral QHS Roselind Messierao, Ramakrishna, MD   3 mg at 04/23/20 2130  . lamoTRIgine (LAMICTAL) tablet 150 mg  150 mg Oral BID [name], Jackquline DenmarkJohn T, MD   150 mg at 04/24/20 0747  . levETIRAcetam (KEPPRA) tablet 1,000 mg  1,000 mg Oral BID [name], Jackquline DenmarkJohn T, MD   1,000 mg at 04/24/20 0747  . magnesium hydroxide (MILK OF MAGNESIA) suspension 30 mL  30 mL Oral Daily PRN [name], [name], MD      . megestrol (MEGACE) tablet 40 mg  40  mg Oral Daily Antonieta Pertlary, Greg Lawson, MD   40 mg at 04/24/20 0747  . OLANZapine (ZYPREXA) injection 10 mg  10 mg Intramuscular Q6H PRN [name], Jackquline DenmarkJohn T, MD   10 mg at 04/17/20 0110  . OLANZapine zydis (ZYPREXA) disintegrating tablet 15 mg  15 mg Oral QHS Antonieta Pertlary, Greg Lawson, MD      . ondansetron [facility](ZOFRAN) tablet 4 mg  4 mg Oral Q6H PRN [name], [name], MD       Or  . ondansetron (ZOFRAN) injection 4 mg  4 mg Intravenous Q6H PRN [name], [name],  MD      . thiamine tablet 100 mg  100 mg Oral Daily [name], [name], MD   100 mg at 04/24/20 0747  . vitamin B-12 (CYANOCOBALAMIN) tablet 1,000 mcg  1,000 mcg Oral Daily [name], MD   1,000 mcg at 04/24/20 7591    Lab Results: No results found for this or any previous visit (from the past 48 hour(s)).  Blood Alcohol level:  Lab Results  Component Value Date   ETH <10 11/03/2019   ETH <5 12/05/2016    Metabolic Disorder Labs: No results found for: HGBA1C, MPG No results found for: PROLACTIN No results found for: CHOL, TRIG, HDL, CHOLHDL, VLDL, LDLCALC  Physical Findings: AIMS:  , ,  ,  ,    CIWA:    COWS:     Musculoskeletal: Strength & Muscle Tone: within normal limits Gait & Station: unsteady Patient leans: N/A  Psychiatric Specialty Exam: Physical Exam  Review of Systems  Respiratory: Negative for cough, choking, chest tightness and shortness of breath.   Cardiovascular: Negative for chest pain.  Gastrointestinal: Negative for abdominal pain, constipation and diarrhea.  Musculoskeletal: Negative for back pain and neck pain.  Neurological: Positive for seizures. Negative for numbness and headaches.  Psychiatric/Behavioral: Negative for agitation, behavioral problems, dysphoric mood, hallucinations, self-injury, sleep disturbance and suicidal ideas. The patient is not nervous/anxious.     Blood pressure (!) 165/88, pulse (!) 111, temperature 98.4 F (36.9 C), temperature source Oral, resp. rate 18, height 5\' 9"  (1.753 m), weight  62.6 kg, SpO2 100 %.Body mass index is 20.38 kg/m.  General Appearance: Disheveled  Eye Contact:  Fair  Speech:  Garbled  Volume:  Decreased  Mood:  Euthymic  Affect:  Congruent and Constricted  Thought Process:  Coherent  Orientation:  Full (Time, Place, and Person)  Thought Content:  Logical  Suicidal Thoughts:  No  Homicidal Thoughts:  No  Memory:  Immediate;   Fair Recent;   Poor Remote;   NA  Judgement:  Fair  Insight:  Shallow  Psychomotor Activity:  Normal  Concentration:  Concentration: Fair and Attention Span: Fair  Recall:  of Knowledge:  Fair  Language:  Fair  Akathisia:  No  Handed:  Right  AIMS (if indicated):     Assets:  Desire for Improvement  ADL's:  Intact  Cognition:  Impaired,  Mild  Sleep:  Number of Hours: 7     Treatment Plan Summary: Daily contact with patient to assess and evaluate symptoms and progress in treatment and Medication management   Mr. Detter is a 49 y.o. male patient that has a previous psychiatric history of dementia, psychosis and depression and medical history of epilepsy, who presents in [facility] unit for treatment of dementia in the context of confusion and agitation.  No changes in patient mood and symptoms today - mildly-confused, not depressed, not suicidal, not homicidal, no psychosis. Had one episode of seizure this morning. Patient is awaiting placement. No medication changes made today: will continue Cymbalta 60mg /day for depression, anxiety; Haldol 3mg  QHS and Zyprexa 15mg  QHS for psychosis; Lamictal 150mg  PO BID for mood stability and seizure proph; Keppra 1000mg  PO BID for seizures. Thiamine, Vit B12, Megestrol.     Plan: -continue inpatient psych admission; 1:1 observation and 15-minute checks; daily contact with patient to assess and evaluate symptoms and progress in treatment; psychoeducation.  -continue scheduled medications: . LORazepam      . DULoxetine  60 mg Oral Daily  .  haloperidol  3 mg Oral QHS  .  lamoTRIgine  150 mg Oral BID  . levETIRAcetam  1,000 mg Oral BID  . megestrol  40 mg Oral Daily  . OLANZapine zydis  15 mg Oral QHS  . thiamine  100 mg Oral Daily  . vitamin B-12  1,000 mcg Oral Daily    -continue PRN medications.  acetaminophen, alum & mag hydroxide-simeth, clonazePAM, haloperidol **OR** haloperidol lactate, magnesium hydroxide, OLANZapine, ondansetron **OR** ondansetron (ZOFRAN) IV  -Disposition: SW is working on placement.  Thalia Party, MD 04/24/2020, 10:39 AM

## 2020-04-25 DIAGNOSIS — F321 Major depressive disorder, single episode, moderate: Secondary | ICD-10-CM | POA: Diagnosis not present

## 2020-04-25 DIAGNOSIS — F0281 Dementia in other diseases classified elsewhere with behavioral disturbance: Secondary | ICD-10-CM | POA: Diagnosis not present

## 2020-04-25 DIAGNOSIS — F29 Unspecified psychosis not due to a substance or known physiological condition: Secondary | ICD-10-CM | POA: Diagnosis not present

## 2020-04-25 NOTE — Plan of Care (Signed)
  Problem: Education: Goal: Knowledge of [facility] Education information/materials will improve Outcome: Progressing Goal: Emotional status will improve Outcome: Progressing Goal: Mental status will improve Outcome: Progressing Goal: Verbalization of understanding the information provided will improve Outcome: Progressing   Problem: Health Behavior/Discharge Planning: Goal: Identification of resources available to assist in meeting health care needs will improve Outcome: Progressing Goal: Compliance with treatment plan for underlying cause of condition will improve Outcome: Progressing   Problem: Physical Regulation: Goal: Ability to maintain clinical measurements within normal limits will improve Outcome: Progressing   Problem: Safety: Goal: Periods of time without injury will increase Outcome: Progressing   Problem: Education: Goal: Verbalization of understanding the information provided will improve Outcome: Progressing   Problem: Coping: Goal: Ability to demonstrate appropriate behavior when angry will improve Outcome: Progressing Goal: Ability to identify and develop effective coping behavior will improve Outcome: Progressing Goal: Ability to interact with others will improve Outcome: Progressing   

## 2020-04-25 NOTE — [facility] Group Notes (Signed)
[facility] LCSW Group Therapy Note  Date/Time:  04/25/2020 1315  Type of Therapy and Topic:  Group Therapy:  Healthy and Unhealthy Supports  Participation Level:  Minimal   Description of Group:  Patients in this group were introduced to the idea of adding a variety of healthy supports to address the various needs in their lives.Patients discussed what additional healthy supports could be helpful in their recovery and wellness after discharge in order to prevent future hospitalizations.   An emphasis was placed on using counselor, doctor, therapy groups, 12-step groups, and problem-specific support groups to expand supports.  They also worked as a group on developing a specific plan for several patients to deal with unhealthy supports through boundary-setting, psychoeducation with loved ones, and even termination of relationships.   Therapeutic Goals:   1)  discuss importance of adding supports to stay well once out of the hospital  2)  compare healthy versus unhealthy supports and identify some examples of each  3)  generate ideas and descriptions of healthy supports that can be added  4)  offer mutual support about how to address unhealthy supports  5)  encourage active participation in and adherence to discharge plan    Summary of Patient Progress:  The patient actively engaged in introductory check-in, sharing of feeling "Between a 5,6,7, and 8, maybe a 7.5; now that I'm getting able to walk again". Pt shared his personal definition of support to include the support he received from his father prior to his passing. Pt did not prove to identify specific healthy supports in his life, however acknowledged the importance of keeping up with doctors appointments for various medical and mental health treatment to be beneficial to him. Pt proved receptive to alternate group members input and feedback from CSW.   Therapeutic Modalities:   Motivational Interviewing Brief Solution-Focused  Therapy  Leisa Lenz, LCSW 04/25/2020  2:51 PM

## 2020-04-25 NOTE — Progress Notes (Signed)
Patient remains on 1:1 observation for safety and recent seizure activity. He has been stable up to this point and is active in the milieu watching TV with other peers on the unit.  He denies SI/HI/AVH depression and anxiety at this encounter. He received his prescribed meds and tolerated without incident.  He remains safe on the unit with observation and 15 minute safety checks.  Cleo Butler-Nicholson, LPN

## 2020-04-25 NOTE — Progress Notes (Signed)
Pt is alert and oriented to person and year but not to place and situation. Pt denies suicidal and homicidal ideation, denies hallucinations, denies feelings of depression and anxiety. Pt has been spending a great deal of time watching tv in the dayroom, has a good appetite, denies pain, no seizure activity noted today, pt is medication complaint. Pt is monitored on a 1:1 with staff sitter for safety due to unsteady gait and not able to follow directions to ask for help instead impulsively will attempt to walk on his own when pt is not able to without assist, a walker and gait belt, or use of a wheel chair. Pt is calm, pleasant, denies pain, no distress noted, none reported. Will continue to monitor pt per Q15 minute face checks, 1:1 sitter, and for safety and progress.

## 2020-04-25 NOTE — Progress Notes (Signed)
[facility] MD Progress Note  04/25/2020 11:20 AM ALTA SHOBER  MRN:  [id number]  Principal Problem: Dementia (HCC) Diagnosis: Principal Problem:   Dementia (HCC) Active Problems:   Recurrent seizures (HCC)   Seizure disorder (HCC)   Psychosis (HCC)   Failure to thrive in adult   Stuttering   Depression   Anemia due to vitamin B12 deficiency  Total Time spent with patient: 20 min  Mr. Dilks is a 49 y.o. male patient that has a previous psychiatric history of dementia, psychosis and depression and medical history of epilepsy, who presents in [facility] unit for treatment of dementia in the context of confusion and agitation.   Interval History Patient was seen today for re-evaluation.  Nursing reports no events overnight.   The patient reports no issues with performing ADLs.  Patient has been medication compliant.  The patient reports no side effects from medications. Since last assessment, patient reports symptoms have stayed the same.    No episode of seizure since yesterday.    SUBJECTIVE: On assessment patient reports feeling "good, same". He is alert and oriented to self, place, date. He reports "alright" mood, denies feeling depressed, denies having thougfhts of harming self or others. He denies any hallucinations, does not express delusions. Reports he slept well last night. He denies side effects from medications. He is awaiting placement.  Current suicidal/homicidal ideations: Denies Current auditory/visual hallucinations: Denies  Review Of Systems: A complete review of systems of the following systems was conducted (Constitutional, Psychiatric, Neurological, Musculoskeletal, Eyes, Gastrointestinal, Cardiovascular, Respiratory, Skin, and Endocrine). All reviewed systems are negative except pertinent positives identified in the HPI.  Labs: VPA < 10   Past Psychiatric History: see H&P  Past Medical History:  Past Medical History:  Diagnosis Date  . Seizures (HCC)     Past  Surgical History:  Procedure Laterality Date  . SKIN GRAFT     Family History:  Family History  Problem Relation Age of Onset  . Seizures Mother   . Cerebral aneurysm Father    Family Psychiatric  History: see H&P Social History:  Social History   Substance and Sexual Activity  Alcohol Use Yes  . Alcohol/week: 2.0 standard drinks  . Types: 2 Shots of liquor per week   Comment: "rarely"      Social History   Substance and Sexual Activity  Drug Use No    Social History   Socioeconomic History  . Marital status: Single    Spouse name: Not on file  . Number of children: Not on file  . Years of education: Not on file  . Highest education level: Not on file  Occupational History  . Not on file  Tobacco Use  . Smoking status: Current Some Day Smoker  . Smokeless tobacco: Current User  Vaping Use  . Vaping Use: Never used  Substance and Sexual Activity  . Alcohol use: Yes    Alcohol/week: 2.0 standard drinks    Types: 2 Shots of liquor per week    Comment: "rarely"   . Drug use: No  . Sexual activity: Yes  Other Topics Concern  . Not on file  Social History Narrative  . Not on file   Social Determinants of Health   Financial Resource Strain:   . Difficulty of Paying Living Expenses:   Food Insecurity:   . Worried About Programme researcher, broadcasting/film/video in the Last Year:   . Barista in the Last Year:   Transportation Needs:   .  Lack of Transportation (Medical):   [name] Lack of Transportation (Non-Medical):   Physical Activity:   . Days of Exercise per Week:   . Minutes of Exercise per Session:   Stress:   . Feeling of Stress :   Social Connections:   . Frequency of Communication with Friends and Family:   . Frequency of Social Gatherings with Friends and Family:   . Attends Religious Services:   . Active Member of Clubs or Organizations:   . Attends Banker Meetings:   [name] Marital Status:    Additional Social History:    Sleep: Fair  Appetite:   Fair  Current Medications: Current Facility-Administered Medications  Medication Dose Route Frequency Provider Last Rate Last Admin  . acetaminophen (TYLENOL) tablet 650 mg  650 mg Oral Q6H PRN [name], [name], MD   650 mg at 04/20/20 1248  . alum & mag hydroxide-simeth (MAALOX/MYLANTA) 200-200-20 MG/5ML suspension 30 mL  30 mL Oral Q4H PRN [name], [name], MD      . DULoxetine (CYMBALTA) DR capsule 60 mg  60 mg Oral Daily [name], MD   60 mg at 04/25/20 0753  . haloperidol (HALDOL) tablet 2 mg  2 mg Oral Q6H PRN [name], [name], MD   2 mg at 04/23/20 5102   Or  . haloperidol lactate (HALDOL) injection 1 mg  1 mg Intravenous Q6H PRN [name], [name], MD   1 mg at 04/17/20 0110  . haloperidol (HALDOL) tablet 3 mg  3 mg Oral QHS [name], MD   3 mg at 04/24/20 2157  . lamoTRIgine (LAMICTAL) tablet 150 mg  150 mg Oral BID [name], [name], MD   150 mg at 04/25/20 0753  . levETIRAcetam (KEPPRA) tablet 1,500 mg  1,500 mg Oral Q12H Pauletta Browns, MD   1,500 mg at 04/25/20 0753  . LORazepam (ATIVAN) injection 1 mg  1 mg Intramuscular PRN Evgenia Merriman, Serina Cowper, MD      . magnesium hydroxide (MILK OF MAGNESIA) suspension 30 mL  30 mL Oral Daily PRN [name], [name], MD      . megestrol (MEGACE) tablet 40 mg  40 mg Oral Daily [name], MD   40 mg at 04/25/20 0754  . OLANZapine (ZYPREXA) injection 10 mg  10 mg Intramuscular Q6H PRN [name], [name], MD   10 mg at 04/17/20 0110  . OLANZapine zydis (ZYPREXA) disintegrating tablet 15 mg  15 mg Oral QHS [name], MD   15 mg at 04/24/20 2158  . ondansetron (ZOFRAN) tablet 4 mg  4 mg Oral Q6H PRN [name], [name], MD       Or  . ondansetron (ZOFRAN) injection 4 mg  4 mg Intravenous Q6H PRN [name], [name], MD      . thiamine tablet 100 mg  100 mg Oral Daily [name], [name], MD   100 mg at 04/25/20 0754  . vitamin B-12 (CYANOCOBALAMIN) tablet 1,000 mcg  1,000 mcg Oral Daily [name], MD   1,000 mcg at 04/25/20 5852    Lab  Results: No results found for this or any previous visit (from the past 48 hour(s)).  Blood Alcohol level:  Lab Results  Component Value Date   ETH <10 11/03/2019   ETH <5 12/05/2016    Metabolic Disorder Labs: No results found for: HGBA1C, MPG No results found for: PROLACTIN No results found for: CHOL, TRIG, HDL, CHOLHDL, VLDL, LDLCALC  Physical Findings: AIMS:  , ,  ,  ,  CIWA:    COWS:     Musculoskeletal: Strength & Muscle Tone: within normal limits Gait & Station: unsteady Patient leans: N/A  Psychiatric Specialty Exam:     Blood pressure 121/73, pulse 86, temperature 98.2 F (36.8 C), temperature source Oral, resp. rate 18, height 5\' 9"  (1.753 m), weight 62.6 kg, SpO2 98 %.Body mass index is 20.38 kg/m.  General Appearance: Disheveled  Eye Contact:  Fair  Speech:  Garbled  Volume:  Decreased  Mood:  Euthymic  Affect:  Congruent and Constricted  Thought Process:  Coherent  Orientation:  Full (Time, Place, and Person)  Thought Content:  Logical  Suicidal Thoughts:  No  Homicidal Thoughts:  No  Memory:  Immediate;   Fair Recent;   Poor Remote;   NA  Judgement:  Fair  Insight:  Shallow  Psychomotor Activity:  Normal  Concentration:  Concentration: Fair and Attention Span: Fair  Recall:  of Knowledge:  Fair  Language:  Fair  Akathisia:  No  Handed:  Right  AIMS (if indicated):     Assets:  Desire for Improvement  ADL's:  Intact  Cognition:  Impaired,  Mild  Sleep:  Number of Hours: 4.5     Treatment Plan Summary: Daily contact with patient to assess and evaluate symptoms and progress in treatment and Medication management   Mr. Freiermuth is a 49 y.o. male patient that has a previous psychiatric history of dementia, psychosis and depression and medical history of epilepsy, who presents in [facility] unit for treatment of dementia in the context of confusion and agitation.  No changes in patient mood and symptoms today - mildly-confused, not  depressed, not suicidal, not homicidal, no psychosis. Had no episodes of seizure since yesterday. Patient is awaiting placement.  No medication changes made today: will continue Cymbalta 60mg /day for depression, anxiety; Haldol 3mg  QHS and Zyprexa 15mg  QHS for psychosis; His Lamictal remained unchanged at 150mg  PO BID for mood stability and seizure proph; neurology increase his Keppra yesterday to 1500mg  PO BID for seizures.  Continue Thiamine, Vit B12, Megestrol.     Plan: -continue inpatient psych admission; 1:1 observation and 15-minute checks; daily contact with patient to assess and evaluate symptoms and progress in treatment; psychoeducation.  -continue scheduled medications: . DULoxetine  60 mg Oral Daily  . haloperidol  3 mg Oral QHS  . lamoTRIgine  150 mg Oral BID  . levETIRAcetam  1,500 mg Oral Q12H  . megestrol  40 mg Oral Daily  . OLANZapine zydis  15 mg Oral QHS  . thiamine  100 mg Oral Daily  . vitamin B-12  1,000 mcg Oral Daily    -continue PRN medications.  acetaminophen, alum & mag hydroxide-simeth, haloperidol **OR** haloperidol lactate, LORazepam, magnesium hydroxide, OLANZapine, ondansetron **OR** ondansetron (ZOFRAN) IV  -Disposition: SW is working on placement.  [facility], MD 04/25/2020, 11:20 AM

## 2020-04-26 DIAGNOSIS — F1027 Alcohol dependence with alcohol-induced persisting dementia: Secondary | ICD-10-CM
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
## 2020-04-26 NOTE — Progress Notes (Signed)
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  Patient seen chart reviewed.  Patient was sleepy and difficult to arouse.  Earlier in the day he had gotten up and eaten on his own.  He made an attempt to open his eyes and answered a few questions.  He was aware that he was in the hospital in [place].  Most other answers were very disorganized and scattered and often trailed off into mumbling.  Patient had seizures as recently as the last couple days and has had an increase in some of his antiseizure medicine Principal Problem: Dementia (HCC) Diagnosis: Principal Problem:   Dementia (HCC) Active Problems:   Recurrent seizures (HCC)   Seizure disorder (HCC)   Psychosis (HCC)   Failure to thrive in adult   Stuttering   Depression   Anemia due to vitamin B12 deficiency  Total Time spent with patient: 30 minutes  Past Psychiatric History: Past history of alcohol abuse  Past Medical History:  Past Medical History:  Diagnosis Date  . Seizures (HCC)     Past Surgical History:  Procedure Laterality Date  . SKIN GRAFT     Family History:  Family History  Problem Relation Age of Onset  . Seizures Mother   . Cerebral aneurysm Father    Family Psychiatric  History: See previous Social History:  Social History   Substance and Sexual Activity  Alcohol Use Yes  . Alcohol/week: 2.0 standard drinks  . Types: 2 Shots of liquor per week   Comment: "rarely"      Social History   Substance and Sexual Activity  Drug Use No    Social History   Socioeconomic History  . Marital status: Single    Spouse name: Not on file  . Number of children: Not on file  . Years of education: Not on file  . Highest education level: Not on file  Occupational History  . Not on file  Tobacco Use  . Smoking status: Current Some Day Smoker  . Smokeless tobacco:  Current User  Vaping Use  . Vaping Use: Never used  Substance and Sexual Activity  . Alcohol use: Yes    Alcohol/week: 2.0 standard drinks    Types: 2 Shots of liquor per week    Comment: "rarely"   . Drug use: No  . Sexual activity: Yes  Other Topics Concern  . Not on file  Social History Narrative  . Not on file   Social Determinants of Health   Financial Resource Strain:   . Difficulty of Paying Living Expenses:   Food Insecurity:   . Worried About Programme researcher, broadcasting/film/video in the Last Year:   . Barista in the Last Year:   Transportation Needs:   . Freight forwarder (Medical):   [name] Lack of Transportation (Non-Medical):   Physical Activity:   . Days of Exercise per Week:   . Minutes of Exercise per Session:   Stress:   . Feeling of Stress :   Social Connections:   . Frequency of Communication with Friends and Family:   . Frequency of Social Gatherings with Friends and Family:   . Attends Religious Services:   . Active Member of Clubs or Organizations:   . Attends Banker Meetings:   [name] Marital Status:    Additional Social History:  Sleep: Fair  Appetite:  Fair  Current Medications: Current Facility-Administered Medications  Medication Dose Route Frequency Provider Last Rate Last Admin  . acetaminophen (TYLENOL) tablet 650 mg  650 mg Oral Q6H PRN Navneet Schmuck, [name], MD   650 mg at 04/20/20 1248  . alum & mag hydroxide-simeth (MAALOX/MYLANTA) 200-200-20 MG/5ML suspension 30 mL  30 mL Oral Q4H PRN Sundus Pete T, MD      . haloperidol (HALDOL) tablet 2 mg  2 mg Oral Q6H PRN Caylynn Minchew T, MD   2 mg at 04/23/20 2878   Or  . haloperidol lactate (HALDOL) injection 1 mg  1 mg Intravenous Q6H PRN Zanyia Silbaugh, [name], MD   1 mg at 04/17/20 0110  . haloperidol (HALDOL) tablet 3 mg  3 mg Oral QHS [name], MD   3 mg at 04/25/20 2159  . lamoTRIgine (LAMICTAL) tablet 150 mg  150 mg Oral BID Samirah Scarpati, [name], MD   150 mg  at 04/26/20 1734  . levETIRAcetam (KEPPRA) tablet 1,500 mg  1,500 mg Oral Q12H Pauletta Browns, MD   1,500 mg at 04/26/20 0816  . LORazepam (ATIVAN) injection 1 mg  1 mg Intramuscular PRN Paliy, Serina Cowper, MD      . magnesium hydroxide (MILK OF MAGNESIA) suspension 30 mL  30 mL Oral Daily PRN Reshawn Ostlund T, MD      . megestrol (MEGACE) tablet 40 mg  40 mg Oral Daily [name], MD   40 mg at 04/26/20 0817  . OLANZapine (ZYPREXA) injection 10 mg  10 mg Intramuscular Q6H PRN Gizzelle Lacomb, [name], MD   10 mg at 04/17/20 0110  . OLANZapine zydis (ZYPREXA) disintegrating tablet 15 mg  15 mg Oral QHS [name], MD   15 mg at 04/25/20 2203  . ondansetron (ZOFRAN) tablet 4 mg  4 mg Oral Q6H PRN Arav Bannister T, MD       Or  . ondansetron (ZOFRAN) injection 4 mg  4 mg Intravenous Q6H PRN Deaglan Lile T, MD      . thiamine tablet 100 mg  100 mg Oral Daily Joeangel Jeanpaul, [name], MD   100 mg at 04/26/20 0816  . vitamin B-12 (CYANOCOBALAMIN) tablet 1,000 mcg  1,000 mcg Oral Daily [name], MD   1,000 mcg at 04/26/20 6767    Lab Results: No results found for this or any previous visit (from the past 48 hour(s)).  Blood Alcohol level:  Lab Results  Component Value Date   ETH <10 11/03/2019   ETH <5 12/05/2016    Metabolic Disorder Labs: No results found for: HGBA1C, MPG No results found for: PROLACTIN No results found for: CHOL, TRIG, HDL, CHOLHDL, VLDL, LDLCALC  Physical Findings: AIMS:  , ,  ,  ,    CIWA:    COWS:     Musculoskeletal: Strength & Muscle Tone: decreased Gait & Station: unsteady Patient leans: Backward  Psychiatric Specialty Exam: Physical Exam Vitals and nursing note reviewed.  Constitutional:      Appearance: He is well-developed.  HENT:     Head: Normocephalic and atraumatic.  Eyes:     Conjunctiva/sclera: Conjunctivae normal.     Pupils: Pupils are equal, round, and reactive to light.  Cardiovascular:     Heart sounds: Normal heart sounds.   Pulmonary:     Effort: Pulmonary effort is normal.  Abdominal:     Palpations: Abdomen is soft.  Musculoskeletal:        General: Normal range of  motion.     Cervical back: Normal range of motion.  Skin:    General: Skin is warm and dry.  Neurological:     Mental Status: He is alert.  Psychiatric:        Attention and Perception: He is inattentive.        Mood and Affect: Affect is blunt.        Speech: He is noncommunicative.        Behavior: Behavior is slowed.     Review of Systems  Unable to perform ROS: Dementia    Blood pressure 101/65, pulse 85, temperature 98.4 F (36.9 C), temperature source Oral, resp. rate 14, height 5\' 9"  (1.753 m), weight 62.6 kg, SpO2 98 %.Body mass index is 20.38 kg/m.  General Appearance: Disheveled  Eye Contact:  Minimal  Speech:  Slow  Volume:  Decreased  Mood:  Euthymic  Affect:  Constricted  Thought Process:  Disorganized  Orientation:  Negative  Thought Content:  Illogical  Suicidal Thoughts:  No  Homicidal Thoughts:  No  Memory:  Immediate;   Fair Recent;   Poor Remote;   Poor  Judgement:  Impaired  Insight:  Shallow  Psychomotor Activity:  Decreased  Concentration:  Concentration: Poor  Recall:  Poor  Fund of Knowledge:  Poor  Language:  Poor  Akathisia:  No  Handed:  Right  AIMS (if indicated):     Assets:  Resilience  ADL's:  Impaired  Cognition:  Impaired,  Mild  Sleep:  Number of Hours: 4.5     Treatment Plan Summary: Plan No change to current medicine.  Patient is still on a one-to-one because of instability.  Largely a placement problem at this point.  Ongoing monitoring for seizures.  , MD 04/26/2020, 5:57 PM

## 2020-04-26 NOTE — Progress Notes (Signed)
Patient presents pleasant and in good mood. Active in the milieu watching TV and appeared  to have enjoyed himself.  He denies SI/HI/AVH depression, anxiety and pain. He remains on a 1:1 and has moments of impulsivity, but has been cooperative with being safe and allowing use of gait belt. He has been med compliant through out the day and tolerated his meds without incident. He remains safe on the unit with 15 minute safety checks and the 1:1 observation.   Cleo Butler-Nicholson, LPN

## 2020-04-26 NOTE — Plan of Care (Signed)
Patient is out of bed and active in the milieu. Remains on 1:1 for safety precautions. Anxious and restless with unsteady gait. Pleasant otherwise. Received medications and breakfast and had no concern. Safety precautions maintained.

## 2020-04-26 NOTE — BHH Group Notes (Signed)
LCSW Group Therapy Note   04/26/2020 2:30 PM  Type of Therapy and Topic:  Group Therapy:  Overcoming Obstacles   Participation Level:  Did Not Attend   Description of Group:    In this group patients will be encouraged to explore what they see as obstacles to their own wellness and recovery. They will be guided to discuss their thoughts, feelings, and behaviors related to these obstacles. The group will process together ways to cope with barriers, with attention given to specific choices patients can make. Each patient will be challenged to identify changes they are motivated to make in order to overcome their obstacles. This group will be process-oriented, with patients participating in exploration of their own experiences as well as giving and receiving support and challenge from other group members.   Therapeutic Goals: 1. Patient will identify personal and current obstacles as they relate to admission. 2. Patient will identify barriers that currently interfere with their wellness or overcoming obstacles.  3. Patient will identify feelings, thought process and behaviors related to these barriers. 4. Patient will identify two changes they are willing to make to overcome these obstacles:      Summary of Patient Progress X   Therapeutic Modalities:   Cognitive Behavioral Therapy Solution Focused Therapy Motivational Interviewing Relapse Prevention Therapy  Penni Homans, MSW, LCSW 04/26/2020 2:30 PM

## 2020-04-26 NOTE — Plan of Care (Signed)
  Problem: Education: Goal: Knowledge of [place] [facility] Education information/materials will improve Outcome: Progressing Goal: Emotional status will improve Outcome: Progressing Goal: Mental status will improve Outcome: Progressing Goal: Verbalization of understanding the information provided will improve Outcome: Progressing   Problem: Health Behavior/Discharge Planning: Goal: Identification of resources available to assist in meeting health care needs will improve Outcome: Progressing Goal: Compliance with treatment plan for underlying cause of condition will improve Outcome: Progressing   Problem: Physical Regulation: Goal: Ability to maintain clinical measurements within normal limits will improve Outcome: Progressing   Problem: Safety: Goal: Periods of time without injury will increase Outcome: Progressing   Problem: Education: Goal: Verbalization of understanding the information provided will improve Outcome: Progressing   Problem: Coping: Goal: Ability to demonstrate appropriate behavior when angry will improve Outcome: Progressing Goal: Ability to identify and develop effective coping behavior will improve Outcome: Progressing Goal: Ability to interact with others will improve Outcome: Progressing

## 2020-04-26 NOTE — Plan of Care (Signed)
  Problem: Education: Goal: Knowledge of [place] [facility] Education information/materials will improve Outcome: Not Progressing Goal: Emotional status will improve Outcome: Not Progressing Goal: Mental status will improve Outcome: Not Progressing Goal: Verbalization of understanding the information provided will improve Outcome: Not Progressing   Problem: Health Behavior/Discharge Planning: Goal: Identification of resources available to assist in meeting health care needs will improve Outcome: Not Progressing Goal: Compliance with treatment plan for underlying cause of condition will improve Outcome: Not Progressing

## 2020-04-26 NOTE — Progress Notes (Signed)
Physical Therapy Treatment Patient Details Name: Lance Ray MRN: [id number] DOB: 1970-11-05 Today's Date: 04/26/2020    History of Present Illness Mr. Besecker was admitted on behavior health unit on 7/16 after being on medical unit for seizures x 2 weeks. Pt with recent diagnosis of dementia. Pt seen by PT on medical unit, now evaluated on BH. PMH includes epilepsy and depression.    PT Comments    Pt ready for session.  In day room.  1:1 aid reports no falls but continues with +1 assist for safety.  He is able to stand and walk on unit with min guard +1.  Pants were a bit long for him stepping on pant legs and they were rolled up for safety but seemed to irritate him some and at times while walking would step on them trying to get them back down.  Gait remains generally unsteady and high fall risk.  Higher level balance activities and ex performed.  He has difficulty following cues and exaggerates movements causing activities to increase fall risk and assistance level.  For example pt asked to take a step backwards and he takes a very large step needing assist to prevent fall.  Jerky movements at times and further  higher level balance task were deferred for safety.     Follow Up Recommendations  SNF;Supervision/Assistance - 24 hour     Equipment Recommendations  Rolling walker with 5" wheels    Recommendations for Other Services       Precautions / Restrictions Precautions Precautions: Fall Restrictions Weight Bearing Restrictions: No    Mobility  Bed Mobility               General bed mobility comments: in day room on arrival.  anticipate no difficulties  Transfers Overall transfer level: Needs assistance Equipment used: 1 person hand held assist Transfers: Sit to/from Stand Sit to Stand: Min guard            Ambulation/Gait Ambulation/Gait assistance: Min assist Gait Distance (Feet): 200 Feet Assistive device: None Gait Pattern/deviations: Step-through  pattern;Narrow base of support Gait velocity: quick   General Gait Details: impulsive quick gait with heavy footfalls.  encouraged softer gait but pt walks on tip toes.  generally unsteady and remains high fall risk   Stairs             Wheelchair Mobility    Modified Rankin (Stroke Patients Only)       Balance   Sitting-balance support: Feet supported Sitting balance-Leahy Scale: Fair     Standing balance support: No upper extremity supported;Single extremity supported Standing balance-Leahy Scale: Fair                              Cognition Arousal/Alertness: Awake/alert Behavior During Therapy: Flat affect;Anxious Overall Cognitive Status: Difficult to assess                                        Exercises Other Exercises Other Exercises: higher level balance activiites inclusing sidestepping, toe raises, ab/add and SLR    General Comments        Pertinent Vitals/Pain Pain Assessment: No/denies pain    Home Living                      Prior Function  PT Goals (current goals can now be found in the care plan section) Progress towards PT goals: Progressing toward goals    Frequency    Min 2X/week      PT Plan Current plan remains appropriate    Co-evaluation              AM-PAC PT "6 Clicks" Mobility   Outcome Measure  Help needed turning from your back to your side while in a flat bed without using bedrails?: A Little Help needed moving from lying on your back to sitting on the side of a flat bed without using bedrails?: A Little Help needed moving to and from a bed to a chair (including a wheelchair)?: A Little Help needed standing up from a chair using your arms (e.g., wheelchair or bedside chair)?: A Little Help needed to walk in hospital room?: A Little Help needed climbing 3-5 steps with a railing? : A Lot 6 Click Score: 17    End of Session Equipment Utilized During  Treatment: Gait belt Activity Tolerance: Patient tolerated treatment well Patient left: in chair;with nursing/sitter in room Nurse Communication: Mobility status       Time: 3149-7026 PT Time Calculation (min) (ACUTE ONLY): 13 min  Charges:  $Gait Training: 8-22 mins                    Danielle Dess, PTA 04/26/20, 12:20 PM

## 2020-04-27 NOTE — Plan of Care (Signed)
  Problem: Group Participation Goal: STG - Patient will engage in groups without prompting or encouragement from LRT x3 group sessions within 5 recreation therapy group sessions Description: STG - Patient will engage in groups without prompting or encouragement from LRT x3 group sessions within 5 recreation therapy group sessions Outcome: Not Progressing   

## 2020-04-27 NOTE — Plan of Care (Signed)
D: Pt alert and oriented. Pt denies experiencing any anxiety/depression at this time. Pt denies experiencing any pain at this time. Pt denies experiencing any SI/HI, or AVH at this time. Pt's speech is slurred and incoherent some of the time however there are moments of clarity. Pt continues to be on a 1:1 d/t being a fall risk.    A: Scheduled medications administered to pt, per MD orders. Support and encouragement provided. Frequent verbal contact made. Routine safety checks conducted q15 minutes.   R: No adverse drug reactions noted. Pt verbally contracts for safety at this time. Pt complaint with medications and treatment plan. Pt interacts well with others on the unit. Pt remains safe at this time. Will continue to monitor.   Problem: Education: Goal: Knowledge of [facility] [facility] Education information/materials will improve 04/27/2020 1427 by Sharin Mons, RN Outcome: Not Progressing 04/27/2020 1427 by Sharin Mons, RN Outcome: Not Progressing Goal: Emotional status will improve 04/27/2020 1427 by Sharin Mons, RN Outcome: Not Progressing 04/27/2020 1427 by Sharin Mons, RN Outcome: Not Progressing Goal: Mental status will improve 04/27/2020 1427 by Sharin Mons, RN Outcome: Not Progressing 04/27/2020 1427 by Sharin Mons, RN Outcome: Not Progressing Goal: Verbalization of understanding the information provided will improve 04/27/2020 1427 by Sharin Mons, RN Outcome: Not Progressing 04/27/2020 1427 by Sharin Mons, RN Outcome: Not Progressing   Problem: Health Behavior/Discharge Planning: Goal: Identification of resources available to assist in meeting health care needs will improve 04/27/2020 1427 by Sharin Mons, RN Outcome: Not Progressing 04/27/2020 1427 by Sharin Mons, RN Outcome: Not Progressing Goal: Compliance with treatment plan for underlying cause of condition will improve 04/27/2020 1427 by Sharin Mons, RN Outcome: Not  Progressing 04/27/2020 1427 by Sharin Mons, RN Outcome: Not Progressing   Problem: Physical Regulation: Goal: Ability to maintain clinical measurements within normal limits will improve 04/27/2020 1427 by Sharin Mons, RN Outcome: Not Progressing 04/27/2020 1427 by Sharin Mons, RN Outcome: Not Progressing   Problem: Safety: Goal: Periods of time without injury will increase 04/27/2020 1427 by Sharin Mons, RN Outcome: Not Progressing 04/27/2020 1427 by Sharin Mons, RN Outcome: Not Progressing   Problem: Education: Goal: Verbalization of understanding the information provided will improve 04/27/2020 1427 by Sharin Mons, RN Outcome: Not Progressing 04/27/2020 1427 by Sharin Mons, RN Outcome: Not Progressing   Problem: Coping: Goal: Ability to demonstrate appropriate behavior when angry will improve 04/27/2020 1427 by Sharin Mons, RN Outcome: Not Progressing 04/27/2020 1427 by Sharin Mons, RN Outcome: Not Progressing Goal: Ability to identify and develop effective coping behavior will improve 04/27/2020 1427 by Sharin Mons, RN Outcome: Not Progressing 04/27/2020 1427 by Sharin Mons, RN Outcome: Not Progressing Goal: Ability to interact with others will improve 04/27/2020 1427 by Sharin Mons, RN Outcome: Not Progressing 04/27/2020 1427 by Sharin Mons, RN Outcome: Not Progressing

## 2020-04-27 NOTE — Plan of Care (Deleted)
°  Problem: Education: Goal: Knowledge of [facility] Education information/materials will improve Outcome: Not Progressing Goal: Emotional status will improve Outcome: Not Progressing Goal: Mental status will improve Outcome: Not Progressing Goal: Verbalization of understanding the information provided will improve Outcome: Not Progressing   Problem: Health Behavior/Discharge Planning: Goal: Identification of resources available to assist in meeting health care needs will improve Outcome: Not Progressing Goal: Compliance with treatment plan for underlying cause of condition will improve Outcome: Not Progressing   Problem: Physical Regulation: Goal: Ability to maintain clinical measurements within normal limits will improve Outcome: Not Progressing   Problem: Safety: Goal: Periods of time without injury will increase Outcome: Not Progressing   Problem: Education: Goal: Verbalization of understanding the information provided will improve Outcome: Not Progressing   Problem: Coping: Goal: Ability to demonstrate appropriate behavior when angry will improve Outcome: Not Progressing Goal: Ability to identify and develop effective coping behavior will improve Outcome: Not Progressing Goal: Ability to interact with others will improve Outcome: Not Progressing   

## 2020-04-27 NOTE — BHH Group Notes (Signed)
Emotional Regulation 04/27/2020 1PM  Type of Therapy/Topic:  Group Therapy:  Emotion Regulation  Participation Level:  Did Not Attend   Description of Group:   The purpose of this group is to assist patients in learning to regulate negative emotions and experience positive emotions. Patients will be guided to discuss ways in which they have been vulnerable to their negative emotions. These vulnerabilities will be juxtaposed with experiences of positive emotions or situations, and patients will be challenged to use positive emotions to combat negative ones. Special emphasis will be placed on coping with negative emotions in conflict situations, and patients will process healthy conflict resolution skills.  Therapeutic Goals: 1. Patient will identify two positive emotions or experiences to reflect on in order to balance out negative emotions 2. Patient will label two or more emotions that they find the most difficult to experience 3. Patient will demonstrate positive conflict resolution skills through discussion and/or role plays  Summary of Patient Progress:       Therapeutic Modalities:   Cognitive Behavioral Therapy Feelings Identification Dialectical Behavioral Therapy   Suzan Slick, LCSW 04/27/2020 3:04 PM

## 2020-04-27 NOTE — Progress Notes (Signed)
Recreation Therapy Notes  Date: 04/27/2020  Time: 9:30 am   Location: Craft room     Behavioral response: N/A   Intervention Topic: Stress Management    Discussion/Intervention: Patient did not attend group.   Clinical Observations/Feedback:  Patient did not attend group.   Naydene Kamrowski LRT/CTRS        Iain Sawchuk 04/27/2020 11:51 AM

## 2020-04-27 NOTE — Progress Notes (Signed)
Patient alert and oriented x 3 with periods of confusion to situation , he appears less irritable and not impulsive, he follows simple command, thoughts are organized, speech is clearer he is receptive to staff. Patient on 1:1 for safety, no distress noted he denies SI/HIAVH 15 minutes safety checks maintained will continue to monitor.closely.

## 2020-04-27 NOTE — Progress Notes (Signed)
[facility] MD Progress Note  04/27/2020 8:27 PM Lance Ray  MRN:  [id number] Subjective: Patient seen chart reviewed.  Patient actually seems a little better today.  He was slightly more cogent and his speech was more understandable.  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  Still did not know where he was although he actually could give me the correct date.  No seizures noted today.  Eating better. Principal Problem: Dementia (HCC) Diagnosis: Principal Problem:   Dementia (HCC) Active Problems:   Recurrent seizures (HCC)   Seizure disorder (HCC)   Psychosis (HCC)   Failure to thrive in adult   Stuttering   Depression   Anemia due to vitamin B12 deficiency  Total Time spent with patient: 30 minutes  Past Psychiatric History: Past history of dementia related to chronic seizure disorder  Past Medical History:  Past Medical History:  Diagnosis Date  . Seizures (HCC)     Past Surgical History:  Procedure Laterality Date  . SKIN GRAFT     Family History:  Family History  Problem Relation Age of Onset  . Seizures Mother   . Cerebral aneurysm Father    Family Psychiatric  History: See previous Social History:  Social History   Substance and Sexual Activity  Alcohol Use Yes  . Alcohol/week: 2.0 standard drinks  . Types: 2 Shots of liquor per week   Comment: "rarely"      Social History   Substance and Sexual Activity  Drug Use No    Social History   Socioeconomic History  . Marital status: Single    Spouse name: Not on file  . Number of children: Not on file  . Years of education: Not on file  . Highest education level: Not on file  Occupational History  . Not on file  Tobacco Use  . Smoking status: Current Some Day Smoker  . Smokeless tobacco: Current User  Vaping Use  . Vaping Use: Never used  Substance and Sexual Activity  . Alcohol use: Yes    Alcohol/week: 2.0 standard drinks    Types: 2 Shots of  liquor per week    Comment: "rarely"   . Drug use: No  . Sexual activity: Yes  Other Topics Concern  . Not on file  Social History Narrative  . Not on file   Social Determinants of Health   Financial Resource Strain:   . Difficulty of Paying Living Expenses:   Food Insecurity:   . Worried About Programme researcher, broadcasting/film/video in the Last Year:   . Barista in the Last Year:   Transportation Needs:   . Freight forwarder (Medical):   [name] Lack of Transportation (Non-Medical):   Physical Activity:   . Days of Exercise per Week:   . Minutes of Exercise per Session:   Stress:   . Feeling of Stress :   Social Connections:   . Frequency of Communication with Friends and Family:   . Frequency of Social Gatherings with Friends and Family:   . Attends Religious Services:   . Active Member of Clubs or Organizations:   . Attends Banker Meetings:   [name] Marital Status:    Additional Social History:                         Sleep: Fair  Appetite:  Fair  Current Medications: Current Facility-Administered  Medications  Medication Dose Route Frequency Provider Last Rate Last Admin  . acetaminophen (TYLENOL) tablet 650 mg  650 mg Oral Q6H PRN Noam Franzen, [name], MD   650 mg at 04/20/20 1248  . alum & mag hydroxide-simeth (MAALOX/MYLANTA) 200-200-20 MG/5ML suspension 30 mL  30 mL Oral Q4H PRN Jansen Sciuto T, MD      . haloperidol (HALDOL) tablet 2 mg  2 mg Oral Q6H PRN Dally Oshel T, MD   2 mg at 04/23/20 4709   Or  . haloperidol lactate (HALDOL) injection 1 mg  1 mg Intravenous Q6H PRN Lorn Butcher, [name], MD   1 mg at 04/17/20 0110  . haloperidol (HALDOL) tablet 3 mg  3 mg Oral QHS [name], MD   3 mg at 04/26/20 2129  . lamoTRIgine (LAMICTAL) tablet 150 mg  150 mg Oral BID Ahlayah Tarkowski, [name], MD   150 mg at 04/27/20 1621  . levETIRAcetam (KEPPRA) tablet 1,500 mg  1,500 mg Oral Q12H Pauletta Browns, MD   1,500 mg at 04/27/20 0801  . LORazepam (ATIVAN) injection 1 mg   1 mg Intramuscular PRN Paliy, Serina Cowper, MD      . magnesium hydroxide (MILK OF MAGNESIA) suspension 30 mL  30 mL Oral Daily PRN Linn Goetze T, MD      . megestrol (MEGACE) tablet 40 mg  40 mg Oral Daily [name], MD   40 mg at 04/27/20 0802  . OLANZapine (ZYPREXA) injection 10 mg  10 mg Intramuscular Q6H PRN Gwendelyn Lanting, [name], MD   10 mg at 04/17/20 0110  . OLANZapine zydis (ZYPREXA) disintegrating tablet 15 mg  15 mg Oral QHS [name], MD   15 mg at 04/26/20 2100  . ondansetron (ZOFRAN) tablet 4 mg  4 mg Oral Q6H PRN Grigor Lipschutz T, MD       Or  . ondansetron (ZOFRAN) injection 4 mg  4 mg Intravenous Q6H PRN Keary Hanak T, MD      . thiamine tablet 100 mg  100 mg Oral Daily Emer Onnen T, MD   100 mg at 04/27/20 0800  . vitamin B-12 (CYANOCOBALAMIN) tablet 1,000 mcg  1,000 mcg Oral Daily [name], MD   1,000 mcg at 04/27/20 0800    Lab Results: No results found for this or any previous visit (from the past 48 hour(s)).  Blood Alcohol level:  Lab Results  Component Value Date   ETH <10 11/03/2019   ETH <5 12/05/2016    Metabolic Disorder Labs: No results found for: HGBA1C, MPG No results found for: PROLACTIN No results found for: CHOL, TRIG, HDL, CHOLHDL, VLDL, LDLCALC  Physical Findings: AIMS:  , ,  ,  ,    CIWA:    COWS:     Musculoskeletal: Strength & Muscle Tone: decreased Gait & Station: unsteady Patient leans: N/A  Psychiatric Specialty Exam: Physical Exam Vitals and nursing note reviewed.  Constitutional:      Appearance: He is well-developed.  HENT:     Head: Normocephalic and atraumatic.  Eyes:     Conjunctiva/sclera: Conjunctivae normal.     Pupils: Pupils are equal, round, and reactive to light.  Cardiovascular:     Heart sounds: Normal heart sounds.  Pulmonary:     Effort: Pulmonary effort is normal.  Abdominal:     Palpations: Abdomen is soft.  Musculoskeletal:        General: Normal range of motion.     Cervical back:  Normal range of motion.  Skin:    General: Skin is warm and dry.  Neurological:     General: No focal deficit present.     Mental Status: He is alert.  Psychiatric:        Attention and Perception: He is inattentive.        Mood and Affect: Affect is blunt.        Speech: Speech is delayed.        Behavior: Behavior is slowed.        Thought Content: Thought content is not paranoid. Thought content does not include homicidal or suicidal ideation.        Cognition and Memory: Memory is impaired.        Judgment: Judgment is inappropriate.     Review of Systems  Constitutional: Negative.   HENT: Negative.   Eyes: Negative.   Respiratory: Negative.   Cardiovascular: Negative.   Gastrointestinal: Negative.   Musculoskeletal: Negative.   Skin: Negative.   Neurological: Negative.   Psychiatric/Behavioral: Positive for confusion.    Blood pressure 123/90, pulse 82, temperature 98.4 F (36.9 C), temperature source Oral, resp. rate 14, height 5\' 9"  (1.753 m), weight 62.6 kg, SpO2 100 %.Body mass index is 20.38 kg/m.  General Appearance: Disheveled  Eye Contact:  Minimal  Speech:  Slow  Volume:  Decreased  Mood:  Euthymic  Affect:  Constricted  Thought Process:  Disorganized  Orientation:  Full (Time, Place, and Person)  Thought Content:  Illogical  Suicidal Thoughts:  No  Homicidal Thoughts:  No  Memory:  Immediate;   Poor Recent;   Poor Remote;   Poor  Judgement:  Impaired  Insight:  Lacking  Psychomotor Activity:  Decreased  Concentration:  Concentration: Poor  Recall:  Poor  Fund of Knowledge:  Poor  Language:  Poor  Akathisia:  No  Handed:  Right  AIMS (if indicated):     Assets:  Resilience  ADL's:  Impaired  Cognition:  Impaired,  Moderate  Sleep:  Number of Hours: 5.3     Treatment Plan Summary: Plan No change to medicine.  Seizure medicine may be reaching an appropriate level as he seems to be more stable.  Continue encouraging p.o. intake and improvement  in strength.  It is my understanding that family is working on possible plans for guardianship  , MD 04/27/2020, 8:27 PM

## 2020-04-28 NOTE — Progress Notes (Signed)
[facility] MD Progress Note  04/28/2020 6:09 PM Lance Ray  MRN:  [id number] Subjective: Follow-up patient with epilepsy and dementia.  He is regaining his strength very well today.  Walking more independently.  Still a little bit of confusion in his memory but no agitation no violence no aggression. Principal Problem: Dementia (HCC) Diagnosis: Principal Problem:   Dementia (HCC) Active Problems:   Recurrent seizures (HCC)   Seizure disorder (HCC)   Psychosis (HCC)   Failure to thrive in adult   Stuttering   Depression   Anemia due to vitamin B12 deficiency  Total Time spent with patient: 30 minutes  Past Psychiatric History: Past history of chronic epilepsy which she had resisted treatment for  Past Medical History:  Past Medical History:  Diagnosis Date  . Seizures (HCC)     Past Surgical History:  Procedure Laterality Date  . SKIN GRAFT     Family History:  Family History  Problem Relation Age of Onset  . Seizures Mother   . Cerebral aneurysm Father    Family Psychiatric  History: See previous Social History:  Social History   Substance and Sexual Activity  Alcohol Use Yes  . Alcohol/week: 2.0 standard drinks  . Types: 2 Shots of liquor per week   Comment: "rarely"      Social History   Substance and Sexual Activity  Drug Use No    Social History   Socioeconomic History  . Marital status: Single    Spouse name: Not on file  . Number of children: Not on file  . Years of education: Not on file  . Highest education level: Not on file  Occupational History  . Not on file  Tobacco Use  . Smoking status: Current Some Day Smoker  . Smokeless tobacco: Current User  Vaping Use  . Vaping Use: Never used  Substance and Sexual Activity  . Alcohol use: Yes    Alcohol/week: 2.0 standard drinks    Types: 2 Shots of liquor per week    Comment: "rarely"   . Drug use: No  . Sexual activity: Yes  Other Topics Concern  . Not on file  Social History Narrative   . Not on file   Social Determinants of Health   Financial Resource Strain:   . Difficulty of Paying Living Expenses:   Food Insecurity:   . Worried About Programme researcher, broadcasting/film/video in the Last Year:   . Barista in the Last Year:   Transportation Needs:   . Freight forwarder (Medical):   [name] Lack of Transportation (Non-Medical):   Physical Activity:   . Days of Exercise per Week:   . Minutes of Exercise per Session:   Stress:   . Feeling of Stress :   Social Connections:   . Frequency of Communication with Friends and Family:   . Frequency of Social Gatherings with Friends and Family:   . Attends Religious Services:   . Active Member of Clubs or Organizations:   . Attends Banker Meetings:   [name] Marital Status:    Additional Social History:                         Sleep: Fair  Appetite:  Fair  Current Medications: Current Facility-Administered Medications  Medication Dose Route Frequency Provider Last Rate Last Admin  . acetaminophen (TYLENOL) tablet 650 mg  650 mg Oral Q6H PRN Saifullah Jolley, [name], MD   650  mg at 04/20/20 1248  . alum & mag hydroxide-simeth (MAALOX/MYLANTA) 200-200-20 MG/5ML suspension 30 mL  30 mL Oral Q4H PRN Tavi Hoogendoorn T, MD      . haloperidol (HALDOL) tablet 2 mg  2 mg Oral Q6H PRN Britten Seyfried T, MD   2 mg at 04/23/20 4034   Or  . haloperidol lactate (HALDOL) injection 1 mg  1 mg Intravenous Q6H PRN Latavia Goga, [name], MD   1 mg at 04/17/20 0110  . haloperidol (HALDOL) tablet 3 mg  3 mg Oral QHS [name], MD   3 mg at 04/27/20 2130  . lamoTRIgine (LAMICTAL) tablet 150 mg  150 mg Oral BID Britanni Yarde, [name], MD   150 mg at 04/28/20 1733  . levETIRAcetam (KEPPRA) tablet 1,500 mg  1,500 mg Oral Q12H Pauletta Browns, MD   1,500 mg at 04/28/20 0854  . LORazepam (ATIVAN) injection 1 mg  1 mg Intramuscular PRN Paliy, Serina Cowper, MD      . magnesium hydroxide (MILK OF MAGNESIA) suspension 30 mL  30 mL Oral Daily PRN Elion Hocker T, MD       . megestrol (MEGACE) tablet 40 mg  40 mg Oral Daily [name], MD   40 mg at 04/28/20 0854  . OLANZapine (ZYPREXA) injection 10 mg  10 mg Intramuscular Q6H PRN Quaneshia Wareing, [name], MD   10 mg at 04/17/20 0110  . OLANZapine zydis (ZYPREXA) disintegrating tablet 15 mg  15 mg Oral QHS [name], MD   15 mg at 04/27/20 2132  . ondansetron (ZOFRAN) tablet 4 mg  4 mg Oral Q6H PRN Seeley Hissong T, MD       Or  . ondansetron (ZOFRAN) injection 4 mg  4 mg Intravenous Q6H PRN Cariana Karge T, MD      . thiamine tablet 100 mg  100 mg Oral Daily Josalyn Dettmann, [name], MD   100 mg at 04/28/20 0854  . vitamin B-12 (CYANOCOBALAMIN) tablet 1,000 mcg  1,000 mcg Oral Daily [name], MD   1,000 mcg at 04/28/20 7425    Lab Results: No results found for this or any previous visit (from the past 48 hour(s)).  Blood Alcohol level:  Lab Results  Component Value Date   ETH <10 11/03/2019   ETH <5 12/05/2016    Metabolic Disorder Labs: No results found for: HGBA1C, MPG No results found for: PROLACTIN No results found for: CHOL, TRIG, HDL, CHOLHDL, VLDL, LDLCALC  Physical Findings: AIMS:  , ,  ,  ,    CIWA:    COWS:     Musculoskeletal: Strength & Muscle Tone: within normal limits Gait & Station: normal Patient leans: N/A  Psychiatric Specialty Exam: Physical Exam Vitals and nursing note reviewed.  Constitutional:      Appearance: He is well-developed.  HENT:     Head: Normocephalic and atraumatic.  Eyes:     Conjunctiva/sclera: Conjunctivae normal.     Pupils: Pupils are equal, round, and reactive to light.  Cardiovascular:     Heart sounds: Normal heart sounds.  Pulmonary:     Effort: Pulmonary effort is normal.  Abdominal:     Palpations: Abdomen is soft.  Musculoskeletal:        General: Normal range of motion.     Cervical back: Normal range of motion.  Skin:    General: Skin is warm and dry.  Neurological:     General: No focal deficit present.     Mental  Status: He  is alert.  Psychiatric:        Cognition and Memory: Memory is impaired.     Review of Systems  Constitutional: Negative.   HENT: Negative.   Eyes: Negative.   Respiratory: Negative.   Cardiovascular: Negative.   Gastrointestinal: Negative.   Musculoskeletal: Negative.   Skin: Negative.   Neurological: Negative.   Psychiatric/Behavioral: Negative.     Blood pressure 110/79, pulse 74, temperature 98.6 F (37 C), temperature source Oral, resp. rate 19, height 5\' 9"  (1.753 m), weight 62.6 kg, SpO2 100 %.Body mass index is 20.38 kg/m.  General Appearance: Casual  Eye Contact:  Fair  Speech:  Clear and Coherent  Volume:  Normal  Mood:  Euthymic  Affect:  Constricted  Thought Process:  Goal Directed  Orientation:  Full (Time, Place, and Person)  Thought Content:  Logical  Suicidal Thoughts:  No  Homicidal Thoughts:  No  Memory:  Immediate;   Fair Recent;   Poor Remote;   Fair  Judgement:  Impaired  Insight:  Shallow  Psychomotor Activity:  Decreased  Concentration:  Concentration: Fair  Recall:  of Knowledge:  Fair  Language:  Fair  Akathisia:  No  Handed:  Right  AIMS (if indicated):     Assets:  Desire for Improvement  ADL's:  Impaired  Cognition:  Impaired,  Mild  Sleep:  Number of Hours: 4.15     Treatment Plan Summary: Plan Family is still trying to seek guardianship.  No change to medication or plan.  Encourage patient and his ambulation.  [employer], MD 04/28/2020, 6:09 PM

## 2020-04-28 NOTE — Progress Notes (Signed)
Recreation Therapy Notes   Date: 04/28/2020  Time: 9:30 am   Location: Craft room     Behavioral response: N/A   Intervention Topic: Strengths   Discussion/Intervention: Patient did not attend group.   Clinical Observations/Feedback:  Patient did not attend group.   Ermal Haberer LRT/CTRS        Phares Zaccone 04/28/2020 11:56 AM

## 2020-04-28 NOTE — Plan of Care (Signed)
Pt denies depression, anxiety, SI, HI and AVH. Pt was educated on care plan and verbalizes understanding. Pt was encouraged to attend groups. Shalissa Easterwood RN Problem: Education: Goal: Knowledge of [place] [facility] Education information/materials will improve Outcome: Progressing Goal: Emotional status will improve Outcome: Progressing Goal: Mental status will improve Outcome: Progressing Goal: Verbalization of understanding the information provided will improve Outcome: Progressing   Problem: Health Behavior/Discharge Planning: Goal: Identification of resources available to assist in meeting health care needs will improve Outcome: Progressing Goal: Compliance with treatment plan for underlying cause of condition will improve Outcome: Progressing   Problem: Physical Regulation: Goal: Ability to maintain clinical measurements within normal limits will improve Outcome: Progressing   Problem: Safety: Goal: Periods of time without injury will increase Outcome: Progressing   Problem: Education: Goal: Verbalization of understanding the information provided will improve Outcome: Progressing   Problem: Coping: Goal: Ability to demonstrate appropriate behavior when angry will improve Outcome: Progressing Goal: Ability to identify and develop effective coping behavior will improve Outcome: Progressing Goal: Ability to interact with others will improve Outcome: Progressing   

## 2020-04-28 NOTE — BHH Counselor (Signed)
CSW followed up with the patient's sister [name].  She reports "evertyhing is in the hands of the court system".  She reports that disability, DSS and herself are all waiting for the "clerk of court to do their thing".  She reports that she does have an interview on 8/13 at 11AM for the patient for disability.  She reports that she has been advised to leave the patient's money in the bank alone because her name is not associated with the account.  She reports that at this time the family is still waiting for a scheduled court date.   She reports that a letter from the psychiatrist is needed; detailing the patients condition an progress or lack thereof.  Penni Homans, MSW, LCSW 04/28/2020 1:42 PM

## 2020-04-28 NOTE — Progress Notes (Signed)
Patient remains on 1:1 observation and continues with moments of impulsivity. He is pleasant and med compliant.  He tolerated meds without incident. He denied SI/HI/AVH anxiety, depression and pain at this encounter. Reports his only goal at this point is to be discharged. He remains safe at this time with 1:1 and minute safety checks.   Cleo Butler-Nicholson, LPN

## 2020-04-28 NOTE — Progress Notes (Signed)
Patient alert and oriented x 3 with periods of confusion to situation , he appears less irritable and less impulsive , he follows simple command, thoughts are organized, speech is much clearer he is receptive to staff. Patient on 1:1 for safety, no distress noted he denies SI/HIAVH 15 minutes safety checks maintained will continue to monitor.closely.  

## 2020-04-28 NOTE — Progress Notes (Signed)
Physical Therapy Treatment Patient Details Name: Lance Ray MRN: [id number] DOB: 02/28/1971 Today's Date: 04/28/2020    History of Present Illness Lance Ray was admitted on behavior health unit on 7/16 after being on medical unit for seizures x 2 weeks. Pt with recent diagnosis of dementia. Pt seen by PT on medical unit, now evaluated on BH. PMH includes epilepsy and depression.    PT Comments    Pt was asleep in supine with 1 on 1 sitter in room. He easily awakes and agrees to PT session. Is disoriented to situation and time.  He was able to exit R side of bed without assistance. Stood to [employer] on first trial and ambulate 25 ft. Due to pt's impulsivity, pt demonstrates improved safety without use of AD. He continues to have severe dynamic balance deficits and is high fall risk. He tolerated ambulation 300 ft without AD but requires min assist to prevent fall with heavy use of gait belt. Pt will continue to benefit from skilled PT in hospital and at DC. At conclusion of session, pt was in bed with sitter at bedside.     Follow Up Recommendations  SNF;Supervision/Assistance - 24 hour     Equipment Recommendations  None recommended by PT    Recommendations for Other Services       Precautions / Restrictions Precautions Precautions: Fall Restrictions Weight Bearing Restrictions: No    Mobility  Bed Mobility Overal bed mobility: Modified Independent Bed Mobility: Supine to Sit;Sit to Supine     Supine to sit: Modified independent (Device/Increase time) Sit to supine: Modified independent (Device/Increase time)   General bed mobility comments: Pt was easily able to supine to sit and retuen to supine after OOB activity  Transfers Overall transfer level: Needs assistance Equipment used: Rolling walker (2 wheeled);1 person hand held assist Transfers: Sit to/from Stand Sit to Stand: Min guard         General transfer comment: Pt is impulsive and requires CGA for safety.  First STS to RW however due to impulsivity and miss use of RW, pt demonstrates much improved safety without AD.  Ambulation/Gait Ambulation/Gait assistance: Min assist Gait Distance (Feet): 300 Feet Assistive device: None Gait Pattern/deviations: Step-through pattern;Narrow base of support;Scissoring;Drifts right/left Gait velocity: quick/impulsive   General Gait Details: Pt performed two trials of gait training. One with RW and one without AD. Pt demonstrates much improved safety without AD. He does however continue to present with major dynamic balance deficits. Pt requires min assist + moderate Vcs for safety. He overall tolerated ambulation well.   Stairs             Wheelchair Mobility    Modified Rankin (Stroke Patients Only)       Balance Overall balance assessment: Needs assistance Sitting-balance support: Feet supported Sitting balance-Leahy Scale: Good Sitting balance - Comments: CGA for safety 2/2 to impulsivity   Standing balance support: Single extremity supported;During functional activity Standing balance-Leahy Scale: Fair Standing balance comment: MIn assist during dynamic balance exercises                            Cognition Arousal/Alertness: Awake/alert Behavior During Therapy: Impulsive Overall Cognitive Status: No family/caregiver present to determine baseline cognitive functioning Area of Impairment: Following commands;Safety/judgement;Awareness;Problem solving                 Orientation Level: Disoriented to;Situation;Time   Memory: Decreased recall of precautions;Decreased short-term memory Following Commands: Follows one  step commands consistently;Follows one step commands with increased time;Follows multi-step commands inconsistently;Follows multi-step commands with increased time Safety/Judgement: Decreased awareness of safety;Decreased awareness of deficits Awareness: Intellectual Problem Solving: Slow  processing;Difficulty sequencing;Requires verbal cues;Requires tactile cues General Comments: pt is agreeable to session once motivated. He was asleep upon entry but easly awakes. pt is disoriented but was able to follow commands       Exercises Other Exercises Other Exercises: Pt performed and tolerated various dynamic balance exercises to promote improved standing balance/gait balance. he continues to be high fall risk and is very unsteady.     General Comments        Pertinent Vitals/Pain Pain Assessment: 0-10 Pain Score: 3  Faces Pain Scale: Hurts a little bit Pain Location: LBP Pain Descriptors / Indicators: Discomfort Pain Intervention(s): Limited activity within patient's tolerance;Monitored during session    Home Living                      Prior Function            PT Goals (current goals can now be found in the care plan section) Acute Rehab PT Goals Patient Stated Goal: go home Progress towards PT goals: Progressing toward goals    Frequency    Min 2X/week      PT Plan Current plan remains appropriate    Co-evaluation              AM-PAC PT "6 Clicks" Mobility   Outcome Measure  Help needed turning from your back to your side while in a flat bed without using bedrails?: None Help needed moving from lying on your back to sitting on the side of a flat bed without using bedrails?: None Help needed moving to and from a bed to a chair (including a wheelchair)?: A Little Help needed standing up from a chair using your arms (e.g., wheelchair or bedside chair)?: A Little Help needed to walk in hospital room?: A Little Help needed climbing 3-5 steps with a railing? : A Little 6 Click Score: 20    End of Session Equipment Utilized During Treatment: Gait belt Activity Tolerance: Patient tolerated treatment well Patient left: in bed;with call bell/phone within reach;with nursing/sitter in room Nurse Communication: Mobility status PT Visit  Diagnosis: Unsteadiness on feet (R26.81);Other abnormalities of gait and mobility (R26.89);Muscle weakness (generalized) (M62.81)     Time: 2330-0762 PT Time Calculation (min) (ACUTE ONLY): 25 min  Charges:  $Gait Training: 8-22 mins $Neuromuscular Re-education: 8-22 mins                     Jetta Lout PTA 04/28/20, 9:20 AM

## 2020-04-28 NOTE — Plan of Care (Signed)
  Problem: Education: Goal: Knowledge of [place] Education information/materials will improve Outcome: Progressing Goal: Emotional status will improve Outcome: Progressing Goal: Mental status will improve Outcome: Progressing Goal: Verbalization of understanding the information provided will improve Outcome: Progressing   Problem: Health Behavior/Discharge Planning: Goal: Identification of resources available to assist in meeting health care needs will improve Outcome: Progressing Goal: Compliance with treatment plan for underlying cause of condition will improve Outcome: Progressing   Problem: Physical Regulation: Goal: Ability to maintain clinical measurements within normal limits will improve Outcome: Progressing   Problem: Safety: Goal: Periods of time without injury will increase Outcome: Progressing   Problem: Education: Goal: Verbalization of understanding the information provided will improve Outcome: Progressing   Problem: Coping: Goal: Ability to demonstrate appropriate behavior when angry will improve Outcome: Progressing Goal: Ability to identify and develop effective coping behavior will improve Outcome: Progressing Goal: Ability to interact with others will improve Outcome: Progressing   

## 2020-04-29 NOTE — Progress Notes (Signed)
[facility] MD Progress Note  04/29/2020 2:25 PM Lance Ray  MRN:  [id number] Subjective: Follow-up for this patient with dementia related to medical problems.  Was asleep in his room in the afternoon but easily arousable.  No new complaints.  Superficially seems to be improved although when asked questions he will often tend to trail off into mumbling at the end and has a hard time making a really coherent explanation of his outpatient plan.  No violence no aggression no dangerous behavior.  Eating better.  Strength improving.  No seizures in the last day. Principal Problem: Dementia (HCC) Diagnosis: Principal Problem:   Dementia (HCC) Active Problems:   Recurrent seizures (HCC)   Seizure disorder (HCC)   Psychosis (HCC)   Failure to thrive in adult   Stuttering   Depression   Anemia due to vitamin B12 deficiency  Total Time spent with patient: 30 minutes  Past Psychiatric History: Patient largely has a longstanding problem with seizure disorder and to some extent I believe with alcohol abuse.  Past Medical History:  Past Medical History:  Diagnosis Date  . Seizures (HCC)     Past Surgical History:  Procedure Laterality Date  . SKIN GRAFT     Family History:  Family History  Problem Relation Age of Onset  . Seizures Mother   . Cerebral aneurysm Father    Family Psychiatric  History: See previous Social History:  Social History   Substance and Sexual Activity  Alcohol Use Yes  . Alcohol/week: 2.0 standard drinks  . Types: 2 Shots of liquor per week   Comment: "rarely"      Social History   Substance and Sexual Activity  Drug Use No    Social History   Socioeconomic History  . Marital status: Single    Spouse name: Not on file  . Number of children: Not on file  . Years of education: Not on file  . Highest education level: Not on file  Occupational History  . Not on file  Tobacco Use  . Smoking status: Current Some Day Smoker  . Smokeless tobacco: Current  User  Vaping Use  . Vaping Use: Never used  Substance and Sexual Activity  . Alcohol use: Yes    Alcohol/week: 2.0 standard drinks    Types: 2 Shots of liquor per week    Comment: "rarely"   . Drug use: No  . Sexual activity: Yes  Other Topics Concern  . Not on file  Social History Narrative  . Not on file   Social Determinants of Health   Financial Resource Strain:   . Difficulty of Paying Living Expenses:   Food Insecurity:   . Worried About Programme researcher, broadcasting/film/video in the Last Year:   . Barista in the Last Year:   Transportation Needs:   . Freight forwarder (Medical):   [name] Lack of Transportation (Non-Medical):   Physical Activity:   . Days of Exercise per Week:   . Minutes of Exercise per Session:   Stress:   . Feeling of Stress :   Social Connections:   . Frequency of Communication with Friends and Family:   . Frequency of Social Gatherings with Friends and Family:   . Attends Religious Services:   . Active Member of Clubs or Organizations:   . Attends Banker Meetings:   [name] Marital Status:    Additional Social History:  Sleep: Fair  Appetite:  Fair  Current Medications: Current Facility-Administered Medications  Medication Dose Route Frequency Provider Last Rate Last Admin  . acetaminophen (TYLENOL) tablet 650 mg  650 mg Oral Q6H PRN Cora Stetson, [name], MD   650 mg at 04/20/20 1248  . alum & mag hydroxide-simeth (MAALOX/MYLANTA) 200-200-20 MG/5ML suspension 30 mL  30 mL Oral Q4H PRN Brinton Brandel T, MD      . haloperidol (HALDOL) tablet 2 mg  2 mg Oral Q6H PRN Flora Ratz T, MD   2 mg at 04/23/20 1696   Or  . haloperidol lactate (HALDOL) injection 1 mg  1 mg Intravenous Q6H PRN Damare Serano, [name], MD   1 mg at 04/17/20 0110  . haloperidol (HALDOL) tablet 3 mg  3 mg Oral QHS [name], MD   3 mg at 04/28/20 2112  . lamoTRIgine (LAMICTAL) tablet 150 mg  150 mg Oral BID Daryn Pisani, [name], MD   150 mg at  04/29/20 0827  . levETIRAcetam (KEPPRA) tablet 1,500 mg  1,500 mg Oral Q12H Pauletta Browns, MD   1,500 mg at 04/29/20 0827  . LORazepam (ATIVAN) injection 1 mg  1 mg Intramuscular PRN Paliy, Serina Cowper, MD      . magnesium hydroxide (MILK OF MAGNESIA) suspension 30 mL  30 mL Oral Daily PRN Terin Dierolf T, MD      . megestrol (MEGACE) tablet 40 mg  40 mg Oral Daily [name], MD   40 mg at 04/29/20 0827  . OLANZapine (ZYPREXA) injection 10 mg  10 mg Intramuscular Q6H PRN Samul Mcinroy, [name], MD   10 mg at 04/17/20 0110  . OLANZapine zydis (ZYPREXA) disintegrating tablet 15 mg  15 mg Oral QHS [name], MD   15 mg at 04/28/20 2113  . ondansetron (ZOFRAN) tablet 4 mg  4 mg Oral Q6H PRN Sherria Riemann T, MD       Or  . ondansetron (ZOFRAN) injection 4 mg  4 mg Intravenous Q6H PRN Labrian Torregrossa T, MD      . thiamine tablet 100 mg  100 mg Oral Daily Jai Bear, [name], MD   100 mg at 04/29/20 0827  . vitamin B-12 (CYANOCOBALAMIN) tablet 1,000 mcg  1,000 mcg Oral Daily [name], MD   1,000 mcg at 04/29/20 0827    Lab Results: No results found for this or any previous visit (from the past 48 hour(s)).  Blood Alcohol level:  Lab Results  Component Value Date   ETH <10 11/03/2019   ETH <5 12/05/2016    Metabolic Disorder Labs: No results found for: HGBA1C, MPG No results found for: PROLACTIN No results found for: CHOL, TRIG, HDL, CHOLHDL, VLDL, LDLCALC  Physical Findings: AIMS:  , ,  ,  ,    CIWA:    COWS:     Musculoskeletal: Strength & Muscle Tone: within normal limits Gait & Station: normal Patient leans: N/A  Psychiatric Specialty Exam: Physical Exam Constitutional:      Appearance: He is well-developed.  HENT:     Head: Normocephalic and atraumatic.  Eyes:     Conjunctiva/sclera: Conjunctivae normal.     Pupils: Pupils are equal, round, and reactive to light.  Cardiovascular:     Heart sounds: Normal heart sounds.  Pulmonary:     Effort: Pulmonary effort  is normal.  Abdominal:     Palpations: Abdomen is soft.  Musculoskeletal:        General: Normal range of motion.  Cervical back: Normal range of motion.  Skin:    General: Skin is warm and dry.  Neurological:     General: No focal deficit present.     Mental Status: He is alert.  Psychiatric:        Attention and Perception: He is inattentive.        Mood and Affect: Affect is blunt.        Speech: Speech is delayed and tangential.        Behavior: Behavior is slowed. Behavior is not agitated or aggressive.        Thought Content: Thought content is delusional. Thought content is not paranoid. Thought content does not include homicidal or suicidal ideation.        Cognition and Memory: Memory is impaired.        Judgment: Judgment is inappropriate.     Review of Systems  Constitutional: Negative.   HENT: Negative.   Eyes: Negative.   Respiratory: Negative.   Cardiovascular: Negative.   Gastrointestinal: Negative.   Musculoskeletal: Negative.   Skin: Negative.   Neurological: Negative.   Psychiatric/Behavioral: Negative.     Blood pressure 118/76, pulse 76, temperature 98.6 F (37 C), temperature source Oral, resp. rate 18, height 5\' 9"  (1.753 m), weight 62.6 kg, SpO2 98 %.Body mass index is 20.38 kg/m.  General Appearance: Casual  Eye Contact:  Minimal  Speech:  Slow  Volume:  Decreased  Mood:  Euthymic  Affect:  Constricted  Thought Process:  Coherent  Orientation:  Negative  Thought Content:  Tangential  Suicidal Thoughts:  No  Homicidal Thoughts:  No  Memory:  Immediate;   Fair Recent;   Poor Remote;   Poor  Judgement:  Impaired  Insight:  Shallow  Psychomotor Activity:  Decreased  Concentration:  Concentration: Poor  Recall:  Poor  Fund of Knowledge:  Poor  Language:  Fair  Akathisia:  No  Handed:  Right  AIMS (if indicated):     Assets:  Desire for Improvement  ADL's:  Impaired  Cognition:  Impaired,  Mild  Sleep:  Number of Hours: 8.5      Treatment Plan Summary: Daily contact with patient to assess and evaluate symptoms and progress in treatment, Medication management and Plan Patient appears to probably have a chronic persisting dementia.  Most likely getting back to his baseline.  He certainly would like to be discharged.  Last I was told the family is attempting to get temporary or possibly permanent guardianship so that they can have access to his funds.  However at this point the patient is not showing much benefit from further hospitalization.  No change to medicine today.  I will talk with the treatment team and we may need to try and push for discharge in the next few days if the family has not come up with a better plan.  , MD 04/29/2020, 2:25 PM

## 2020-04-29 NOTE — Progress Notes (Signed)
1:1 Patient Hourly Rounding  1000: Patient is in his room, asleep, with his assigned safety sitter present at bedside.  1400: Patient is asleep, with his assigned safety sitter present at bedside.  1800: Patient is lying in bed, reading a magazine, with his assigned safety sitter present at bedside.

## 2020-04-29 NOTE — Plan of Care (Signed)
D- Patient alert and oriented to person and situation. Patient presented in a pleasant mood on assessment stating that he slept really well last night and had no complaints to voice to this Clinical research associate. Patient denied SI, HI, AVH, and pain at this time. Patient also denied any signs/symptoms of depression/anxiety. Patient had no stated goals for today.   A- Scheduled medications administered to patient, per MD orders. Support and encouragement provided.  Routine safety checks conducted every 15 minutes.  Patient informed to notify staff with problems or concerns.  R- No adverse drug reactions noted. Patient contracts for safety at this time. Patient compliant with medications. Patient receptive, calm, and cooperative. Patient remains safe at this time.  Problem: Education: Goal: Knowledge of [facility] Education information/materials will improve Outcome: Progressing Goal: Emotional status will improve Outcome: Progressing Goal: Mental status will improve Outcome: Progressing Goal: Verbalization of understanding the information provided will improve Outcome: Progressing   Problem: Health Behavior/Discharge Planning: Goal: Identification of resources available to assist in meeting health care needs will improve Outcome: Progressing Goal: Compliance with treatment plan for underlying cause of condition will improve Outcome: Progressing   Problem: Physical Regulation: Goal: Ability to maintain clinical measurements within normal limits will improve Outcome: Progressing   Problem: Safety: Goal: Periods of time without injury will increase Outcome: Progressing   Problem: Education: Goal: Verbalization of understanding the information provided will improve Outcome: Progressing   Problem: Coping: Goal: Ability to demonstrate appropriate behavior when angry will improve Outcome: Progressing Goal: Ability to identify and develop effective coping behavior will improve Outcome:  Progressing Goal: Ability to interact with others will improve Outcome: Progressing

## 2020-04-29 NOTE — Progress Notes (Signed)
Recreation Therapy Notes   Date: 04/29/2020  Time: 9:30 am   Location: Court yard   Behavioral response: N/A   Intervention Topic: Leisure   Discussion/Intervention: Patient did not attend group.   Clinical Observations/Feedback:  Patient did not attend group.   Marijose Curington LRT/CTRS        Jahayra Mazo 04/29/2020 11:37 AM

## 2020-04-30 MED ORDER — CLONAZEPAM 0.25 MG PO TBDP
0.5000 mg | ORAL_TABLET | Freq: Three times a day (TID) | ORAL | Status: DC
  Administered 2020-04-30 – 2020-05-01 (×3): 0.5 mg via ORAL
  Filled 2020-04-30 (×3): qty 2

## 2020-04-30 MED ORDER — LAMOTRIGINE 25 MG PO TABS: 175.0000 mg | ORAL_TABLET | Freq: Two times a day (BID) | ORAL | Status: DC

## 2020-04-30 MED ORDER — VITAMIN B-6 50 MG PO TABS
50.0000 mg | ORAL_TABLET | Freq: Every day | ORAL | Status: DC
  Administered 2020-04-30 – 2020-05-07 (×8): 50 mg via ORAL
  Filled 2020-04-30 (×9): qty 1

## 2020-04-30 MED ORDER — LORAZEPAM 2 MG/ML IJ SOLN
2.0000 mg | Freq: Once | INTRAMUSCULAR | Status: AC | PRN
  Administered 2020-05-01: 2 mg via INTRAMUSCULAR
  Filled 2020-04-30: qty 1

## 2020-04-30 MED ORDER — LAMOTRIGINE 25 MG PO TABS
175.0000 mg | ORAL_TABLET | Freq: Every evening | ORAL | Status: AC
  Administered 2020-04-30 – 2020-05-06 (×7): 175 mg via ORAL
  Filled 2020-04-30 (×6): qty 1
  Filled 2020-04-30: qty 3
  Filled 2020-04-30 (×2): qty 1

## 2020-04-30 MED ORDER — LEVETIRACETAM 750 MG PO TABS
1500.0000 mg | ORAL_TABLET | Freq: Every evening | ORAL | Status: AC
  Administered 2020-04-30 – 2020-05-05 (×7): 1500 mg via ORAL
  Filled 2020-04-30 (×7): qty 2

## 2020-04-30 MED ORDER — OLANZAPINE 5 MG PO TBDP
10.0000 mg | ORAL_TABLET | Freq: Every day | ORAL | Status: DC
  Administered 2020-04-30 – 2020-05-02 (×3): 10 mg via ORAL
  Filled 2020-04-30 (×3): qty 2

## 2020-04-30 MED ORDER — LEVETIRACETAM 500 MG PO TABS
1000.0000 mg | ORAL_TABLET | ORAL | Status: AC
  Administered 2020-05-01 – 2020-05-06 (×6): 1000 mg via ORAL
  Filled 2020-04-30 (×6): qty 2

## 2020-04-30 MED ORDER — LEVETIRACETAM 500 MG PO TABS: 1000.0000 mg | ORAL_TABLET | Freq: Two times a day (BID) | ORAL | Status: DC

## 2020-04-30 MED ORDER — LORAZEPAM 2 MG/ML IJ SOLN: 2.0000 mg | Freq: Once | INTRAMUSCULAR | Status: DC | PRN

## 2020-04-30 MED ORDER — LAMOTRIGINE 100 MG PO TABS
150.0000 mg | ORAL_TABLET | Freq: Every morning | ORAL | Status: AC
  Administered 2020-05-01 – 2020-05-06 (×6): 150 mg via ORAL
  Filled 2020-04-30 (×5): qty 2
  Filled 2020-04-30: qty 1.5
  Filled 2020-04-30: qty 2

## 2020-04-30 NOTE — Progress Notes (Signed)
PT Cancellation Note  Patient Details Name: Lance Ray MRN: [id number] DOB: [birth year]   Cancelled Treatment:     Pt was ambulating in room and has been ambulating  in halls per sitter with supervision only. Pt did not want to participate in PT at this time. Will continue to follow per current POC.    Rushie Chestnut 04/30/2020, 9:29 AM

## 2020-04-30 NOTE — Progress Notes (Signed)
Patient noted running out of the room with sitter at bedside with some belongings in his hands he stated " I want to get out" he was redirected but he wasn't receptive he was verbally and physically aggressive charging towards staff and not  following instruction, his speech is slurred , thoughts are disorganized and incoherent, he  was assisted back to bed, sitter at  bedside will continue to monitor.

## 2020-04-30 NOTE — Progress Notes (Signed)
Patient increasingly got agitated swinging at staff, posturing, not receptive to staff, Clinical research associate and colleague tried several diversion namely  Desiccation, Medication, education , emotional support he refused , he instead became more aggressive as a last result he was put in a restraint chair, vs was checked upon placement in chair no distress noted will continue to closely monitor.

## 2020-04-30 NOTE — Progress Notes (Signed)
Recreation Therapy Notes  Date: 04/30/2020  Time: 9:30 am  Location: Craft room   Behavioral response: Appropriate, redirection needed   Intervention Topic: Self-esteem   Discussion/Intervention:  Group content today was focused on self-esteem. Patient defined self-esteem and where it comes form. The group described reasons self-esteem is important. Individuals stated things that impact self-esteem and positive ways to improve self-esteem. The group participated in the intervention "self-butterfly" where patients were able to create a butterfly of positive things that makes them who they are. Clinical Observations/Feedback:  Patient came to group and expressed that he did not  know anything positive about self-esteem. Most of his speech was mumbled and he needed redirection to focus on the task at hand and to not keep getting up in group due to him being a fall risk. Participant was able to pick a positive affirmation "This day brings me nothing but joy".    Lance Ray LRT/CTRS         Lance Ray 04/30/2020 1:25 PM

## 2020-04-30 NOTE — Progress Notes (Addendum)
Patient alert and oriented x 4 affect is blunted, he appears irritable agitated not following instructions from staff, pacing the room attempting to go into other patients room., very loud and disruptive on the milieu, hej was repeated assisted back to the room, sitter at bedside will continue to monitor closely.

## 2020-04-30 NOTE — Progress Notes (Signed)
[facility] MD Progress Note  04/30/2020 3:46 PM Lance Ray  MRN:  [id number] Subjective: Follow-up for this patient with dementia seizure and intermittent delirium.  Last night he became acutely agitated and violent lashing out at staff for no clear reason.  Reports are that he seemed at times to be having visual hallucinations.  On interview today the patient was disorganized.  Unable to tell me where he was and very confused in his speech.  I very much appreciate neurology seeing the patient today and making some adjustments to his anticonvulsant medications.  This afternoon he has calm down.  No further violence. Principal Problem: Dementia (HCC) Diagnosis: Principal Problem:   Dementia (HCC) Active Problems:   Recurrent seizures (HCC)   Seizure disorder (HCC)   Psychosis (HCC)   Failure to thrive in adult   Stuttering   Depression   Anemia due to vitamin B12 deficiency  Total Time spent with patient: 30 minutes  Past Psychiatric History: Longstanding history of epilepsy  Past Medical History:  Past Medical History:  Diagnosis Date  . Seizures (HCC)     Past Surgical History:  Procedure Laterality Date  . SKIN GRAFT     Family History:  Family History  Problem Relation Age of Onset  . Seizures Mother   . Cerebral aneurysm Father    Family Psychiatric  History: See previous Social History:  Social History   Substance and Sexual Activity  Alcohol Use Yes  . Alcohol/week: 2.0 standard drinks  . Types: 2 Shots of liquor per week   Comment: "rarely"      Social History   Substance and Sexual Activity  Drug Use No    Social History   Socioeconomic History  . Marital status: Single    Spouse name: Not on file  . Number of children: Not on file  . Years of education: Not on file  . Highest education level: Not on file  Occupational History  . Not on file  Tobacco Use  . Smoking status: Current Some Day Smoker  . Smokeless tobacco: Current User  Vaping Use  .  Vaping Use: Never used  Substance and Sexual Activity  . Alcohol use: Yes    Alcohol/week: 2.0 standard drinks    Types: 2 Shots of liquor per week    Comment: "rarely"   . Drug use: No  . Sexual activity: Yes  Other Topics Concern  . Not on file  Social History Narrative  . Not on file   Social Determinants of Health   Financial Resource Strain:   . Difficulty of Paying Living Expenses:   Food Insecurity:   . Worried About Programme researcher, broadcasting/film/video in the Last Year:   . Barista in the Last Year:   Transportation Needs:   . Freight forwarder (Medical):   [name] Lack of Transportation (Non-Medical):   Physical Activity:   . Days of Exercise per Week:   . Minutes of Exercise per Session:   Stress:   . Feeling of Stress :   Social Connections:   . Frequency of Communication with Friends and Family:   . Frequency of Social Gatherings with Friends and Family:   . Attends Religious Services:   . Active Member of Clubs or Organizations:   . Attends Banker Meetings:   [name] Marital Status:    Additional Social History:  Sleep: Poor  Appetite:  Fair  Current Medications: Current Facility-Administered Medications  Medication Dose Route Frequency Provider Last Rate Last Admin  . acetaminophen (TYLENOL) tablet 650 mg  650 mg Oral Q6H PRN Jesiel Garate, [name], MD   650 mg at 04/20/20 1248  . alum & mag hydroxide-simeth (MAALOX/MYLANTA) 200-200-20 MG/5ML suspension 30 mL  30 mL Oral Q4H PRN Brooklynne Pereida T, MD      . clonazePAM (KLONOPIN) disintegrating tablet 0.5 mg  0.5 mg Oral TID Halia Franey T, MD   0.5 mg at 04/30/20 1148  . haloperidol (HALDOL) tablet 2 mg  2 mg Oral Q6H PRN Dantae Meunier T, MD   2 mg at 04/30/20 1400   Or  . haloperidol lactate (HALDOL) injection 1 mg  1 mg Intravenous Q6H PRN Zerek Litsey, [name], MD   1 mg at 04/17/20 0110  . haloperidol (HALDOL) tablet 3 mg  3 mg Oral QHS [name], MD   3 mg at 04/29/20 2112   . [START ON 05/01/2020] lamoTRIgine (LAMICTAL) tablet 150 mg  150 mg Oral q AM [name], MD       And  . lamoTRIgine (LAMICTAL) tablet 175 mg  175 mg Oral QPM [name], MD      . Melene Muller ON 05/07/2020] lamoTRIgine (LAMICTAL) tablet 175 mg  175 mg Oral BID [name], MD      . Melene Muller ON 05/01/2020] levETIRAcetam (KEPPRA) tablet 1,000 mg  1,000 mg Oral Earline Mayotte, [name], MD       And  . levETIRAcetam (KEPPRA) tablet 1,500 mg  1,500 mg Oral QPM [name], MD      . Melene Muller ON 05/07/2020] levETIRAcetam (KEPPRA) tablet 1,000 mg  1,000 mg Oral BID [name], MD      . LORazepam (ATIVAN) injection 2 mg  2 mg Intramuscular Once PRN Jalaila Caradonna T, MD      . magnesium hydroxide (MILK OF MAGNESIA) suspension 30 mL  30 mL Oral Daily PRN Ladarien Beeks T, MD      . megestrol (MEGACE) tablet 40 mg  40 mg Oral Daily [name], MD   40 mg at 04/30/20 0750  . OLANZapine (ZYPREXA) injection 10 mg  10 mg Intramuscular Q6H PRN Diania Co, [name], MD   10 mg at 04/30/20 0102  . OLANZapine zydis (ZYPREXA) disintegrating tablet 10 mg  10 mg Oral Daily Quintavious Rinck T, MD      . OLANZapine zydis (ZYPREXA) disintegrating tablet 15 mg  15 mg Oral QHS [name], MD   15 mg at 04/29/20 2113  . ondansetron (ZOFRAN) tablet 4 mg  4 mg Oral Q6H PRN Aminah Zabawa, [name], MD       Or  . ondansetron (ZOFRAN) injection 4 mg  4 mg Intravenous Q6H PRN Alajah Witman T, MD      . pyridOXINE (VITAMIN B-6) tablet 50 mg  50 mg Oral Daily [name], MD      . thiamine tablet 100 mg  100 mg Oral Daily Kirsten Mckone, [name], MD   100 mg at 04/30/20 0751  . vitamin B-12 (CYANOCOBALAMIN) tablet 1,000 mcg  1,000 mcg Oral Daily [name], MD   1,000 mcg at 04/30/20 6270    Lab Results: No results found for this or any previous visit (from the past 48 hour(s)).  Blood Alcohol level:  Lab Results  Component Value Date   [facility] <10 11/03/2019   ETH <5 12/05/2016     Metabolic Disorder Labs:  No results found for: HGBA1C, MPG No results found for: PROLACTIN No results found for: CHOL, TRIG, HDL, CHOLHDL, VLDL, LDLCALC  Physical Findings: AIMS:  , ,  ,  ,    CIWA:    COWS:     Musculoskeletal: Strength & Muscle Tone: decreased Gait & Station: unsteady Patient leans: N/A  Psychiatric Specialty Exam: Physical Exam Vitals and nursing note reviewed.  Constitutional:      Appearance: He is well-developed.  HENT:     Head: Normocephalic and atraumatic.  Eyes:     Conjunctiva/sclera: Conjunctivae normal.     Pupils: Pupils are equal, round, and reactive to light.  Cardiovascular:     Heart sounds: Normal heart sounds.  Pulmonary:     Effort: Pulmonary effort is normal.  Abdominal:     Palpations: Abdomen is soft.  Musculoskeletal:        General: Normal range of motion.     Cervical back: Normal range of motion.  Skin:    General: Skin is warm and dry.  Neurological:     General: No focal deficit present.     Mental Status: He is alert.  Psychiatric:        Attention and Perception: He is inattentive.        Mood and Affect: Affect is labile and inappropriate.        Speech: Speech is slurred and tangential.        Behavior: Behavior is uncooperative, withdrawn and combative.        Thought Content: Thought content is delusional. Thought content does not include homicidal or suicidal ideation.        Cognition and Memory: Cognition is impaired.        Judgment: Judgment is inappropriate.     Review of Systems  Unable to perform ROS: Psychiatric disorder    Blood pressure 110/70, pulse 100, temperature 98.6 F (37 C), temperature source Oral, resp. rate 18, height 5\' 9"  (1.753 m), weight 62.6 kg, SpO2 100 %.Body mass index is 20.38 kg/m.  General Appearance: Disheveled  Eye Contact:  Minimal  Speech:  Slow  Volume:  Decreased  Mood:  Euthymic  Affect:  Constricted and Inappropriate  Thought Process:  Disorganized   Orientation:  Negative  Thought Content:  Illogical  Suicidal Thoughts:  No  Homicidal Thoughts:  No  Memory:  Immediate;   Poor Recent;   Poor Remote;   Poor  Judgement:  Impaired  Insight:  Lacking  Psychomotor Activity:  Restlessness  Concentration:  Concentration: Poor  Recall:  Poor  Fund of Knowledge:  Negative  Language:  Negative  Akathisia:  No  Handed:  Right  AIMS (if indicated):     Assets:  Social Support  ADL's:  Impaired  Cognition:  Impaired,  Mild  Sleep:  Number of Hours: 3.75     Treatment Plan Summary: Daily contact with patient to assess and evaluate symptoms and progress in treatment, Medication management and Plan Patient's family are reportedly still pursuing emergency guardianship.  Last night's behavior demonstrates why the idea of discharging him home is unlikely to be workable.  Unclear why he became violent last night.  Very much appreciate input from neurology and changes to medicine.  I have also added a low-dose of Klonopin 3 times a day for agitation as well as adding olanzapine in the morning.  No other change.  Continue one-on-one observation  , MD 04/30/2020, 3:46 PM

## 2020-04-30 NOTE — Progress Notes (Signed)
Patient was released from restraint when he was calm , he verbalized he was ready for bed and he was no longer going to attack staff, vs sign maintained throughout restraint no distress noted, 15 minutes safety checks maintained will continue to monitor.

## 2020-04-30 NOTE — Progress Notes (Signed)
NEUROLOGY CONSULTATION PROGRESS NOTE   Date of service: April 30, 2020 Patient Name: Lance Ray MRN:  [id number] DOB:  1971-08-09  Brief HPI  Lance Ray is a 49 y.o. male with PMH significant for dementia, psychosis, depression, EtOH use, epilepsy on Lamotrigine 150mg  BID and Keppra 1500mg  BID admitted to Behavioral health for management of dementia, confusion and agitation.  He was seen by our team on 04/24/20 for multiple generalized seizures and his Keppra was increased to 1500mg  BID from 1000mg  BID. Lamictal was maintained at 150mg  BID.  Interval HX  Care providers noted that he was irritable and agitated. Not following instructions and pacing the room and being very loud.  On my evaluation today, he appeared restless and tremulous, most of the speech was incoherent.  Past History   Past Medical History:  Diagnosis Date  . Seizures (HCC)    Past Surgical History:  Procedure Laterality Date  . SKIN GRAFT     Family History  Problem Relation Age of Onset  . Seizures Mother   . Cerebral aneurysm Father    Social History   Socioeconomic History  . Marital status: Single    Spouse name: Not on file  . Number of children: Not on file  . Years of education: Not on file  . Highest education level: Not on file  Occupational History  . Not on file  Tobacco Use  . Smoking status: Current Some Day Smoker  . Smokeless tobacco: Current User  Vaping Use  . Vaping Use: Never used  Substance and Sexual Activity  . Alcohol use: Yes    Alcohol/week: 2.0 standard drinks    Types: 2 Shots of liquor per week    Comment: "rarely"   . Drug use: No  . Sexual activity: Yes  Other Topics Concern  . Not on file  Social History Narrative  . Not on file   Social Determinants of Health   Financial Resource Strain:   . Difficulty of Paying Living Expenses:   Food Insecurity:   . Worried About in the Last Year:   . 04/26/20 in the Last Year:    Transportation Needs:   . (Medical):   Lack of Transportation (Non-Medical):   Physical Activity:   . Days of Exercise per Week:   . Minutes of Exercise per Session:   Stress:   . Feeling of Stress :   Social Connections:   . Frequency of Communication with Friends and Family:   . Frequency of Social Gatherings with Friends and Family:   . Attends Religious Services:   . Active Member of Clubs or Organizations:   . Attends Meetings:   Programme researcher, broadcasting/film/video Marital Status:    No Known Allergies  Medications   Medications Prior to Admission  Medication Sig Dispense Refill Last Dose  . benztropine (COGENTIN) 1 MG tablet Take 1 tablet (1 mg total) by mouth 2 (two) times daily. 60 tablet 0   . clonazePAM (KLONOPIN) 0.25 MG disintegrating tablet Take 1 tablet (0.25 mg total) by mouth 2 (two) times daily. 60 tablet 0   . DULoxetine (CYMBALTA) 30 MG capsule Take 1 capsule (30 mg total) by mouth daily.  3   . haloperidol (HALDOL) 0.5 MG tablet Take 0.5 tablets (0.25 mg total) by mouth 2 (two) times daily.     Barista lamoTRIgine (LAMICTAL) 150 MG tablet Take 1 tablet (150 mg total) by mouth 2 (  two) times daily.     [name] levETIRAcetam (KEPPRA) 1000 MG tablet Take 1 tablet (1,000 mg total) by mouth 2 (two) times daily.     [name] OLANZapine zydis (ZYPREXA) 10 MG disintegrating tablet Take 1 tablet (10 mg total) by mouth at bedtime. 30 tablet 0   . thiamine 100 MG tablet Take 1 tablet (100 mg total) by mouth daily.        Vitals  Pulse Rate:  [100-121] 100 (08/06 0529) BP: (108-127)/(70-93) 110/70 (08/06 0529) SpO2:  [81 %-100 %] 100 % (08/06 0529)  Body mass index is 20.38 kg/m.  Physical Exam   General: Laying comfortably in bed; in no acute distress.  HENT: Normal oropharynx and mucosa. Normal external appearance of ears and nose. Neck: Supple, no pain or tenderness CV: No JVD. No peripheral edema. Pulmonary: Symmetric Chest rise. Normal respiratory effort. Abdomen:  Soft to touch, non-tender Ext: No cyanosis, edema, or deformity  Skin: No rash. Normal palpation of skin.   Musculoskeletal: Normal digits and nails by inspection. No clubbing.  Neurologic Examination  Mental status/Cognition: Awake, alert, not oriented to self, place and time. Speech/language: Fluent, word salad Cranial nerves:   CN II Pupils 30mm BL and reactive to light.   CN III,IV,VI EOM intact, no gaze preference or deviation, no nystagmus   CN V Did not let me touch but can feel his hands on his face BL   CN VII no asymmetry, no nasolabial fold flattening   CN VIII Appears to have normal hearing   CN IX & X    CN XI    CN XII midline tongue protrusion   Motor:  Muscle bulk: diminished, tone unable to assess as he did not let me touch him  Walks around the room with no assistance, able to walk on his toes. Moves his arms spontaneously and alteast anti gravity.  Reflexes: Did not let me touch him so unable to assess  Sensation:  Light touch Reports that he can feel his hands touching him  Did not let me touch him.   Pin prick    Temperature    Vibration   Proprioception    Coordination/Complex Motor:  - Finger to Nose intact with tremors. - Heel to shin unable to assess - Rapid alternating movement unable to assess - Gait: Stride length normal. Arm swing short. Base width normal.  Labs   Lab Results  Component Value Date   NA 141 04/20/2020   K 4.3 04/20/2020   CL 106 04/20/2020   CO2 28 04/20/2020   GLUCOSE 88 04/20/2020   BUN 19 04/20/2020   CREATININE 1.02 04/20/2020   CALCIUM 9.0 04/20/2020   ALBUMIN 4.3 04/04/2020   AST 17 04/04/2020   ALT 13 04/04/2020   ALKPHOS 78 04/04/2020   BILITOT 0.7 04/04/2020   GFRNONAA >60 04/20/2020   GFRAA >60 04/20/2020    Imaging and Diagnostic studies  IMPRESSION: Normal brain MRI.  No acute intracranial abnormality identified.  Impression   Lance Ray is a 49 y.o. male with PMH significant for  dementia, psychosis, depression, EtOH use, epilepsy on Lamotrigine 150mg  BID and Keppra 1500mg  BID admitted to Behavioral health for management of dementia, confusion and agitation. He was noted to be more agitated and aggressive towards staff last night. This started about a week after increasing his Keppra dose to 1500mg  BID from 1000mg  BID. Neuro exam was limited due to poor participation but he was noted to be restless, tremulous, incoherent speech,  BL dilated pupils but no focal deficit.  I do suspect that part of this could be from Keppra. However still would be difficult to explain the entirety of picture. With the BL pupillary dilation, restlessness, agitation, anticholenergic toxicity is another consideration but he is only on Zyprexa and Haldol which have a low relative anticholinergic potency. Would recommend avoiding Anticholinergic medications at this time.  Recommendations  - I ordered  Lamictal 175mg  in AM and 150mg  in PM for 1 week and then increase to 175mg  BID from week 2 and onwards. - I ordered Keppra 1000mg  in AM and 1500mg  in PM for 1 week, then cut it down to 1000mg  BID from week 2 onwards. - I ordered PO Vit B6 while he is on Keppra. There is some evidence that this may decrease agitation and psychosis 2/2 Keppra. - Recommend Ativan 2mg  for any seizure lasting more than 5 mins. - Seizure precautions - Avoid Anticholinergic medications. ______________________________________________________________________   Thank you for the opportunity to take part in the care of this patient. If you have any further questions, please contact the neurology consultation attending.  Signed,  [facility] Pager Number 

## 2020-04-30 NOTE — Progress Notes (Addendum)
Pt is alert and oriented to person only. Pt is restless, irritable, paces the hallways, was reporting visual hallucinations. Pt was noted to be pulling the curtains in his room down, and when redirected by his sitter and then this writer, pt was difficult to redirect. Pt was unable to verbalized when asked why he was pulling at the curtains. Pt then said, "Do you see that Rhino sitting on the window sill?" Pt was provided with reality orientation, however, pt insisted that there was a Rhino in the room, and kept describing it and urgently asking for the sitter and this writer to look at this Rhino he said he saw. Pt states, "Look over there! Don't you see it? That Rhino right sitting right over there." Pt then pointed at the Rhino, "See it?!"   Pt saw the neurologist/MD today, noting pt's pupils are dilated. Pt's appetite is good, pt denies suicidal and homicidal ideation, denies feelings of depression and anxiety. Pt is able to answer simple questions with brief answers appropriately at times, but when asked opened ended questions, pt will ramble nonsensically, unable to formulate a sentence. Pt makes poor eye contact. No pain noted. Pt attended RT group, required redirection. Pt will continue to be with 1:1 staff sitter for safety, and will continue to monitor pt per Q15 minute face checks and monitor for safety and progress.

## 2020-04-30 NOTE — BHH Counselor (Signed)
CSW called pt's sister to provide an update on the patient.    CSW updated patient's sister on pt's reported hallucinations, restlessness and agitation.  Sister reports that Dagoberto Reef of Court has assigned a "ad Print production planner who is going to be doing his investigation on what needs to be done".  She reports that the GAL is [address].      She reports that the court date for guardianship 06/01/2020 at 11AM.  Penni Homans, MSW, LCSW 04/30/2020 3:55 PM

## 2020-04-30 NOTE — Progress Notes (Signed)
Pt ran and impulsively charged in an aggressive manner toward the 1:1 sitter, appears to be responding to internal stimuli, pt redirected by several staff, given PRN Haldol PO 2mg  for agitation. Will continue to monitor pt.

## 2020-05-01 MED ORDER — LAMOTRIGINE 25 MG PO TABS: 175.0000 mg | ORAL_TABLET | Freq: Two times a day (BID) | ORAL | Status: DC

## 2020-05-01 MED ORDER — LORAZEPAM 2 MG/ML IJ SOLN
2.0000 mg | Freq: Once | INTRAMUSCULAR | Status: AC | PRN
  Administered 2020-05-07: 2 mg via INTRAMUSCULAR
  Filled 2020-05-01: qty 1

## 2020-05-01 MED ORDER — MEGESTROL ACETATE 20 MG PO TABS
160.0000 mg | ORAL_TABLET | Freq: Every day | ORAL | Status: DC
  Administered 2020-05-02 – 2020-05-07 (×6): 160 mg via ORAL
  Filled 2020-05-01 (×7): qty 8

## 2020-05-01 MED ORDER — LEVETIRACETAM 500 MG PO TABS
1000.0000 mg | ORAL_TABLET | Freq: Two times a day (BID) | ORAL | Status: DC
  Filled 2020-05-01: qty 2

## 2020-05-01 MED ORDER — CLONAZEPAM 0.25 MG PO TBDP
0.5000 mg | ORAL_TABLET | Freq: Two times a day (BID) | ORAL | Status: DC
  Administered 2020-05-02: 0.5 mg via ORAL
  Filled 2020-05-01: qty 2

## 2020-05-01 MED ORDER — OLANZAPINE 10 MG PO TBDP
20.0000 mg | ORAL_TABLET | Freq: Every day | ORAL | Status: DC
  Administered 2020-05-01: 20 mg via ORAL
  Filled 2020-05-01: qty 2
  Filled 2020-05-01: qty 4
  Filled 2020-05-01: qty 2

## 2020-05-01 NOTE — Progress Notes (Signed)
[facility] MD Progress Note  05/01/2020 12:35 PM IMRI LOR  MRN:  [id number] Subjective: [name] who was admitted on 04/09/2020 with a history of a seizure disorder who became delirious requiring sedation in the intensive care unit.  He apparently also had psychosis at that time.  Objective: Patient is seen and examined.  [name] with the above-stated past psychiatric history seen in follow-up.  Patient is familiar to me from previous rounding on the unit.  I last saw the patient on 04/23/2020.  At that time he appeared to be improving from a cognitive status.  Unfortunately since last time I saw the patient he has deteriorated significantly.  Unfortunately he had 3 witnessed seizures on 04/24/2020.  His Keppra was increased to 1500 mg twice daily.  Unfortunately he became significantly agitated after that, and his dosage of Keppra was reduced.  His Lamictal was increased.  Today he is back to where he was when I first saw him.  His speech is mumbled, he is unstable on his feet, and quite disorganized.  He also appears to having visual hallucinations.  He is holding things with his hands during my evaluation today.  He is quite lethargic.  His current psychiatric medications include Klonopin 0.5 mg 3 times daily, haloperidol 3 mg p.o. nightly, Lamictal 150 mg p.o. every morning and 175 mg p.o. every afternoon with a titration 275 mg p.o. twice daily.  Keppra 1000 mg p.o. daily and 1500 mg p.o. nightly with a reduction to 1000 mg p.o. twice daily on Saturday 8/14.  Megace for appetite stimulation.  Zyprexa 10 mg every 6 hours as needed agitation, 10 mg p.o. daily and 15 mg p.o. nightly.  His vital signs are stable, he is afebrile.  His pulse oximetry on room air is 97%.  He only slept 3.75 hours last night, but does appear to be significantly sedated today.  Most recent laboratories from 7/27 show essentially normal electrolytes his ammonia from 7/10 was 28.  His CBC from  7/27 was normal.  Principal Problem: Dementia (HCC) Diagnosis: Principal Problem:   Dementia (HCC) Active Problems:   Recurrent seizures (HCC)   Seizure disorder (HCC)   Psychosis (HCC)   Failure to thrive in adult   Stuttering   Depression   Anemia due to vitamin B12 deficiency  Total Time spent with patient: 20 minutes  Past Psychiatric History: See admission H&P  Past Medical History:  Past Medical History:  Diagnosis Date  . Seizures (HCC)     Past Surgical History:  Procedure Laterality Date  . SKIN GRAFT     Family History:  Family History  Problem Relation Age of Onset  . Seizures Mother   . Cerebral aneurysm Father    Family Psychiatric  History: See admission H&P Social History:  Social History   Substance and Sexual Activity  Alcohol Use Yes  . Alcohol/week: 2.0 standard drinks  . Types: 2 Shots of liquor per week   Comment: "rarely"      Social History   Substance and Sexual Activity  Drug Use No    Social History   Socioeconomic History  . Marital status: Single    Spouse name: Not on file  . Number of children: Not on file  . Years of education: Not on file  . Highest education level: Not on file  Occupational History  . Not on file  Tobacco Use  . Smoking status: Current Some Day Smoker  .  Smokeless tobacco: Current User  Vaping Use  . Vaping Use: Never used  Substance and Sexual Activity  . Alcohol use: Yes    Alcohol/week: 2.0 standard drinks    Types: 2 Shots of liquor per week    Comment: "rarely"   . Drug use: No  . Sexual activity: Yes  Other Topics Concern  . Not on file  Social History Narrative  . Not on file   Social Determinants of Health   Financial Resource Strain:   . Difficulty of Paying Living Expenses:   Food Insecurity:   . Worried About Programme researcher, broadcasting/film/video in the Last Year:   . Barista in the Last Year:   Transportation Needs:   . Freight forwarder (Medical):   [name] Lack of Transportation  (Non-Medical):   Physical Activity:   . Days of Exercise per Week:   . Minutes of Exercise per Session:   Stress:   . Feeling of Stress :   Social Connections:   . Frequency of Communication with Friends and Family:   . Frequency of Social Gatherings with Friends and Family:   . Attends Religious Services:   . Active Member of Clubs or Organizations:   . Attends Banker Meetings:   [name] Marital Status:    Additional Social History:                         Sleep: Poor  Appetite:  Poor  Current Medications: Current Facility-Administered Medications  Medication Dose Route Frequency Provider Last Rate Last Admin  . acetaminophen (TYLENOL) tablet 650 mg  650 mg Oral Q6H PRN [name], [name], MD   650 mg at 04/20/20 1248  . alum & mag hydroxide-simeth (MAALOX/MYLANTA) 200-200-20 MG/5ML suspension 30 mL  30 mL Oral Q4H PRN [name], [name], MD      . clonazePAM (KLONOPIN) disintegrating tablet 0.5 mg  0.5 mg Oral TID [name], [name], MD   0.5 mg at 05/01/20 7035  . haloperidol (HALDOL) tablet 2 mg  2 mg Oral Q6H PRN [name], [name], MD   2 mg at 04/30/20 1400   Or  . haloperidol lactate (HALDOL) injection 1 mg  1 mg Intravenous Q6H PRN [name], [name], MD   1 mg at 04/17/20 0110  . haloperidol (HALDOL) tablet 3 mg  3 mg Oral QHS [name], MD   3 mg at 04/30/20 2108  . lamoTRIgine (LAMICTAL) tablet 150 mg  150 mg Oral q AM [name], MD   150 mg at 05/01/20 0818   And  . lamoTRIgine (LAMICTAL) tablet 175 mg  175 mg Oral QPM [name], MD   175 mg at 04/30/20 1844  . [START ON 05/08/2020] lamoTRIgine (LAMICTAL) tablet 175 mg  175 mg Oral BID [name], MD      . levETIRAcetam (KEPPRA) tablet 1,000 mg  1,000 mg Oral Earline Mayotte, [name], MD   1,000 mg at 05/01/20 0093   And  . levETIRAcetam (KEPPRA) tablet 1,500 mg  1,500 mg Oral QPM [name], MD   1,500 mg at 04/30/20 1844  . [START ON 05/08/2020] levETIRAcetam (KEPPRA)  tablet 1,000 mg  1,000 mg Oral BID [name], MD      . magnesium hydroxide (MILK OF MAGNESIA) suspension 30 mL  30 mL Oral Daily PRN [name], [name], MD      . megestrol (MEGACE) tablet 40 mg  40 mg Oral Daily Terril Chestnut,  Marlane MingleGreg Lawson, MD   40 mg at 05/01/20 0814  . OLANZapine (ZYPREXA) injection 10 mg  10 mg Intramuscular Q6H PRN [name], Jackquline DenmarkJohn T, MD   10 mg at 05/01/20 0306  . OLANZapine zydis (ZYPREXA) disintegrating tablet 10 mg  10 mg Oral Daily [name], Jackquline DenmarkJohn T, MD   10 mg at 05/01/20 0815  . OLANZapine zydis (ZYPREXA) disintegrating tablet 15 mg  15 mg Oral QHS Antonieta Pertlary, Deontrae Drinkard Lawson, MD   15 mg at 04/30/20 2108  . ondansetron (ZOFRAN) tablet 4 mg  4 mg Oral Q6H PRN [name], [name], MD       Or  . ondansetron (ZOFRAN) injection 4 mg  4 mg Intravenous Q6H PRN [name], [name], MD      . pyridOXINE (VITAMIN B-6) tablet 50 mg  50 mg Oral Daily Erick BlinksKhaliqdina, Salman, MD   50 mg at 05/01/20 0815  . thiamine tablet 100 mg  100 mg Oral Daily [name], Jackquline DenmarkJohn T, MD   100 mg at 05/01/20 0815  . vitamin B-12 (CYANOCOBALAMIN) tablet 1,000 mcg  1,000 mcg Oral Daily Alford HighlandWieting, Richard, MD   1,000 mcg at 05/01/20 0815    Lab Results: No results found for this or any previous visit (from the past 48 hour(s)).  Blood Alcohol level:  Lab Results  Component Value Date   ETH <10 11/03/2019   ETH <5 12/05/2016    Metabolic Disorder Labs: No results found for: HGBA1C, MPG No results found for: PROLACTIN No results found for: CHOL, TRIG, HDL, CHOLHDL, VLDL, LDLCALC  Physical Findings: AIMS:  , ,  ,  ,    CIWA:    COWS:     Musculoskeletal: Strength & Muscle Tone: decreased Gait & Station: unable to stand Patient leans: N/A  Psychiatric Specialty Exam: Physical Exam Vitals and nursing note reviewed.  HENT:     Head: Normocephalic and atraumatic.  Pulmonary:     Effort: Pulmonary effort is normal.  Neurological:     Mental Status: He is alert.     Review of Systems  Blood pressure 122/82, pulse  90, temperature 97.7 F (36.5 C), temperature source Oral, resp. rate 18, height 5\' 9"  (1.753 m), weight 62.6 kg, SpO2 97 %.Body mass index is 20.38 kg/m.  General Appearance: Disheveled  Eye Contact:  Minimal  Speech:  Garbled and Slow  Volume:  Decreased  Mood:  Dysphoric  Affect:  Blunt  Thought Process:  Disorganized  Orientation:  Negative  Thought Content:  Hallucinations: Visual  Suicidal Thoughts:  No  Homicidal Thoughts:  No  Memory:  Immediate;   Poor Recent;   Poor Remote;   Poor  Judgement:  Impaired  Insight:  Shallow  Psychomotor Activity:  Psychomotor Retardation  Concentration:  Concentration: Poor and Attention Span: Poor  Recall:  Poor  Fund of Knowledge:  Poor  Language:  Poor  Akathisia:  Negative  Handed:  Right  AIMS (if indicated):     Assets:  Desire for Improvement Resilience  ADL's:  Impaired  Cognition:  Impaired,  Severe  Sleep:  Number of Hours: 3.75     Treatment Plan Summary: Daily contact with patient to assess and evaluate symptoms and progress in treatment, Medication management and Plan : Patient is seen and examined.  [name] with the above-stated past psychiatric history who is seen in follow-up.   Diagnosis: 1.  Dementia 2.  Psychosis 3.  Seizure disorder 4.  Failure to thrive 5.  Anemia 6.  Poor  intake  Pertinent findings on examination today: 1.  Significant decrease in motor strength and discoordination. 2.  Visual hallucinations 3.  Unsteady gait and weakness 4.  Worsening seizure disorder  Plan: 1.  Continue clonazepam 0.5 mg p.o. 3 times daily for anxiety and seizure disorder. 2.  Continue haloperidol 2 mg every 6 hours as needed agitation and this can be either IM or p.o. secondary to psychosis and agitation. 3.  Continue Haldol 3 mg p.o. nightly for psychosis. 4.  Continue Lamictal as per neurology for seizure disorder. 5.  Continue Keppra as per neurology for seizure disorder. 6.  Increase  Megace to 40 mg p.o. twice daily for appetite stimulation. 7.  Continue Zyprexa injection 10 mg IM every 6 hours as needed agitation. 8.  Change Zyprexa Zydis to 10 mg p.o. daily and 20 mg p.o. nightly for psychosis and mood stability.  This should also help his sleeping issues. 9.  Continue Zofran 4 mg every 6 hours as needed nausea or vomiting. 10.  Continue thiamine 100 mg p.o. daily for nutritional supplementation. 11.  Continue vitamin B12 1000 mcg p.o. daily for nutritional supplementation. 12.  Continue vitamin B6 50 mg p.o. daily for nutritional supplementation. 13.  Disposition planning-in progress.  [name], MD 05/01/2020, 12:35 PM

## 2020-05-01 NOTE — [facility] Group Notes (Signed)
[facility] Group Notes: (Clinical Social Work)   05/01/2020      Type of Therapy:  Group Therapy   Participation Level:  Did Not Attend - was invited individually by Nurse/MHT and chose not to attend.   Susa Simmonds, LCSWA 05/01/2020  2:54 PM

## 2020-05-01 NOTE — Progress Notes (Signed)
Pt is confused, impulsive, irritable, attempts to elope, suddenly hops out of bed and grabs his things stating he is going home. Pt is medication compliant, naps often, appetite is good, denies pain, can be redirected from some of his impulsive behaviors. No distress noted, none reported, pt voices no complaints. Will continue to monitor pt per Q15 minute face checks and monitor for safety and progress, also will continue to monitor pt for safety with a 1:1 staff sitter. Pt is a high fall risk, gait is unsteady, use of gait bed and staff assist to ambulate.

## 2020-05-01 NOTE — Progress Notes (Signed)
Patient on 1;1 no distress noted he appears restless, thoughts are disorganized and incoherent, his speech is slurred not interacting appropriately with peers and staff. Patient is sometimes physically aggressive towards staff he was frequently redirected for safety  And medicated x 1 for aggressive behavior.

## 2020-05-02 MED ORDER — CLONAZEPAM 0.25 MG PO TBDP
0.5000 mg | ORAL_TABLET | Freq: Every day | ORAL | Status: DC
  Administered 2020-05-03 – 2020-05-07 (×5): 0.5 mg via ORAL
  Filled 2020-05-02 (×5): qty 2

## 2020-05-02 MED ORDER — OLANZAPINE 5 MG PO TBDP
5.0000 mg | ORAL_TABLET | Freq: Every day | ORAL | Status: DC
  Administered 2020-05-03 – 2020-05-07 (×5): 5 mg via ORAL
  Filled 2020-05-02 (×5): qty 1

## 2020-05-02 MED ORDER — MELATONIN 5 MG PO TABS
2.5000 mg | ORAL_TABLET | Freq: Every day | ORAL | Status: DC
  Administered 2020-05-02 – 2020-05-07 (×6): 2.5 mg via ORAL
  Filled 2020-05-02 (×6): qty 1

## 2020-05-02 MED ORDER — OLANZAPINE 5 MG PO TBDP
25.0000 mg | ORAL_TABLET | Freq: Every day | ORAL | Status: DC
  Administered 2020-05-02 – 2020-05-07 (×6): 25 mg via ORAL
  Filled 2020-05-02 (×6): qty 5

## 2020-05-02 NOTE — Plan of Care (Signed)
  Problem: Education: Goal: Knowledge of [facility] Education information/materials will improve Outcome: Progressing Goal: Emotional status will improve Outcome: Progressing Goal: Mental status will improve Outcome: Progressing Goal: Verbalization of understanding the information provided will improve Outcome: Progressing   Problem: Health Behavior/Discharge Planning: Goal: Identification of resources available to assist in meeting health care needs will improve Outcome: Progressing Goal: Compliance with treatment plan for underlying cause of condition will improve Outcome: Progressing   Problem: Physical Regulation: Goal: Ability to maintain clinical measurements within normal limits will improve Outcome: Progressing   Problem: Safety: Goal: Periods of time without injury will increase Outcome: Progressing   Problem: Education: Goal: Verbalization of understanding the information provided will improve Outcome: Progressing   Problem: Coping: Goal: Ability to demonstrate appropriate behavior when angry will improve Outcome: Progressing Goal: Ability to identify and develop effective coping behavior will improve Outcome: Progressing Goal: Ability to interact with others will improve Outcome: Progressing   

## 2020-05-02 NOTE — [facility] Group Notes (Signed)
[facility] Group Notes: (Clinical Social Work)   05/02/2020      Type of Therapy:  Group Therapy   Participation Level:  Did Not Attend - was invited individually by Nurse/MHT and chose not to attend.   Susa Simmonds, LCSWA 05/02/2020  4:53 PM

## 2020-05-02 NOTE — Progress Notes (Signed)
[facility] MD Progress Note  05/02/2020 12:00 PM Lance Ray  MRN:  [id number] Subjective:  [name] who was admitted on 04/09/2020 with a history of a seizure disorder who became delirious requiring sedation in the intensive care unit.  He apparently also had psychosis at that time.  Objective: Patient is seen and examined.  [name] with the above-stated past psychiatric history who is seen in follow-up.  He looks much better today.  He appears to be a little bit less disorganized, and more awake and alert.  He still is disoriented to a degree, but answers questions more appropriately today.  Neurology had seen the patient today, and recommended continued treatment with his anticonvulsant doses at their current state.  No new laboratories.  His vital signs are stable, he is afebrile.  His sleep continues to be poor with only 3.75 hours last night, but he does sleep a great deal during the day.  His p.o. intake continues to be poor.  His Megace was increased to 160 mg p.o. daily yesterday.  His Klonopin was reduced to twice daily yesterday.  Principal Problem: Dementia (HCC) Diagnosis: Principal Problem:   Dementia (HCC) Active Problems:   Recurrent seizures (HCC)   Seizure disorder (HCC)   Psychosis (HCC)   Failure to thrive in adult   Stuttering   Depression   Anemia due to vitamin B12 deficiency  Total Time spent with patient: 20 minutes  Past Psychiatric History: See admission H&P  Past Medical History:  Past Medical History:  Diagnosis Date  . Seizures (HCC)     Past Surgical History:  Procedure Laterality Date  . SKIN GRAFT     Family History:  Family History  Problem Relation Age of Onset  . Seizures Mother   . Cerebral aneurysm Father    Family Psychiatric  History: See admission H&P Social History:  Social History   Substance and Sexual Activity  Alcohol Use Yes  . Alcohol/week: 2.0 standard drinks  . Types: 2 Shots of  liquor per week   Comment: "rarely"      Social History   Substance and Sexual Activity  Drug Use No    Social History   Socioeconomic History  . Marital status: Single    Spouse name: Not on file  . Number of children: Not on file  . Years of education: Not on file  . Highest education level: Not on file  Occupational History  . Not on file  Tobacco Use  . Smoking status: Current Some Day Smoker  . Smokeless tobacco: Current User  Vaping Use  . Vaping Use: Never used  Substance and Sexual Activity  . Alcohol use: Yes    Alcohol/week: 2.0 standard drinks    Types: 2 Shots of liquor per week    Comment: "rarely"   . Drug use: No  . Sexual activity: Yes  Other Topics Concern  . Not on file  Social History Narrative  . Not on file   Social Determinants of Health   Financial Resource Strain:   . Difficulty of Paying Living Expenses:   Food Insecurity:   . Worried About Programme researcher, broadcasting/film/video in the Last Year:   . Barista in the Last Year:   Transportation Needs:   . Freight forwarder (Medical):   [name] Lack of Transportation (Non-Medical):   Physical Activity:   . Days of Exercise per Week:   . Minutes of Exercise  per Session:   Stress:   . Feeling of Stress :   Social Connections:   . Frequency of Communication with Friends and Family:   . Frequency of Social Gatherings with Friends and Family:   . Attends Religious Services:   . Active Member of Clubs or Organizations:   . Attends BankerClub or Organization Meetings:   [name]. Marital Status:    Additional Social History:                         Sleep: Poor  Appetite:  Poor  Current Medications: Current Facility-Administered Medications  Medication Dose Route Frequency Provider Last Rate Last Admin  . acetaminophen (TYLENOL) tablet 650 mg  650 mg Oral Q6H PRN [name], Jackquline DenmarkJohn T, MD   650 mg at 04/20/20 1248  . alum & mag hydroxide-simeth (MAALOX/MYLANTA) 200-200-20 MG/5ML suspension 30 mL  30 mL  Oral Q4H PRN [name], [name], MD      . clonazePAM (KLONOPIN) disintegrating tablet 0.5 mg  0.5 mg Oral BID Antonieta Pertlary, Arshad Oberholzer Lawson, MD   0.5 mg at 05/02/20 [id number]  . haloperidol (HALDOL) tablet 2 mg  2 mg Oral Q6H PRN [name], Jackquline DenmarkJohn T, MD   2 mg at 05/01/20 2031   Or  . haloperidol lactate (HALDOL) injection 1 mg  1 mg Intravenous Q6H PRN [name], Jackquline DenmarkJohn T, MD   1 mg at 04/17/20 0110  . haloperidol (HALDOL) tablet 3 mg  3 mg Oral QHS Roselind Messierao, Ramakrishna, MD   3 mg at 05/01/20 2133  . lamoTRIgine (LAMICTAL) tablet 150 mg  150 mg Oral q AM Erick BlinksKhaliqdina, Salman, MD   150 mg at 05/02/20 0839   And  . lamoTRIgine (LAMICTAL) tablet 175 mg  175 mg Oral QPM Erick BlinksKhaliqdina, Salman, MD   175 mg at 05/01/20 1821  . [START ON 05/08/2020] lamoTRIgine (LAMICTAL) tablet 175 mg  175 mg Oral BID Erick BlinksKhaliqdina, Salman, MD      . levETIRAcetam (KEPPRA) tablet 1,000 mg  1,000 mg Oral Earline MayotteBH-q7a Khaliqdina, Terrilee FilesSalman, MD   1,000 mg at 05/02/20 0836   And  . levETIRAcetam (KEPPRA) tablet 1,500 mg  1,500 mg Oral QPM Erick BlinksKhaliqdina, Salman, MD   1,500 mg at 05/01/20 1821  . [START ON 05/08/2020] levETIRAcetam (KEPPRA) tablet 1,000 mg  1,000 mg Oral BID Erick BlinksKhaliqdina, Salman, MD      . LORazepam (ATIVAN) injection 2 mg  2 mg Intramuscular Once PRN Antonieta Pertlary, Alaysha Jefcoat Lawson, MD      . magnesium hydroxide (MILK OF MAGNESIA) suspension 30 mL  30 mL Oral Daily PRN [name], [name], MD      . megestrol (MEGACE) tablet 160 mg  160 mg Oral Daily Antonieta Pertlary, Kailee Essman Lawson, MD   160 mg at 05/02/20 0840  . melatonin tablet 2.5 mg  2.5 mg Oral QHS Erick BlinksKhaliqdina, Salman, MD      . OLANZapine [facility](ZYPREXA) injection 10 mg  10 mg Intramuscular Q6H PRN [name], Jackquline DenmarkJohn T, MD   10 mg at 05/01/20 0306  . OLANZapine zydis (ZYPREXA) disintegrating tablet 10 mg  10 mg Oral Daily [name], Jackquline DenmarkJohn T, MD   10 mg at 05/02/20 0837  . OLANZapine zydis (ZYPREXA) disintegrating tablet 20 mg  20 mg Oral QHS Antonieta Pertlary, Trashaun Streight Lawson, MD   20 mg at 05/01/20 2134  . ondansetron (ZOFRAN) tablet 4 mg  4 mg Oral Q6H PRN  [name], [name], MD       Or  . ondansetron (ZOFRAN) injection 4 mg  4 mg  Intravenous Q6H PRN [name], [name], MD      . pyridOXINE (VITAMIN B-6) tablet 50 mg  50 mg Oral Daily [name], MD   50 mg at 05/02/20 2951  . thiamine tablet 100 mg  100 mg Oral Daily [name], [name], MD   100 mg at 05/02/20 0836  . vitamin B-12 (CYANOCOBALAMIN) tablet 1,000 mcg  1,000 mcg Oral Daily [name], MD   1,000 mcg at 05/02/20 8841    Lab Results: No results found for this or any previous visit (from the past 48 hour(s)).  Blood Alcohol level:  Lab Results  Component Value Date   ETH <10 11/03/2019   ETH <5 12/05/2016    Metabolic Disorder Labs: No results found for: HGBA1C, MPG No results found for: PROLACTIN No results found for: CHOL, TRIG, HDL, CHOLHDL, VLDL, LDLCALC  Physical Findings: AIMS:  , ,  ,  ,    CIWA:    COWS:     Musculoskeletal: Strength & Muscle Tone: decreased Gait & Station: unable to stand Patient leans: N/A  Psychiatric Specialty Exam: Physical Exam Vitals and nursing note reviewed.  HENT:     Head: Normocephalic and atraumatic.  Pulmonary:     Effort: Pulmonary effort is normal.  Neurological:     General: No focal deficit present.     Mental Status: He is alert.     Review of Systems  Blood pressure 122/82, pulse 90, temperature 97.7 F (36.5 C), temperature source Oral, resp. rate 18, height 5\' 9"  (1.753 m), weight 62.6 kg, SpO2 97 %.Body mass index is 20.38 kg/m.  General Appearance: Disheveled  Eye Contact:  Minimal  Speech:  Garbled and Slow  Volume:  Decreased  Mood:  Dysphoric  Affect:  Flat  Thought Process:  Disorganized and Descriptions of Associations: Circumstantial  Orientation:  Negative  Thought Content:  Illogical  Suicidal Thoughts:  No  Homicidal Thoughts:  No  Memory:  Immediate;   Poor Recent;   Poor Remote;   Poor  Judgement:  Impaired  Insight:  Lacking  Psychomotor Activity:  Decreased  Concentration:   Concentration: Poor and Attention Span: Poor  Recall:  Poor  Fund of Knowledge:  Poor  Language:  Fair  Akathisia:  Negative  Handed:  Right  AIMS (if indicated):     Assets:  Desire for Improvement Resilience  ADL's:  Impaired  Cognition:  Impaired,  Severe  Sleep:  Number of Hours: 3.75     Treatment Plan Summary: Daily contact with patient to assess and evaluate symptoms and progress in treatment, Medication management and Plan : Patient is seen and examined.  [name] with the above-stated past psychiatric history who is seen in follow-up.   Diagnosis: 1.  Dementia 2.  Psychosis 3.  Seizure disorder 4.  Failure to thrive 5.  Anemia 6.  Poor intake  Pertinent findings on examination today: 1.  Patient's motor strength and discoordination continues to be poor, but better than yesterday. 2.  No gross evidence of visual hallucinations today. 3.  Continued unsteady gait and weakness. 4.  No seizure activity in the last 24 hours. 5.  Continued poor nightly sleep, but a great deal of sleep during the day. 6.  Continued poor p.o. intake.  Plan: 1.  Decrease clonazepam to 0.5 mg p.o. daily as needed anxiety or seizures. 2.  Continue haloperidol 2 mg every 6 hours as needed agitation and this can be either IM or p.o. secondary  to psychosis and agitation. 3.  Continue Haldol 3 mg p.o. nightly for psychosis. 4.  Continue Lamictal as per neurology for seizure disorder. 5.  Continue Keppra as per neurology for seizure disorder. 6.  Continue Megace to 160 mg p.o. twice daily for appetite stimulation. 7.  Continue Zyprexa injection 10 mg IM every 6 hours as needed agitation. 8.  Change Zyprexa Zydis to 5 mg p.o. daily and 25 mg p.o. nightly for psychosis, insomnia and mood stability. 9.  Continue Zofran 4 mg every 6 hours as needed nausea or vomiting. 10.  Continue thiamine 100 mg p.o. daily for nutritional supplementation. 11.  Continue vitamin B12 1000 mcg p.o.  daily for nutritional supplementation. 12.  Continue vitamin B6 50 mg p.o. daily for nutritional supplementation. 13.  Disposition planning-in progress.  [name], MD 05/02/2020, 12:00 PM

## 2020-05-02 NOTE — Progress Notes (Signed)
Pt has been alert and oriented to person only, confused otherwise. Pt has been with 1:1 sitter for safety, pt is a fall risk also uses gait belt. Pt has been medication complaint, denies suicidal and homicidal ideation, denies feelings of depression and anxiety. Pt has been resting quietly in bed, no distress noted, none reported, pt voices no complaints. Pt has a good appetite and has been napping on and off today. Pt saw the neurologist today. Will continue to monitor pt per Q15 minute face checks and monitor for safety and progress.

## 2020-05-02 NOTE — Tx Team (Signed)
Interdisciplinary Treatment and Diagnostic Plan Update  05/02/2020 Time of Session: 9:10 AM Lance Ray MRN: [id number]  Principal Diagnosis: Dementia (HCC)  Secondary Diagnoses: Principal Problem:   Dementia (HCC) Active Problems:   Recurrent seizures (HCC)   Seizure disorder (HCC)   Psychosis (HCC)   Failure to thrive in adult   Stuttering   Depression   Anemia due to vitamin B12 deficiency   Current Medications:  Current Facility-Administered Medications  Medication Dose Route Frequency Provider Last Rate Last Admin  . acetaminophen (TYLENOL) tablet 650 mg  650 mg Oral Q6H PRN [name], [name], MD   650 mg at 04/20/20 1248  . alum & mag hydroxide-simeth (MAALOX/MYLANTA) 200-200-20 MG/5ML suspension 30 mL  30 mL Oral Q4H PRN [name], [name], MD      . clonazePAM (KLONOPIN) disintegrating tablet 0.5 mg  0.5 mg Oral BID [name], MD   0.5 mg at 05/02/20 9381  . haloperidol (HALDOL) tablet 2 mg  2 mg Oral Q6H PRN [name], [name], MD   2 mg at 05/01/20 2031   Or  . haloperidol lactate (HALDOL) injection 1 mg  1 mg Intravenous Q6H PRN [name], [name], MD   1 mg at 04/17/20 0110  . haloperidol (HALDOL) tablet 3 mg  3 mg Oral QHS [name], MD   3 mg at 05/01/20 2133  . lamoTRIgine (LAMICTAL) tablet 150 mg  150 mg Oral q AM [name], MD   150 mg at 05/02/20 0839   And  . lamoTRIgine (LAMICTAL) tablet 175 mg  175 mg Oral QPM [name], MD   175 mg at 05/01/20 1821  . [START ON 05/08/2020] lamoTRIgine (LAMICTAL) tablet 175 mg  175 mg Oral BID [name], MD      . levETIRAcetam (KEPPRA) tablet 1,000 mg  1,000 mg Oral Earline Mayotte, [name], MD   1,000 mg at 05/02/20 0836   And  . levETIRAcetam (KEPPRA) tablet 1,500 mg  1,500 mg Oral QPM [name], MD   1,500 mg at 05/01/20 1821  . [START ON 05/08/2020] levETIRAcetam (KEPPRA) tablet 1,000 mg  1,000 mg Oral BID [name], MD      . LORazepam (ATIVAN) injection 2 mg  2 mg  Intramuscular Once PRN [name], MD      . magnesium hydroxide (MILK OF MAGNESIA) suspension 30 mL  30 mL Oral Daily PRN [name], [name], MD      . megestrol (MEGACE) tablet 160 mg  160 mg Oral Daily [name], MD   160 mg at 05/02/20 0840  . OLANZapine (ZYPREXA) injection 10 mg  10 mg Intramuscular Q6H PRN [name], [name], MD   10 mg at 05/01/20 0306  . OLANZapine zydis (ZYPREXA) disintegrating tablet 10 mg  10 mg Oral Daily [name], [name], MD   10 mg at 05/02/20 0837  . OLANZapine zydis (ZYPREXA) disintegrating tablet 20 mg  20 mg Oral QHS [name], MD   20 mg at 05/01/20 2134  . ondansetron (ZOFRAN) tablet 4 mg  4 mg Oral Q6H PRN [name], [name], MD       Or  . ondansetron (ZOFRAN) injection 4 mg  4 mg Intravenous Q6H PRN [name], [name], MD      . pyridOXINE (VITAMIN B-6) tablet 50 mg  50 mg Oral Daily [name], MD   50 mg at 05/02/20 0175  . thiamine tablet 100 mg  100 mg Oral Daily [name], [name], MD  100 mg at 05/02/20 0836  . vitamin B-12 (CYANOCOBALAMIN) tablet 1,000 mcg  1,000 mcg Oral Daily [name], MD   1,000 mcg at 05/02/20 0017   PTA Medications: Medications Prior to Admission  Medication Sig Dispense Refill Last Dose  . benztropine (COGENTIN) 1 MG tablet Take 1 tablet (1 mg total) by mouth 2 (two) times daily. 60 tablet 0   . clonazePAM (KLONOPIN) 0.25 MG disintegrating tablet Take 1 tablet (0.25 mg total) by mouth 2 (two) times daily. 60 tablet 0   . DULoxetine (CYMBALTA) 30 MG capsule Take 1 capsule (30 mg total) by mouth daily.  3   . haloperidol (HALDOL) 0.5 MG tablet Take 0.5 tablets (0.25 mg total) by mouth 2 (two) times daily.     [name] lamoTRIgine (LAMICTAL) 150 MG tablet Take 1 tablet (150 mg total) by mouth 2 (two) times daily.     [name] levETIRAcetam (KEPPRA) 1000 MG tablet Take 1 tablet (1,000 mg total) by mouth 2 (two) times daily.     [name] OLANZapine zydis (ZYPREXA) 10 MG disintegrating tablet Take 1 tablet (10 mg total) by  mouth at bedtime. 30 tablet 0   . thiamine 100 MG tablet Take 1 tablet (100 mg total) by mouth daily.       Patient Stressors: Financial difficulties Health problems Medication change or noncompliance Substance abuse Traumatic event  Patient Strengths: Motivation for treatment/growth Supportive family/friends  Treatment Modalities: Medication Management, Group therapy, Case management,  1 to 1 session with clinician, Psychoeducation, Recreational therapy.   Physician Treatment Plan for Primary Diagnosis: Dementia [facility]) Long Term Goal(s): Improvement in symptoms so as ready for discharge Improvement in symptoms so as ready for discharge   Short Term Goals: Ability to identify and develop effective coping behaviors will improve Ability to maintain clinical measurements within normal limits will improve Compliance with prescribed medications will improve Ability to demonstrate self-control will improve Compliance with prescribed medications will improve  Medication Management: Evaluate patient's response, side effects, and tolerance of medication regimen.  Therapeutic Interventions: 1 to 1 sessions, Unit Group sessions and Medication administration.  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  Therapeutic Interventions: 1 to 1 sessions, Unit Group sessions and Medication administration.  Evaluation of Outcomes: Progressing   RN Treatment Plan for Primary Diagnosis: Dementia (HCC) Long Term Goal(s): Knowledge of disease and therapeutic regimen to maintain health will improve  Short Term Goals: Ability to demonstrate self-control, Ability to identify and develop effective coping behaviors will improve and Compliance with prescribed medications will improve  Medication Management: RN will administer medications as ordered by provider, will assess and evaluate patient's response and provide education to patient for prescribed medication. RN will report any adverse and/or side effects to prescribing provider.  Therapeutic Interventions:  1 on 1 counseling sessions, Psychoeducation, Medication administration, Evaluate responses to treatment, Monitor vital signs and CBGs as ordered, Perform/monitor CIWA, COWS, AIMS and Fall Risk screenings as ordered, Perform wound care treatments as ordered.  Evaluation of Outcomes: Progressing   LCSW Treatment Plan for Primary Diagnosis: Dementia (HCC) Long Term Goal(s): Safe transition to appropriate next level of care at discharge, Engage patient in therapeutic group addressing interpersonal concerns.  Short Term Goals: Engage patient in aftercare planning with referrals and resources  Therapeutic Interventions: Assess for all discharge needs, 1 to 1 time with Social worker, Explore available resources and support systems, Assess for adequacy in community support network, Educate family and significant other(s) on suicide prevention, Complete Psychosocial Assessment, Interpersonal group therapy.  Evaluation of Outcomes: Progressing   Progress in Treatment: Attending groups: No. Participating in groups: No. Taking medication  as prescribed: Yes. Toleration medication: Yes. Family/Significant other contact made: Yes, individual(s) contacted:  pt gave verbal permission to speak with his sisters. Patient understands diagnosis: No. Discussing patient identified problems/goals with staff: Yes. Medical problems stabilized or resolved: No. Denies suicidal/homicidal ideation: Yes. Issues/concerns per patient self-inventory: No. Other: Patient requires 1:1 to move around millieu  New problem(s) identified: No, Describe:  None  New Short Term/Long Term Goal(s): medication stabilization, elimination of SI thoughts, development of comprehensive mental wellness plan  Patient Goals: Patient spoke about his seizures. Patient is still not aware of the date/time of day.   Discharge Plan or Barriers: Patient has still not transitioned to a nursing home due to funding. Family is working on [employer]. Update 04/22/20-Pt sister is pursuing guardianship, medicaid and disability on behalf of the pt. Sister expressed concern about pt not being able to properly care for himself and request pt be referred to a SNF. Sister has phone interview with social security on 8/13. Update 04/26/20- No change at this time. DC plan TBD. Update 05/02/20: No changes   Reason for Continuation of Hospitalization: Other; describe Dementia   Estimated Length of Stay: TBD   Attendees: Patient: 05/02/2020 10:38 AM  Physician: Landry Mellow, MD 05/02/2020 10:38 AM  Nursing: Torrie Mayers, RN 05/02/2020 10:38 AM  RN Care Manager: 05/02/2020 10:38 AM  Social Worker: Susa Simmonds, LCSWA 05/02/2020 10:38 AM  Recreational Therapist:  05/02/2020 10:38 AM  Other:  05/02/2020 10:38 AM  Other:  05/02/2020 10:38 AM  Other: 05/02/2020 10:38 AM    Scribe for Treatment Team: Susa Simmonds, LCSWA 05/02/2020 10:38 AM

## 2020-05-02 NOTE — Progress Notes (Signed)
NEUROLOGY CONSULTATION PROGRESS NOTE   Date of service: May 02, 2020 Patient Name: Lance Ray MRN:  [id number] DOB:  1971/06/24  Brief HPI  Lance Ray is a 49 y.o. male with PMH significant for dementia, psychosis, depression, EtOH use, epilepsy on Lamotrigine 150mg  BID and Keppra 1500mg  BID admitted to Behavioral health for management of dementia, confusion and agitation. He was seen by our team on 04/24/20 for multiple generalized seizures and his Keppra was increased to 1500mg  BID from 1000mg  BID. Lamictal was maintained at 150mg  BID.  He was noted to be significantly agitated and so we were asked to see him again. I suspect that KEppra may have contributed to it. We are gradually weaning down his Keppra to 1000mg  BID and increasing his Lamictal to 175mg  BID and started on Vit B6 for Keppra induced agitation.  Interval Hx  He has been doing much better today. He is more calm and redirectable. Ate his breakfast this AM. Woke up from sleep to answer my questions and participated in exam. Denies any hallucinations.  Past History   Past Medical History:  Diagnosis Date  . Seizures (HCC)    Past Surgical History:  Procedure Laterality Date  . SKIN GRAFT     Family History  Problem Relation Age of Onset  . Seizures Mother   . Cerebral aneurysm Father    Social History   Socioeconomic History  . Marital status: Single    Spouse name: Not on file  . Number of children: Not on file  . Years of education: Not on file  . Highest education level: Not on file  Occupational History  . Not on file  Tobacco Use  . Smoking status: Current Some Day Smoker  . Smokeless tobacco: Current User  Vaping Use  . Vaping Use: Never used  Substance and Sexual Activity  . Alcohol use: Yes    Alcohol/week: 2.0 standard drinks    Types: 2 Shots of liquor per week    Comment: "rarely"   . Drug use: No  . Sexual activity: Yes  Other Topics Concern  . Not on file  Social History  Narrative  . Not on file   Social Determinants of Health   Financial Resource Strain:   . Difficulty of Paying Living Expenses:   Food Insecurity:   . Worried About in the Last Year:   . 04/26/20 in the Last Year:   Transportation Needs:   . (Medical):   Lack of Transportation (Non-Medical):   Physical Activity:   . Days of Exercise per Week:   . Minutes of Exercise per Session:   Stress:   . Feeling of Stress :   Social Connections:   . Frequency of Communication with Friends and Family:   . Frequency of Social Gatherings with Friends and Family:   . Attends Religious Services:   . Active Member of Clubs or Organizations:   . Attends Meetings:   Marital Status:    No Known Allergies  Medications   Medications Prior to Admission  Medication Sig Dispense Refill Last Dose  . benztropine (COGENTIN) 1 MG tablet Take 1 tablet (1 mg total) by mouth 2 (two) times daily. 60 tablet 0   . clonazePAM (KLONOPIN) 0.25 MG disintegrating tablet Take 1 tablet (0.25 mg total) by mouth 2 (two) times daily. 60 tablet 0   . DULoxetine (CYMBALTA) 30 MG capsule Take 1  capsule (30 mg total) by mouth daily.  3   . haloperidol (HALDOL) 0.5 MG tablet Take 0.5 tablets (0.25 mg total) by mouth 2 (two) times daily.     [name] lamoTRIgine (LAMICTAL) 150 MG tablet Take 1 tablet (150 mg total) by mouth 2 (two) times daily.     [name] levETIRAcetam (KEPPRA) 1000 MG tablet Take 1 tablet (1,000 mg total) by mouth 2 (two) times daily.     [name] OLANZapine zydis (ZYPREXA) 10 MG disintegrating tablet Take 1 tablet (10 mg total) by mouth at bedtime. 30 tablet 0   . thiamine 100 MG tablet Take 1 tablet (100 mg total) by mouth daily.        Vitals     Body mass index is 20.38 kg/m.  Physical Exam   General: Laying comfortably in bed; in no acute distress.  HENT: Normal oropharynx and mucosa. Normal external appearance of ears and nose. Neck:  Supple, no pain or tenderness CV: No JVD. No peripheral edema. Pulmonary: Symmetric Chest rise. Normal respiratory effort. Abdomen: Soft to touch, non-tender Ext: No cyanosis, edema, or deformity  Skin: No rash. Normal palpation of skin.   Musculoskeletal: Normal digits and nails by inspection. No clubbing.  Neurologic Examination  Mental status/Cognition: Awake, alert, oriented to self, but not to place, month and year. Unable to do simple calculations. Unable to follow 3 step commands. Speech/language: Fluent, mumbles and tangential thought process. Cranial nerves:   CN II Pupils equal and reactive to light, no VF deficits   CN III,IV,VI EOM intact, no gaze preference or deviation, no nystagmus   CN V normal sensation in V1, V2, and V3 segments bilaterally   CN VII no asymmetry, no nasolabial fold flattening   CN VIII normal hearing to speech   CN IX & X normal palatal elevation, no uvular deviation   CN XI 5/5 head turn and 5/5 shoulder shrug bilaterally   CN XII midline tongue protrusion   Motor:  Muscle bulk: decreased, tone normal, pronator drift none Mvmt Root Nerve  Muscle Right Left Comments  SA C5/6 Ax Deltoid 5 5   EF C5/6 Mc Biceps 5 5   EE C6/7/8 Rad Triceps 5 5   WF C6/7 Med FCR 5 5   WE C7/8 PIN ECU 5 5   F Ab C8/T1 U ADM/FDI 5 5   HF L1/2/3 Fem Illopsoas 5 5   KE L2/3/4 Fem Quad 5 5   DF L4/5 D Peron Tib Ant 5 5   PF S1/2 Tibial Grc/Sol 5 5    Reflexes:  Right Left Comments  Pectoralis      Biceps (C5/6) 1 1   Brachioradialis (C5/6) 2 2    Triceps (C6/7) 1 1    Patellar (L3/4) 1 1    Achilles (S1) 1 1    Hoffman      Plantar withdraws withdraws   Jaw jerk    Sensation:  Light touch Intact to light touch throughout   Pin prick    Temperature    Vibration   Proprioception    Coordination/Complex Motor:  - Finger to Nose intact BL. - Heel to shin intact BL - Rapid alternating movement normal - Gait: Stride length normal. Arm swing normal. Base  width normal.  Labs   Lab Results  Component Value Date   NA 141 04/20/2020   K 4.3 04/20/2020   CL 106 04/20/2020   CO2 28 04/20/2020   GLUCOSE 88 04/20/2020   BUN 19  04/20/2020   CREATININE 1.02 04/20/2020   CALCIUM 9.0 04/20/2020   ALBUMIN 4.3 04/04/2020   AST 17 04/04/2020   ALT 13 04/04/2020   ALKPHOS 78 04/04/2020   BILITOT 0.7 04/04/2020   GFRNONAA >60 04/20/2020   GFRAA >60 04/20/2020    Imaging and Diagnostic studies  IMPRESSION: Normal brain MRI.  No acute intracranial abnormality identified.  Impression   Lance Ray is a 49 y.o. male with PMH significant for dementia, psychosis, depression, EtOH use, epilepsy on Lamotrigine 150mg  BID and Keppra 1500mg  BID admitted to Behavioral health for management of dementia, confusion and agitation. He was noted to be more agitated and aggressive towards staff last night. This started about a week after increasing his Keppra dose to 1500mg  BID from 1000mg  BID. I suspect that KEppra may have contributed to it. We are gradually weaning down his Keppra to 1000mg  BID and increasing his Lamictal to 175mg  BID and started on Vit B6 for Keppra induced agitation. He has been more calm over the last day. Neuro exam today consistent with executive dysfunction and no focal deficit.  Recommendations  - Continue Lamictal uptitration as planned. Currently on 175mg  in AM and 150mg  in PM until 8/13 and then increase to 175mg  BID from there. - Continue Keppra downtitration. Currently on 1000mg  in AM and 1500mg  in PM until 8/13, then cut it down to 1000mg  BID from there onwards. - Continue Vit B6 50mg  daily. Some evidence that this helps with Keppra induced agitation. - I ordered Ativan 2mg  once PRN for any seizure lasting more than 5 mins. - Seizure precautions - I ordered Melatonin 3mg  qhs to help with sleep. ______________________________________________________________________   Thank you for the opportunity to take part in the care of  this patient. If you have any further questions, please contact the neurology consultation attending.  Signed,  [facility] Pager Number 

## 2020-05-02 NOTE — Progress Notes (Signed)
Patient presents with labile mood. Goes from being tearful and appearing frightened to irritated and agitated to confusion. Through all of it was easily redirectable. He denies SI/HI/AVH depression and anxiety at this encounter. Although he denies all he is worried and mentions concern for his cat that has been locked in his home since he has been hospitalized. He received PRN med to help with agitation earlier in the evening when he presented with agitation while  requesting assistance with being discharged.  Stating he wants to go home and does not understand why he is being held. Patient was receptive to the education and reminder that he is not yet stable on his feet and still in need of assistance with his ADL's. He received his prescribed meds and tolerated without incident.  He remains safe on the unit with 1:1 observation and 15 minute safety checks.   Cleo Butler-Nicholson, LPN

## 2020-05-02 NOTE — Plan of Care (Signed)
Patient denies SI / HI / AVH. Patient is 1:1 for seizure precautions. Patient is mildly confused but is able to participate in assessment. Patient denies anxiety / depression at this time. Patient is in room for the shift. Patient is adherent with scheduled medication. Patient will be monitored and provided support as needed.    Problem: Education: Goal: Knowledge of [facility] Education information/materials will improve Outcome: Not Progressing Goal: Emotional status will improve Outcome: Not Progressing Goal: Mental status will improve Outcome: Not Progressing Goal: Verbalization of understanding the information provided will improve Outcome: Not Progressing

## 2020-05-03 NOTE — Plan of Care (Signed)
Patient has been restless reporting that he is ready to leave. Mild confusion noted. Patient was reoriented and was encouraged to discuss discharge planning with providers in AM. Received HS medications. Refused HS snack.  Remains on 1:1 for safety.

## 2020-05-03 NOTE — Progress Notes (Signed)
[facility] MD Progress Note  05/03/2020 5:30 PM Lance Ray  MRN:  [id number] Subjective: Patient with dementia and epilepsy.  No new complaints.  Confused today.  Has not been acting out though over the weekend Principal Problem: Dementia (HCC) Diagnosis: Principal Problem:   Dementia (HCC) Active Problems:   Recurrent seizures (HCC)   Seizure disorder (HCC)   Psychosis (HCC)   Failure to thrive in adult   Stuttering   Depression   Anemia due to vitamin B12 deficiency  Total Time spent with patient: 20 minutes  Past Psychiatric History: Alcohol abuse epilepsy dementia  Past Medical History:  Past Medical History:  Diagnosis Date  . Seizures (HCC)     Past Surgical History:  Procedure Laterality Date  . SKIN GRAFT     Family History:  Family History  Problem Relation Age of Onset  . Seizures Mother   . Cerebral aneurysm Father    Family Psychiatric  History: None reported Social History:  Social History   Substance and Sexual Activity  Alcohol Use Yes  . Alcohol/week: 2.0 standard drinks  . Types: 2 Shots of liquor per week   Comment: "rarely"      Social History   Substance and Sexual Activity  Drug Use No    Social History   Socioeconomic History  . Marital status: Single    Spouse name: Not on file  . Number of children: Not on file  . Years of education: Not on file  . Highest education level: Not on file  Occupational History  . Not on file  Tobacco Use  . Smoking status: Current Some Day Smoker  . Smokeless tobacco: Current User  Vaping Use  . Vaping Use: Never used  Substance and Sexual Activity  . Alcohol use: Yes    Alcohol/week: 2.0 standard drinks    Types: 2 Shots of liquor per week    Comment: "rarely"   . Drug use: No  . Sexual activity: Yes  Other Topics Concern  . Not on file  Social History Narrative  . Not on file   Social Determinants of Health   Financial Resource Strain:   . Difficulty of Paying Living Expenses:    Food Insecurity:   . Worried About Programme researcher, broadcasting/film/video in the Last Year:   . Barista in the Last Year:   Transportation Needs:   . Freight forwarder (Medical):   [name] Lack of Transportation (Non-Medical):   Physical Activity:   . Days of Exercise per Week:   . Minutes of Exercise per Session:   Stress:   . Feeling of Stress :   Social Connections:   . Frequency of Communication with Friends and Family:   . Frequency of Social Gatherings with Friends and Family:   . Attends Religious Services:   . Active Member of Clubs or Organizations:   . Attends Banker Meetings:   [name] Marital Status:    Additional Social History:                         Sleep: Fair  Appetite:  Fair  Current Medications: Current Facility-Administered Medications  Medication Dose Route Frequency Provider Last Rate Last Admin  . acetaminophen (TYLENOL) tablet 650 mg  650 mg Oral Q6H PRN Elisabet Gutzmer, [name], MD   650 mg at 04/20/20 1248  . alum & mag hydroxide-simeth (MAALOX/MYLANTA) 200-200-20 MG/5ML suspension 30 mL  30 mL Oral Q4H  PRN Omair Dettmer, [name], MD      . clonazePAM Scarlette Calico) disintegrating tablet 0.5 mg  0.5 mg Oral Daily [name], MD   0.5 mg at 05/03/20 0810  . haloperidol (HALDOL) tablet 2 mg  2 mg Oral Q6H PRN Siddhanth Denk, [name], MD   2 mg at 05/01/20 2031   Or  . haloperidol lactate (HALDOL) injection 1 mg  1 mg Intravenous Q6H PRN Aisling Emigh, [name], MD   1 mg at 04/17/20 0110  . haloperidol (HALDOL) tablet 3 mg  3 mg Oral QHS [name], MD   3 mg at 05/02/20 2118  . lamoTRIgine (LAMICTAL) tablet 150 mg  150 mg Oral q AM [name], MD   150 mg at 05/03/20 7116   And  . lamoTRIgine (LAMICTAL) tablet 175 mg  175 mg Oral QPM [name], MD   175 mg at 05/03/20 1724  . [START ON 05/08/2020] lamoTRIgine (LAMICTAL) tablet 175 mg  175 mg Oral BID [name], MD      . levETIRAcetam (KEPPRA) tablet 1,000 mg  1,000 mg Oral Earline Mayotte, [name], MD   1,000 mg at 05/03/20 0606   And  . levETIRAcetam (KEPPRA) tablet 1,500 mg  1,500 mg Oral QPM [name], MD   1,500 mg at 05/03/20 1726  . [START ON 05/08/2020] levETIRAcetam (KEPPRA) tablet 1,000 mg  1,000 mg Oral BID [name], MD      . LORazepam (ATIVAN) injection 2 mg  2 mg Intramuscular Once PRN [name], MD      . magnesium hydroxide (MILK OF MAGNESIA) suspension 30 mL  30 mL Oral Daily PRN Jaymeson Mengel T, MD      . megestrol (MEGACE) tablet 160 mg  160 mg Oral Daily [name], MD   160 mg at 05/03/20 0810  . melatonin tablet 2.5 mg  2.5 mg Oral QHS [name], MD   2.5 mg at 05/02/20 2118  . OLANZapine (ZYPREXA) injection 10 mg  10 mg Intramuscular Q6H PRN Maryanne Huneycutt, [name], MD   10 mg at 05/01/20 0306  . OLANZapine zydis (ZYPREXA) disintegrating tablet 25 mg  25 mg Oral QHS [name], MD   25 mg at 05/02/20 2117  . OLANZapine zydis (ZYPREXA) disintegrating tablet 5 mg  5 mg Oral Daily [name], MD   5 mg at 05/03/20 0810  . ondansetron (ZOFRAN) tablet 4 mg  4 mg Oral Q6H PRN Vence Lalor T, MD       Or  . ondansetron (ZOFRAN) injection 4 mg  4 mg Intravenous Q6H PRN Nataliah Hatlestad T, MD      . pyridOXINE (VITAMIN B-6) tablet 50 mg  50 mg Oral Daily [name], MD   50 mg at 05/03/20 0810  . thiamine tablet 100 mg  100 mg Oral Daily Vinh Sachs, [name], MD   100 mg at 05/03/20 0810  . vitamin B-12 (CYANOCOBALAMIN) tablet 1,000 mcg  1,000 mcg Oral Daily [name], MD   1,000 mcg at 05/03/20 5790    Lab Results: No results found for this or any previous visit (from the past 48 hour(s)).  Blood Alcohol level:  Lab Results  Component Value Date   ETH <10 11/03/2019   ETH <5 12/05/2016    Metabolic Disorder Labs: No results found for: HGBA1C, MPG No results found for: PROLACTIN No results found for: CHOL, TRIG, HDL, CHOLHDL, VLDL, LDLCALC  Physical Findings: AIMS:  , ,  ,  ,  CIWA:     COWS:     Musculoskeletal: Strength & Muscle Tone: decreased Gait & Station: unsteady Patient leans: N/A  Psychiatric Specialty Exam: Physical Exam Vitals and nursing note reviewed.  Constitutional:      Appearance: He is well-developed.  HENT:     Head: Normocephalic and atraumatic.  Eyes:     Conjunctiva/sclera: Conjunctivae normal.     Pupils: Pupils are equal, round, and reactive to light.  Cardiovascular:     Heart sounds: Normal heart sounds.  Pulmonary:     Effort: Pulmonary effort is normal.  Abdominal:     Palpations: Abdomen is soft.  Musculoskeletal:        General: Normal range of motion.     Cervical back: Normal range of motion.  Skin:    General: Skin is warm and dry.  Neurological:     General: No focal deficit present.     Mental Status: He is alert.  Psychiatric:        Attention and Perception: He is inattentive.        Mood and Affect: Affect is blunt.        Speech: Speech is delayed.     Review of Systems  Constitutional: Negative.   HENT: Negative.   Eyes: Negative.   Respiratory: Negative.   Cardiovascular: Negative.   Gastrointestinal: Negative.   Musculoskeletal: Negative.   Skin: Negative.   Neurological: Negative.   Psychiatric/Behavioral: Negative.     Blood pressure (!) 125/109, pulse 100, temperature 98.6 F (37 C), temperature source Oral, resp. rate 18, height 5\' 9"  (1.753 m), weight 62.6 kg, SpO2 100 %.Body mass index is 20.38 kg/m.  General Appearance: Disheveled  Eye Contact:  Minimal  Speech:  Slow  Volume:  Decreased  Mood:  Euthymic  Affect:  Constricted  Thought Process:  Disorganized  Orientation:  Other:  None  Thought Content:  Illogical  Suicidal Thoughts:  No  Homicidal Thoughts:  No  Memory:  Immediate;   Fair Recent;   Fair Remote;   Fair  Judgement:  Impaired  Insight:  Shallow  Psychomotor Activity:  Decreased  Concentration:  Concentration: Poor  Recall:  Poor  Fund of Knowledge:  Poor   Language:  Poor  Akathisia:  No  Handed:  Right  AIMS (if indicated):     Assets:  Social Support  ADL's:  Impaired  Cognition:  Impaired,  Severe  Sleep:  Number of Hours: 7.75     Treatment Plan Summary: Plan Family working on guardianship meanwhile patient does not have capacity right now to make decisions.  Continue antiepileptic medicine and one-on-one monitoring  , MD 05/03/2020, 5:30 PM

## 2020-05-03 NOTE — Progress Notes (Signed)
1:1 observation note.    Patient observed with 1:1 safety sitter in his room sleeping. Pt. In no visible distress and pain not suspected to be present.  

## 2020-05-03 NOTE — Progress Notes (Signed)
1:1 observation note.    Patient observed with 1:1 safety sitter in his room sleeping. Pt. In no visible distress and pain not suspected to be present.

## 2020-05-03 NOTE — Progress Notes (Signed)
1:1 observation note.    Upon arrival to unit patient observed with 1:1 Recruitment consultant. Pt. In no visible distress and pain not suspected to be present.

## 2020-05-03 NOTE — Progress Notes (Signed)
Recreation Therapy Notes  Date: 05/03/2020  Time: 9:30 am   Location: Craft room    Behavioral response: N/A   Intervention Topic: Goals   Discussion/Intervention: Patient did not attend group.   Clinical Observations/Feedback:  Patient did not attend group.   Lance Ray LRT/CTRS        [name] 05/03/2020 10:51 AM

## 2020-05-03 NOTE — Plan of Care (Signed)
D: Pt. During assessments this morning observed in his room resting in bed with 1:1 present for safety. Pt. During our engagement was pleasant and cooperative, presentation was calm, and no signs of distress or physical pain suspected to be present. Pt. Endorsed his mood as, "good", affect was flat and constricted with decreased range/lability. Pt. Denied physical pain and verbalized he has been tolerating his medicines thus far. Pt. Denied si/hi/avh, verbalized ability to continue to remain safe on the unit. Pt. And this Clinical research associate reviewed high falls risk interventions for his safety that patient verbalized understanding. Pt. Orientation appeared grossly intact to this Clinical research associate. Pt. Endorsed eating and sleeping are normal, reported eating, "all of my breakfast, I cleared the plate". Pt. Educated to alert 1:1 or this Clinical research associate if any needs, complaints, or pain become present. Pt. Did not appear to be RTIS during our engagement.    A: 1:1 safety sitter in place/maintained for safety. Patient is provided with education throughout shift when appropriate and able.  Patient is given/offered medications per orders. Patient is encouraged to attend groups, participate in unit activities and continue with plan of care. Pt. Chart and plans of care reviewed. Pt. Given support and encouragement when appropriate and able.    R: Patient is complaint with medication and unit procedures thus far. Pt. Observed eating good thus far, observed up this morning in the day room with peers eating breakfast. Pt. Thus far besides breakfast, has been observed resting in bed. Pt. Participation around the unit lacking, no group attendance thus far.     Problem: Education: Goal: Knowledge of [facility] Education information/materials will improve Outcome: Progressing Goal: Emotional status will improve Outcome: Progressing Goal: Mental status will improve Outcome: Progressing Goal: Verbalization of understanding the information  provided will improve Outcome: Progressing   Problem: Health Behavior/Discharge Planning: Goal: Identification of resources available to assist in meeting health care needs will improve Outcome: Progressing Goal: Compliance with treatment plan for underlying cause of condition will improve Outcome: Progressing   Problem: Physical Regulation: Goal: Ability to maintain clinical measurements within normal limits will improve Outcome: Progressing   Problem: Safety: Goal: Periods of time without injury will increase Outcome: Progressing   Problem: Education: Goal: Verbalization of understanding the information provided will improve Outcome: Progressing   Problem: Coping: Goal: Ability to demonstrate appropriate behavior when angry will improve Outcome: Progressing Goal: Ability to identify and develop effective coping behavior will improve Outcome: Progressing Goal: Ability to interact with others will improve Outcome: Progressing

## 2020-05-04 MED ORDER — LORAZEPAM 2 MG PO TABS
ORAL_TABLET | ORAL | Status: AC
  Filled 2020-05-04: qty 1

## 2020-05-04 MED ORDER — DIPHENHYDRAMINE HCL 25 MG PO CAPS: 50.0000 mg | ORAL_CAPSULE | Freq: Once | ORAL | Status: DC

## 2020-05-04 MED ORDER — DIPHENHYDRAMINE HCL 50 MG/ML IJ SOLN: 50.0000 mg | Freq: Once | INTRAMUSCULAR | Status: DC

## 2020-05-04 MED ORDER — LORAZEPAM 2 MG PO TABS
2.0000 mg | ORAL_TABLET | Freq: Once | ORAL | Status: AC
  Administered 2020-05-04: 2 mg via ORAL

## 2020-05-04 MED ORDER — LORAZEPAM 2 MG/ML IJ SOLN: 2.0000 mg | Freq: Once | INTRAMUSCULAR | Status: AC

## 2020-05-04 MED ORDER — HALOPERIDOL LACTATE 5 MG/ML IJ SOLN: 5.0000 mg | Freq: Once | INTRAMUSCULAR | Status: DC

## 2020-05-04 NOTE — Progress Notes (Signed)
Recreation Therapy Notes   Date: 05/04/2020  Time: 9:30 am   Location: Craft room    Behavioral response: N/A   Intervention Topic: Relaxation    Discussion/Intervention: Patient did not attend group.   Clinical Observations/Feedback:  Patient did not attend group.   Hadlei Stitt LRT/CTRS        Kang Ishida 05/04/2020 11:26 AM

## 2020-05-04 NOTE — Progress Notes (Signed)
[facility] MD Progress Note  05/04/2020 3:55 PM Lance Ray  MRN:  [id number] Subjective: Follow-up for this patient with dementia and probably brain injury.  Today the patient is withdrawn and mumbling.  He is eating a little bit but is not very conversant.  We note that on days where he has been up all night the night before which was the case last night he tends to be much more withdrawn and confused during the day.  Unclear what triggers these episodes as his medicine is not changing. Principal Problem: Dementia (HCC) Diagnosis: Principal Problem:   Dementia (HCC) Active Problems:   Recurrent seizures (HCC)   Seizure disorder (HCC)   Psychosis (HCC)   Failure to thrive in adult   Stuttering   Depression   Anemia due to vitamin B12 deficiency  Total Time spent with patient: 30 minutes  Past Psychiatric History: Past history of alcohol abuse  Past Medical History:  Past Medical History:  Diagnosis Date  . Seizures (HCC)     Past Surgical History:  Procedure Laterality Date  . SKIN GRAFT     Family History:  Family History  Problem Relation Age of Onset  . Seizures Mother   . Cerebral aneurysm Father    Family Psychiatric  History: See previous Social History:  Social History   Substance and Sexual Activity  Alcohol Use Yes  . Alcohol/week: 2.0 standard drinks  . Types: 2 Shots of liquor per week   Comment: "rarely"      Social History   Substance and Sexual Activity  Drug Use No    Social History   Socioeconomic History  . Marital status: Single    Spouse name: Not on file  . Number of children: Not on file  . Years of education: Not on file  . Highest education level: Not on file  Occupational History  . Not on file  Tobacco Use  . Smoking status: Current Some Day Smoker  . Smokeless tobacco: Current User  Vaping Use  . Vaping Use: Never used  Substance and Sexual Activity  . Alcohol use: Yes    Alcohol/week: 2.0 standard drinks    Types: 2 Shots  of liquor per week    Comment: "rarely"   . Drug use: No  . Sexual activity: Yes  Other Topics Concern  . Not on file  Social History Narrative  . Not on file   Social Determinants of Health   Financial Resource Strain:   . Difficulty of Paying Living Expenses:   Food Insecurity:   . Worried About Programme researcher, broadcasting/film/video in the Last Year:   . Barista in the Last Year:   Transportation Needs:   . Freight forwarder (Medical):   [name] Lack of Transportation (Non-Medical):   Physical Activity:   . Days of Exercise per Week:   . Minutes of Exercise per Session:   Stress:   . Feeling of Stress :   Social Connections:   . Frequency of Communication with Friends and Family:   . Frequency of Social Gatherings with Friends and Family:   . Attends Religious Services:   . Active Member of Clubs or Organizations:   . Attends Banker Meetings:   [name] Marital Status:    Additional Social History:                         Sleep: Fair  Appetite:  Fair  Current  Medications: Current Facility-Administered Medications  Medication Dose Route Frequency Provider Last Rate Last Admin  . acetaminophen (TYLENOL) tablet 650 mg  650 mg Oral Q6H PRN Mckyle Solanki, [name], MD   650 mg at 04/20/20 1248  . alum & mag hydroxide-simeth (MAALOX/MYLANTA) 200-200-20 MG/5ML suspension 30 mL  30 mL Oral Q4H PRN Conni Knighton T, MD      . clonazePAM (KLONOPIN) disintegrating tablet 0.5 mg  0.5 mg Oral Daily [name], MD   0.5 mg at 05/04/20 1322  . diphenhydrAMINE (BENADRYL) capsule 50 mg  50 mg Oral Once Gillermo Murdoch, NP       Or  . diphenhydrAMINE (BENADRYL) injection 50 mg  50 mg Intramuscular Once Gillermo Murdoch, NP      . haloperidol (HALDOL) tablet 2 mg  2 mg Oral Q6H PRN Ellee Wawrzyniak T, MD   2 mg at 05/04/20 0002   Or  . haloperidol lactate (HALDOL) injection 1 mg  1 mg Intravenous Q6H PRN Quanisha Drewry, [name], MD   1 mg at 04/17/20 0110  . haloperidol (HALDOL)  tablet 3 mg  3 mg Oral QHS [name], MD   3 mg at 05/03/20 2008  . haloperidol lactate (HALDOL) injection 5 mg  5 mg Intramuscular Once Gillermo Murdoch, NP      . lamoTRIgine (LAMICTAL) tablet 150 mg  150 mg Oral q AM [name], MD   150 mg at 05/04/20 0737   And  . lamoTRIgine (LAMICTAL) tablet 175 mg  175 mg Oral QPM [name], MD   175 mg at 05/03/20 1724  . [START ON 05/08/2020] lamoTRIgine (LAMICTAL) tablet 175 mg  175 mg Oral BID [name], MD      . levETIRAcetam (KEPPRA) tablet 1,000 mg  1,000 mg Oral Earline Mayotte, [name], MD   1,000 mg at 05/04/20 0737   And  . levETIRAcetam (KEPPRA) tablet 1,500 mg  1,500 mg Oral QPM [name], MD   1,500 mg at 05/03/20 1726  . [START ON 05/08/2020] levETIRAcetam (KEPPRA) tablet 1,000 mg  1,000 mg Oral BID [name], MD      . LORazepam (ATIVAN) injection 2 mg  2 mg Intramuscular Once PRN [name], MD      . magnesium hydroxide (MILK OF MAGNESIA) suspension 30 mL  30 mL Oral Daily PRN Iam Lipson T, MD      . megestrol (MEGACE) tablet 160 mg  160 mg Oral Daily [name], MD   160 mg at 05/04/20 0735  . melatonin tablet 2.5 mg  2.5 mg Oral QHS [name], MD   2.5 mg at 05/03/20 2008  . OLANZapine (ZYPREXA) injection 10 mg  10 mg Intramuscular Q6H PRN Arkin Imran, [name], MD   10 mg at 05/04/20 0422  . OLANZapine zydis (ZYPREXA) disintegrating tablet 25 mg  25 mg Oral QHS [name], MD   25 mg at 05/03/20 2008  . OLANZapine zydis (ZYPREXA) disintegrating tablet 5 mg  5 mg Oral Daily [name], MD   5 mg at 05/04/20 0735  . ondansetron (ZOFRAN) tablet 4 mg  4 mg Oral Q6H PRN Anay Rathe T, MD       Or  . ondansetron (ZOFRAN) injection 4 mg  4 mg Intravenous Q6H PRN Saryiah Bencosme T, MD      . pyridOXINE (VITAMIN B-6) tablet 50 mg  50 mg Oral Daily [name], MD   50 mg at 05/04/20 0736  . thiamine tablet 100 mg  100 mg Oral Daily Amrit Cress,  Jackquline DenmarkJohn T, MD   100 mg at 05/04/20 0734  . vitamin B-12 (CYANOCOBALAMIN) tablet 1,000 mcg  1,000 mcg Oral Daily Alford HighlandWieting, Richard, MD   1,000 mcg at 05/04/20 [id number]    Lab Results: No results found for this or any previous visit (from the past 48 hour(s)).  Blood Alcohol level:  Lab Results  Component Value Date   ETH <10 11/03/2019   ETH <5 12/05/2016    Metabolic Disorder Labs: No results found for: HGBA1C, MPG No results found for: PROLACTIN No results found for: CHOL, TRIG, HDL, CHOLHDL, VLDL, LDLCALC  Physical Findings: AIMS:  , ,  ,  ,    CIWA:    COWS:     Musculoskeletal: Strength & Muscle Tone: decreased Gait & Station: unsteady Patient leans: N/A  Psychiatric Specialty Exam: Physical Exam Vitals and nursing note reviewed.  Constitutional:      Appearance: He is well-developed.  HENT:     Head: Normocephalic and atraumatic.  Eyes:     Conjunctiva/sclera: Conjunctivae normal.     Pupils: Pupils are equal, round, and reactive to light.  Cardiovascular:     Heart sounds: Normal heart sounds.  Pulmonary:     Effort: Pulmonary effort is normal.  Abdominal:     Palpations: Abdomen is soft.  Musculoskeletal:        General: Normal range of motion.     Cervical back: Normal range of motion.  Skin:    General: Skin is warm and dry.  Neurological:     General: No focal deficit present.     Mental Status: He is alert.  Psychiatric:        Attention and Perception: He is inattentive.        Mood and Affect: Affect is blunt.        Speech: He is noncommunicative.        Behavior: Behavior is slowed and withdrawn.        Thought Content: Thought content does not include homicidal or suicidal ideation.        Cognition and Memory: Cognition is impaired. Memory is impaired.        Judgment: Judgment is inappropriate.     Review of Systems  Unable to perform ROS: Mental status change    Blood pressure (!) 125/109, pulse 100, temperature 98.6 F (37 C), temperature  source Oral, resp. rate 18, height 5\' 9"  (1.753 m), weight 62.6 kg, SpO2 100 %.Body mass index is 20.38 kg/m.  General Appearance: Disheveled  Eye Contact:  Minimal  Speech:  Slow  Volume:  Decreased  Mood:  Depressed and Dysphoric  Affect:  Constricted  Thought Process:  Disorganized  Orientation:  Negative  Thought Content:  Negative  Suicidal Thoughts:  No  Homicidal Thoughts:  No  Memory:  Negative  Judgement:  Negative  Insight:  Negative  Psychomotor Activity:  Negative  Concentration:  Concentration: Negative  Recall:  Negative  Fund of Knowledge:  Negative  Language:  Negative  Akathisia:  Negative  Handed:  Right  AIMS (if indicated):     Assets:  Social Support  ADL's:  Impaired  Cognition:  Impaired,  Mild and Moderate  Sleep:  Number of Hours: 7.75     Treatment Plan Summary: Daily contact with patient to assess and evaluate symptoms and progress in treatment, Medication management and Plan Patient will continue current medication.  Needs to continue one-to-one.  Family reportedly working on placement issues  Mordecai Rasmussen, MD 05/04/2020, 3:55 PM

## 2020-05-04 NOTE — Progress Notes (Signed)
Patient was not able to sleep after receiving Haldol 2mg  PO. Became more and more restless and agitated, pacing. Zyprexa 10 mg IM given  in right gluteus and patient tolerated well. Staff continue to monitor.

## 2020-05-04 NOTE — Progress Notes (Signed)
Patient became more and more restless and agitated. Received Haldol 2 mg by mouth. Safety precautions reinforced on 1:1.

## 2020-05-05 NOTE — Progress Notes (Signed)
[facility] MD Progress Note  05/05/2020 4:06 PM Lance Ray  MRN:  [id number] Subjective: No new complaints.  Remains withdrawn demented.  Not having agitated behavior the last day or so.  No sign of acute seizures. Principal Problem: Dementia (HCC) Diagnosis: Principal Problem:   Dementia (HCC) Active Problems:   Recurrent seizures (HCC)   Seizure disorder (HCC)   Psychosis (HCC)   Failure to thrive in adult   Stuttering   Depression   Anemia due to vitamin B12 deficiency  Total Time spent with patient: 20 minutes  Past Psychiatric History: Past history of alcohol abuse and dementia from epilepsy  Past Medical History:  Past Medical History:  Diagnosis Date  . Seizures (HCC)     Past Surgical History:  Procedure Laterality Date  . SKIN GRAFT     Family History:  Family History  Problem Relation Age of Onset  . Seizures Mother   . Cerebral aneurysm Father    Family Psychiatric  History: See previous Social History:  Social History   Substance and Sexual Activity  Alcohol Use Yes  . Alcohol/week: 2.0 standard drinks  . Types: 2 Shots of liquor per week   Comment: "rarely"      Social History   Substance and Sexual Activity  Drug Use No    Social History   Socioeconomic History  . Marital status: Single    Spouse name: Not on file  . Number of children: Not on file  . Years of education: Not on file  . Highest education level: Not on file  Occupational History  . Not on file  Tobacco Use  . Smoking status: Current Some Day Smoker  . Smokeless tobacco: Current User  Vaping Use  . Vaping Use: Never used  Substance and Sexual Activity  . Alcohol use: Yes    Alcohol/week: 2.0 standard drinks    Types: 2 Shots of liquor per week    Comment: "rarely"   . Drug use: No  . Sexual activity: Yes  Other Topics Concern  . Not on file  Social History Narrative  . Not on file   Social Determinants of Health   Financial Resource Strain:   . Difficulty of  Paying Living Expenses:   Food Insecurity:   . Worried About Programme researcher, broadcasting/film/video in the Last Year:   . Barista in the Last Year:   Transportation Needs:   . Freight forwarder (Medical):   [name] Lack of Transportation (Non-Medical):   Physical Activity:   . Days of Exercise per Week:   . Minutes of Exercise per Session:   Stress:   . Feeling of Stress :   Social Connections:   . Frequency of Communication with Friends and Family:   . Frequency of Social Gatherings with Friends and Family:   . Attends Religious Services:   . Active Member of Clubs or Organizations:   . Attends Banker Meetings:   [name] Marital Status:    Additional Social History:                         Sleep: Fair  Appetite:  Fair  Current Medications: Current Facility-Administered Medications  Medication Dose Route Frequency Provider Last Rate Last Admin  . acetaminophen (TYLENOL) tablet 650 mg  650 mg Oral Q6H PRN Eldridge Marcott, [name], MD   650 mg at 04/20/20 1248  . alum & mag hydroxide-simeth (MAALOX/MYLANTA) 200-200-20 MG/5ML suspension 30  mL  30 mL Oral Q4H PRN Shelitha Magley T, MD      . clonazePAM (KLONOPIN) disintegrating tablet 0.5 mg  0.5 mg Oral Daily [name], MD   0.5 mg at 05/05/20 0848  . diphenhydrAMINE (BENADRYL) capsule 50 mg  50 mg Oral Once Gillermo Murdoch, NP       Or  . diphenhydrAMINE (BENADRYL) injection 50 mg  50 mg Intramuscular Once Gillermo Murdoch, NP      . haloperidol (HALDOL) tablet 2 mg  2 mg Oral Q6H PRN Jalisa Sacco T, MD   2 mg at 05/04/20 0002   Or  . haloperidol lactate (HALDOL) injection 1 mg  1 mg Intravenous Q6H PRN Jawara Latorre, [name], MD   1 mg at 04/17/20 0110  . haloperidol (HALDOL) tablet 3 mg  3 mg Oral QHS [name], MD   3 mg at 05/04/20 2200  . haloperidol lactate (HALDOL) injection 5 mg  5 mg Intramuscular Once Gillermo Murdoch, NP      . lamoTRIgine (LAMICTAL) tablet 150 mg  150 mg Oral q AM [name], MD   150 mg at 05/05/20 6213   And  . lamoTRIgine (LAMICTAL) tablet 175 mg  175 mg Oral QPM [name], MD   175 mg at 05/04/20 1705  . [START ON 05/08/2020] lamoTRIgine (LAMICTAL) tablet 175 mg  175 mg Oral BID [name], MD      . levETIRAcetam (KEPPRA) tablet 1,000 mg  1,000 mg Oral Earline Mayotte, [name], MD   1,000 mg at 05/05/20 0727   And  . levETIRAcetam (KEPPRA) tablet 1,500 mg  1,500 mg Oral QPM [name], MD   1,500 mg at 05/04/20 2118  . [START ON 05/08/2020] levETIRAcetam (KEPPRA) tablet 1,000 mg  1,000 mg Oral BID [name], MD      . LORazepam (ATIVAN) injection 2 mg  2 mg Intramuscular Once PRN [name], MD      . magnesium hydroxide (MILK OF MAGNESIA) suspension 30 mL  30 mL Oral Daily PRN Traquan Duarte T, MD      . megestrol (MEGACE) tablet 160 mg  160 mg Oral Daily [name], MD   160 mg at 05/05/20 0848  . melatonin tablet 2.5 mg  2.5 mg Oral QHS [name], MD   2.5 mg at 05/04/20 2119  . OLANZapine (ZYPREXA) injection 10 mg  10 mg Intramuscular Q6H PRN Courtany Mcmurphy, [name], MD   10 mg at 05/04/20 0422  . OLANZapine zydis (ZYPREXA) disintegrating tablet 25 mg  25 mg Oral QHS [name], MD   25 mg at 05/04/20 2119  . OLANZapine zydis (ZYPREXA) disintegrating tablet 5 mg  5 mg Oral Daily [name], MD   5 mg at 05/05/20 0848  . ondansetron (ZOFRAN) tablet 4 mg  4 mg Oral Q6H PRN Tahesha Skeet T, MD       Or  . ondansetron (ZOFRAN) injection 4 mg  4 mg Intravenous Q6H PRN Adylynn Hertenstein T, MD      . pyridOXINE (VITAMIN B-6) tablet 50 mg  50 mg Oral Daily [name], MD   50 mg at 05/05/20 0848  . thiamine tablet 100 mg  100 mg Oral Daily Graclyn Lawther, [name], MD   100 mg at 05/05/20 0848  . vitamin B-12 (CYANOCOBALAMIN) tablet 1,000 mcg  1,000 mcg Oral Daily [name], MD   1,000 mcg at 05/05/20 0848    Lab Results: No results found for this  or any previous visit (from the past 48  hour(s)).  Blood Alcohol level:  Lab Results  Component Value Date   ETH <10 11/03/2019   ETH <5 12/05/2016    Metabolic Disorder Labs: No results found for: HGBA1C, MPG No results found for: PROLACTIN No results found for: CHOL, TRIG, HDL, CHOLHDL, VLDL, LDLCALC  Physical Findings: AIMS:  , ,  ,  ,    CIWA:    COWS:     Musculoskeletal: Strength & Muscle Tone: within normal limits Gait & Station: unsteady Patient leans: N/A  Psychiatric Specialty Exam: Physical Exam Vitals and nursing note reviewed.  Constitutional:      Appearance: He is well-developed.  HENT:     Head: Normocephalic and atraumatic.  Eyes:     Conjunctiva/sclera: Conjunctivae normal.     Pupils: Pupils are equal, round, and reactive to light.  Cardiovascular:     Heart sounds: Normal heart sounds.  Pulmonary:     Effort: Pulmonary effort is normal.  Abdominal:     Palpations: Abdomen is soft.  Musculoskeletal:        General: Normal range of motion.     Cervical back: Normal range of motion.  Skin:    General: Skin is warm and dry.  Neurological:     General: No focal deficit present.     Mental Status: He is alert.  Psychiatric:        Attention and Perception: He is inattentive.        Mood and Affect: Affect is blunt.        Speech: He is noncommunicative.     Review of Systems  Unable to perform ROS: Psychiatric disorder    Blood pressure (!) 125/109, pulse 100, temperature 98.6 F (37 C), temperature source Oral, resp. rate 18, height 5\' 9"  (1.753 m), weight 62.6 kg, SpO2 100 %.Body mass index is 20.38 kg/m.  General Appearance: Disheveled  Eye Contact:  Minimal  Speech:  Slow  Volume:  Decreased  Mood:  Euthymic  Affect:  Constricted  Thought Process:  Disorganized  Orientation:  Negative  Thought Content:  Illogical  Suicidal Thoughts:  No  Homicidal Thoughts:  No  Memory:  Immediate;   Fair Recent;   Poor Remote;   Poor  Judgement:  Poor  Insight:  Lacking   Psychomotor Activity:  Decreased  Concentration:  Concentration: Poor  Recall:  Poor  Fund of Knowledge:  Poor  Language:  Fair  Akathisia:  No  Handed:  Right  AIMS (if indicated):     Assets:  Social Support  ADL's:  Impaired  Cognition:  Impaired,  Severe  Sleep:  Number of Hours: 2.15     Treatment Plan Summary: Plan No indication to change medicine.  We are only waiting on some kind of discharge plan with placement  , MD 05/05/2020, 4:06 PM

## 2020-05-05 NOTE — Progress Notes (Signed)
Patient on 1:1 sitter at bedside, no distress noted he appears restless, thoughts are disorganized and incoherent, his speech is less rapid but  slurred not interacting appropriately with peers and staff. Patient is sometimes impulsive and would;d attempt to run out of his room, his gait remains unsteady  he was frequently redirected for safety, 15 minutes safety checks maintained will continue to monitor.

## 2020-05-05 NOTE — Progress Notes (Signed)
Pt is alert and oriented to person only. Pt has been with his 1:1 Pharmacist, community for safety. Pt is calm, cooperative, naps often, refused breakfast but ate a last morning snack and was up for lunch and dinner. Pt spends time watching TV in the dayroom. Pt requires assistance with walking uses a gait belt and assist with setting up his meals, and assist with ADLs. No distress noted, none reported, pt voices no complaints. Will continue to monitor pt per Q15 minute face checks and monitor for safety and progress.

## 2020-05-05 NOTE — Progress Notes (Signed)
Recreation Therapy Notes    Date: 05/05/2020  Time: 9:30 am   Location: [address]     Behavioral response: N/A   Intervention Topic: Strengths   Discussion/Intervention: Patient did not attend group.   Clinical Observations/Feedback:  Patient did not attend group.   Ceil Roderick LRT/CTRS        Jalynn Betzold 05/05/2020 12:39 PM

## 2020-05-06 NOTE — Progress Notes (Signed)
Pt is asleep. Sitter at bedside. Torrie Mayers RN

## 2020-05-06 NOTE — Plan of Care (Signed)
Pt denies depression, anxiety, SI, HI and and AVH. Pt was educated on care plan and verbalizes understanding. Pt was encouraged to attend groups. Torrie Mayers RN Problem: Education: Goal: Knowledge of [facility] Education information/materials will improve Outcome: Progressing Goal: Emotional status will improve Outcome: Progressing Goal: Mental status will improve Outcome: Progressing Goal: Verbalization of understanding the information provided will improve Outcome: Progressing   Problem: Health Behavior/Discharge Planning: Goal: Identification of resources available to assist in meeting health care needs will improve Outcome: Progressing Goal: Compliance with treatment plan for underlying cause of condition will improve Outcome: Progressing   Problem: Physical Regulation: Goal: Ability to maintain clinical measurements within normal limits will improve Outcome: Progressing   Problem: Safety: Goal: Periods of time without injury will increase Outcome: Progressing   Problem: Education: Goal: Verbalization of understanding the information provided will improve Outcome: Progressing   Problem: Coping: Goal: Ability to demonstrate appropriate behavior when angry will improve Outcome: Progressing Goal: Ability to identify and develop effective coping behavior will improve Outcome: Progressing Goal: Ability to interact with others will improve Outcome: Progressing

## 2020-05-06 NOTE — Progress Notes (Signed)
Neurology Progress Note   S://  Patient seen and examined in the behavioral health unit. Spoke with the attending psychiatrist who reports that he has been stable over the past few days but has had this priods  Where he gets more agitated, starts running around the halls and has been assaultive towards staff.   O:// Current vital signs: BP 111/82 (BP Location: Left Arm)   Pulse 93   Temp 97.8 F (36.6 C) (Oral)   Resp 16   Ht 5\' 9"  (1.753 m)   Wt 62.6 kg   SpO2 96%   BMI 20.38 kg/m  Vital signs in last 24 hours: Temp:  [97.8 F (36.6 C)] 97.8 F (36.6 C) (08/12 0934) Pulse Rate:  [93] 93 (08/12 0934) Resp:  [16] 16 (08/12 0934) BP: (111)/(82) 111/82 (08/12 0934) SpO2:  [96 %] 96 % (08/12 0934) Neurological exam Awake alert oriented x3 Speech is not dysarthric He seems to be not fully aware of his situation but is able to cooperate with the full exam. No evidence of aphasia Cranial nerves II to XII intact Motor exam: No drift-full strength 5/5 in all fours Sensory exam: Intact to light touch Coordination: No dysmetria.    Medications  Current Facility-Administered Medications:  .  acetaminophen (TYLENOL) tablet 650 mg, 650 mg, Oral, Q6H PRN, [name], [name], MD, 650 mg at 04/20/20 1248 .  alum & mag hydroxide-simeth (MAALOX/MYLANTA) 200-200-20 MG/5ML suspension 30 mL, 30 mL, Oral, Q4H PRN, [name], [name], MD .  clonazePAM (KLONOPIN) disintegrating tablet 0.5 mg, 0.5 mg, Oral, Daily, 04-03-2001, Jola Babinski, MD, 0.5 mg at 05/06/20 0935 .  diphenhydrAMINE (BENADRYL) capsule 50 mg, 50 mg, Oral, Once **OR** diphenhydrAMINE (BENADRYL) injection 50 mg, 50 mg, Intramuscular, Once, 07/06/20, NP .  haloperidol (HALDOL) tablet 2 mg, 2 mg, Oral, Q6H PRN, 2 mg at 05/06/20 0325 **OR** haloperidol lactate (HALDOL) injection 1 mg, 1 mg, Intravenous, Q6H PRN, [name], [name], MD, 1 mg at 04/17/20 0110 .  haloperidol (HALDOL) tablet 3 mg, 3 mg, Oral, QHS, 04/19/20, MD, 3  mg at 05/05/20 2220 .  haloperidol lactate (HALDOL) injection 5 mg, 5 mg, Intramuscular, Once, 2221, NP .  Gillermo Murdoch lamoTRIgine (LAMICTAL) tablet 150 mg, 150 mg, Oral, q AM, 150 mg at 05/06/20 0937 **AND** lamoTRIgine (LAMICTAL) tablet 175 mg, 175 mg, Oral, QPM, 07/06/20, MD, 175 mg at 05/05/20 1701 .  [START ON 05/08/2020] lamoTRIgine (LAMICTAL) tablet 175 mg, 175 mg, Oral, BID, 05/10/2020, MD .  [name] ON 05/08/2020] levETIRAcetam (KEPPRA) tablet 1,000 mg, 1,000 mg, Oral, BID, 05/10/2020, MD .  LORazepam (ATIVAN) injection 2 mg, 2 mg, Intramuscular, Once PRN, [name], Jola Babinski, MD .  magnesium hydroxide (MILK OF MAGNESIA) suspension 30 mL, 30 mL, Oral, Daily PRN, [name], [name], MD .  megestrol (MEGACE) tablet 160 mg, 160 mg, Oral, Daily, Marlane Mingle, MD, 160 mg at 05/06/20 0939 .  melatonin tablet 2.5 mg, 2.5 mg, Oral, QHS, 07/06/20, MD, 2.5 mg at 05/05/20 2220 .  OLANZapine (ZYPREXA) injection 10 mg, 10 mg, Intramuscular, Q6H PRN, [name], [name], MD, 10 mg at 05/04/20 0422 .  OLANZapine zydis (ZYPREXA) disintegrating tablet 25 mg, 25 mg, Oral, QHS, 07/04/20, MD, 25 mg at 05/05/20 2221 .  OLANZapine zydis (ZYPREXA) disintegrating tablet 5 mg, 5 mg, Oral, Daily, 2222, Jola Babinski, MD, 5 mg at 05/06/20 0936 .  ondansetron (ZOFRAN) tablet 4 mg, 4 mg, Oral, Q6H PRN **OR** ondansetron (ZOFRAN) injection 4  mg, 4 mg, Intravenous, Q6H PRN, [name], [name], MD .  pyridOXINE (VITAMIN B-6) tablet 50 mg, 50 mg, Oral, Daily, [name], MD, 50 mg at 05/06/20 0938 .  thiamine tablet 100 mg, 100 mg, Oral, Daily, [name], [name], MD, 100 mg at 05/06/20 0935 .  vitamin B-12 (CYANOCOBALAMIN) tablet 1,000 mcg, 1,000 mcg, Oral, Daily, [name], MD, 1,000 mcg at 05/06/20 0936 Labs CBC    Component Value Date/Time   WBC 4.9 04/20/2020 0730   RBC 4.81 04/20/2020 0730   HGB 14.2 04/20/2020 0730   HCT 43.6 04/20/2020 0730    PLT 285 04/20/2020 0730   MCV 90.6 04/20/2020 0730   MCH 29.5 04/20/2020 0730   MCHC 32.6 04/20/2020 0730   RDW 13.9 04/20/2020 0730   LYMPHSABS 1.0 03/31/2020 0917   MONOABS 0.5 03/31/2020 0917   EOSABS 0.1 03/31/2020 0917   BASOSABS 0.0 03/31/2020 0917    CMP     Component Value Date/Time   NA 141 04/20/2020 0730   K 4.3 04/20/2020 0730   CL 106 04/20/2020 0730   CO2 28 04/20/2020 0730   GLUCOSE 88 04/20/2020 0730   BUN 19 04/20/2020 0730   CREATININE 1.02 04/20/2020 0730   CALCIUM 9.0 04/20/2020 0730   PROT 6.8 04/04/2020 0808   ALBUMIN 4.3 04/04/2020 0808   AST 17 04/04/2020 0808   ALT 13 04/04/2020 0808   ALKPHOS 78 04/04/2020 0808   BILITOT 0.7 04/04/2020 0808   GFRNONAA >60 04/20/2020 0730   GFRAA >60 04/20/2020 0730    Imaging I have reviewed images in epic and the results pertinent to this consultation are: No new imaging for me to review.  Assessment: 49 year old man past history significant for dementia, psychosis, depression, extensive alcohol abuse in the past, epilepsy on lamotrigine and Keppra, admitted to behavioral health for management of confusion and agitation along with memory disturbance, noted to be agitated and aggressive towards staff.  This had started in the setting of increasing Keppra to 1500 mg twice daily from 1000 mg twice daily.  My partner who saw the patient was under the impression that the increasing dose of Keppra might have contributed to the agitation and tapered it down back to 1000 mg twice daily while increasing his Lamictal to 175 mg twice daily and also started him on B6, for heparin-induced agitation. He has not been agitated ever since the last neurological evaluation on 05/02/2020. He does continue to have executive function deficits but no other focal neurological deficits at this time   Impression: Seizure disorder Possible side effects of Keppra leading to agitation.  Recommendations: -I would continue with up titration of  lamotrigine as had been recommended by Dr. Derry Lory in his note from 05/02/2020-lamotrigine 175 mg every morning and 150 mg every afternoon until August 13 and then increase to 175 mg twice daily from then on. I would also down titrate Keppra as advised by him-1000 mg every morning and 1500 mg every afternoon till August 13 and then starting the next day at 1000 mg twice daily. I do not see any side effects of continuing vitamin B6 50 mg daily as there is some evidence that it helps Reduce agitation. Ativan 2 mg as needed for any seizure lasting 5 minutes Seizure precautions Continue melatonin as advised Psychiatric management per primary team as you are. Inpatient neurology will be available as needed-please not hesitate to call with questions. Plan discussed with psychiatry attending in the unit, Dr. Mordecai Rasmussen  -- Beatrix Fetters  Wilford Corner, MD [facility] Pager: [phone] If 7pm to 7am, please call on call as listed on [facility]ION.

## 2020-05-06 NOTE — Progress Notes (Signed)
Recreation Therapy Notes  Date: 05/06/2020  Time: 9:30 am   Location: Craft room  Behavioral response: N/A   Intervention Topic: Animal Assisted therapy    Discussion/Intervention: Patient did not attend group.   Clinical Observations/Feedback:  Patient did not attend group.   Kiora Hallberg LRT/CTRS         Baily Serpe 05/06/2020 11:53 AM

## 2020-05-06 NOTE — Progress Notes (Signed)
[facility] MD Progress Note  05/06/2020 11:14 AM Lance Ray  MRN:  [id number] Subjective: Follow-up for this patient with dementia.  Patient continues with his usual mental state.  Stays in bed most of the time.  Often awake but even when awake able to answer only the occasional question with a monosyllable.  Today he was able to answer a few basic questions but remains confused and does not understand his current situation.  Unable to make any kind of articulate plans for himself.  He has not been aggressive or violent in the last day and has remained cooperative with medicine.  I got a chance to speak with the neurologist consultant today greatly appreciate neurology's continued involvement in this case. Principal Problem: Dementia (HCC) Diagnosis: Principal Problem:   Dementia (HCC) Active Problems:   Recurrent seizures (HCC)   Seizure disorder (HCC)   Psychosis (HCC)   Failure to thrive in adult   Stuttering   Depression   Anemia due to vitamin B12 deficiency  Total Time spent with patient: 30 minutes  Past Psychiatric History: Longstanding problems with alcohol abuse and epilepsy with poor compliance with treatment  Past Medical History:  Past Medical History:  Diagnosis Date  . Seizures (HCC)     Past Surgical History:  Procedure Laterality Date  . SKIN GRAFT     Family History:  Family History  Problem Relation Age of Onset  . Seizures Mother   . Cerebral aneurysm Father    Family Psychiatric  History: See previous Social History:  Social History   Substance and Sexual Activity  Alcohol Use Yes  . Alcohol/week: 2.0 standard drinks  . Types: 2 Shots of liquor per week   Comment: "rarely"      Social History   Substance and Sexual Activity  Drug Use No    Social History   Socioeconomic History  . Marital status: Single    Spouse name: Not on file  . Number of children: Not on file  . Years of education: Not on file  . Highest education level: Not on file   Occupational History  . Not on file  Tobacco Use  . Smoking status: Current Some Day Smoker  . Smokeless tobacco: Current User  Vaping Use  . Vaping Use: Never used  Substance and Sexual Activity  . Alcohol use: Yes    Alcohol/week: 2.0 standard drinks    Types: 2 Shots of liquor per week    Comment: "rarely"   . Drug use: No  . Sexual activity: Yes  Other Topics Concern  . Not on file  Social History Narrative  . Not on file   Social Determinants of Health   Financial Resource Strain:   . Difficulty of Paying Living Expenses:   Food Insecurity:   . Worried About Programme researcher, broadcasting/film/video in the Last Year:   . Barista in the Last Year:   Transportation Needs:   . Freight forwarder (Medical):   [name] Lack of Transportation (Non-Medical):   Physical Activity:   . Days of Exercise per Week:   . Minutes of Exercise per Session:   Stress:   . Feeling of Stress :   Social Connections:   . Frequency of Communication with Friends and Family:   . Frequency of Social Gatherings with Friends and Family:   . Attends Religious Services:   . Active Member of Clubs or Organizations:   . Attends Banker Meetings:   .  Marital Status:    Additional Social History:                         Sleep: Fair  Appetite:  Fair  Current Medications: Current Facility-Administered Medications  Medication Dose Route Frequency Provider Last Rate Last Admin  . acetaminophen (TYLENOL) tablet 650 mg  650 mg Oral Q6H PRN Mycah Formica, [name], MD   650 mg at 04/20/20 1248  . alum & mag hydroxide-simeth (MAALOX/MYLANTA) 200-200-20 MG/5ML suspension 30 mL  30 mL Oral Q4H PRN Onyinyechi Huante T, MD      . clonazePAM (KLONOPIN) disintegrating tablet 0.5 mg  0.5 mg Oral Daily [name], MD   0.5 mg at 05/06/20 0935  . diphenhydrAMINE (BENADRYL) capsule 50 mg  50 mg Oral Once Gillermo Murdoch, NP       Or  . diphenhydrAMINE (BENADRYL) injection 50 mg  50 mg Intramuscular  Once Gillermo Murdoch, NP      . haloperidol (HALDOL) tablet 2 mg  2 mg Oral Q6H PRN Lisvet Rasheed, [name], MD   2 mg at 05/06/20 0325   Or  . haloperidol lactate (HALDOL) injection 1 mg  1 mg Intravenous Q6H PRN Harding Thomure, [name], MD   1 mg at 04/17/20 0110  . haloperidol (HALDOL) tablet 3 mg  3 mg Oral QHS [name], MD   3 mg at 05/05/20 2220  . haloperidol lactate (HALDOL) injection 5 mg  5 mg Intramuscular Once Gillermo Murdoch, NP      . lamoTRIgine (LAMICTAL) tablet 175 mg  175 mg Oral QPM [name], MD   175 mg at 05/05/20 1701  . [START ON 05/08/2020] lamoTRIgine (LAMICTAL) tablet 175 mg  175 mg Oral BID [name], MD      . Melene Muller ON 05/08/2020] levETIRAcetam (KEPPRA) tablet 1,000 mg  1,000 mg Oral BID [name], MD      . LORazepam (ATIVAN) injection 2 mg  2 mg Intramuscular Once PRN [name], MD      . magnesium hydroxide (MILK OF MAGNESIA) suspension 30 mL  30 mL Oral Daily PRN Kytzia Gienger T, MD      . megestrol (MEGACE) tablet 160 mg  160 mg Oral Daily [name], MD   160 mg at 05/06/20 0939  . melatonin tablet 2.5 mg  2.5 mg Oral QHS [name], MD   2.5 mg at 05/05/20 2220  . OLANZapine (ZYPREXA) injection 10 mg  10 mg Intramuscular Q6H PRN Sura Canul, [name], MD   10 mg at 05/04/20 0422  . OLANZapine zydis (ZYPREXA) disintegrating tablet 25 mg  25 mg Oral QHS [name], MD   25 mg at 05/05/20 2221  . OLANZapine zydis (ZYPREXA) disintegrating tablet 5 mg  5 mg Oral Daily [name], MD   5 mg at 05/06/20 0936  . ondansetron (ZOFRAN) tablet 4 mg  4 mg Oral Q6H PRN Sylver Vantassell T, MD       Or  . ondansetron (ZOFRAN) injection 4 mg  4 mg Intravenous Q6H PRN Grettel Rames T, MD      . pyridOXINE (VITAMIN B-6) tablet 50 mg  50 mg Oral Daily [name], MD   50 mg at 05/06/20 0938  . thiamine tablet 100 mg  100 mg Oral Daily Lavina Resor, [name], MD   100 mg at 05/06/20 0935  . vitamin B-12 (CYANOCOBALAMIN)  tablet 1,000 mcg  1,000 mcg Oral Daily [name], MD  1,000 mcg at 05/06/20 3570    Lab Results: No results found for this or any previous visit (from the past 48 hour(s)).  Blood Alcohol level:  Lab Results  Component Value Date   ETH <10 11/03/2019   ETH <5 12/05/2016    Metabolic Disorder Labs: No results found for: HGBA1C, MPG No results found for: PROLACTIN No results found for: CHOL, TRIG, HDL, CHOLHDL, VLDL, LDLCALC  Physical Findings: AIMS:  , ,  ,  ,    CIWA:    COWS:     Musculoskeletal: Strength & Muscle Tone: decreased Gait & Station: unsteady Patient leans: N/A  Psychiatric Specialty Exam: Physical Exam Vitals and nursing note reviewed.  Constitutional:      Appearance: He is well-developed.  HENT:     Head: Normocephalic and atraumatic.  Eyes:     Conjunctiva/sclera: Conjunctivae normal.     Pupils: Pupils are equal, round, and reactive to light.  Cardiovascular:     Heart sounds: Normal heart sounds.  Pulmonary:     Effort: Pulmonary effort is normal.  Abdominal:     Palpations: Abdomen is soft.  Musculoskeletal:        General: Normal range of motion.     Cervical back: Normal range of motion.  Skin:    General: Skin is warm and dry.  Neurological:     General: No focal deficit present.     Mental Status: He is alert. He is disoriented.  Psychiatric:        Attention and Perception: Perception normal. He is inattentive.        Mood and Affect: Affect is blunt.        Speech: He is noncommunicative.     Review of Systems  Unable to perform ROS: Patient unresponsive    Blood pressure 111/82, pulse 93, temperature 97.8 F (36.6 C), temperature source Oral, resp. rate 16, height 5\' 9"  (1.753 m), weight 62.6 kg, SpO2 96 %.Body mass index is 20.38 kg/m.  General Appearance: Disheveled  Eye Contact:  Minimal  Speech:  Garbled and Slow  Volume:  Decreased  Mood:  Euthymic  Affect:  Constricted  Thought Process:  Disorganized   Orientation:  Negative  Thought Content:  Illogical, Rumination and Tangential  Suicidal Thoughts:  No  Homicidal Thoughts:  No  Memory:  Immediate;   Fair Recent;   Poor Remote;   Poor  Judgement:  Negative  Insight:  Negative  Psychomotor Activity:  Decreased  Concentration:  Concentration: Poor  Recall:  Poor  Fund of Knowledge:  Poor  Language:  Poor  Akathisia:  Negative  Handed:  Right  AIMS (if indicated):     Assets:  Financial Resources/Insurance Social Support  ADL's:  Impaired  Cognition:  Impaired,  Moderate and Severe  Sleep:  Number of Hours: 8.25     Treatment Plan Summary: Plan I greatly appreciate the assistance from neurology and continuing his antiepileptic medicine and providing any other insight or recommendations.  No change to current treatment.  Patient remains a very high fall risk unsteady on his feet and unpredictable requiring one-to-one observation.  Treatment team reviews the situation daily that the family is reportedly working on finding guardianship and some kind of living situation  , MD 05/06/2020, 11:14 AM

## 2020-05-06 NOTE — Progress Notes (Signed)
D- Patient alert and oriented. Affect/mood is apathetic and sad. Pt denies SI, HI, AVH, and pain. Pt has been withdrawn today in his room. He did go outside for a few minutes.    A- Scheduled medications administered to patient, per MD orders. Support and encouragement provided.  Routine safety checks conducted every 15 minutes.  Patient informed to notify staff with problems or concerns.  R- No adverse drug reactions noted. Patient contracts for safety at this time. Patient compliant with medications and treatment plan. Patient receptive, calm, and cooperative. Patient interacts well with others on the unit.  Patient remains safe at this time.  Torrie Mayers RN

## 2020-05-06 NOTE — Progress Notes (Signed)
Patient alert and oriented x 4, affect is blunted, he is on 1:1 sitter at bedside, no distress noted he appears restless, thoughts are disorganized and incoherent at times. his speech is less rapid but slurred, he is interacting appropriately with peers and staff. Patient is sometimes impulsive and attempts to run out of his room, his gait remains unsteady  he is frequently redirected for safety, 15 minutes safety checks maintained will continue to monitor closely

## 2020-05-07 ENCOUNTER — Inpatient Hospital Stay
Admission: AD | Admit: 2020-05-07 | Discharge: 2020-08-12 | DRG: 101 | Disposition: A | Discharge status: Home / Self Care | Payer: Medicaid Other | Source: Ambulatory Visit | Attending: Internal Medicine | Admitting: Internal Medicine

## 2020-05-07 DIAGNOSIS — Z20822 Contact with and (suspected) exposure to covid-19: Secondary | ICD-10-CM | POA: Diagnosis present

## 2020-05-07 DIAGNOSIS — R451 Restlessness and agitation: Secondary | ICD-10-CM | POA: Diagnosis present

## 2020-05-07 DIAGNOSIS — I959 Hypotension, unspecified: Secondary | ICD-10-CM

## 2020-05-07 DIAGNOSIS — J9811 Atelectasis: Secondary | ICD-10-CM | POA: Diagnosis present

## 2020-05-07 DIAGNOSIS — Z79899 Other long term (current) drug therapy: Secondary | ICD-10-CM | POA: Diagnosis not present

## 2020-05-07 DIAGNOSIS — G40919 Epilepsy, unspecified, intractable, without status epilepticus: Secondary | ICD-10-CM | POA: Diagnosis present

## 2020-05-07 DIAGNOSIS — G3184 Mild cognitive impairment, so stated: Secondary | ICD-10-CM | POA: Diagnosis not present

## 2020-05-07 DIAGNOSIS — D649 Anemia, unspecified: Secondary | ICD-10-CM | POA: Diagnosis present

## 2020-05-07 DIAGNOSIS — F0390 Unspecified dementia without behavioral disturbance: Secondary | ICD-10-CM | POA: Diagnosis present

## 2020-05-07 DIAGNOSIS — E559 Vitamin D deficiency, unspecified: Secondary | ICD-10-CM | POA: Diagnosis present

## 2020-05-07 DIAGNOSIS — F1010 Alcohol abuse, uncomplicated: Secondary | ICD-10-CM | POA: Diagnosis present

## 2020-05-07 DIAGNOSIS — E538 Deficiency of other specified B group vitamins: Secondary | ICD-10-CM

## 2020-05-07 DIAGNOSIS — R4189 Other symptoms and signs involving cognitive functions and awareness: Secondary | ICD-10-CM

## 2020-05-07 DIAGNOSIS — R569 Unspecified convulsions: Secondary | ICD-10-CM

## 2020-05-07 DIAGNOSIS — G9340 Encephalopathy, unspecified: Secondary | ICD-10-CM | POA: Diagnosis present

## 2020-05-07 DIAGNOSIS — Z9114 Patient's other noncompliance with medication regimen: Secondary | ICD-10-CM

## 2020-05-07 DIAGNOSIS — D72829 Elevated white blood cell count, unspecified: Secondary | ICD-10-CM

## 2020-05-07 DIAGNOSIS — R059 Cough, unspecified: Secondary | ICD-10-CM

## 2020-05-07 DIAGNOSIS — F29 Unspecified psychosis not due to a substance or known physiological condition: Secondary | ICD-10-CM

## 2020-05-07 DIAGNOSIS — Z9119 Patient's noncompliance with other medical treatment and regimen: Secondary | ICD-10-CM | POA: Diagnosis not present

## 2020-05-07 DIAGNOSIS — Z560 Unemployment, unspecified: Secondary | ICD-10-CM

## 2020-05-07 MED ORDER — SODIUM CHLORIDE 0.9 % IV SOLN: 75.0000 mL/h | INTRAVENOUS | Status: DC

## 2020-05-07 MED ORDER — ENOXAPARIN SODIUM 40 MG/0.4ML ~~LOC~~ SOLN: 40.0000 mg | SUBCUTANEOUS | Status: DC

## 2020-05-07 MED ORDER — SODIUM CHLORIDE 0.9 % IV SOLN
75.0000 mL/h | INTRAVENOUS | Status: DC
  Administered 2020-05-08 – 2020-05-10 (×4): 75 mL/h via INTRAVENOUS

## 2020-05-07 MED ORDER — ENOXAPARIN SODIUM 40 MG/0.4ML ~~LOC~~ SOLN
40.0000 mg | SUBCUTANEOUS | Status: DC
  Administered 2020-05-08 – 2020-08-11 (×84): 40 mg via SUBCUTANEOUS
  Filled 2020-05-07 (×86): qty 0.4

## 2020-05-07 NOTE — Progress Notes (Signed)
Patient remains on 1:1 with sitter at bedside for safety and falls.  Patient contracts for safety at this time, he denies SI/AVH/HI. Patient compliant with medications and treatment plan. Patient has been calm, and cooperative. Patient in the bed resting quietly at this time.

## 2020-05-07 NOTE — Progress Notes (Signed)
[facility] MD Progress Note  05/07/2020 3:57 PM Lance Ray  MRN:  [id number] Subjective: Patient has no complaints Principal Problem: Dementia (HCC) Diagnosis: Principal Problem:   Dementia (HCC) Active Problems:   Recurrent seizures (HCC)   Seizure disorder (HCC)   Psychosis (HCC)   Failure to thrive in adult   Stuttering   Depression   Anemia due to vitamin B12 deficiency  Total Time spent with patient: 20 minutes  Past Psychiatric History: Chronic seizures alcohol abuse and dementia  Past Medical History:  Past Medical History:  Diagnosis Date  . Seizures (HCC)     Past Surgical History:  Procedure Laterality Date  . SKIN GRAFT     Family History:  Family History  Problem Relation Age of Onset  . Seizures Mother   . Cerebral aneurysm Father    Family Psychiatric  History: See previous Social History:  Social History   Substance and Sexual Activity  Alcohol Use Yes  . Alcohol/week: 2.0 standard drinks  . Types: 2 Shots of liquor per week   Comment: "rarely"      Social History   Substance and Sexual Activity  Drug Use No    Social History   Socioeconomic History  . Marital status: Single    Spouse name: Not on file  . Number of children: Not on file  . Years of education: Not on file  . Highest education level: Not on file  Occupational History  . Not on file  Tobacco Use  . Smoking status: Current Some Day Smoker  . Smokeless tobacco: Current User  Vaping Use  . Vaping Use: Never used  Substance and Sexual Activity  . Alcohol use: Yes    Alcohol/week: 2.0 standard drinks    Types: 2 Shots of liquor per week    Comment: "rarely"   . Drug use: No  . Sexual activity: Yes  Other Topics Concern  . Not on file  Social History Narrative  . Not on file   Social Determinants of Health   Financial Resource Strain:   . Difficulty of Paying Living Expenses:   Food Insecurity:   . Worried About Programme researcher, broadcasting/film/video in the Last Year:   . Garment/textile technologist in the Last Year:   Transportation Needs:   . Freight forwarder (Medical):   [name] Lack of Transportation (Non-Medical):   Physical Activity:   . Days of Exercise per Week:   . Minutes of Exercise per Session:   Stress:   . Feeling of Stress :   Social Connections:   . Frequency of Communication with Friends and Family:   . Frequency of Social Gatherings with Friends and Family:   . Attends Religious Services:   . Active Member of Clubs or Organizations:   . Attends Banker Meetings:   [name] Marital Status:    Additional Social History:                         Sleep: Fair  Appetite:  Fair  Current Medications: Current Facility-Administered Medications  Medication Dose Route Frequency Provider Last Rate Last Admin  . acetaminophen (TYLENOL) tablet 650 mg  650 mg Oral Q6H PRN Mysti Haley, [name], MD   650 mg at 04/20/20 1248  . alum & mag hydroxide-simeth (MAALOX/MYLANTA) 200-200-20 MG/5ML suspension 30 mL  30 mL Oral Q4H PRN Fauna Neuner T, MD      . clonazePAM (KLONOPIN) disintegrating tablet 0.5  mg  0.5 mg Oral Daily [name], MD   0.5 mg at 05/07/20 0818  . diphenhydrAMINE (BENADRYL) capsule 50 mg  50 mg Oral Once Gillermo Murdoch, NP       Or  . diphenhydrAMINE (BENADRYL) injection 50 mg  50 mg Intramuscular Once Gillermo Murdoch, NP      . haloperidol (HALDOL) tablet 2 mg  2 mg Oral Q6H PRN Doyle Kunath, [name], MD   2 mg at 05/06/20 0325   Or  . haloperidol lactate (HALDOL) injection 1 mg  1 mg Intravenous Q6H PRN Ataya Murdy, [name], MD   1 mg at 04/17/20 0110  . haloperidol (HALDOL) tablet 3 mg  3 mg Oral QHS [name], MD   3 mg at 05/06/20 2115  . haloperidol lactate (HALDOL) injection 5 mg  5 mg Intramuscular Once Gillermo Murdoch, NP      . Melene Muller ON 05/08/2020] lamoTRIgine (LAMICTAL) tablet 175 mg  175 mg Oral BID [name], MD      . Melene Muller ON 05/08/2020] levETIRAcetam (KEPPRA) tablet 1,000 mg  1,000 mg Oral BID  [name], MD      . LORazepam (ATIVAN) injection 2 mg  2 mg Intramuscular Once PRN [name], MD      . magnesium hydroxide (MILK OF MAGNESIA) suspension 30 mL  30 mL Oral Daily PRN Vanessia Bokhari T, MD      . megestrol (MEGACE) tablet 160 mg  160 mg Oral Daily [name], MD   160 mg at 05/07/20 0818  . melatonin tablet 2.5 mg  2.5 mg Oral QHS [name], MD   2.5 mg at 05/06/20 2115  . OLANZapine (ZYPREXA) injection 10 mg  10 mg Intramuscular Q6H PRN Nicklous Aburto, [name], MD   10 mg at 05/04/20 0422  . OLANZapine zydis (ZYPREXA) disintegrating tablet 25 mg  25 mg Oral QHS [name], MD   25 mg at 05/06/20 2114  . OLANZapine zydis (ZYPREXA) disintegrating tablet 5 mg  5 mg Oral Daily [name], MD   5 mg at 05/07/20 6948  . ondansetron (ZOFRAN) tablet 4 mg  4 mg Oral Q6H PRN Ferdinand Revoir T, MD       Or  . ondansetron (ZOFRAN) injection 4 mg  4 mg Intravenous Q6H PRN Jeniah Kishi T, MD      . pyridOXINE (VITAMIN B-6) tablet 50 mg  50 mg Oral Daily [name], MD   50 mg at 05/07/20 0817  . thiamine tablet 100 mg  100 mg Oral Daily Geremy Rister, [name], MD   100 mg at 05/07/20 0817  . vitamin B-12 (CYANOCOBALAMIN) tablet 1,000 mcg  1,000 mcg Oral Daily [name], MD   1,000 mcg at 05/07/20 5462    Lab Results: No results found for this or any previous visit (from the past 48 hour(s)).  Blood Alcohol level:  Lab Results  Component Value Date   ETH <10 11/03/2019   ETH <5 12/05/2016    Metabolic Disorder Labs: No results found for: HGBA1C, MPG No results found for: PROLACTIN No results found for: CHOL, TRIG, HDL, CHOLHDL, VLDL, LDLCALC  Physical Findings: AIMS:  , ,  ,  ,    CIWA:    COWS:     Musculoskeletal: Strength & Muscle Tone: decreased Gait & Station: unsteady Patient leans: Front  Psychiatric Specialty Exam: Physical Exam Vitals and nursing note reviewed.  Constitutional:      Appearance: He is  well-developed.  HENT:     Head: Normocephalic and atraumatic.  Eyes:     Conjunctiva/sclera: Conjunctivae normal.     Pupils: Pupils are equal, round, and reactive to light.  Cardiovascular:     Heart sounds: Normal heart sounds.  Pulmonary:     Effort: Pulmonary effort is normal.  Abdominal:     Palpations: Abdomen is soft.  Musculoskeletal:        General: Normal range of motion.     Cervical back: Normal range of motion.  Skin:    General: Skin is warm and dry.  Neurological:     Mental Status: He is alert.  Psychiatric:        Attention and Perception: He is inattentive.        Mood and Affect: Affect is flat.     Review of Systems  Constitutional: Negative.   HENT: Negative.   Eyes: Negative.   Respiratory: Negative.   Cardiovascular: Negative.   Gastrointestinal: Negative.   Musculoskeletal: Negative.   Skin: Negative.   Neurological: Negative.   Psychiatric/Behavioral: Negative.     Blood pressure 115/74, pulse 96, temperature 98.5 F (36.9 C), temperature source Oral, resp. rate 16, height 5\' 9"  (1.753 m), weight 62.6 kg, SpO2 99 %.Body mass index is 20.38 kg/m.  General Appearance: Disheveled  Eye Contact:  Minimal  Speech:  Slow  Volume:  Decreased  Mood:  Euthymic  Affect:  Constricted  Thought Process:  Disorganized  Orientation:  Full (Time, Place, and Person)  Thought Content:  Illogical  Suicidal Thoughts:  No  Homicidal Thoughts:  No  Memory:  Immediate;   Poor Recent;   Poor Remote;   Poor  Judgement:  Impaired  Insight:  Shallow  Psychomotor Activity:  Decreased  Concentration:  Concentration: Poor  Recall:  Poor  Fund of Knowledge:  Fair  Language:  Fair  Akathisia:  No  Handed:  Right  AIMS (if indicated):     Assets:  Desire for Improvement  ADL's:  Impaired  Cognition:  Impaired,  Moderate  Sleep:  Number of Hours: 8.25     Treatment Plan Summary: Daily contact with patient to assess and evaluate symptoms and progress in  treatment, Medication management and Plan Thank you to neurology for follow-up.  No change to any current medicine.  Still awaiting placement  , MD 05/07/2020, 3:57 PM

## 2020-05-07 NOTE — Progress Notes (Signed)
D- Patient alert and oriented. Affect/mood is worried and blunted.. Pt denies SI, HI, AVH, and pain. Pt has been withdrawn in his room for most of the day. [name]   A- Scheduled medications administered to patient, per MD orders. Support and encouragement provided.  Routine safety checks conducted every 15 minutes.  Patient informed to notify staff with problems or concerns.  R- No adverse drug reactions noted. Patient contracts for safety at this time. Patient compliant with medications and treatment plan. Patient receptive, calm, and cooperative. Patient interacts well with others on the unit.  Patient remains safe at this time .Torrie Mayers RN

## 2020-05-07 NOTE — Progress Notes (Signed)
Patient per hospitalist's order is transferred to medical floor 2 A for close monitoring.

## 2020-05-07 NOTE — H&P (Signed)
History and Physical    Lance Ray BTD:[id number] DOB: 29-Jun-1971 DOA: 05/07/2020  PCP: Patient, No Pcp Per  Patient coming from: Behavior health  I have personally briefly reviewed patient's old medical records in [facility]  Chief Complaint: seizure  HPI: Lance Ray is a 49 y.o. male with medical history significant for dementia, agitation and seizure who hospitalist service is being consulted for recurrent seizures.   Patient initally admitted on 6/30 under hospitalist service for generalized tonic clonic seizure due to non-compliance with Depakote. He was loaded with IV lorazepam and keppra and admitted to PCU. Had negative MRI brain and CT head. Hospital course was complicated by psychosis and agitation and required precedex gtt for agitation. Psychiatry was consulted and he was ultimately discharged to behavior health on 7/16 and was IVC'ed.  Hospitalist service being consulted by behavior health NP tonight after sitter heard patient scream and witnessed a generalized tonic-clonic seizure that lasted about 2 minutes. He was subsequently given 2mg  of Ativan IV after seizure subsided. Patient does not recall event leading up to seizure. Not too sure why he was originally hospitalized. He is able to tell me his name, year, city of the hospital is in and current president. At times he would just muttered unintelligible speech. Denies headache, blurred vision. No chest pain or shortness of breath.   Patient has been followed by neurology during this admission. He is on Lamtical 175mg  in the morning and 150mg  in the afternoon with plans to transition to 175mg  BID on 8/14. Also was getting Keppra 100mg  in the morning and 1500 in the afternoon with plans to decrease him down to 1000mg  BID on 8/14.     Review of Systems:  Unable to fully obtain since patient is a poor historian   Past Medical History:  Diagnosis Date  . Seizures (HCC)     Past Surgical History:   Procedure Laterality Date  . SKIN GRAFT       reports that he has been smoking. He uses smokeless tobacco. He reports current alcohol use of about 2.0 standard drinks of alcohol per week. He reports that he does not use drugs. Social History  No Known Allergies  Family History  Problem Relation Age of Onset  . Seizures Mother   . Cerebral aneurysm Father      Prior to Admission medications   Medication Sig Start Date End Date Taking? Authorizing Provider  benztropine (COGENTIN) 1 MG tablet Take 1 tablet (1 mg total) by mouth 2 (two) times daily. 04/06/20 05/06/20  9/14, MD  clonazePAM (KLONOPIN) 0.25 MG disintegrating tablet Take 1 tablet (0.25 mg total) by mouth 2 (two) times daily. 04/06/20   , MD  DULoxetine (CYMBALTA) 30 MG capsule Take 1 capsule (30 mg total) by mouth daily. 04/07/20   04/08/20, MD  haloperidol (HALDOL) 0.5 MG tablet Take 0.5 tablets (0.25 mg total) by mouth 2 (two) times daily. 04/06/20   Jae Dire, MD  lamoTRIgine (LAMICTAL) 150 MG tablet Take 1 tablet (150 mg total) by mouth 2 (two) times daily. 04/06/20   Jae Dire, MD  levETIRAcetam (KEPPRA) 1000 MG tablet Take 1 tablet (1,000 mg total) by mouth 2 (two) times daily. 04/06/20   Jae Dire, MD  OLANZapine zydis (ZYPREXA) 10 MG disintegrating tablet Take 1 tablet (10 mg total) by mouth at bedtime. 04/09/20   Jae Dire, MD  thiamine 100 MG tablet Take 1 tablet (100 mg  total) by mouth daily. 04/07/20   Jae Dire, MD    Physical Exam: Vitals:   05/08/20 0000  BP: (!) 123/91  Pulse: (!) 105  Resp: 19  Temp: 99.8 F (37.7 C)  TempSrc: Oral  SpO2: 100%  Weight: 65.2 kg  Height: 5\' 9"  (1.753 m)    Constitutional: NAD, calm, comfortable, male appearing older than stated age laying in bed. Initially he was in the shower when I first arrived to evaluate him. Vitals:   05/08/20 0000  BP: (!) 123/91  Pulse: (!) 105  Resp: 19  Temp: 99.8 F (37.7 C)  TempSrc:  Oral  SpO2: 100%  Weight: 65.2 kg  Height: 5\' 9"  (1.753 m)   Eyes: PERRL, lids and conjunctivae normal ENMT: Mucous membranes are moist. No hematoma on tongue. Neck: normal, supple Respiratory: clear to auscultation bilaterally, no wheezing, no crackles. Normal respiratory effort. No accessory muscle use.  Cardiovascular: Regular rate and rhythm, no murmurs / rubs / gallops. No extremity edema.   Abdomen: no tenderness, no masses palpated.  Bowel sounds positive.  Musculoskeletal: no clubbing / cyanosis. No joint deformity upper and lower extremities. Good ROM, no contractures. Normal muscle tone.  Skin: no rashes, lesions, ulcers. No induration Neurologic: CN 2-12 grossly intact. Sensation intact, DTR normal. Strength 5/5 in all 4. Intact finger to nose. Normal extraocular movement without nystagmus. Psychiatric:Alert and oriented x 3. Normal mood. Sometimes would mutter unintelligible speech.    Labs on Admission: I have personally reviewed following labs and imaging studies  CBC: No results for input(s): WBC, NEUTROABS, HGB, HCT, MCV, PLT in the last 168 hours. Basic Metabolic Panel: No results for input(s): NA, K, CL, CO2, GLUCOSE, BUN, CREATININE, CALCIUM, MG, PHOS in the last 168 hours. GFR: Estimated Creatinine Clearance: 79.9 mL/min (by C-G formula based on SCr of 1.02 mg/dL). Liver Function Tests: No results for input(s): AST, ALT, ALKPHOS, BILITOT, PROT, ALBUMIN in the last 168 hours. No results for input(s): LIPASE, AMYLASE in the last 168 hours. No results for input(s): AMMONIA in the last 168 hours. Coagulation Profile: No results for input(s): INR, PROTIME in the last 168 hours. Cardiac Enzymes: No results for input(s): CKTOTAL, CKMB, CKMBINDEX, TROPONINI in the last 168 hours. BNP (last 3 results) No results for input(s): PROBNP in the last 8760 hours. HbA1C: No results for input(s): HGBA1C in the last 72 hours. CBG: No results for input(s): GLUCAP in the last  168 hours. Lipid Profile: No results for input(s): CHOL, HDL, LDLCALC, TRIG, CHOLHDL, LDLDIRECT in the last 72 hours. Thyroid Function Tests: No results for input(s): TSH, T4TOTAL, FREET4, T3FREE, THYROIDAB in the last 72 hours. Anemia Panel: No results for input(s): VITAMINB12, FOLATE, FERRITIN, TIBC, IRON, RETICCTPCT in the last 72 hours. Urine analysis:    Component Value Date/Time   COLORURINE YELLOW (A) 05/27/2016 0200   APPEARANCEUR CLEAR (A) 05/27/2016 0200   LABSPEC 1.020 05/27/2016 0200   PHURINE 5.0 05/27/2016 0200   GLUCOSEU NEGATIVE 05/27/2016 0200   HGBUR NEGATIVE 05/27/2016 0200   BILIRUBINUR NEGATIVE 05/27/2016 0200   KETONESUR TRACE (A) 05/27/2016 0200   PROTEINUR 100 (A) 05/27/2016 0200   NITRITE NEGATIVE 05/27/2016 0200   LEUKOCYTESUR NEGATIVE 05/27/2016 0200    Radiological Exams on Admission: No results found.    Assessment/Plan  Breakthough seizure Pt being followed by neurology. Currently on Lamtical and Keppra but had recurrent seizure tonight at Behavior health. Had negative MRI brain on 7/4 and negative CT head on 6/30. Has been on  Lamictal 175mg  in the AM and 150mg  in the afternoon with plans to switch to 175mg  BID on 8/14.  Keppra 100mg  in the morning and 1500 in the afternoon with plans to decrease him down to 1000mg  BID on 8/14.  Tele-neuro consulted tonight and I spoke with neurologist Dr. who recommended to continue Lamictal at increased dose of 175 mg twice daily and to increase Keppra to 1500 twice daily. PRN for IV Ativan 2mg  for seizure greater than 3 minutes Will obtain stat CBC, CMP and Mg   Agitation/IVC Will need to re-consult psychiatry in the morning. IVC status Continue daily Haldol, Zyprexa, Klonopin, and melatonin as he was getting at Behavior health.   Status is: Inpatient  Remains inpatient appropriate because:Inpatient level of care appropriate due to severity of illness   Dispo: The patient is from: Home               Anticipated d/c is to: TBD/IVC status              Anticipated d/c date is: 2 days              Patient currently is not medically stable to d/c.         9/14 DO [facility]   If 7PM-7AM, please contact night-coverage [email or web address]   05/08/2020, 1:29 AM

## 2020-05-07 NOTE — Progress Notes (Signed)
Pt has sitter at bedside. Pt is safe, Lance Ray

## 2020-05-07 NOTE — Progress Notes (Signed)
Recreation Therapy Notes  Date: 05/07/2020  Time: 9:30 am   Location: Craft room     Behavioral response: N/A   Intervention Topic: Happiness    Discussion/Intervention: Patient did not attend group.   Clinical Observations/Feedback:  Patient did not attend group.   Dondrell Loudermilk LRT/CTRS        Myrtha Tonkovich 05/07/2020 11:28 AM

## 2020-05-07 NOTE — Tx Team (Signed)
Interdisciplinary Treatment and Diagnostic Plan Update  05/07/2020 Time of Session:  8:30AM STEVEN BASSO MRN: [id number]  Principal Diagnosis: Dementia [facility])  Secondary Diagnoses: Principal Problem:   Dementia (HCC) Active Problems:   Recurrent seizures (HCC)   Seizure disorder (HCC)   Psychosis (HCC)   Failure to thrive in adult   Stuttering   Depression   Anemia due to vitamin B12 deficiency   Current Medications:  Current Facility-Administered Medications  Medication Dose Route Frequency Provider Last Rate Last Admin  . acetaminophen (TYLENOL) tablet 650 mg  650 mg Oral Q6H PRN [name], [name], MD   650 mg at 04/20/20 1248  . alum & mag hydroxide-simeth (MAALOX/MYLANTA) 200-200-20 MG/5ML suspension 30 mL  30 mL Oral Q4H PRN [name], [name], MD      . clonazePAM (KLONOPIN) disintegrating tablet 0.5 mg  0.5 mg Oral Daily [name], MD   0.5 mg at 05/07/20 0818  . diphenhydrAMINE (BENADRYL) capsule 50 mg  50 mg Oral Once Gillermo Murdoch, NP       Or  . diphenhydrAMINE (BENADRYL) injection 50 mg  50 mg Intramuscular Once Gillermo Murdoch, NP      . haloperidol (HALDOL) tablet 2 mg  2 mg Oral Q6H PRN [name], [name], MD   2 mg at 05/06/20 0325   Or  . haloperidol lactate (HALDOL) injection 1 mg  1 mg Intravenous Q6H PRN [name], [name], MD   1 mg at 04/17/20 0110  . haloperidol (HALDOL) tablet 3 mg  3 mg Oral QHS [name], MD   3 mg at 05/06/20 2115  . haloperidol lactate (HALDOL) injection 5 mg  5 mg Intramuscular Once Gillermo Murdoch, NP      . Melene Muller ON 05/08/2020] lamoTRIgine (LAMICTAL) tablet 175 mg  175 mg Oral BID [name], MD      . Melene Muller ON 05/08/2020] levETIRAcetam (KEPPRA) tablet 1,000 mg  1,000 mg Oral BID [name], MD      . LORazepam (ATIVAN) injection 2 mg  2 mg Intramuscular Once PRN [name], MD      . magnesium hydroxide (MILK OF MAGNESIA) suspension 30 mL  30 mL Oral Daily PRN [name], [name], MD       . megestrol (MEGACE) tablet 160 mg  160 mg Oral Daily [name], MD   160 mg at 05/07/20 0818  . melatonin tablet 2.5 mg  2.5 mg Oral QHS [name], MD   2.5 mg at 05/06/20 2115  . OLANZapine (ZYPREXA) injection 10 mg  10 mg Intramuscular Q6H PRN [name], [name], MD   10 mg at 05/04/20 0422  . OLANZapine zydis (ZYPREXA) disintegrating tablet 25 mg  25 mg Oral QHS [name], MD   25 mg at 05/06/20 2114  . OLANZapine zydis (ZYPREXA) disintegrating tablet 5 mg  5 mg Oral Daily [name], MD   5 mg at 05/07/20 4174  . ondansetron (ZOFRAN) tablet 4 mg  4 mg Oral Q6H PRN [name], [name], MD       Or  . ondansetron (ZOFRAN) injection 4 mg  4 mg Intravenous Q6H PRN [name], [name], MD      . pyridOXINE (VITAMIN B-6) tablet 50 mg  50 mg Oral Daily [name], MD   50 mg at 05/07/20 0817  . thiamine tablet 100 mg  100 mg Oral Daily [name], [name], MD   100 mg at 05/07/20 0817  . vitamin B-12 (CYANOCOBALAMIN) tablet  1,000 mcg  1,000 mcg Oral Daily [name], MD   1,000 mcg at 05/07/20 5093   PTA Medications: Medications Prior to Admission  Medication Sig Dispense Refill Last Dose  . benztropine (COGENTIN) 1 MG tablet Take 1 tablet (1 mg total) by mouth 2 (two) times daily. 60 tablet 0   . clonazePAM (KLONOPIN) 0.25 MG disintegrating tablet Take 1 tablet (0.25 mg total) by mouth 2 (two) times daily. 60 tablet 0   . DULoxetine (CYMBALTA) 30 MG capsule Take 1 capsule (30 mg total) by mouth daily.  3   . haloperidol (HALDOL) 0.5 MG tablet Take 0.5 tablets (0.25 mg total) by mouth 2 (two) times daily.     [name] lamoTRIgine (LAMICTAL) 150 MG tablet Take 1 tablet (150 mg total) by mouth 2 (two) times daily.     [name] levETIRAcetam (KEPPRA) 1000 MG tablet Take 1 tablet (1,000 mg total) by mouth 2 (two) times daily.     [name] OLANZapine zydis (ZYPREXA) 10 MG disintegrating tablet Take 1 tablet (10 mg total) by mouth at bedtime. 30 tablet 0   . thiamine 100 MG tablet Take 1  tablet (100 mg total) by mouth daily.       Patient Stressors: Financial difficulties Health problems Medication change or noncompliance Substance abuse Traumatic event  Patient Strengths: Motivation for treatment/growth Supportive family/friends  Treatment Modalities: Medication Management, Group therapy, Case management,  1 to 1 session with clinician, Psychoeducation, Recreational therapy.   Physician Treatment Plan for Primary Diagnosis: Dementia [facility]) Long Term Goal(s): Improvement in symptoms so as ready for discharge Improvement in symptoms so as ready for discharge   Short Term Goals: Ability to identify and develop effective coping behaviors will improve Ability to maintain clinical measurements within normal limits will improve Compliance with prescribed medications will improve Ability to demonstrate self-control will improve Compliance with prescribed medications will improve  Medication Management: Evaluate patient's response, side effects, and tolerance of medication regimen.  Therapeutic Interventions: 1 to 1 sessions, Unit Group sessions and Medication administration.  Evaluation of Outcomes: Progressing  Physician Treatment Plan for Secondary Diagnosis: Principal Problem:   Dementia (HCC) Active Problems:   Recurrent seizures (HCC)   Seizure disorder (HCC)   Psychosis (HCC)   Failure to thrive in adult   Stuttering   Depression   Anemia due to vitamin B12 deficiency  Long Term Goal(s): Improvement in symptoms so as ready for discharge Improvement in symptoms so as ready for discharge   Short Term Goals: Ability to identify and develop effective coping behaviors will improve Ability to maintain clinical measurements within normal limits will improve Compliance with prescribed medications will improve Ability to demonstrate self-control will improve Compliance with prescribed medications will improve     Medication Management: Evaluate patient's  response, side effects, and tolerance of medication regimen.  Therapeutic Interventions: 1 to 1 sessions, Unit Group sessions and Medication administration.  Evaluation of Outcomes: Progressing   RN Treatment Plan for Primary Diagnosis: Dementia (HCC) Long Term Goal(s): Knowledge of disease and therapeutic regimen to maintain health will improve  Short Term Goals: Ability to demonstrate self-control, Ability to identify and develop effective coping behaviors will improve and Compliance with prescribed medications will improve  Medication Management: RN will administer medications as ordered by provider, will assess and evaluate patient's response and provide education to patient for prescribed medication. RN will report any adverse and/or side effects to prescribing provider.  Therapeutic Interventions: 1 on 1 counseling sessions, Psychoeducation, Medication administration, Evaluate responses to  treatment, Monitor vital signs and CBGs as ordered, Perform/monitor CIWA, COWS, AIMS and Fall Risk screenings as ordered, Perform wound care treatments as ordered.  Evaluation of Outcomes: Progressing   LCSW Treatment Plan for Primary Diagnosis: Dementia (HCC) Long Term Goal(s): Safe transition to appropriate next level of care at discharge, Engage patient in therapeutic group addressing interpersonal concerns.  Short Term Goals: Engage patient in aftercare planning with referrals and resources  Therapeutic Interventions: Assess for all discharge needs, 1 to 1 time with Social worker, Explore available resources and support systems, Assess for adequacy in community support network, Educate family and significant other(s) on suicide prevention, Complete Psychosocial Assessment, Interpersonal group therapy.  Evaluation of Outcomes: Progressing   Progress in Treatment: Attending groups: No. Participating in groups: No. Taking medication as prescribed: Yes. Toleration medication:  Yes. Family/Significant other contact made: Yes, individual(s) contacted:  pt gave verbal permission to speak with his sisters. Patient understands diagnosis: No. Discussing patient identified problems/goals with staff: Yes. Medical problems stabilized or resolved: No. Denies suicidal/homicidal ideation: Yes. Issues/concerns per patient self-inventory: No. Other: Patient requires 1:1 to move around millieu  New problem(s) identified: No, Describe:  None  New Short Term/Long Term Goal(s): medication stabilization, elimination of SI thoughts, development of comprehensive mental wellness plan  Update 05/07/2020:  No changes at this time.   Patient Goals: Patient spoke about his seizures. Patient is still not aware of the date/time of day. Update 05/07/2020:  No changes at this time.   Discharge Plan or Barriers: Patient has still not transitioned to a nursing home due to funding. Family is working on [employer]. Update 04/22/20-Pt sister is pursuing guardianship, medicaid and disability on behalf of the pt. Sister expressed concern about pt not being able to properly care for himself and request pt be referred to a SNF. Sister has phone interview with social security on 8/13. Update 04/26/20- No change at this time. DC plan TBD. Update 05/02/20: No changes Update 05/07/2020:  No changes at this time.    Reason for Continuation of Hospitalization: Other; describe Dementia   Estimated Length of Stay: TBD   Attendees: Patient: 05/07/2020 1:26 PM  Physician: Dr. Toni Amend, MD 05/07/2020 1:26 PM  Nursing: Torrie Mayers, RN 05/07/2020 1:26 PM  RN Care Manager: 05/07/2020 1:26 PM  Social Worker: Penni Homans MSW, LCSW  05/07/2020 1:26 PM  Recreational Therapist:  05/07/2020 1:26 PM  Other:  05/07/2020 1:26 PM  Other:  05/07/2020 1:26 PM  Other: 05/07/2020 1:26 PM    Scribe for Treatment Team: Harden Mo, LCSW 05/07/2020 1:26 PM

## 2020-05-07 NOTE — Progress Notes (Addendum)
Writer was called in the room that  patient was having a seizure upon assessment patient was noted jerking and shaking vigorously with his teeth clinched together, his tight outfit was loosened, he was protected with side rails and a floor mat in place, the seizure was timed and it lasted for two minutes, immediately he was turned to the side and VS checked blood pressure and pulse was elevated , respiration was labored he was assisted to a semi fowler position and his breathing was better, he appeared confused and restless ,he was offered emotional support and was medicated with 2 mg IM lorazepam for agitation/ seizure.   MD/NP on call notified , was ordered to continue to monitor unitil Hospitalist comes to see him, will continue to monitor closely

## 2020-05-08 LAB — COMPREHENSIVE METABOLIC PANEL
ALT: 22 U/L (ref 0–44)
AST: 17 U/L (ref 15–41)
Albumin: 3.8 g/dL (ref 3.5–5.0)
Alkaline Phosphatase: 84 U/L (ref 38–126)
Anion gap: 7 (ref 5–15)
BUN: 22 mg/dL — ABNORMAL HIGH (ref 6–20)
CO2: 23 mmol/L (ref 22–32)
Calcium: 8.8 mg/dL — ABNORMAL LOW (ref 8.9–10.3)
Chloride: 108 mmol/L (ref 98–111)
Creatinine, Ser: 0.99 mg/dL (ref 0.61–1.24)
GFR calc Af Amer: >60 mL/min (ref >60)
GFR calc non Af Amer: >60 mL/min (ref >60)
Glucose, Bld: 98 mg/dL (ref 70–99)
Potassium: 3.8 mmol/L (ref 3.5–5.1)
Sodium: 138 mmol/L (ref 135–145)
Total Bilirubin: 0.6 mg/dL (ref 0.3–1.2)
Total Protein: 6.5 g/dL (ref 6.5–8.1)

## 2020-05-08 LAB — CBC
HCT: 33.6 % — ABNORMAL LOW (ref 39.0–52.0)
Hemoglobin: 11.9 g/dL — ABNORMAL LOW (ref 13.0–17.0)
MCH: 29.8 pg (ref 26.0–34.0)
MCHC: 35.4 g/dL (ref 30.0–36.0)
MCV: 84.2 fL (ref 80.0–100.0)
Platelets: 327 10*3/uL (ref 150–400)
RBC: 3.99 MIL/uL — ABNORMAL LOW (ref 4.22–5.81)
RDW: 13.5 % (ref 11.5–15.5)
WBC: 7.8 10*3/uL (ref 4.0–10.5)
nRBC: 0 % (ref 0.0–0.2)

## 2020-05-08 LAB — MAGNESIUM: Magnesium: 2.2 mg/dL (ref 1.7–2.4)

## 2020-05-08 MED ORDER — THIAMINE HCL 100 MG PO TABS
100.0000 mg | ORAL_TABLET | Freq: Every day | ORAL | Status: DC
  Administered 2020-05-08 – 2020-08-12 (×97): 100 mg via ORAL
  Filled 2020-05-08 (×96): qty 1

## 2020-05-08 MED ORDER — OLANZAPINE 5 MG PO TABS
5.0000 mg | ORAL_TABLET | Freq: Every day | ORAL | Status: DC
  Administered 2020-05-08 – 2020-05-11 (×4): 5 mg via ORAL
  Filled 2020-05-08 (×4): qty 1

## 2020-05-08 MED ORDER — LORAZEPAM 2 MG/ML IJ SOLN: 2.0000 mg | INTRAMUSCULAR | Status: DC | PRN

## 2020-05-08 MED ORDER — CLONAZEPAM 0.25 MG PO TBDP
0.5000 mg | ORAL_TABLET | Freq: Every day | ORAL | Status: DC
  Administered 2020-05-08 – 2020-05-11 (×5): 0.5 mg via ORAL
  Filled 2020-05-08 (×5): qty 2

## 2020-05-08 MED ORDER — LEVETIRACETAM ER 500 MG PO TB24
1000.0000 mg | ORAL_TABLET | Freq: Two times a day (BID) | ORAL | Status: DC
  Filled 2020-05-08 (×2): qty 2

## 2020-05-08 MED ORDER — MELATONIN 5 MG PO TABS
5.0000 mg | ORAL_TABLET | Freq: Every day | ORAL | Status: DC
  Administered 2020-05-08 – 2020-08-11 (×95): 5 mg via ORAL
  Filled 2020-05-08 (×96): qty 1

## 2020-05-08 MED ORDER — LEVETIRACETAM ER 500 MG PO TB24
1500.0000 mg | ORAL_TABLET | Freq: Two times a day (BID) | ORAL | Status: DC
  Administered 2020-05-08 – 2020-06-04 (×56): 1500 mg via ORAL
  Filled 2020-05-08 (×62): qty 3

## 2020-05-08 MED ORDER — HALOPERIDOL 2 MG PO TABS
2.0000 mg | ORAL_TABLET | Freq: Four times a day (QID) | ORAL | Status: DC | PRN
  Filled 2020-05-08: qty 1

## 2020-05-08 MED ORDER — VITAMIN B-12 1000 MCG PO TABS
1000.0000 ug | ORAL_TABLET | Freq: Every day | ORAL | Status: DC
  Administered 2020-05-08 – 2020-08-12 (×97): 1000 ug via ORAL
  Filled 2020-05-08 (×97): qty 1

## 2020-05-08 MED ORDER — LAMOTRIGINE 25 MG PO TABS
175.0000 mg | ORAL_TABLET | Freq: Two times a day (BID) | ORAL | Status: DC
  Administered 2020-05-08 – 2020-06-12 (×70): 175 mg via ORAL
  Filled 2020-05-08: qty 3
  Filled 2020-05-08 (×2): qty 7
  Filled 2020-05-08: qty 3
  Filled 2020-05-08: qty 7
  Filled 2020-05-08: qty 3
  Filled 2020-05-08: qty 7
  Filled 2020-05-08 (×18): qty 3
  Filled 2020-05-08 (×2): qty 7
  Filled 2020-05-08: qty 3
  Filled 2020-05-08: qty 7
  Filled 2020-05-08 (×2): qty 3
  Filled 2020-05-08: qty 7
  Filled 2020-05-08: qty 3
  Filled 2020-05-08 (×2): qty 7
  Filled 2020-05-08 (×3): qty 3
  Filled 2020-05-08: qty 7
  Filled 2020-05-08 (×2): qty 3
  Filled 2020-05-08: qty 7
  Filled 2020-05-08 (×3): qty 3
  Filled 2020-05-08: qty 7
  Filled 2020-05-08 (×2): qty 3
  Filled 2020-05-08: qty 7
  Filled 2020-05-08: qty 3
  Filled 2020-05-08: qty 7
  Filled 2020-05-08: qty 3
  Filled 2020-05-08: qty 7
  Filled 2020-05-08 (×4): qty 3
  Filled 2020-05-08: qty 7
  Filled 2020-05-08 (×4): qty 3
  Filled 2020-05-08: qty 7
  Filled 2020-05-08 (×2): qty 3
  Filled 2020-05-08 (×3): qty 7
  Filled 2020-05-08 (×2): qty 3

## 2020-05-08 MED ORDER — VITAMIN B-6 50 MG PO TABS
50.0000 mg | ORAL_TABLET | Freq: Every day | ORAL | Status: DC
  Administered 2020-05-08 – 2020-08-12 (×96): 50 mg via ORAL
  Filled 2020-05-08 (×100): qty 1

## 2020-05-08 NOTE — Consult Note (Signed)
[facility] TeleNeurology Consult Services  Stat Consult  Date of Service:   05/08/2020 01:18:55  Impression:     .  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 had breakthrough generalized 2 min seizure tonight. Recommend 500 keppra x 1 now and increase keppra to 1500 mg b.i.d. for now. Keppra weaning may be reconsidered in the future, or replacement with depakote. Neurology service following pt. PRN ativan 2 mg for seizure < not resolving spontaneously.  Metrics: [facility] Notification Time: 05/08/2020 01:16:48 Stamp Time: 05/08/2020 01:18:55 Callback Response Time: 05/08/2020 01:20:46  Our recommendations are outlined below.  Recommendations:     .  Ativan 2 mg Stat     .  Recommend 500 keppra x 1 now and increase keppra to 1500 mg b.i.d. for now. Keppra weaning may be reconsidered in the future, or replacement with depakote. Neurology service following pt.     [name]  PRN ativan 2 mg for seizure < not resolving spontaneously.   Disposition: Neurology Follow Up Recommended  Sign Out:     .  Discussed with Primary Attending  ----------------------------------------------------------------------------------------------------  Chief Complaint: seizures.  History of Present Illness: [name].  Lance Ray is a 49 y.o. male with medical history significant for dementia, agitation and seizure who hospitalist service is being consulted for recurrent epilepsy.   Patient initally admitted on 6/30 under hospitalist service for generalized tonic clonic seizure due to non-compliance with Depakote. He was loaded with IV lorazepam and keppra and admitted to PCU. Had negative MRI brain and CT head. Hospital course was complicated by psychosis and agitation and  required precedex gtt for agitation. Psychiatry was consulted and he was ultimately discharged to behavior health on 7/16 and was IVC'ed. Attempts are being made to wean from keppra and transition to lamictal. Tonight pt had another seizure. Two minute duration, generalized tonic clonic movements, resolved spontaneously. He received ativan after the seizure had resolved. He is currently being followed by in house neurology during the day. Pt was on lamictal 175 mg in AM and 150 mg in PM, increased to 175 mg bid on 8/14. Pt is also on 1000 mg keppra in AM and 1500 mg of Keppra PM.       Examination: BP(123/91), Pulse(105), Blood Glucose(88) 1A: Level of Consciousness - Alert; keenly responsive + 0 1B: Ask Month and Age - Both Questions Right + 0 1C: Blink Eyes & Squeeze Hands - Performs Both Tasks + 0 2: Test Horizontal Extraocular Movements - Normal + 0 3: Test Visual Fields - No Visual Loss + 0 4: Test Facial Palsy (Use Grimace if Obtunded) - Normal symmetry + 0 5A: Test Left Arm Motor Drift - No Drift for 10 Seconds + 0 5B: Test Right Arm Motor Drift - No Drift for 10 Seconds + 0 6A: Test Left Leg Motor Drift - No Drift for 5 Seconds + 0 6B: Test Right Leg Motor Drift - No Drift for 5 Seconds + 0 7: Test Limb Ataxia (FNF/Heel-Shin) - No Ataxia + 0 8: Test Sensation - Normal; No sensory loss + 0 9: Test Language/Aphasia - Normal; No aphasia + 0 10: Test Dysarthria - Normal + 0 11: Test Extinction/Inattention - No abnormality + 0  NIHSS Score: 0    Patient is being evaluated for possible acute neurologic impairment and high  probability of imminent or life-threatening deterioration. I spent total of 20 minutes providing care to this patient, including time for face to face visit via telemedicine, review of medical records, imaging studies and discussion of findings with providers, the patient and/or family.   Dr Rutherford Guys   [facility] [phone]  Case  [id number]

## 2020-05-08 NOTE — Progress Notes (Signed)
PROGRESS NOTE  Lance Ray:[id number] DOB: 05-09-1971 DOA: 05/07/2020 PCP: Patient, No Pcp Per  HPI/Recap of past 24 hours:  HPI: Lance Ray is a 49 y.o. male with medical history significant for dementia, agitation and seizure who hospitalist service is being consulted for recurrent seizures.   Patient initally admitted on 6/30 under hospitalist service for generalized tonic clonic seizure due to non-compliance with Depakote. He was loaded with IV lorazepam and keppra and admitted to PCU. Had negative MRI brain and CT head. Hospital course was complicated by psychosis and agitation and required precedex gtt for agitation. Psychiatry was consulted and he was ultimately discharged to behavior health on 7/16 and was IVC'ed.  Hospitalist service being consulted by behavior health NP tonight after sitter heard patient scream and witnessed a generalized tonic-clonic seizure that lasted about 2 minutes. He was subsequently given 2mg  of Ativan IV after seizure subsided. Patient does not recall event leading up to seizure. Not too sure why he was originally hospitalized. He is able to tell me his name, year, city of the hospital is in and current president. At times he would just muttered unintelligible speech. Denies headache, blurred vision. No chest pain or shortness of breath.   Patient has been followed by neurology during this admission. He is on Lamtical 175mg  in the morning and 150mg  in the afternoon with plans to transition to 175mg  BID on 8/14. Also was getting Keppra 100mg  in the morning and 1500 in the afternoon with plans to decrease him down to 1000mg  BID on 8/14.   05/08/20: Seen and examined.  He is alert oriented x3.  Denies any pain.  He has no new complaints at this time.  No reported recurrent seizures overnight.    Assessment/Plan: Principal Problem:   Seizure (HCC) Active Problems:   Psychosis (HCC)   Breakthrough seizures, likely secondary to medication  noncompliance AEDs adjusted by neurology Currently on Lamictal 1 7 5  mg twice daily, Klonopin 1 5 mg daily, Keppra 24hr tab 1500 mg twice daily Continue seizure precautions IV Ativan as needed for active seizures greater than 3 minutes as recommended by neurology Appreciate neurology's assistance  Seizure disorder Management per above Obtain AEDs levels  Agitation/behavioral disturbance IVC in place Admitted from behavioral health Continue psych medications Currently on Zyprexa One-to-one sitter in place, continue    Code Status: Full code  Family Communication: None at bedside     Consultants:  Neurology  Procedures:  None  Antimicrobials:  None  DVT prophylaxis: Subcu Lovenox daily  Status is: Inpatient    Dispo: The patient is from: Behavioral health               Anticipated d/c is to: Behavioral health              Anticipated d/c date is: 05/09/2020              Patient currently not stable for discharge due to ongoing management of recent seizure.        Objective: Vitals:   05/08/20 0000 05/08/20 0606  BP: (!) 123/91 120/73  Pulse: (!) 105 91  Resp: 19 18  Temp: 99.8 F (37.7 C) 98 F (36.7 C)  TempSrc: Oral Oral  SpO2: 100% 100%  Weight: 65.2 kg 64.9 kg  Height: 5\' 9"  (1.753 m)     Intake/Output Summary (Last 24 hours) at 05/08/2020 1246 Last data filed at 05/08/2020 0600 Gross per 24 hour  Intake 416.27 ml  Output --  Net 416.27 ml   Filed Weights   05/08/20 0000 05/08/20 0606  Weight: 65.2 kg 64.9 kg    Exam:  . General: 49 y.o. year-old male well developed well nourished in no acute distress.  Alert and oriented x3. . Cardiovascular: Regular rate and rhythm with no rubs or gallops.  No thyromegaly or JVD noted.   [name] Respiratory: Clear to auscultation with no wheezes or rales. Good inspiratory effort. . Abdomen: Soft nontender nondistended with normal bowel sounds x4 quadrants. . Musculoskeletal: No lower extremity  edema. 2/4 pulses in all 4 extremities. [name] Psychiatry: Mood is appropriate for condition and setting   Data Reviewed: CBC: Recent Labs  Lab 05/08/20 0636  WBC 7.8  HGB 11.9*  HCT 33.6*  MCV 84.2  PLT 327   Basic Metabolic Panel: Recent Labs  Lab 05/08/20 0636  NA 138  K 3.8  CL 108  CO2 23  GLUCOSE 98  BUN 22*  CREATININE 0.99  CALCIUM 8.8*  MG 2.2   GFR: Estimated Creatinine Clearance: 81.9 mL/min (by C-G formula based on SCr of 0.99 mg/dL). Liver Function Tests: Recent Labs  Lab 05/08/20 0636  AST 17  ALT 22  ALKPHOS 84  BILITOT 0.6  PROT 6.5  ALBUMIN 3.8   No results for input(s): LIPASE, AMYLASE in the last 168 hours. No results for input(s): AMMONIA in the last 168 hours. Coagulation Profile: No results for input(s): INR, PROTIME in the last 168 hours. Cardiac Enzymes: No results for input(s): CKTOTAL, CKMB, CKMBINDEX, TROPONINI in the last 168 hours. BNP (last 3 results) No results for input(s): PROBNP in the last 8760 hours. HbA1C: No results for input(s): HGBA1C in the last 72 hours. CBG: No results for input(s): GLUCAP in the last 168 hours. Lipid Profile: No results for input(s): CHOL, HDL, LDLCALC, TRIG, CHOLHDL, LDLDIRECT in the last 72 hours. Thyroid Function Tests: No results for input(s): TSH, T4TOTAL, FREET4, T3FREE, THYROIDAB in the last 72 hours. Anemia Panel: No results for input(s): VITAMINB12, FOLATE, FERRITIN, TIBC, IRON, RETICCTPCT in the last 72 hours. Urine analysis:    Component Value Date/Time   COLORURINE YELLOW (A) 05/27/2016 0200   APPEARANCEUR CLEAR (A) 05/27/2016 0200   LABSPEC 1.020 05/27/2016 0200   PHURINE 5.0 05/27/2016 0200   GLUCOSEU NEGATIVE 05/27/2016 0200   HGBUR NEGATIVE 05/27/2016 0200   BILIRUBINUR NEGATIVE 05/27/2016 0200   KETONESUR TRACE (A) 05/27/2016 0200   PROTEINUR 100 (A) 05/27/2016 0200   NITRITE NEGATIVE 05/27/2016 0200   LEUKOCYTESUR NEGATIVE 05/27/2016 0200   Sepsis  Labs: @LABRCNTIP (procalcitonin:4,lacticidven:4)  )No results found for this or any previous visit (from the past 240 hour(s)).    Studies: No results found.  Scheduled Meds: . clonazepam  0.5 mg Oral Daily  . enoxaparin (LOVENOX) injection  40 mg Subcutaneous Q24H  . lamoTRIgine  175 mg Oral BID  . levETIRAcetam  1,500 mg Oral BID  . melatonin  5 mg Oral QHS  . OLANZapine  5 mg Oral Daily  . vitamin B-6  50 mg Oral Daily  . thiamine  100 mg Oral Daily  . vitamin B-12  1,000 mcg Oral Daily    Continuous Infusions: . sodium chloride 75 mL/hr (05/08/20 0600)     LOS: 1 day     05/10/20, MD [facility] Pager [phone]  If 7PM-7AM, please contact night-coverage [email or web address] Password [facility] 05/08/2020, 12:46 PM

## 2020-05-08 NOTE — Plan of Care (Signed)
  Problem: Education: Goal: Knowledge of General Education information will improve Description: Including pain rating scale, medication(s)/side effects and non-pharmacologic comfort measures Outcome: Progressing   Problem: Health Behavior/Discharge Planning: Goal: Ability to manage health-related needs will improve Outcome: Progressing   Problem: Safety: Goal: Verbalization of understanding the information provided will improve Outcome: Progressing

## 2020-05-09 NOTE — Plan of Care (Signed)

## 2020-05-09 NOTE — Progress Notes (Signed)
PROGRESS NOTE  Lance Ray DVV:[id number] DOB: [birth year] DOA: 05/07/2020 PCP: Patient, No Pcp Per  HPI/Recap of past 24 hours:  HPI: Lance Ray is a 49 y.o. male with medical history significant for dementia, agitation and seizure who hospitalist service is being consulted for recurrent seizures.   Patient initally admitted on 6/30 under hospitalist service for generalized tonic clonic seizure due to non-compliance with Depakote. He was loaded with IV lorazepam and keppra and admitted to PCU. Had negative MRI brain and CT head. Hospital course was complicated by psychosis and agitation and required precedex gtt for agitation. Psychiatry was consulted and he was ultimately discharged to behavior health on 7/16 and was IVC'ed.  Hospitalist service being consulted by behavior health NP tonight after sitter heard patient scream and witnessed a generalized tonic-clonic seizure that lasted about 2 minutes. He was subsequently given 2mg  of Ativan IV after seizure subsided. Patient does not recall event leading up to seizure. Not too sure why he was originally hospitalized. He is able to tell me his name, year, city of the hospital is in and current president. At times he would just muttered unintelligible speech. Denies headache, blurred vision. No chest pain or shortness of breath.   Patient has been followed by neurology during this admission. He is on Lamtical 175mg  in the morning and 150mg  in the afternoon with plans to transition to 175mg  BID on 8/14. Also was getting Keppra 100mg  in the morning and 1500 in the afternoon with plans to decrease him down to 1000mg  BID on 8/14.   05/09/20: Seen and examined.  Somnolent but easily arousable to voices.  No acute events overnight.  No recurrent seizure activity.  Vital signs are stable.  He has no new complaints.  Psychiatry consulted for possible return to behavioral health.  Medically cleared for  discharge.    Assessment/Plan: Principal Problem:   Seizure (HCC) Active Problems:   Psychosis (HCC)   Breakthrough seizures, likely secondary to medication noncompliance AEDs adjusted by neurology Currently on Lamictal 1 7 5  mg twice daily, Klonopin 1 5 mg daily, Keppra 24hr tab 1500 mg twice daily Continue seizure precautions IV Ativan as needed for active seizures greater than 3 minutes as recommended by neurology Appreciate neurology's assistance  Seizure disorder Management per above Obtain AEDs levels  Agitation/behavioral disturbance IVC in place Admitted from behavioral health Continue psych medications Currently on Zyprexa Psych consult One-to-one sitter as needed    Code Status: Full code  Family Communication: None at bedside     Consultants:  Neurology  Procedures:  None  Antimicrobials:  None  DVT prophylaxis: Subcu Lovenox daily  Status is: Inpatient    Dispo: The patient is from: Behavioral health               Anticipated d/c is to: Behavioral health              Anticipated d/c date is: 05/09/2020              Patient currently stable for discharge        Objective: Vitals:   05/09/20 0447 05/09/20 0452 05/09/20 0742 05/09/20 1119  BP: (!) 89/67 (!) 100/48 113/68 114/76  Pulse: 88 88 79 92  Resp: 17 16 17 17   Temp:  98.4 F (36.9 C) 97.7 F (36.5 C) 97.8 F (36.6 C)  TempSrc:   Oral Oral  SpO2: 98% 95% 98% 96%  Weight:      Height:  Intake/Output Summary (Last 24 hours) at 05/09/2020 1430 Last data filed at 05/09/2020 1330 Gross per 24 hour  Intake 2782.19 ml  Output 0 ml  Net 2782.19 ml   Filed Weights   05/08/20 0000 05/08/20 0606  Weight: 65.2 kg 64.9 kg    Exam: No significant changes from prior exam.  . General: 49 y.o. year-old male well developed well nourished in no acute distress.  Somnolent but easily arousable to voices. . Cardiovascular: Regular rate and rhythm with no rubs or gallops.   No thyromegaly or JVD noted.   [name] Respiratory: Clear to auscultation with no wheezes or rales. Good inspiratory effort. . Abdomen: Soft nontender nondistended with normal bowel sounds x4 quadrants. . Musculoskeletal: No lower extremity edema. 2/4 pulses in all 4 extremities. [name] Psychiatry: Mood is appropriate for condition and setting   Data Reviewed: CBC: Recent Labs  Lab 05/08/20 0636  WBC 7.8  HGB 11.9*  HCT 33.6*  MCV 84.2  PLT 327   Basic Metabolic Panel: Recent Labs  Lab 05/08/20 0636  NA 138  K 3.8  CL 108  CO2 23  GLUCOSE 98  BUN 22*  CREATININE 0.99  CALCIUM 8.8*  MG 2.2   GFR: Estimated Creatinine Clearance: 81.9 mL/min (by C-G formula based on SCr of 0.99 mg/dL). Liver Function Tests: Recent Labs  Lab 05/08/20 0636  AST 17  ALT 22  ALKPHOS 84  BILITOT 0.6  PROT 6.5  ALBUMIN 3.8   No results for input(s): LIPASE, AMYLASE in the last 168 hours. No results for input(s): AMMONIA in the last 168 hours. Coagulation Profile: No results for input(s): INR, PROTIME in the last 168 hours. Cardiac Enzymes: No results for input(s): CKTOTAL, CKMB, CKMBINDEX, TROPONINI in the last 168 hours. BNP (last 3 results) No results for input(s): PROBNP in the last 8760 hours. HbA1C: No results for input(s): HGBA1C in the last 72 hours. CBG: No results for input(s): GLUCAP in the last 168 hours. Lipid Profile: No results for input(s): CHOL, HDL, LDLCALC, TRIG, CHOLHDL, LDLDIRECT in the last 72 hours. Thyroid Function Tests: No results for input(s): TSH, T4TOTAL, FREET4, T3FREE, THYROIDAB in the last 72 hours. Anemia Panel: No results for input(s): VITAMINB12, FOLATE, FERRITIN, TIBC, IRON, RETICCTPCT in the last 72 hours. Urine analysis:    Component Value Date/Time   COLORURINE YELLOW (A) 05/27/2016 0200   APPEARANCEUR CLEAR (A) 05/27/2016 0200   LABSPEC 1.020 05/27/2016 0200   PHURINE 5.0 05/27/2016 0200   GLUCOSEU NEGATIVE 05/27/2016 0200   HGBUR NEGATIVE  05/27/2016 0200   BILIRUBINUR NEGATIVE 05/27/2016 0200   KETONESUR TRACE (A) 05/27/2016 0200   PROTEINUR 100 (A) 05/27/2016 0200   NITRITE NEGATIVE 05/27/2016 0200   LEUKOCYTESUR NEGATIVE 05/27/2016 0200   Sepsis Labs: @LABRCNTIP (procalcitonin:4,lacticidven:4)  )No results found for this or any previous visit (from the past 240 hour(s)).    Studies: No results found.  Scheduled Meds: . clonazepam  0.5 mg Oral Daily  . enoxaparin (LOVENOX) injection  40 mg Subcutaneous Q24H  . lamoTRIgine  175 mg Oral BID  . levETIRAcetam  1,500 mg Oral BID  . melatonin  5 mg Oral QHS  . OLANZapine  5 mg Oral Daily  . vitamin B-6  50 mg Oral Daily  . thiamine  100 mg Oral Daily  . vitamin B-12  1,000 mcg Oral Daily    Continuous Infusions: . sodium chloride 75 mL/hr (05/09/20 0448)     LOS: 2 days     05/11/20, MD  [facility] Pager [phone]  If 7PM-7AM, please contact night-coverage [email or web address] Password [facility] 05/09/2020, 2:30 PM

## 2020-05-09 NOTE — Discharge Summary (Signed)
Discharged to med surge unit

## 2020-05-09 NOTE — Progress Notes (Signed)
PT Cancellation Note  Patient Details Name: Lance Ray MRN: [id number] DOB: [birth year]   Cancelled Treatment:    Reason Eval/Treat Not Completed: Fatigue/lethargy limiting ability to participate. Patient sleeping on arrival to room and has been up during the night per staff. Patient agreeable for PT to return tomorrow as appropriate.   Donna Bernard, PT, MPT  Lance Ray 05/09/2020, 3:37 PM

## 2020-05-10 LAB — CBC WITH DIFFERENTIAL/PLATELET
Abs Immature Granulocytes: 0.02 10*3/uL (ref 0.00–0.07)
Basophils Absolute: 0 10*3/uL (ref 0.0–0.1)
Basophils Relative: 0 %
Eosinophils Absolute: 0.2 10*3/uL (ref 0.0–0.5)
Eosinophils Relative: 3 %
HCT: 35.9 % — ABNORMAL LOW (ref 39.0–52.0)
Hemoglobin: 11.9 g/dL — ABNORMAL LOW (ref 13.0–17.0)
Immature Granulocytes: 0 %
Lymphocytes Relative: 14 %
Lymphs Abs: 0.9 10*3/uL (ref 0.7–4.0)
MCH: 29 pg (ref 26.0–34.0)
MCHC: 33.1 g/dL (ref 30.0–36.0)
MCV: 87.6 fL (ref 80.0–100.0)
Monocytes Absolute: 0.7 10*3/uL (ref 0.1–1.0)
Monocytes Relative: 11 %
Neutro Abs: 4.6 10*3/uL (ref 1.7–7.7)
Neutrophils Relative %: 72 %
Platelets: 311 10*3/uL (ref 150–400)
RBC: 4.1 MIL/uL — ABNORMAL LOW (ref 4.22–5.81)
RDW: 13.5 % (ref 11.5–15.5)
WBC: 6.4 10*3/uL (ref 4.0–10.5)
nRBC: 0 % (ref 0.0–0.2)

## 2020-05-10 LAB — BASIC METABOLIC PANEL
Anion gap: 10 (ref 5–15)
BUN: 18 mg/dL (ref 6–20)
CO2: 18 mmol/L — ABNORMAL LOW (ref 22–32)
Calcium: 8.6 mg/dL — ABNORMAL LOW (ref 8.9–10.3)
Chloride: 109 mmol/L (ref 98–111)
Creatinine, Ser: 0.94 mg/dL (ref 0.61–1.24)
GFR calc Af Amer: >60 mL/min (ref >60)
GFR calc non Af Amer: >60 mL/min (ref >60)
Glucose, Bld: 94 mg/dL (ref 70–99)
Potassium: 4 mmol/L (ref 3.5–5.1)
Sodium: 137 mmol/L (ref 135–145)

## 2020-05-10 LAB — MAGNESIUM: Magnesium: 2.1 mg/dL (ref 1.7–2.4)

## 2020-05-10 NOTE — Progress Notes (Signed)
PROGRESS NOTE  Lance Ray TXM:[id number] DOB: [birth year] DOA: 05/07/2020 PCP: Patient, No Pcp Per  HPI/Recap of past 24 hours:  HPI: Lance Ray is a 49 y.o. male with medical history significant for dementia, agitation and seizure who hospitalist service is being consulted for recurrent seizures.   Patient initally admitted on 6/30 under hospitalist service for generalized tonic clonic seizure due to non-compliance with Depakote. He was loaded with IV lorazepam and keppra and admitted to PCU. Had negative MRI brain and CT head. Hospital course was complicated by psychosis and agitation and required precedex gtt for agitation. Psychiatry was consulted and he was ultimately discharged to behavior health on 7/16 and was IVC'ed.  Hospitalist service being consulted by behavior health NP tonight after sitter heard patient scream and witnessed a generalized tonic-clonic seizure that lasted about 2 minutes. He was subsequently given 2mg  of Ativan IV after seizure subsided. Patient does not recall event leading up to seizure. Not too sure why he was originally hospitalized. He is able to tell me his name, year, city of the hospital is in and current president. At times he would just muttered unintelligible speech. Denies headache, blurred vision. No chest pain or shortness of breath.   Patient has been followed by neurology during this admission. He is on Lamtical 175mg  in the morning and 150mg  in the afternoon with plans to transition to 175mg  BID on 8/14. Also was getting Keppra 100mg  in the morning and 1500 in the afternoon with plans to decrease him down to 1000mg  BID on 8/14.   05/09/20: Seen and examined. Alert and oriented x 3. Calm, no complaints.  No acute events overnight.     Assessment/Plan: Principal Problem:   Seizure (HCC) Active Problems:   Psychosis (HCC)   Breakthrough seizures, likely secondary to medication noncompliance AEDs adjusted by neurology Currently on  Lamictal 1 7 5  mg twice daily, Klonopin 1 5 mg daily, Keppra 24hr tab 1500 mg twice daily Continue seizure precautions IV Ativan as needed for active seizures greater than 3 minutes as recommended by neurology Appreciate neurology's assistance  Seizure disorder Management per above Obtain AEDs levels  Agitation/behavioral disturbance IVC in place Admitted from behavioral health Continue psych medications Currently on Zyprexa Psych consult One-to-one sitter as needed    Code Status: Full code  Family Communication: None at bedside     Consultants:  Neurology  Procedures:  None  Antimicrobials:  None  DVT prophylaxis: Subcu Lovenox daily  Status is: Inpatient    Dispo: The patient is from: Behavioral health               Anticipated d/c is to: Behavioral health              Anticipated d/c date is: 05/11/2020              Patient currently stable for discharge        Objective: Vitals:   05/09/20 1957 05/10/20 0600 05/10/20 1141 05/10/20 1610  BP:  94/66 116/71 111/70  Pulse: 92 84 91 86  Resp:   16 14  Temp: 99.4 F (37.4 C) 98.1 F (36.7 C) 98.2 F (36.8 C) 98.8 F (37.1 C)  TempSrc: Oral Oral Oral Oral  SpO2: 100%  99% 98%  Weight:      Height:        Intake/Output Summary (Last 24 hours) at 05/10/2020 1705 Last data filed at 05/10/2020 1247 Gross per 24 hour  Intake 240 ml  Output 1650 ml  Net -1410 ml   Filed Weights   05/08/20 0000 05/08/20 0606  Weight: 65.2 kg 64.9 kg    Exam: No significant changes from prior exam.   General: 49 y.o. year-old male well developed well nourished in no acute distress.  Somnolent but easily arousable to voices.  Cardiovascular: Regular rate and rhythm with no rubs or gallops.  No thyromegaly or JVD noted.    Respiratory: Clear to auscultation with no wheezes or rales. Good inspiratory effort.  Abdomen: Soft nontender nondistended with normal bowel sounds x4 quadrants.  Musculoskeletal: No  lower extremity edema. 2/4 pulses in all 4 extremities.  Psychiatry: Mood is appropriate for condition and setting   Data Reviewed: CBC: Recent Labs  Lab 05/08/20 0636 05/10/20 0700  WBC 7.8 6.4  NEUTROABS  --  4.6  HGB 11.9* 11.9*  HCT 33.6* 35.9*  MCV 84.2 87.6  PLT 327 311   Basic Metabolic Panel: Recent Labs  Lab 05/08/20 0636 05/10/20 0700  NA 138 137  K 3.8 4.0  CL 108 109  CO2 23 18*  GLUCOSE 98 94  BUN 22* 18  CREATININE 0.99 0.94  CALCIUM 8.8* 8.6*  MG 2.2 2.1   GFR: Estimated Creatinine Clearance: 86.3 mL/min (by C-G formula based on SCr of 0.94 mg/dL). Liver Function Tests: Recent Labs  Lab 05/08/20 0636  AST 17  ALT 22  ALKPHOS 84  BILITOT 0.6  PROT 6.5  ALBUMIN 3.8   No results for input(s): LIPASE, AMYLASE in the last 168 hours. No results for input(s): AMMONIA in the last 168 hours. Coagulation Profile: No results for input(s): INR, PROTIME in the last 168 hours. Cardiac Enzymes: No results for input(s): CKTOTAL, CKMB, CKMBINDEX, TROPONINI in the last 168 hours. BNP (last 3 results) No results for input(s): PROBNP in the last 8760 hours. HbA1C: No results for input(s): HGBA1C in the last 72 hours. CBG: No results for input(s): GLUCAP in the last 168 hours. Lipid Profile: No results for input(s): CHOL, HDL, LDLCALC, TRIG, CHOLHDL, LDLDIRECT in the last 72 hours. Thyroid Function Tests: No results for input(s): TSH, T4TOTAL, FREET4, T3FREE, THYROIDAB in the last 72 hours. Anemia Panel: No results for input(s): VITAMINB12, FOLATE, FERRITIN, TIBC, IRON, RETICCTPCT in the last 72 hours. Urine analysis:    Component Value Date/Time   COLORURINE YELLOW (A) 05/27/2016 0200   APPEARANCEUR CLEAR (A) 05/27/2016 0200   LABSPEC 1.020 05/27/2016 0200   PHURINE 5.0 05/27/2016 0200   GLUCOSEU NEGATIVE 05/27/2016 0200   HGBUR NEGATIVE 05/27/2016 0200   BILIRUBINUR NEGATIVE 05/27/2016 0200   KETONESUR TRACE (A) 05/27/2016 0200   PROTEINUR 100  (A) 05/27/2016 0200   NITRITE NEGATIVE 05/27/2016 0200   LEUKOCYTESUR NEGATIVE 05/27/2016 0200   Sepsis Labs: @LABRCNTIP (procalcitonin:4,lacticidven:4)  )No results found for this or any previous visit (from the past 240 hour(s)).    Studies: No results found.  Scheduled Meds:  clonazepam  0.5 mg Oral Daily   enoxaparin (LOVENOX) injection  40 mg Subcutaneous Q24H   lamoTRIgine  175 mg Oral BID   levETIRAcetam  1,500 mg Oral BID   melatonin  5 mg Oral QHS   OLANZapine  5 mg Oral Daily   vitamin B-6  50 mg Oral Daily   thiamine  100 mg Oral Daily   vitamin B-12  1,000 mcg Oral Daily    Continuous Infusions:  sodium chloride 75 mL/hr (05/10/20 0736)     LOS: 3 days     05/12/20,  MD [facility] Pager [phone]  If 7PM-7AM, please contact night-coverage [email or web address] Password [facility] 05/10/2020, 5:05 PM

## 2020-05-10 NOTE — Plan of Care (Signed)
°  Problem: Clinical Measurements: Goal: Ability to maintain clinical measurements within normal limits will improve Outcome: Progressing   Problem: Activity: Goal: Risk for activity intolerance will decrease Outcome: Progressing   Problem: Safety: Goal: Ability to remain free from injury will improve Outcome: Progressing   Problem: Skin Integrity: Goal: Risk for impaired skin integrity will decrease Outcome: Progressing   Problem: Safety: Goal: Verbalization of understanding the information provided will improve Outcome: Progressing

## 2020-05-10 NOTE — Progress Notes (Signed)
Mobility Specialist - Progress Note   05/10/20 1433  Mobility  Activity Ambulated in room;Ambulated in hall  Level of Assistance Contact guard assist, steadying assist  Assistive Device None  Distance Ambulated (ft) 200 ft  Mobility Response Tolerated well  Mobility performed by Mobility specialist  $Mobility charge 1 Mobility    Pre-mobility: 91 HR, 107/65 BP, 100% SpO2 Post-mobility: 93 HR, 124/69 BP, 100% SpO2   Pt was in bed w/ sitter present in room. Pt agreed to session. Pt mod. Independent getting to EOB and S2S. Pt ambulated 200' total in room and hallway w/ CGA. Pt was heavy-footed w/ gait pattern, however, does not have LOB. Overall, pt tolerated session well. Pt left in bed w/ sitter present in room.    Tirso Laws Mobility Specialist  05/10/20, 2:36 PM

## 2020-05-10 NOTE — Discharge Summary (Signed)
Physician Discharge Summary Note  Patient:  Lance Ray is an 49 y.o., male MRN:  [id number] DOB:  [birth year] Patient phone:  [phone] (home)  Patient address:   [address] [place] [address],  Total Time spent with patient: 20 minutes  Date of Admission: 04/09/2020 Date of Discharge: 05/07/2020  Reason for Admission: Transferred to the psychiatric service because of concern about mental status changes related to his encephalopathy and seizures  Principal Problem: Seizure [facility]) Discharge Diagnoses: Principal Problem:   Seizure (HCC) Active Problems:   Psychosis (HCC)   Past Psychiatric History: Past history of chronic alcohol abuse and noncompliance with regimen of treatment for seizures  Past Medical History:  Past Medical History:  Diagnosis Date  . Seizures (HCC)     Past Surgical History:  Procedure Laterality Date  . SKIN GRAFT     Family History:  Family History  Problem Relation Age of Onset  . Seizures Mother   . Cerebral aneurysm Father    Family Psychiatric  History: None known Social History:  Social History   Substance and Sexual Activity  Alcohol Use Yes  . Alcohol/week: 2.0 standard drinks  . Types: 2 Shots of liquor per week   Comment: "rarely"      Social History   Substance and Sexual Activity  Drug Use No    Social History   Socioeconomic History  . Marital status: Single    Spouse name: Not on file  . Number of children: Not on file  . Years of education: Not on file  . Highest education level: Not on file  Occupational History  . Not on file  Tobacco Use  . Smoking status: Current Some Day Smoker  . Smokeless tobacco: Current User  Vaping Use  . Vaping Use: Never used  Substance and Sexual Activity  . Alcohol use: Yes    Alcohol/week: 2.0 standard drinks    Types: 2 Shots of liquor per week    Comment: "rarely"   . Drug use: No  . Sexual activity: Yes  Other Topics Concern  . Not on file  Social  History Narrative  . Not on file   Social Determinants of Health   Financial Resource Strain:   . Difficulty of Paying Living Expenses:   Food Insecurity:   . Worried About Programme researcher, broadcasting/film/video in the Last Year:   . Barista in the Last Year:   Transportation Needs:   . Freight forwarder (Medical):   [name] Lack of Transportation (Non-Medical):   Physical Activity:   . Days of Exercise per Week:   . Minutes of Exercise per Session:   Stress:   . Feeling of Stress :   Social Connections:   . Frequency of Communication with Friends and Family:   . Frequency of Social Gatherings with Friends and Family:   . Attends Religious Services:   . Active Member of Clubs or Organizations:   . Attends Banker Meetings:   [name] Marital Status:     Hospital Course: Patient was kept on the psychiatric ward where he required one-to-one monitoring because of instability on his feet.  Neurology consult was obtained.  They were very helpful in making sure that his antiseizure medicine was provided correctly.  Patient did have more than one observed seizure while on the psychiatry ward.  After 1 of these a rapid response was called and he was transferred to the medical service.  At no time was  the patient suicidal while he was in the hospital on the psychiatry ward.  He did have a couple of occasions of agitated aggression which were fairly easily controlled.  Physical Findings: AIMS:  , ,  ,  ,    CIWA:    COWS:     Musculoskeletal: Strength & Muscle Tone: decreased Gait & Station: unsteady Patient leans: N/A  Psychiatric Specialty Exam: Physical Exam  Review of Systems  Blood pressure 111/70, pulse 86, temperature 98.8 F (37.1 C), temperature source Oral, resp. rate 14, height 5\' 9"  (1.753 m), weight 64.9 kg, SpO2 98 %.Body mass index is 21.12 kg/m.  General Appearance: Disheveled  Eye Contact:  Minimal  Speech:  Garbled  Volume:  Decreased  Mood:  Dysphoric  Affect:   Constricted  Thought Process:  Disorganized  Orientation:  Negative  Thought Content:  Negative  Suicidal Thoughts:  No  Homicidal Thoughts:  No  Memory:  Negative  Judgement:  Negative  Insight:  Negative  Psychomotor Activity:  Negative  Concentration:  Concentration: Poor  Recall:  Poor  Fund of Knowledge:  Poor  Language:  Poor  Akathisia:  No  Handed:  Right  AIMS (if indicated):     Assets:  Others:  None in particular  ADL's:  Impaired  Cognition:  Impaired,  Moderate and Severe  Sleep:           Has this patient used any form of tobacco in the last 30 days? (Cigarettes, Smokeless Tobacco, Cigars, and/or Pipes) Yes, Yes, A prescription for an FDA-approved tobacco cessation medication was offered at discharge and the patient refused  Blood Alcohol level:  Lab Results  Component Value Date   ETH <10 11/03/2019   ETH <5 12/05/2016    Metabolic Disorder Labs:  No results found for: HGBA1C, MPG No results found for: PROLACTIN No results found for: CHOL, TRIG, HDL, CHOLHDL, VLDL, LDLCALC  See Psychiatric Specialty Exam and Suicide Risk Assessment completed by Attending Physician prior to discharge.  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  No indication to return him to the psychiatric unit at this time.  Comments: Patient was discharged to the medical unit after hours  Signed: 12/07/2016, MD 05/10/2020,  5:08 PM

## 2020-05-10 NOTE — Evaluation (Addendum)
Physical Therapy Evaluation Patient Details Name: Lance Ray MRN: [id number] DOB: 1970/11/09 Today's Date: 05/10/2020   History of Present Illness  Lance Ray was admitted to medical floor from behavioral health unit after witnessed having a generalized tonic-clonic seizure x 2 minutes. Pt was given 2mg  of IV Ativan afterward. Pt was previously admitted to behavior health unit on 7/16 after being on medical unit for seizures x 2 weeks. Pt with recent diagnosis of dementia. PMH includes epilepsy and depression.  Clinical Impression  Pt asleep in bed upon arrival to room with 1:1 sitter present. Pt awoke with verbal cues and agreeable to PT evaluation. Pt with good strength in BUE/BLE and WFL for tasks assessed today. Pt ambulated 210 feet with CGA for safety and steadying in hallway with no AD, ambulating 10 feet in 5 seconds. Pt presents with decreased safety awareness and decreased awareness of deficits however was able to follow single step commands 100% of the time and 75% of multi-step commands. Pt is known to this 8/16 from previous admissions. Recommend no skilled PT during acute stay and anticipate pt will be discharged back to [facility]. Continuing to recommend mobility with mobility techs. Pt would benefit from d/c to long term care as pt with decreased caregiver assistance at home and requires 24/7 supervision for safety and for mobility. Will d/c from caseload. Please reconsult if pt's status changes.    Follow Up Recommendations Supervision/Assistance - 24 hour (long term care)    Equipment Recommendations  None recommended by PT    Recommendations for Other Services       Precautions / Restrictions Precautions Precautions: Fall Precaution Comments: high fall risk Restrictions Weight Bearing Restrictions: No      Mobility  Bed Mobility Overal bed mobility: Needs Assistance Bed Mobility: Supine to Sit     Supine to sit: Supervision      General bed mobility comments: Supervision required for safety however pt required no physical assistance  Transfers Overall transfer level: Needs assistance Equipment used: None Transfers: Sit to/from Stand Sit to Stand: Min guard         General transfer comment: CGA for safety during sit to stand transfer with supervision for stand to sit.  Ambulation/Gait Ambulation/Gait assistance: Min guard Gait Distance (Feet): 210 Feet Assistive device: None Gait Pattern/deviations: Narrow base of support Gait velocity: 10' in 5 seconds   General Gait Details: Pt ambulated with reciprocal pattern bilaterally however presents with narrow BOS. Pt with decreased control of speed during placement of feet on floor hitting hard with bilateral heels.   Stairs            Wheelchair Mobility    Modified Rankin (Stroke Patients Only)       Balance Overall balance assessment: Needs assistance Sitting-balance support: No upper extremity supported;Feet supported Sitting balance-Leahy Scale: Good Sitting balance - Comments: supervision for safety with no overt LOB noted while sitting EOB without back support   Standing balance support: No upper extremity supported Standing balance-Leahy Scale: Good Standing balance comment: pt required CGA for dynamic balance and SBA for static standing with BLE resting on recliner chair                             Pertinent Vitals/Pain Pain Assessment: No/denies pain    Home Living Family/patient expects to be discharged to:: Unsure Living Arrangements: Alone Available Help at Discharge: Family;Friend(s);Available PRN/intermittently Type of Home: House Home  Access: Stairs to enter   [employer] of Steps: 1 Home Layout: One level   Additional Comments: PLOF and home setup information primarily gathered from previous documentation.     Prior Function Level of Independence: Independent         Comments: According to  previous documentation, pt reporting independence at prior level, does not drive, and walks to local store from home.     Hand Dominance   Dominant Hand: Right    Extremity/Trunk Assessment   Upper Extremity Assessment Upper Extremity Assessment: Overall WFL for tasks assessed (grossly 4 to 4+/5 bilaterally)    Lower Extremity Assessment Lower Extremity Assessment: Overall WFL for tasks assessed (grossly 4/5 bilaterally)       Communication   Communication: Other (comment) (at times, pt with unintelligible speech)  Cognition Arousal/Alertness: Awake/alert (was asleep but awoke with no difficulties or agitation) Behavior During Therapy: WFL for tasks assessed/performed Overall Cognitive Status: History of cognitive impairments - at baseline Area of Impairment: Problem solving;Safety/judgement;Awareness;Orientation;Memory                 Orientation Level: Disoriented to;Situation Current Attention Level: Focused Memory: Decreased recall of precautions;Decreased short-term memory Following Commands: Follows one step commands consistently;Follows multi-step commands consistently;Follows multi-step commands with increased time Safety/Judgement: Decreased awareness of safety;Decreased awareness of deficits Awareness: Intellectual Problem Solving: Slow processing;Requires verbal cues General Comments: pt unaware of situation that made him change rooms      General Comments      Exercises Other Exercises Other Exercises: pt performed 5xSTS in 17.67 seconds and performed with SBA for safety; pt noted to press back on knees on recliner chair for additional balance   Assessment/Plan    PT Assessment Patent does not need any further PT services  PT Problem List Decreased strength;Decreased balance;Decreased safety awareness;Decreased knowledge of precautions       PT Treatment Interventions      PT Goals (Current goals can be found in the Care Plan section)  Acute  Rehab PT Goals Patient Stated Goal: none stated PT Goal Formulation: Patient unable to participate in goal setting Time For Goal Achievement: 05/10/20 Potential to Achieve Goals: Good    Frequency  (once)   Barriers to discharge Decreased caregiver support      Co-evaluation               AM-PAC PT "6 Clicks" Mobility  Outcome Measure Help needed turning from your back to your side while in a flat bed without using bedrails?: None Help needed moving from lying on your back to sitting on the side of a flat bed without using bedrails?: None Help needed moving to and from a bed to a chair (including a wheelchair)?: A Little Help needed standing up from a chair using your arms (e.g., wheelchair or bedside chair)?: A Little Help needed to walk in hospital room?: A Little Help needed climbing 3-5 steps with a railing? : A Little 6 Click Score: 20    End of Session Equipment Utilized During Treatment: Gait belt Activity Tolerance: Patient tolerated treatment well Patient left: in chair;with call bell/phone within reach;with chair alarm set;with nursing/sitter in room Nurse Communication: Mobility status PT Visit Diagnosis: Unsteadiness on feet (R26.81);Other abnormalities of gait and mobility (R26.89);Muscle weakness (generalized) (M62.81)    Time: 4315-4008 PT Time Calculation (min) (ACUTE ONLY): 27 min   Charges:   PT Evaluation $PT Eval Moderate Complexity: 1 Mod PT Treatments $Gait Training: 8-22 mins  Frederich Chick, SPT  Jonathen Rathman 05/10/2020, 1:57 PM

## 2020-05-10 NOTE — Plan of Care (Signed)

## 2020-05-11 DIAGNOSIS — R569 Unspecified convulsions: Secondary | ICD-10-CM

## 2020-05-11 MED ORDER — CLONAZEPAM 0.25 MG PO TBDP
0.5000 mg | ORAL_TABLET | Freq: Two times a day (BID) | ORAL | Status: DC
  Administered 2020-05-11 – 2020-06-15 (×69): 0.5 mg via ORAL
  Filled 2020-05-11 (×71): qty 2

## 2020-05-11 MED ORDER — OLANZAPINE 5 MG PO TABS
5.0000 mg | ORAL_TABLET | Freq: Every day | ORAL | Status: DC
  Administered 2020-05-12 – 2020-08-11 (×92): 5 mg via ORAL
  Filled 2020-05-11 (×93): qty 1

## 2020-05-11 MED ORDER — HALOPERIDOL 2 MG PO TABS
2.0000 mg | ORAL_TABLET | Freq: Two times a day (BID) | ORAL | Status: DC
  Administered 2020-05-11 – 2020-06-12 (×65): 2 mg via ORAL
  Filled 2020-05-11 (×66): qty 1

## 2020-05-11 NOTE — Consult Note (Signed)
Reason for Consult: hx of seizures Requesting Physician: Dr. Margo Aye   CC: hx of seizures    HPI: Lance Ray is an 49 y.o. male with medical history significant forseizure disorder, chronic alcohol abuse, noncompliance with regimen of treatment of his seizures, who was admitted from behavioral health due to recurrent seizures.    Patient initally admitted on 03/24/20 under hospitalist service for generalized tonic clonic seizuredue to non-compliance with Depakote.He was loaded with IV keppra and admitted to PCU.Had negative MRI brain and CT head. Hospital course was complicated by psychosis and agitation and required precedex gtt for agitation. Psychiatry wasconsulted and he wasultimatelydischarged to behavior health on 04/09/20 and was IVC'ed.  Hospitalist service was consultedby behavior health NPaftersitter heard patient scream and witnessed a generalized tonic-clonic seizurethatlastedabout 2 minutes.  He was given 2 mg of Ativan IV and seen by neurology.    Currently on Lamictal 175 mg twice daily, Klonopin 0.5 mg daily, Keppra 1500 mg twice daily.  Past Medical History:  Diagnosis Date  . Seizures (HCC)     Past Surgical History:  Procedure Laterality Date  . SKIN GRAFT      Family History  Problem Relation Age of Onset  . Seizures Mother   . Cerebral aneurysm Father     Social History:  reports that he has been smoking. He uses smokeless tobacco. He reports current alcohol use of about 2.0 standard drinks of alcohol per week. He reports that he does not use drugs.  No Known Allergies  Medications: I have reviewed the patient's current medications.   Physical Examination: Blood pressure 113/71, pulse 90, temperature 98.7 F (37.1 C), temperature source Oral, resp. rate 20, height 5\' 9"  (1.753 m), weight 64.9 kg, SpO2 98 %.    Neurological Examination   Mental Status: Alert, oriented, thought content appropriate.  Speech fluent without evidence of  aphasia.  Able to follow 3 step commands without difficulty. Cranial Nerves: II: Discs flat bilaterally; Visual fields grossly normal, pupils equal, round, reactive to light and accommodation III,IV, VI: ptosis not present, extra-ocular motions intact bilaterally V,VII: smile symmetric, facial light touch sensation normal bilaterally VIII: hearing normal bilaterally IX,X: gag reflex present XI: bilateral shoulder shrug XII: midline tongue extension Motor: Right : Upper extremity   5/5    Left:     Upper extremity   5/5  Lower extremity   5/5     Lower extremity   5/5 Tone and bulk:normal tone throughout; no atrophy noted Sensory: Pinprick and light touch intact throughout, bilaterally Deep Tendon Reflexes: 2+ and symmetric throughout     Laboratory Studies:   Basic Metabolic Panel: Recent Labs  Lab 05/08/20 0636 05/10/20 0700  NA 138 137  K 3.8 4.0  CL 108 109  CO2 23 18*  GLUCOSE 98 94  BUN 22* 18  CREATININE 0.99 0.94  CALCIUM 8.8* 8.6*  MG 2.2 2.1    Liver Function Tests: Recent Labs  Lab 05/08/20 0636  AST 17  ALT 22  ALKPHOS 84  BILITOT 0.6  PROT 6.5  ALBUMIN 3.8   No results for input(s): LIPASE, AMYLASE in the last 168 hours. No results for input(s): AMMONIA in the last 168 hours.  CBC: Recent Labs  Lab 05/08/20 0636 05/10/20 0700  WBC 7.8 6.4  NEUTROABS  --  4.6  HGB 11.9* 11.9*  HCT 33.6* 35.9*  MCV 84.2 87.6  PLT 327 311    Cardiac Enzymes: No results for input(s): CKTOTAL, CKMB, CKMBINDEX, TROPONINI in  the last 168 hours.  BNP: Invalid input(s): POCBNP  CBG: No results for input(s): GLUCAP in the last 168 hours.  Microbiology: Results for orders placed or performed during the hospital encounter of 03/24/20  SARS Coronavirus 2 by RT PCR (hospital order, performed in [facility] hospital lab) Nasopharyngeal Nasopharyngeal Swab     Status: None   Collection Time: 03/25/20  4:13 PM   Specimen: Nasopharyngeal Swab  Result Value Ref  Range Status   SARS Coronavirus 2 NEGATIVE NEGATIVE Final    Comment: (NOTE) SARS-CoV-2 target nucleic acids are NOT DETECTED.  The SARS-CoV-2 RNA is generally detectable in upper and lower respiratory specimens during the acute phase of infection. The lowest concentration of SARS-CoV-2 viral copies this assay can detect is 250 copies / mL. A negative result does not preclude SARS-CoV-2 infection and should not be used as the sole basis for treatment or other patient management decisions.  A negative result may occur with improper specimen collection / handling, submission of specimen other than nasopharyngeal swab, presence of viral mutation(s) within the areas targeted by this assay, and inadequate number of viral copies (<250 copies / mL). A negative result must be combined with clinical observations, patient history, and epidemiological information.  Fact Sheet for Patients:   BoilerBrush.com.cy  Fact Sheet for Healthcare Providers: https://pope.com/  This test is not yet approved or  cleared by the [place] FDA and has been authorized for detection and/or diagnosis of SARS-CoV-2 by FDA under an Emergency Use Authorization (EUA).  This EUA will remain in effect (meaning this test can be used) for the duration of the COVID-19 declaration under Section 564(b)(1) of the Act, 21 U.S.C. section 360bbb-3(b)(1), unless the authorization is terminated or revoked sooner.  Performed at [facility], [address] [place], [place] [address]   Culture, blood (Routine X 2) w Reflex to ID Panel     Status: None   Collection Time: 03/31/20 10:34 AM   Specimen: BLOOD  Result Value Ref Range Status   Specimen Description BLOOD BLOOD RIGHT ARM  Final   Special Requests   Final    BOTTLES DRAWN AEROBIC ONLY Blood Culture results may not be optimal due to an inadequate volume of blood received in culture bottles   Culture    Final    NO GROWTH 5 DAYS Performed at [facility], [address], [place], [place] [address]    Report Status 04/05/2020 FINAL  Final  Culture, blood (Routine X 2) w Reflex to ID Panel     Status: None   Collection Time: 03/31/20 10:41 AM   Specimen: BLOOD  Result Value Ref Range Status   Specimen Description BLOOD LEFT ANTECUBITAL  Final   Special Requests   Final    BOTTLES DRAWN AEROBIC AND ANAEROBIC Blood Culture adequate volume   Culture   Final    NO GROWTH 5 DAYS Performed at [facility], [address], [place], [place] [address]    Report Status 04/05/2020 FINAL  Final    Coagulation Studies: No results for input(s): LABPROT, INR in the last 72 hours.  Urinalysis: No results for input(s): COLORURINE, LABSPEC, PHURINE, GLUCOSEU, HGBUR, BILIRUBINUR, KETONESUR, PROTEINUR, UROBILINOGEN, NITRITE, LEUKOCYTESUR in the last 168 hours.  Invalid input(s): APPERANCEUR  Lipid Panel:  No results found for: CHOL, TRIG, HDL, CHOLHDL, VLDL, LDLCALC  HgbA1C: No results found for: HGBA1C  Urine Drug Screen:      Component Value Date/Time   LABOPIA NONE DETECTED 05/27/2016  0200   COCAINSCRNUR NONE DETECTED 05/27/2016 0200   LABBENZ NONE DETECTED 05/27/2016 0200   AMPHETMU NONE DETECTED 05/27/2016 0200   THCU NONE DETECTED 05/27/2016 0200   LABBARB NONE DETECTED 05/27/2016 0200    Alcohol Level: No results for input(s): ETH in the last 168 hours.    Imaging: No results found.   Assessment/Plan:  No reported recent seizures as per patient and caregiver at bedside.   - as per patient and on examination no periods of agitation, confusion, disorientation.  - given that he is not having side effects I would leave him on current doses of anti epileptics that he can be d/c on.  Lamictal 175 BID and Keppra 1500 BID 05/11/2020, 12:15 PM

## 2020-05-11 NOTE — Progress Notes (Signed)
PROGRESS NOTE  Lance Ray OXB:[id number] DOB: 1971-07-01 DOA: 05/07/2020 PCP: Patient, No Pcp Per  HPI/Recap of past 24 hours:  HPI: Lance Ray is a 49 y.o. male with medical history significant for seizure disorder, chronic alcohol abuse, noncompliance with regimen of treatment of his seizures, who was admitted from behavioral health due to recurrent seizures.    Patient initally admitted on 03/24/20 under hospitalist service for generalized tonic clonic seizure due to non-compliance with Depakote. He was loaded with IV keppra and admitted to PCU. Had negative MRI brain and CT head. Hospital course was complicated by psychosis and agitation and required precedex gtt for agitation. Psychiatry was consulted and he was ultimately discharged to behavior health on 04/09/20 and was IVC'ed.  Hospitalist service was consulted by behavior health NP after sitter heard patient scream and witnessed a generalized tonic-clonic seizure that lasted about 2 minutes.  He was given 2 mg of Ativan IV and seen by neurology.    Currently on Lamictal 175 mg twice daily, Klonopin 0.5 mg daily, Keppra 1500 mg twice daily.   05/10/20: Seen and examined.  He is alert and oriented x4, responds to all questions appropriately.  Calm with no complaints.  Will obtain level of Keppra and Lamictal.  Neurology will reassess prior to DC.  Patient will benefit from home health RN to assist with management of his seizure medications.  TOC has been consulted to assist with home health services.    Patient denies any suicidal or homicidal ideation.   Assessment/Plan: Principal Problem:   Seizure (HCC) Active Problems:   Psychosis (HCC)   Breakthrough seizures, likely secondary to AEDs noncompliance AEDs adjusted by neurology Currently on Lamictal 175 mg twice daily, Klonopin 0.5 mg daily, Keppra 1500 mg twice daily. Continue seizure precautions IV Ativan as needed for active seizures greater than 3 minutes as  recommended by neurology Appreciate neurology's assistance, Dr. Loretha Brasil will see today for dc planning recommendations Patient will benefit from home health RN to assist with management of his seizure medications.  TOC has been consulted to assist with home health services.    Seizure disorder Management per above Obtaining Lamictal and Keppra levels, follow results.  Resolved agitation suspect related to alcohol withdrawal Admitted from behavioral health No evidence of alcohol withdrawal at the time of this visit. Continue psych medications, multivitamins, thiamine and folic acid. Seen by psych no need for inpatient psych   Code Status: Full code  Family Communication: None at bedside     Consultants:  Neurology  Psychiatry  Procedures:  None  Antimicrobials:  None  DVT prophylaxis: Subcu Lovenox daily  Status is: Inpatient    Dispo: The patient is from: Behavioral health               Anticipated d/c is to: Home with home health services              Anticipated d/c date is: 05/12/2020              Patient currently stable for discharge, awaiting neurology reassessment and home health services arrangement.       Objective: Vitals:   05/10/20 2053 05/11/20 0530 05/11/20 0757 05/11/20 1141  BP: 105/64 115/70 116/74 113/71  Pulse: 90 90 82 90  Resp: 16  20 20   Temp: 98.6 F (37 C) 98.6 F (37 C) 97.9 F (36.6 C) 98.7 F (37.1 C)  TempSrc:  Oral Oral Oral  SpO2: 99% 99% 100% 98%  Weight:  Height:        Intake/Output Summary (Last 24 hours) at 05/11/2020 1145 Last data filed at 05/11/2020 0950 Gross per 24 hour  Intake 3566.18 ml  Output 0 ml  Net 3566.18 ml   Filed Weights   05/08/20 0000 05/08/20 0606  Weight: 65.2 kg 64.9 kg    Exam:  . General: 49 y.o. year-old male well-developed well-nourished no acute distress.  Alert and oriented x4.   . Cardiovascular: Regular rate and rhythm no rubs or gallops. [name] Respiratory: Clear to  auscultation no wheezes or rales.   . Abdomen: Soft nontender normal bowel sounds present . Musculoskeletal: No lower extremity edema bilaterally.   [name] Psychiatry: Mood is appropriate for condition and setting   Data Reviewed: CBC: Recent Labs  Lab 05/08/20 0636 05/10/20 0700  WBC 7.8 6.4  NEUTROABS  --  4.6  HGB 11.9* 11.9*  HCT 33.6* 35.9*  MCV 84.2 87.6  PLT 327 311   Basic Metabolic Panel: Recent Labs  Lab 05/08/20 0636 05/10/20 0700  NA 138 137  K 3.8 4.0  CL 108 109  CO2 23 18*  GLUCOSE 98 94  BUN 22* 18  CREATININE 0.99 0.94  CALCIUM 8.8* 8.6*  MG 2.2 2.1   GFR: Estimated Creatinine Clearance: 86.3 mL/min (by C-G formula based on SCr of 0.94 mg/dL). Liver Function Tests: Recent Labs  Lab 05/08/20 0636  AST 17  ALT 22  ALKPHOS 84  BILITOT 0.6  PROT 6.5  ALBUMIN 3.8   No results for input(s): LIPASE, AMYLASE in the last 168 hours. No results for input(s): AMMONIA in the last 168 hours. Coagulation Profile: No results for input(s): INR, PROTIME in the last 168 hours. Cardiac Enzymes: No results for input(s): CKTOTAL, CKMB, CKMBINDEX, TROPONINI in the last 168 hours. BNP (last 3 results) No results for input(s): PROBNP in the last 8760 hours. HbA1C: No results for input(s): HGBA1C in the last 72 hours. CBG: No results for input(s): GLUCAP in the last 168 hours. Lipid Profile: No results for input(s): CHOL, HDL, LDLCALC, TRIG, CHOLHDL, LDLDIRECT in the last 72 hours. Thyroid Function Tests: No results for input(s): TSH, T4TOTAL, FREET4, T3FREE, THYROIDAB in the last 72 hours. Anemia Panel: No results for input(s): VITAMINB12, FOLATE, FERRITIN, TIBC, IRON, RETICCTPCT in the last 72 hours. Urine analysis:    Component Value Date/Time   COLORURINE YELLOW (A) 05/27/2016 0200   APPEARANCEUR CLEAR (A) 05/27/2016 0200   LABSPEC 1.020 05/27/2016 0200   PHURINE 5.0 05/27/2016 0200   GLUCOSEU NEGATIVE 05/27/2016 0200   HGBUR NEGATIVE 05/27/2016 0200    BILIRUBINUR NEGATIVE 05/27/2016 0200   KETONESUR TRACE (A) 05/27/2016 0200   PROTEINUR 100 (A) 05/27/2016 0200   NITRITE NEGATIVE 05/27/2016 0200   LEUKOCYTESUR NEGATIVE 05/27/2016 0200   Sepsis Labs: @LABRCNTIP (procalcitonin:4,lacticidven:4)  )No results found for this or any previous visit (from the past 240 hour(s)).    Studies: No results found.  Scheduled Meds: . clonazepam  0.5 mg Oral Daily  . enoxaparin (LOVENOX) injection  40 mg Subcutaneous Q24H  . lamoTRIgine  175 mg Oral BID  . levETIRAcetam  1,500 mg Oral BID  . melatonin  5 mg Oral QHS  . OLANZapine  5 mg Oral Daily  . vitamin B-6  50 mg Oral Daily  . thiamine  100 mg Oral Daily  . vitamin B-12  1,000 mcg Oral Daily    Continuous Infusions:    LOS: 4 days     , MD [facility]  Pager [phone]  If 7PM-7AM, please contact night-coverage [email or web address] Password [email or web address] 05/11/2020, 11:45 AM

## 2020-05-11 NOTE — Progress Notes (Signed)
Patient ID: Lance Ray, male   DOB: [birth year], 49 y.o.   MRN: [id number]   Very brief Progress note  More pending  Patient well known to Psych  Transferred from Psych to Medicine with reports and symptoms of seizures --now medically clear  Very complex situation where he is has been on psychiatry after very long stay awaiting NH placement   In the meantime developed seizures and is now medically clear   At this point he does not need Psych admission but needs to proceed to NH placement  Many meetings with sisters in the past where they are obtaining guardianship and he has a hearing Sept 9 for incompetency  Patient today remains in the same state  He does not meet capacity for consent for multiple reasons noted and outlined many times before.   He does not understand the full implications of his illnesses and what would happen if not treated  He lacks capacity to care for Self at home.   His judgement insight and reliability are chronically impaired   His ADL's needs NH level assistance   He is a fall risk and ----and is not safe at home   He cannot take care and manage his affairs  His house is not cared for and he lives in a disheveled state   Will write ---further in am

## 2020-05-11 NOTE — Progress Notes (Signed)
Mobility Specialist - Progress Note   05/11/20 1216  Mobility  Activity Ambulated in room;Ambulated in hall  Level of Assistance Standby assist, set-up cues, supervision of patient - no hands on (CGA for safety precautions)  Assistive Device None  Distance Ambulated (ft) 220 ft  Mobility Response Tolerated well  Mobility performed by Mobility specialist  $Mobility charge 1 Mobility    Pre-mobility: 96 HR, 113/72 BP, 100% SpO2 Post-mobility: 87 HR, 125/74 BP, 100% SpO2   Pt was in bed w/ sitter present in room upon arrival. Pt agreed to mobility session. Pt independent w/ bed mobility and getting to EOB. Pt ambulated 220' total in room and hallway w/ SBA. CGA utilized for safety precautions. Overall, pt tolerated session very well. Pt left in bed w/ sitter present in room.    Brandonn Capelli Mobility Specialist  05/11/20, 12:20 PM

## 2020-05-12 LAB — COMPREHENSIVE METABOLIC PANEL
ALT: 21 U/L (ref 0–44)
AST: 15 U/L (ref 15–41)
Albumin: 3.9 g/dL (ref 3.5–5.0)
Alkaline Phosphatase: 84 U/L (ref 38–126)
Anion gap: 9 (ref 5–15)
BUN: 18 mg/dL (ref 6–20)
CO2: 23 mmol/L (ref 22–32)
Calcium: 9 mg/dL (ref 8.9–10.3)
Chloride: 106 mmol/L (ref 98–111)
Creatinine, Ser: 0.99 mg/dL (ref 0.61–1.24)
GFR calc Af Amer: >60 mL/min (ref >60)
GFR calc non Af Amer: >60 mL/min (ref >60)
Glucose, Bld: 96 mg/dL (ref 70–99)
Potassium: 4.1 mmol/L (ref 3.5–5.1)
Sodium: 138 mmol/L (ref 135–145)
Total Bilirubin: 0.5 mg/dL (ref 0.3–1.2)
Total Protein: 6.7 g/dL (ref 6.5–8.1)

## 2020-05-12 LAB — CBC WITH DIFFERENTIAL/PLATELET
Abs Immature Granulocytes: 0.03 10*3/uL (ref 0.00–0.07)
Basophils Absolute: 0 10*3/uL (ref 0.0–0.1)
Basophils Relative: 0 %
Eosinophils Absolute: 0.4 10*3/uL (ref 0.0–0.5)
Eosinophils Relative: 5 %
HCT: 36.5 % — ABNORMAL LOW (ref 39.0–52.0)
Hemoglobin: 12.5 g/dL — ABNORMAL LOW (ref 13.0–17.0)
Immature Granulocytes: 0 %
Lymphocytes Relative: 14 %
Lymphs Abs: 1 10*3/uL (ref 0.7–4.0)
MCH: 29 pg (ref 26.0–34.0)
MCHC: 34.2 g/dL (ref 30.0–36.0)
MCV: 84.7 fL (ref 80.0–100.0)
Monocytes Absolute: 0.8 10*3/uL (ref 0.1–1.0)
Monocytes Relative: 11 %
Neutro Abs: 4.9 10*3/uL (ref 1.7–7.7)
Neutrophils Relative %: 70 %
Platelets: 338 10*3/uL (ref 150–400)
RBC: 4.31 MIL/uL (ref 4.22–5.81)
RDW: 13.7 % (ref 11.5–15.5)
WBC: 7.1 10*3/uL (ref 4.0–10.5)
nRBC: 0 % (ref 0.0–0.2)

## 2020-05-12 LAB — PHOSPHORUS: Phosphorus: 4.1 mg/dL (ref 2.5–4.6)

## 2020-05-12 LAB — LAMOTRIGINE LEVEL: Lamotrigine Lvl: 4.3 ug/mL (ref 2.0–20.0)

## 2020-05-12 LAB — MAGNESIUM: Magnesium: 2 mg/dL (ref 1.7–2.4)

## 2020-05-12 NOTE — TOC Progression Note (Signed)
Transition of Care [facility]) - Progression Note    Patient Details  Name: Lance Ray MRN: [id number] Date of Birth: 1970-12-17  Transition of Care [facility]) CM/SW Contact  Shawn Route, RN Phone Number: 05/12/2020, 3:06 PM  Clinical Narrative:     Spoke with Patient's Sister who has filed for guardianship, hearing is scheduled for September 9th, 2021 at 11am.  Patient has a Guardian appointed: Lorita Officer [phone].  I have left a message for Onalee Hua.    Sister has tried but unable to complete processes without guardianship:  Medicaid application and disability forms.  She also cannot access his financials to pay his bills.  She is unsure how much money he has I the bank, but it is minimal.    Michaela, SW said plan was to try [place] nursing.  I made call to Lynnea Ferrier at [facility] explained predicament.  She stated the facility is not currently accepting patients, but he would not be accepted without an available payer source.          Expected Discharge Plan and Services                                                 Social Determinants of Health (SDOH) Interventions    Readmission Risk Interventions Readmission Risk Prevention Plan 03/29/2020  Transportation Screening Complete  HRI or Home Care Consult Complete  Social Work Consult for Recovery Care Planning/Counseling Complete  Palliative Care Screening Not Applicable  Medication Review [facility]) Referral to Pharmacy  Some recent data might be hidden

## 2020-05-12 NOTE — Progress Notes (Signed)
Mobility Specialist - Progress Note   05/12/20 1248  Mobility  Activity Ambulated in room;Ambulated in hall  Level of Assistance Standby assist, set-up cues, supervision of patient - no hands on (CGA for safety precautions)  Assistive Device None  Distance Ambulated (ft) 350 ft  Mobility Response Tolerated well  Mobility performed by Mobility specialist  $Mobility charge 1 Mobility    Pre-mobility: 95 HR, 98% SpO2 Post-mobility: 93 HR, 98% SpO2   Pt was in bed upon arrival. Pt agreed to mobility session. Pt SBA throughout entire session. Pt ambulated 350' in room and hallway. CGA utilized for safety precautions. Overall, pt tolerated session well. Pt left in bed w/ call bell and phone placed in reach.   Artesia Berkey Mobility Specialist  05/12/20, 12:54 PM

## 2020-05-12 NOTE — Plan of Care (Signed)
Patient presently resting in the bed, remains alert and oriented, denies any pain, vss mood calm, no seizure activity this shift , no acute distress noted   Problem: Safety: Goal: Ability to remain free from injury will improve Outcome: Progressing   Problem: Skin Integrity: Goal: Risk for impaired skin integrity will decrease Outcome: Progressing   Problem: Medication: Goal: Risk for medication side effects will decrease Outcome: Progressing

## 2020-05-12 NOTE — NC FL2 (Signed)
[place] MEDICAID FL2 LEVEL OF CARE SCREENING TOOL     IDENTIFICATION  Patient Name: Lance Ray Birthdate: 06/08/1971 Sex: male Admission Date (Current Location): 05/07/2020  [place] and [place] Number:  Chiropodist and Address:  [facility], [address] [place], [place] [address]      Provider Number: [id number]  Attending Physician Name and Address:  Gertha Calkin, MD  Relative Name and Phone Number:  Ephriam Jenkins (Sister) [phone]    Current Level of Care: Hospital Recommended Level of Care: Nursing Facility Prior Approval Number:    Date Approved/Denied:   PASRR Number:    Discharge Plan:  (Long Term Care)    Current Diagnoses: Patient Active Problem List   Diagnosis Date Noted  . Seizure (HCC) 05/07/2020  . 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 Stuttering   . Depression   . Psychosis (HCC) 04/09/2020  . Failure to thrive in adult 04/09/2020  . Dementia (HCC) 04/09/2020  . Seizure disorder (HCC) 03/25/2020  . Recurrent seizures (HCC) 03/24/2020    Orientation RESPIRATION BLADDER Height & Weight     Time, Situation  Normal Continent Weight: 65.5 kg Height:  5\' 9"  (175.3 cm)  BEHAVIORAL SYMPTOMS/MOOD NEUROLOGICAL BOWEL NUTRITION STATUS  Wanderer   Continent Diet (REgular)  AMBULATORY STATUS COMMUNICATION OF NEEDS Skin   Supervision Verbally Normal                       Personal Care Assistance Level of Assistance  Bathing Bathing Assistance: Limited assistance         Functional Limitations Info             SPECIAL CARE FACTORS FREQUENCY                       Contractures Contractures Info: Not present    Additional Factors Info  Code Status, Allergies Code Status Info: Full Code Allergies Info: NKDa           Current Medications (05/12/2020):  This is the current hospital active medication list Current Facility-Administered Medications  Medication Dose Route  Frequency Provider Last Rate Last Admin  . clonazePAM (KLONOPIN) disintegrating tablet 0.5 mg  0.5 mg Oral BID 05/14/2020, MD   0.5 mg at 05/12/20 0930  . enoxaparin (LOVENOX) injection 40 mg  40 mg Subcutaneous Q24H Tu, Ching T, DO   40 mg at 05/12/20 0932  . haloperidol (HALDOL) tablet 2 mg  2 mg Oral BID 05/14/20, MD   2 mg at 05/12/20 0932  . lamoTRIgine (LAMICTAL) tablet 175 mg  175 mg Oral BID Tu, Ching T, DO   175 mg at 05/12/20 0931  . levETIRAcetam (KEPPRA XR) 24 hr tablet 1,500 mg  1,500 mg Oral BID Tu, Ching T, DO   1,500 mg at 05/12/20 0931  . LORazepam (ATIVAN) injection 2 mg  2 mg Intravenous Q4H PRN Tu, Ching T, DO      . melatonin tablet 5 mg  5 mg Oral QHS Tu, Ching T, DO   5 mg at 05/11/20 2145  . OLANZapine (ZYPREXA) tablet 5 mg  5 mg Oral QHS 2146, MD      . pyridOXINE (VITAMIN B-6) tablet 50 mg  50 mg Oral Daily Tu, Ching T, DO   50 mg at 05/12/20 0931  . thiamine tablet 100 mg  100 mg Oral Daily Tu, Ching T, DO   100  mg at 05/12/20 0930  . vitamin B-12 (CYANOCOBALAMIN) tablet 1,000 mcg  1,000 mcg Oral Daily Tu, Ching T, DO   1,000 mcg at 05/12/20 0930     Discharge Medications: Please see discharge summary for a list of discharge medications.  Relevant Imaging Results:  Relevant Lab Results:   Additional Information    Shawn Route, RN

## 2020-05-12 NOTE — Progress Notes (Signed)
PROGRESS NOTE  LONG BRIMAGE ERX:[id number] DOB: 06/08/1971 DOA: 05/07/2020 PCP: Patient, No Pcp Per  HPI/Recap of past 24 hours:  HPI: Lance Ray is a 49 y.o. male with medical history significant for seizure disorder, chronic alcohol abuse, noncompliance with regimen of treatment of his seizures, who was admitted from behavioral health due to recurrent seizures.    Patient initally admitted on 03/24/20 under hospitalist service for generalized tonic clonic seizure due to non-compliance with Depakote. He was loaded with IV keppra and admitted to PCU. Had negative MRI brain and CT head. Hospital course was complicated by psychosis and agitation and required precedex gtt for agitation. Psychiatry was consulted and he was ultimately discharged to behavior health on 04/09/20 and was IVC'ed.  Hospitalist service was consulted by behavior health NP after sitter heard patient scream and witnessed a generalized tonic-clonic seizure that lasted about 2 minutes.  He was given 2 mg of Ativan IV and seen by neurology.    Currently on Lamictal 175 mg twice daily, Klonopin 0.5 mg daily, Keppra 1500 mg twice daily.   05/10/20: Seen and examined.  He is alert and oriented x4, responds to all questions appropriately.  Calm with no complaints.  Will obtain level of Keppra and Lamictal.  Neurology will reassess prior to DC.  Patient will benefit from home health RN to assist with management of his seizure medications.  TOC has been consulted to assist with home health services.    Patient denies any suicidal or homicidal ideation.  05/12/2020: Pt seen today for f/u is waiting for placement and is medically stable for d/c. Pt denies any complaints.   Assessment/Plan: Principal Problem:   Seizure (HCC) Active Problems:   Psychosis (HCC)   Breakthrough seizures, likely secondary to AEDs noncompliance AEDs adjusted by neurology Currently on Lamictal 175 mg twice daily, Klonopin 0.5 mg daily,  Keppra 1500 mg twice daily. Continue seizure precautions IV Ativan as needed for active seizures greater than 3 minutes as recommended by neurology Appreciate neurology's assistance, Dr. Loretha Brasil will see today for dc planning recommendations Patient will benefit from home health RN to assist with management of his seizure medications.  TOC has been consulted to assist with home health services.    Seizure disorder Management per above Obtaining Lamictal and Keppra levels, follow results.  Resolved agitation suspect related to alcohol withdrawal Admitted from behavioral health No evidence of alcohol withdrawal at the time of this visit. Continue psych medications, multivitamins, thiamine and folic acid. Seen by psych no need for inpatient psych   Code Status: Full code  Family Communication: None at bedside   Consultants:  Neurology  Psychiatry  Procedures:  None  Antimicrobials:  None  DVT prophylaxis: Subcu Lovenox daily  Status is: Inpatient    Dispo: The patient is from: Behavioral health               Anticipated d/c is to: Home with home health services              Anticipated d/c date is: 05/12/2020              Patient currently stable for discharge, awaiting neurology reassessment and home health services arrangement.    Objective: Vitals:   05/12/20 0534 05/12/20 0807 05/12/20 1120 05/12/20 1526  BP: 116/80 107/72 102/71 109/71  Pulse: 82 78 86 91  Resp:  18 18 18   Temp: 98.3 F (36.8 C) 98.4 F (36.9 C) 98.6 F (37 C) 99.2 F (  37.3 C)  TempSrc: Oral Oral Oral Oral  SpO2: 99% 99% 97% 98%  Weight: 65.5 kg     Height:        Intake/Output Summary (Last 24 hours) at 05/12/2020 1804 Last data filed at 05/12/2020 1416 Gross per 24 hour  Intake 1200 ml  Output 0 ml  Net 1200 ml   Filed Weights   05/08/20 0000 05/08/20 0606 05/12/20 0534  Weight: 65.2 kg 64.9 kg 65.5 kg    Exam:  . General: 49 y.o. year-old male well-developed  well-nourished no acute distress.  Alert and oriented x4.   . Cardiovascular: Regular rate and rhythm no rubs or gallops. [name] Respiratory: Clear to auscultation no wheezes or rales.   . Abdomen: Soft nontender normal bowel sounds present . Musculoskeletal: No lower extremity edema bilaterally.   [name] Psychiatry: Mood is appropriate for condition and setting   Data Reviewed: CBC: Recent Labs  Lab 05/08/20 0636 05/10/20 0700 05/12/20 0944  WBC 7.8 6.4 7.1  NEUTROABS  --  4.6 4.9  HGB 11.9* 11.9* 12.5*  HCT 33.6* 35.9* 36.5*  MCV 84.2 87.6 84.7  PLT 327 311 338   Basic Metabolic Panel: Recent Labs  Lab 05/08/20 0636 05/10/20 0700 05/12/20 0944  NA 138 137 138  K 3.8 4.0 4.1  CL 108 109 106  CO2 23 18* 23  GLUCOSE 98 94 96  BUN 22* 18 18  CREATININE 0.99 0.94 0.99  CALCIUM 8.8* 8.6* 9.0  MG 2.2 2.1 2.0  PHOS  --   --  4.1   GFR: Estimated Creatinine Clearance: 82.7 mL/min (by C-G formula based on SCr of 0.99 mg/dL). Liver Function Tests: Recent Labs  Lab 05/08/20 0636 05/12/20 0944  AST 17 15  ALT 22 21  ALKPHOS 84 84  BILITOT 0.6 0.5  PROT 6.5 6.7  ALBUMIN 3.8 3.9  Urine analysis:    Component Value Date/Time   COLORURINE YELLOW (A) 05/27/2016 0200   APPEARANCEUR CLEAR (A) 05/27/2016 0200   LABSPEC 1.020 05/27/2016 0200   PHURINE 5.0 05/27/2016 0200   GLUCOSEU NEGATIVE 05/27/2016 0200   HGBUR NEGATIVE 05/27/2016 0200   BILIRUBINUR NEGATIVE 05/27/2016 0200   KETONESUR TRACE (A) 05/27/2016 0200   PROTEINUR 100 (A) 05/27/2016 0200   NITRITE NEGATIVE 05/27/2016 0200   LEUKOCYTESUR NEGATIVE 05/27/2016 0200   Studies: No results found.  Scheduled Meds: . clonazepam  0.5 mg Oral BID  . enoxaparin (LOVENOX) injection  40 mg Subcutaneous Q24H  . haloperidol  2 mg Oral BID  . lamoTRIgine  175 mg Oral BID  . levETIRAcetam  1,500 mg Oral BID  . melatonin  5 mg Oral QHS  . OLANZapine  5 mg Oral QHS  . vitamin B-6  50 mg Oral Daily  . thiamine  100 mg Oral  Daily  . vitamin B-12  1,000 mcg Oral Daily      LOS: 5 days     Gertha Calkin, MD [facility] Pager [phone] If 7PM-7AM, please contact night-coverage [email or web address] Password [facility] 05/12/2020, 6:04 PM

## 2020-05-13 LAB — CBC WITH DIFFERENTIAL/PLATELET
Abs Immature Granulocytes: 0.05 10*3/uL (ref 0.00–0.07)
Basophils Absolute: 0 10*3/uL (ref 0.0–0.1)
Basophils Relative: 0 %
Eosinophils Absolute: 0.4 10*3/uL (ref 0.0–0.5)
Eosinophils Relative: 6 %
HCT: 35.7 % — ABNORMAL LOW (ref 39.0–52.0)
Hemoglobin: 12.1 g/dL — ABNORMAL LOW (ref 13.0–17.0)
Immature Granulocytes: 1 %
Lymphocytes Relative: 16 %
Lymphs Abs: 1.1 10*3/uL (ref 0.7–4.0)
MCH: 29.5 pg (ref 26.0–34.0)
MCHC: 33.9 g/dL (ref 30.0–36.0)
MCV: 87.1 fL (ref 80.0–100.0)
Monocytes Absolute: 0.7 10*3/uL (ref 0.1–1.0)
Monocytes Relative: 10 %
Neutro Abs: 4.7 10*3/uL (ref 1.7–7.7)
Neutrophils Relative %: 67 %
Platelets: 307 10*3/uL (ref 150–400)
RBC: 4.1 MIL/uL — ABNORMAL LOW (ref 4.22–5.81)
RDW: 13.4 % (ref 11.5–15.5)
WBC: 6.9 10*3/uL (ref 4.0–10.5)
nRBC: 0 % (ref 0.0–0.2)

## 2020-05-13 LAB — COMPREHENSIVE METABOLIC PANEL
ALT: 22 U/L (ref 0–44)
AST: 15 U/L (ref 15–41)
Albumin: 3.7 g/dL (ref 3.5–5.0)
Alkaline Phosphatase: 79 U/L (ref 38–126)
Anion gap: 9 (ref 5–15)
BUN: 23 mg/dL — ABNORMAL HIGH (ref 6–20)
CO2: 20 mmol/L — ABNORMAL LOW (ref 22–32)
Calcium: 8.9 mg/dL (ref 8.9–10.3)
Chloride: 109 mmol/L (ref 98–111)
Creatinine, Ser: 1.2 mg/dL (ref 0.61–1.24)
GFR calc Af Amer: >60 mL/min (ref >60)
GFR calc non Af Amer: >60 mL/min (ref >60)
Glucose, Bld: 101 mg/dL — ABNORMAL HIGH (ref 70–99)
Potassium: 4.1 mmol/L (ref 3.5–5.1)
Sodium: 138 mmol/L (ref 135–145)
Total Bilirubin: 0.6 mg/dL (ref 0.3–1.2)
Total Protein: 6.3 g/dL — ABNORMAL LOW (ref 6.5–8.1)

## 2020-05-13 LAB — PHOSPHORUS: Phosphorus: 4.6 mg/dL (ref 2.5–4.6)

## 2020-05-13 LAB — LEVETIRACETAM LEVEL: Levetiracetam Lvl: 33.1 ug/mL (ref 10.0–40.0)

## 2020-05-13 LAB — MAGNESIUM: Magnesium: 2 mg/dL (ref 1.7–2.4)

## 2020-05-13 NOTE — Progress Notes (Signed)
PROGRESS NOTE  Lance Ray KCM:[id number] DOB: [birth year] DOA: 05/07/2020 PCP: Patient, No Pcp Per  HPI/Recap of past 24 hours:  HPI: Lance Ray is a 49 y.o. male with medical history significant for seizure disorder, chronic alcohol abuse, noncompliance with regimen of treatment of his seizures, who was admitted from behavioral health due to recurrent seizures.    Patient initally admitted on 03/24/20 under hospitalist service for generalized tonic clonic seizure due to non-compliance with Depakote. He was loaded with IV keppra and admitted to PCU. Had negative MRI brain and CT head. Hospital course was complicated by psychosis and agitation and required precedex gtt for agitation. Psychiatry was consulted and he was ultimately discharged to behavior health on 04/09/20 and was IVC'ed.  Hospitalist service was consulted by behavior health NP after sitter heard patient scream and witnessed a generalized tonic-clonic seizure that lasted about 2 minutes.  He was given 2 mg of Ativan IV and seen by neurology.    Currently on Lamictal 175 mg twice daily, Klonopin 0.5 mg daily, Keppra 1500 mg twice daily.   05/10/20: Seen and examined.  He is alert and oriented x4, responds to all questions appropriately.  Calm with no complaints.  Will obtain level of Keppra and Lamictal.  Neurology will reassess prior to DC.  Patient will benefit from home health RN to assist with management of his seizure medications.  TOC has been consulted to assist with home health services.    Patient denies any suicidal or homicidal ideation.  05/12/2020: Pt seen today for f/u is waiting for placement and is medically stable for d/c. Pt denies any complaints.   05/13/20: Pt seen today and no change in condition vitals are stable and guardianship papers Pending for d/c.    Assessment/Plan: Principal Problem:   Seizure (HCC) Active Problems:   Psychosis (HCC)   Breakthrough seizures, likely secondary to  AEDs noncompliance AEDs adjusted by neurology Currently on Lamictal 175 mg twice daily, Klonopin 0.5 mg daily, Keppra 1500 mg twice daily. Continue seizure precautions IV Ativan as needed for active seizures greater than 3 minutes as recommended by neurology Appreciate neurology's assistance, Dr. Loretha Brasil will see today for dc planning recommendations Patient will benefit from home health RN to assist with management of his seizure medications.  TOC has been consulted to assist with home health services.    Seizure disorder Management per above Obtaining Lamictal and Keppra levels, follow results.  Resolved agitation suspect related to alcohol withdrawal Admitted from behavioral health No evidence of alcohol withdrawal at the time of this visit. Continue psych medications, multivitamins, thiamine and folic acid. Seen by psych no need for inpatient psych   Code Status: Full code  Family Communication: None at bedside   Consultants:  Neurology  Psychiatry  Procedures:  None  Antimicrobials:  None  DVT prophylaxis: Subcu Lovenox daily  Status is: Inpatient    Dispo: The patient is from: Behavioral health               Anticipated d/c is to: Home with home health services              Anticipated d/c date is: 05/12/2020              Patient currently stable for discharge, awaiting neurology reassessment and home health services arrangement.    Objective: Vitals:   05/13/20 0742 05/13/20 0742 05/13/20 0855 05/13/20 1235  BP: (!) 85/62 (!) 85/62 106/69 96/70  Pulse: 83 83  89  Resp:      Temp: 98 F (36.7 C) 98 F (36.7 C)  98.6 F (37 C)  TempSrc: Oral Oral  Oral  SpO2: 97% 97% 97% 97%  Weight:      Height:        Intake/Output Summary (Last 24 hours) at 05/13/2020 1638 Last data filed at 05/13/2020 1350 Gross per 24 hour  Intake 1200 ml  Output 0 ml  Net 1200 ml   Filed Weights   05/08/20 0606 05/12/20 0534 05/13/20 0513  Weight: 64.9 kg 65.5 kg  64.3 kg    Exam:  . General: 49 y.o. year-old male well-developed well-nourished no acute distress.  Alert and oriented x4.   . Cardiovascular: Regular rate and rhythm no rubs or gallops. [name] Respiratory: Clear to auscultation no wheezes or rales.   . Abdomen: Soft nontender normal bowel sounds present . Musculoskeletal: No lower extremity edema bilaterally.   [name] Psychiatry: Mood is appropriate for condition and setting   Data Reviewed: CBC: Recent Labs  Lab 05/08/20 0636 05/10/20 0700 05/12/20 0944 05/13/20 0645  WBC 7.8 6.4 7.1 6.9  NEUTROABS  --  4.6 4.9 4.7  HGB 11.9* 11.9* 12.5* 12.1*  HCT 33.6* 35.9* 36.5* 35.7*  MCV 84.2 87.6 84.7 87.1  PLT 327 311 338 307   Basic Metabolic Panel: Recent Labs  Lab 05/08/20 0636 05/10/20 0700 05/12/20 0944 05/13/20 0645  NA 138 137 138 138  K 3.8 4.0 4.1 4.1  CL 108 109 106 109  CO2 23 18* 23 20*  GLUCOSE 98 94 96 101*  BUN 22* 18 18 23*  CREATININE 0.99 0.94 0.99 1.20  CALCIUM 8.8* 8.6* 9.0 8.9  MG 2.2 2.1 2.0 2.0  PHOS  --   --  4.1 4.6   GFR: Estimated Creatinine Clearance: 67 mL/min (by C-G formula based on SCr of 1.2 mg/dL). Liver Function Tests: Recent Labs  Lab 05/08/20 0636 05/12/20 0944 05/13/20 0645  AST 17 15 15   ALT 22 21 22   ALKPHOS 84 84 79  BILITOT 0.6 0.5 0.6  PROT 6.5 6.7 6.3*  ALBUMIN 3.8 3.9 3.7  Urine analysis:    Component Value Date/Time   COLORURINE YELLOW (A) 05/27/2016 0200   APPEARANCEUR CLEAR (A) 05/27/2016 0200   LABSPEC 1.020 05/27/2016 0200   PHURINE 5.0 05/27/2016 0200   GLUCOSEU NEGATIVE 05/27/2016 0200   HGBUR NEGATIVE 05/27/2016 0200   BILIRUBINUR NEGATIVE 05/27/2016 0200   KETONESUR TRACE (A) 05/27/2016 0200   PROTEINUR 100 (A) 05/27/2016 0200   NITRITE NEGATIVE 05/27/2016 0200   LEUKOCYTESUR NEGATIVE 05/27/2016 0200   Studies: No results found.  Scheduled Meds: . clonazepam  0.5 mg Oral BID  . enoxaparin (LOVENOX) injection  40 mg Subcutaneous Q24H  . haloperidol   2 mg Oral BID  . lamoTRIgine  175 mg Oral BID  . levETIRAcetam  1,500 mg Oral BID  . melatonin  5 mg Oral QHS  . OLANZapine  5 mg Oral QHS  . vitamin B-6  50 mg Oral Daily  . thiamine  100 mg Oral Daily  . vitamin B-12  1,000 mcg Oral Daily      LOS: 6 days     07/27/2016, MD [facility] Pager [phone] If 7PM-7AM, please contact night-coverage [email or web address] Password [facility] 05/13/2020, 4:38 PM

## 2020-05-13 NOTE — Progress Notes (Signed)
Mobility Specialist - Progress Note   05/13/20 1141  Mobility  Activity Ambulated in room;Ambulated in hall  Level of Assistance Standby assist, set-up cues, supervision of patient - no hands on  Assistive Device None  Distance Ambulated (ft) 350 ft  Mobility Response Tolerated well  Mobility performed by Mobility specialist  $Mobility charge 1 Mobility    Pre-mobility: 90 HR, 95% SpO2 Post-mobility: 95 HR, 97% SpO2   Pt was laying in bed upon arrival. Pt agreed to mobility session this AM. Pt independent w/ bed mobility and S2S. Pt ambulated 350' total in room and hallway w/ SBA. CGA utilized for safety. Pt has a heavy-foot gait pattern, however, does not have LOB. Overall, pt tolerated session well. Pt left laying in bed w/ call bell and phone placed in reach.      Gabryela Kimbrell Mobility Specialist  05/13/20, 11:45 AM

## 2020-05-14 ENCOUNTER — Other Ambulatory Visit: Payer: Self-pay

## 2020-05-14 NOTE — Progress Notes (Signed)
Mobility Specialist - Progress Note   05/14/20 1140  Mobility  Activity Ambulated in room;Ambulated in hall  Level of Assistance Standby assist, set-up cues, supervision of patient - no hands on  Assistive Device None  Distance Ambulated (ft) 350 ft  Mobility Response Tolerated well  Mobility performed by Mobility specialist  $Mobility charge 1 Mobility    Pre-mobility: 85 HR, 100/71 BP, 99% SpO2 Post-mobility: 81 HR, 103/71 BP, 99% SpO2   Pt laying in bed w/ nurse present in room upon arrival. Pt agreed to mobility session. Pt independent w/ bed mobility and S2S. Pt ambulated 350' total in room and hallway w/ SBA. CGA utilized for safety precautions. Pt consistent w/ having a "heavy-foot" gait pattern, but did not have LOB during ambulation. However, pt did have LOB from standing bedside to sitting EOB d/t turning too quickly and "flopping" onto bed, rather than having control to sit EOB. Pt educated on proper mechanics when going from stand-to-sit for safety precautions.Pt showed understanding.  Pt practiced by walking to the other side of the bed and using proper mechanics. Overall, pt tolerated session well. Pt left laying in bed w/ call bell and phone placed in reach. Nurse was notified.    Thamar Holik Mobility Specialist  05/14/20, 11:54 AM

## 2020-05-14 NOTE — Progress Notes (Signed)
PROGRESS NOTE  Lance Ray KCL:[id number] DOB: 1971-04-28 DOA: 05/07/2020 PCP: Patient, No Pcp Per  HPI/Recap of past 24 hours:  HPI: Lance Ray is a 49 y.o. male with medical history significant for seizure disorder, chronic alcohol abuse, noncompliance with regimen of treatment of his seizures, who was admitted from behavioral health due to recurrent seizures.    Patient initally admitted on 03/24/20 under hospitalist service for generalized tonic clonic seizure due to non-compliance with Depakote. He was loaded with IV keppra and admitted to PCU. Had negative MRI brain and CT head. Hospital course was complicated by psychosis and agitation and required precedex gtt for agitation. Psychiatry was consulted and he was ultimately discharged to behavior health on 04/09/20 and was IVC'ed.  Hospitalist service was consulted by behavior health NP after sitter heard patient scream and witnessed a generalized tonic-clonic seizure that lasted about 2 minutes.  He was given 2 mg of Ativan IV and seen by neurology.    Currently on Lamictal 175 mg twice daily, Klonopin 0.5 mg daily, Keppra 1500 mg twice daily.   05/10/20: Seen and examined.  He is alert and oriented x4, responds to all questions appropriately.  Calm with no complaints.  Will obtain level of Keppra and Lamictal.  Neurology will reassess prior to DC.  Patient will benefit from home health RN to assist with management of his seizure medications.  TOC has been consulted to assist with home health services.    Patient denies any suicidal or homicidal ideation.  05/12/2020: Pt seen today for f/u is waiting for placement and is medically stable for d/c. Pt denies any complaints.   05/13/20: Pt seen today and no change in condition vitals are stable and guardianship papers Pending for d/c.   05/14/20: Pt sen today he is stable doing well and waiting for guardianship papers;  Assessment/Plan: Principal Problem:   Seizure  (HCC) Active Problems:   Psychosis (HCC)   Breakthrough seizures, likely secondary to AEDs noncompliance AEDs adjusted by neurology Currently on Lamictal 175 mg twice daily, Klonopin 0.5 mg daily, Keppra 1500 mg twice daily. Continue seizure precautions IV Ativan as needed for active seizures greater than 3 minutes as recommended by neurology Appreciate neurology's assistance, Dr. Loretha Brasil will see today for dc planning recommendations Patient will benefit from home health RN to assist with management of his seizure medications.  TOC has been consulted to assist with home health services.    Seizure disorder Management per above Obtaining Lamictal and Keppra levels, follow results.  Resolved agitation suspect related to alcohol withdrawal Admitted from behavioral health No evidence of alcohol withdrawal at the time of this visit. Continue psych medications, multivitamins, thiamine and folic acid. Seen by psych no need for inpatient psych   Code Status: Full code  Family Communication: None at bedside   Consultants:  Neurology  Psychiatry  Procedures:  None  Antimicrobials:  None  DVT prophylaxis: Subcu Lovenox daily  Status is: Inpatient    Dispo: The patient is from: Behavioral health               Anticipated d/c is to: Home with home health services              Anticipated d/c date is: 05/12/2020              Patient currently stable for discharge, awaiting neurology reassessment and home health services arrangement.    Objective: Vitals:   05/13/20 1718 05/13/20 1953 05/14/20 0855 05/14/20  1057  BP: 105/71 109/76 103/74 100/73  Pulse: 87 91 79 82  Resp:  16    Temp: 98.7 F (37.1 C) 98.7 F (37.1 C) 98.2 F (36.8 C) 98.4 F (36.9 C)  TempSrc: Oral Oral Oral Oral  SpO2: 97% 95% 97% 97%  Weight:      Height:        Intake/Output Summary (Last 24 hours) at 05/14/2020 1449 Last data filed at 05/14/2020 1000 Gross per 24 hour  Intake 480 ml   Output --  Net 480 ml   Filed Weights   05/08/20 0606 05/12/20 0534 05/13/20 0513  Weight: 64.9 kg 65.5 kg 64.3 kg    Exam:  . General: 49 y.o. year-old male well-developed well-nourished no acute distress.  Alert and oriented x4.   . Cardiovascular: Regular rate and rhythm no rubs or gallops. [name] Respiratory: Clear to auscultation no wheezes or rales.   . Abdomen: Soft nontender normal bowel sounds present . Musculoskeletal: No lower extremity edema bilaterally.   [name] Psychiatry: Mood is appropriate for condition and setting   Data Reviewed: CBC: Recent Labs  Lab 05/08/20 0636 05/10/20 0700 05/12/20 0944 05/13/20 0645  WBC 7.8 6.4 7.1 6.9  NEUTROABS  --  4.6 4.9 4.7  HGB 11.9* 11.9* 12.5* 12.1*  HCT 33.6* 35.9* 36.5* 35.7*  MCV 84.2 87.6 84.7 87.1  PLT 327 311 338 307   Basic Metabolic Panel: Recent Labs  Lab 05/08/20 0636 05/10/20 0700 05/12/20 0944 05/13/20 0645  NA 138 137 138 138  K 3.8 4.0 4.1 4.1  CL 108 109 106 109  CO2 23 18* 23 20*  GLUCOSE 98 94 96 101*  BUN 22* 18 18 23*  CREATININE 0.99 0.94 0.99 1.20  CALCIUM 8.8* 8.6* 9.0 8.9  MG 2.2 2.1 2.0 2.0  PHOS  --   --  4.1 4.6   GFR: Estimated Creatinine Clearance: 67 mL/min (by C-G formula based on SCr of 1.2 mg/dL). Liver Function Tests: Recent Labs  Lab 05/08/20 0636 05/12/20 0944 05/13/20 0645  AST 17 15 15   ALT 22 21 22   ALKPHOS 84 84 79  BILITOT 0.6 0.5 0.6  PROT 6.5 6.7 6.3*  ALBUMIN 3.8 3.9 3.7  Urine analysis:    Component Value Date/Time   COLORURINE YELLOW (A) 05/27/2016 0200   APPEARANCEUR CLEAR (A) 05/27/2016 0200   LABSPEC 1.020 05/27/2016 0200   PHURINE 5.0 05/27/2016 0200   GLUCOSEU NEGATIVE 05/27/2016 0200   HGBUR NEGATIVE 05/27/2016 0200   BILIRUBINUR NEGATIVE 05/27/2016 0200   KETONESUR TRACE (A) 05/27/2016 0200   PROTEINUR 100 (A) 05/27/2016 0200   NITRITE NEGATIVE 05/27/2016 0200   LEUKOCYTESUR NEGATIVE 05/27/2016 0200   Studies: No results found.  Scheduled  Meds: . clonazepam  0.5 mg Oral BID  . enoxaparin (LOVENOX) injection  40 mg Subcutaneous Q24H  . haloperidol  2 mg Oral BID  . lamoTRIgine  175 mg Oral BID  . levETIRAcetam  1,500 mg Oral BID  . melatonin  5 mg Oral QHS  . OLANZapine  5 mg Oral QHS  . vitamin B-6  50 mg Oral Daily  . thiamine  100 mg Oral Daily  . vitamin B-12  1,000 mcg Oral Daily      LOS: 7 days     07/27/2016, MD [facility] Pager [phone] If 7PM-7AM, please contact night-coverage [email or web address] Password [facility] 05/14/2020, 2:49 PM

## 2020-05-15 ENCOUNTER — Encounter: Payer: Self-pay | Admitting: Family Medicine

## 2020-05-15 NOTE — Progress Notes (Signed)
PROGRESS NOTE  Lance Ray BZJ:[id number] DOB: 1971/08/30 DOA: 05/07/2020 PCP: Patient, No Pcp Per  HPI/Recap of past 24 hours:  HPI: Lance Ray is a 49 y.o. male with medical history significant for seizure disorder, chronic alcohol abuse, noncompliance with regimen of treatment of his seizures, who was admitted from behavioral health due to recurrent seizures.    Patient initally admitted on 03/24/20 under hospitalist service for generalized tonic clonic seizure due to non-compliance with Depakote. He was loaded with IV keppra and admitted to PCU. Had negative MRI brain and CT head. Hospital course was complicated by psychosis and agitation and required precedex gtt for agitation. Psychiatry was consulted and he was ultimately discharged to behavior health on 04/09/20 and was IVC'ed.  Hospitalist service was consulted by behavior health NP after sitter heard patient scream and witnessed a generalized tonic-clonic seizure that lasted about 2 minutes.  He was given 2 mg of Ativan IV and seen by neurology.    Currently on Lamictal 175 mg twice daily, Klonopin 0.5 mg daily, Keppra 1500 mg twice daily.   05/10/20: Seen and examined.  He is alert and oriented x4, responds to all questions appropriately.  Calm with no complaints.  Will obtain level of Keppra and Lamictal.  Neurology will reassess prior to DC.  Patient will benefit from home health RN to assist with management of his seizure medications.  TOC has been consulted to assist with home health services.    Patient denies any suicidal or homicidal ideation.  05/12/2020: Pt seen today for f/u is waiting for placement and is medically stable for d/c. Pt denies any complaints.   05/13/20: Pt seen today and no change in condition vitals are stable and guardianship papers Pending for d/c.   05/14/20: Pt sen today he is stable doing well and waiting for guardianship papers;  8/21: Pt is doing well and d/c pending guardianship  paperwork.   Assessment/Plan: Principal Problem:   Seizure (HCC) Active Problems:   Psychosis (HCC)   Breakthrough seizures, likely secondary to AEDs noncompliance- stable. AEDs adjusted by neurology Currently on Lamictal 175 mg twice daily, Klonopin 0.5 mg daily, Keppra 1500 mg twice daily. Continue seizure precautions IV Ativan as needed for active seizures greater than 3 minutes as recommended by neurology Appreciate neurology's assistance, Dr. Loretha Brasil will see today for dc planning recommendations Patient will benefit from home health RN to assist with management of his seizure medications.  TOC has been consulted to assist with home health services.    Seizure disorder Management per above Obtaining Lamictal and Keppra levels, follow results. Cont current regimen of keppra and lamictal.   agitation suspect related to alcohol withdrawal-resolved. Admitted from behavioral health No evidence of alcohol withdrawal at the time of this visit. Continue psych medications, multivitamins, thiamine and folic acid. Seen by psych no need for inpatient psych   Code Status: Full code  Family Communication: None at bedside   Consultants:  Neurology  Psychiatry  Procedures:  None  Antimicrobials:  None  DVT prophylaxis: Subcu Lovenox daily  Status is: Inpatient    Dispo: The patient is from: Behavioral health               Anticipated d/c is to: Home with home health services              Anticipated d/c date is: 05/12/2020              Patient currently stable for discharge, awaiting neurology  reassessment and home health services arrangement.    Objective: Vitals:   05/15/20 0504 05/15/20 0802 05/15/20 1153 05/15/20 1627  BP: 101/62 94/66 92/63  100/64  Pulse: 75 83 80 86  Resp: 18 17 18 17   Temp: 98.4 F (36.9 C) 98.3 F (36.8 C) 98.6 F (37 C) 98.4 F (36.9 C)  TempSrc: Oral  Oral   SpO2: 97% 97% 97% 98%  Weight:      Height:         Intake/Output Summary (Last 24 hours) at 05/15/2020 1917 Last data filed at 05/15/2020 1300 Gross per 24 hour  Intake 480 ml  Output 500 ml  Net -20 ml   Filed Weights   05/08/20 0606 05/12/20 0534 05/13/20 0513  Weight: 64.9 kg 65.5 kg 64.3 kg    Exam:  . General: 49 y.o. year-old male well-developed well-nourished no acute distress.  Alert and oriented x4.   . Cardiovascular: Regular rate and rhythm no rubs or gallops. 44 Respiratory: Clear to auscultation no wheezes or rales.   . Abdomen: Soft nontender normal bowel sounds present . Musculoskeletal: No lower extremity edema bilaterally.   [name] Psychiatry: Mood is appropriate for condition and setting   Data Reviewed: CBC: Recent Labs  Lab 05/10/20 0700 05/12/20 0944 05/13/20 0645  WBC 6.4 7.1 6.9  NEUTROABS 4.6 4.9 4.7  HGB 11.9* 12.5* 12.1*  HCT 35.9* 36.5* 35.7*  MCV 87.6 84.7 87.1  PLT 311 338 307   Basic Metabolic Panel: Recent Labs  Lab 05/10/20 0700 05/12/20 0944 05/13/20 0645  NA 137 138 138  K 4.0 4.1 4.1  CL 109 106 109  CO2 18* 23 20*  GLUCOSE 94 96 101*  BUN 18 18 23*  CREATININE 0.94 0.99 1.20  CALCIUM 8.6* 9.0 8.9  MG 2.1 2.0 2.0  PHOS  --  4.1 4.6   GFR: Estimated Creatinine Clearance: 67 mL/min (by C-G formula based on SCr of 1.2 mg/dL). Liver Function Tests: Recent Labs  Lab 05/12/20 0944 05/13/20 0645  AST 15 15  ALT 21 22  ALKPHOS 84 79  BILITOT 0.5 0.6  PROT 6.7 6.3*  ALBUMIN 3.9 3.7  Urine analysis:    Component Value Date/Time   COLORURINE YELLOW (A) 05/27/2016 0200   APPEARANCEUR CLEAR (A) 05/27/2016 0200   LABSPEC 1.020 05/27/2016 0200   PHURINE 5.0 05/27/2016 0200   GLUCOSEU NEGATIVE 05/27/2016 0200   HGBUR NEGATIVE 05/27/2016 0200   BILIRUBINUR NEGATIVE 05/27/2016 0200   KETONESUR TRACE (A) 05/27/2016 0200   PROTEINUR 100 (A) 05/27/2016 0200   NITRITE NEGATIVE 05/27/2016 0200   LEUKOCYTESUR NEGATIVE 05/27/2016 0200   Studies: No results found.  Scheduled  Meds: . clonazepam  0.5 mg Oral BID  . enoxaparin (LOVENOX) injection  40 mg Subcutaneous Q24H  . haloperidol  2 mg Oral BID  . lamoTRIgine  175 mg Oral BID  . levETIRAcetam  1,500 mg Oral BID  . melatonin  5 mg Oral QHS  . OLANZapine  5 mg Oral QHS  . vitamin B-6  50 mg Oral Daily  . thiamine  100 mg Oral Daily  . vitamin B-12  1,000 mcg Oral Daily      LOS: 8 days     07/27/2016, MD [facility] Pager [phone] If 7PM-7AM, please contact night-coverage [email or web address] Password [facility] 05/15/2020, 7:17 PM

## 2020-05-16 NOTE — Progress Notes (Signed)
PROGRESS NOTE  Lance Ray UVO:[id number] DOB: 26-Nov-1970 DOA: 05/07/2020 PCP: Patient, No Pcp Per  HPI/Recap of past 24 hours:  HPI: Lance Ray is a 49 y.o. male with medical history significant for seizure disorder, chronic alcohol abuse, noncompliance with regimen of treatment of his seizures, who was admitted from behavioral health due to recurrent seizures.    Patient initally admitted on 03/24/20 under hospitalist service for generalized tonic clonic seizure due to non-compliance with Depakote. He was loaded with IV keppra and admitted to PCU. Had negative MRI brain and CT head. Hospital course was complicated by psychosis and agitation and required precedex gtt for agitation. Psychiatry was consulted and he was ultimately discharged to behavior health on 04/09/20 and was IVC'ed.  Hospitalist service was consulted by behavior health NP after sitter heard patient scream and witnessed a generalized tonic-clonic seizure that lasted about 2 minutes.  He was given 2 mg of Ativan IV and seen by neurology.    Currently on Lamictal 175 mg twice daily, Klonopin 0.5 mg daily, Keppra 1500 mg twice daily.   05/10/20: Seen and examined.  He is alert and oriented x4, responds to all questions appropriately.  Calm with no complaints.  Will obtain level of Keppra and Lamictal.  Neurology will reassess prior to DC.  Patient will benefit from home health RN to assist with management of his seizure medications.  TOC has been consulted to assist with home health services.    Patient denies any suicidal or homicidal ideation.  05/12/2020: Pt seen today for f/u is waiting for placement and is medically stable for d/c. Pt denies any complaints.   05/13/20: Pt seen today and no change in condition vitals are stable and guardianship papers Pending for d/c.   05/14/20: Pt sen today he is stable doing well and waiting for guardianship papers;  8/21: Pt is doing well and d/c pending guardianship  paperwork.  8/22: Pt seen today for f/u no change and still pending social worker and d/c to snf.    Assessment/Plan: Principal Problem:   Seizure (HCC) Active Problems:   Psychosis (HCC)   Breakthrough seizures, likely secondary to AEDs noncompliance- stable. AEDs adjusted by neurology Currently on Lamictal 175 mg twice daily, Klonopin 0.5 mg daily, Keppra 1500 mg twice daily. Continue seizure precautions IV Ativan as needed for active seizures greater than 3 minutes as recommended by neurology Appreciate neurology's assistance, Dr. Loretha Brasil will see today for dc planning recommendations Patient will benefit from home health RN to assist with management of his seizure medications.  TOC has been consulted to assist with home health services.    Seizure disorder Management per above Obtaining Lamictal and Keppra levels, follow results. Cont current regimen of keppra and lamictal.   agitation suspect related to alcohol withdrawal-resolved. Admitted from behavioral health No evidence of alcohol withdrawal at the time of this visit. Continue psych medications, multivitamins, thiamine and folic acid. Seen by psych no need for inpatient psych   Code Status: Full code  Family Communication: None at bedside   Consultants:  Neurology  Psychiatry  Procedures:  None  Antimicrobials:  None  DVT prophylaxis: Subcu Lovenox daily  Status is: Inpatient    Dispo: The patient is from: Behavioral health               Anticipated d/c is to: Home with home health services              Anticipated d/c date is: 05/12/2020  Patient currently stable for discharge, awaiting neurology reassessment and home health services arrangement.    Objective: Vitals:   05/16/20 0200 05/16/20 0553 05/16/20 0800 05/16/20 1218  BP:  108/67 98/61 96/65   Pulse:  78 77 84  Resp: 15 18    Temp:  98.6 F (37 C) 97.9 F (36.6 C) 98.4 F (36.9 C)  TempSrc:  Oral Oral Oral    SpO2:  98% 96% 97%  Weight:      Height:        Intake/Output Summary (Last 24 hours) at 05/16/2020 1603 Last data filed at 05/16/2020 1300 Gross per 24 hour  Intake 480 ml  Output --  Net 480 ml   Filed Weights   05/08/20 0606 05/12/20 0534 05/13/20 0513  Weight: 64.9 kg 65.5 kg 64.3 kg    Exam:  . General: 49 y.o. year-old male well-developed well-nourished no acute distress.  Alert and oriented x4.   . Cardiovascular: Regular rate and rhythm no rubs or gallops. [name] Respiratory: Clear to auscultation no wheezes or rales.   . Abdomen: Soft nontender normal bowel sounds present . Musculoskeletal: No lower extremity edema bilaterally.   [name] Psychiatry: Mood is appropriate for condition and setting   Data Reviewed: CBC: Recent Labs  Lab 05/10/20 0700 05/12/20 0944 05/13/20 0645  WBC 6.4 7.1 6.9  NEUTROABS 4.6 4.9 4.7  HGB 11.9* 12.5* 12.1*  HCT 35.9* 36.5* 35.7*  MCV 87.6 84.7 87.1  PLT 311 338 307   Basic Metabolic Panel: Recent Labs  Lab 05/10/20 0700 05/12/20 0944 05/13/20 0645  NA 137 138 138  K 4.0 4.1 4.1  CL 109 106 109  CO2 18* 23 20*  GLUCOSE 94 96 101*  BUN 18 18 23*  CREATININE 0.94 0.99 1.20  CALCIUM 8.6* 9.0 8.9  MG 2.1 2.0 2.0  PHOS  --  4.1 4.6   GFR: Estimated Creatinine Clearance: 67 mL/min (by C-G formula based on SCr of 1.2 mg/dL). Liver Function Tests: Recent Labs  Lab 05/12/20 0944 05/13/20 0645  AST 15 15  ALT 21 22  ALKPHOS 84 79  BILITOT 0.5 0.6  PROT 6.7 6.3*  ALBUMIN 3.9 3.7  Urine analysis:    Component Value Date/Time   COLORURINE YELLOW (A) 05/27/2016 0200   APPEARANCEUR CLEAR (A) 05/27/2016 0200   LABSPEC 1.020 05/27/2016 0200   PHURINE 5.0 05/27/2016 0200   GLUCOSEU NEGATIVE 05/27/2016 0200   HGBUR NEGATIVE 05/27/2016 0200   BILIRUBINUR NEGATIVE 05/27/2016 0200   KETONESUR TRACE (A) 05/27/2016 0200   PROTEINUR 100 (A) 05/27/2016 0200   NITRITE NEGATIVE 05/27/2016 0200   LEUKOCYTESUR NEGATIVE 05/27/2016 0200    Studies: No results found.  Scheduled Meds: . clonazepam  0.5 mg Oral BID  . enoxaparin (LOVENOX) injection  40 mg Subcutaneous Q24H  . haloperidol  2 mg Oral BID  . lamoTRIgine  175 mg Oral BID  . levETIRAcetam  1,500 mg Oral BID  . melatonin  5 mg Oral QHS  . OLANZapine  5 mg Oral QHS  . vitamin B-6  50 mg Oral Daily  . thiamine  100 mg Oral Daily  . vitamin B-12  1,000 mcg Oral Daily      LOS: 9 days   Gertha Calkin, MD [facility] Pager [phone] If 7PM-7AM, please contact night-coverage [email or web address] Password [facility] 05/16/2020, 4:03 PM

## 2020-05-17 LAB — BASIC METABOLIC PANEL
Anion gap: 9 (ref 5–15)
BUN: 21 mg/dL — ABNORMAL HIGH (ref 6–20)
CO2: 20 mmol/L — ABNORMAL LOW (ref 22–32)
Calcium: 8.6 mg/dL — ABNORMAL LOW (ref 8.9–10.3)
Chloride: 108 mmol/L (ref 98–111)
Creatinine, Ser: 1.15 mg/dL (ref 0.61–1.24)
GFR calc Af Amer: >60 mL/min (ref >60)
GFR calc non Af Amer: >60 mL/min (ref >60)
Glucose, Bld: 88 mg/dL (ref 70–99)
Potassium: 3.8 mmol/L (ref 3.5–5.1)
Sodium: 137 mmol/L (ref 135–145)

## 2020-05-17 LAB — CBC WITH DIFFERENTIAL/PLATELET
Abs Immature Granulocytes: 0.14 10*3/uL — ABNORMAL HIGH (ref 0.00–0.07)
Basophils Absolute: 0.1 10*3/uL (ref 0.0–0.1)
Basophils Relative: 1 %
Eosinophils Absolute: 1.4 10*3/uL — ABNORMAL HIGH (ref 0.0–0.5)
Eosinophils Relative: 17 %
HCT: 34.6 % — ABNORMAL LOW (ref 39.0–52.0)
Hemoglobin: 12.1 g/dL — ABNORMAL LOW (ref 13.0–17.0)
Immature Granulocytes: 2 %
Lymphocytes Relative: 18 %
Lymphs Abs: 1.4 10*3/uL (ref 0.7–4.0)
MCH: 29.3 pg (ref 26.0–34.0)
MCHC: 35 g/dL (ref 30.0–36.0)
MCV: 83.8 fL (ref 80.0–100.0)
Monocytes Absolute: 0.7 10*3/uL (ref 0.1–1.0)
Monocytes Relative: 8 %
Neutro Abs: 4.5 10*3/uL (ref 1.7–7.7)
Neutrophils Relative %: 54 %
Platelets: 333 10*3/uL (ref 150–400)
RBC: 4.13 MIL/uL — ABNORMAL LOW (ref 4.22–5.81)
RDW: 13.6 % (ref 11.5–15.5)
WBC: 8.2 10*3/uL (ref 4.0–10.5)
nRBC: 0 % (ref 0.0–0.2)

## 2020-05-17 LAB — MAGNESIUM: Magnesium: 2.1 mg/dL (ref 1.7–2.4)

## 2020-05-17 MED ORDER — SODIUM CHLORIDE 0.9% FLUSH
10.0000 mL | Freq: Two times a day (BID) | INTRAVENOUS | Status: DC
  Administered 2020-05-17 – 2020-07-09 (×87): 10 mL via INTRAVENOUS

## 2020-05-17 NOTE — Progress Notes (Signed)
PROGRESS NOTE  Lance Ray JSH:[id number] DOB: [birth year] DOA: 05/07/2020 PCP: Patient, No Pcp Per  HPI/Recap of past 24 hours:  HPI: Lance Ray is a 49 y.o. male with medical history significant for seizure disorder, chronic alcohol abuse, noncompliance with regimen of treatment of his seizures, who was admitted from behavioral health due to recurrent seizures.    Patient initally admitted on 03/24/20 under hospitalist service for generalized tonic clonic seizure due to non-compliance with Depakote. He was loaded with IV keppra and admitted to PCU. Had negative MRI brain and CT head. Hospital course was complicated by psychosis and agitation and required precedex gtt for agitation. Psychiatry was consulted and he was ultimately discharged to behavior health on 04/09/20 and was IVC'ed.  Hospitalist service was consulted by behavior health NP after sitter heard patient scream and witnessed a generalized tonic-clonic seizure that lasted about 2 minutes.  He was given 2 mg of Ativan IV and seen by neurology.    Currently on Lamictal 175 mg twice daily, Klonopin 0.5 mg daily, Keppra 1500 mg twice daily.   05/10/20: Seen and examined.  He is alert and oriented x4, responds to all questions appropriately.  Calm with no complaints.  Will obtain level of Keppra and Lamictal.  Neurology will reassess prior to DC.  Patient will benefit from home health RN to assist with management of his seizure medications.  TOC has been consulted to assist with home health services.    Patient denies any suicidal or homicidal ideation.  05/12/2020: Pt seen today for f/u is waiting for placement and is medically stable for d/c. Pt denies any complaints.   05/13/20: Pt seen today and no change in condition vitals are stable and guardianship papers Pending for d/c.   05/14/20: Pt sen today he is stable doing well and waiting for guardianship papers;  8/21: Pt is doing well and d/c pending guardianship  paperwork.  8/22: Pt seen today for f/u no change and still pending social worker and d/c to snf.    8/23: Pr is stable waiting for placement. No complaints and is alert and awake .  Assessment/Plan: Principal Problem:   Seizure (HCC) Active Problems:   Psychosis (HCC)   Breakthrough seizures, likely secondary to AEDs noncompliance- stable. AEDs adjusted by neurology Currently on Lamictal 175 mg twice daily, Klonopin 0.5 mg daily, Keppra 1500 mg twice daily. Continue seizure precautions IV Ativan as needed for active seizures greater than 3 minutes as recommended by neurology Appreciate neurology's assistance, Dr. Loretha Brasil will see today for dc planning recommendations Patient will benefit from home health RN to assist with management of his seizure medications.  TOC has been consulted to assist with home health services.    Seizure disorder Management per above Obtaining Lamictal and Keppra levels, follow results. Cont current regimen of keppra and lamictal.   agitation suspect related to alcohol withdrawal-resolved. Admitted from behavioral health No evidence of alcohol withdrawal at the time of this visit. Continue psych medications, multivitamins, thiamine and folic acid. Seen by psych no need for inpatient psych   Code Status: Full code  Family Communication: None at bedside   Consultants:  Neurology  Psychiatry  Procedures:  None  Antimicrobials:  None  DVT prophylaxis: Subcu Lovenox daily  Status is: Inpatient    Dispo: The patient is from: Behavioral health               Anticipated d/c is to: Home with home health services  Anticipated d/c date is: 05/12/2020              Patient currently stable for discharge, awaiting neurology reassessment and home health services arrangement.    Objective: Vitals:   05/17/20 0836 05/17/20 1106 05/17/20 1614 05/17/20 1616  BP: 104/65 99/67 (!) 92/58 106/60  Pulse: 75 82 85 69  Resp:   17 17   Temp:  98.7 F (37.1 C) 98.9 F (37.2 C)   TempSrc:  Oral Oral   SpO2:  97% 97%   Weight:      Height:        Intake/Output Summary (Last 24 hours) at 05/17/2020 1840 Last data filed at 05/17/2020 1738 Gross per 24 hour  Intake 240 ml  Output 0 ml  Net 240 ml   Filed Weights   05/12/20 0534 05/13/20 0513 05/17/20 0354  Weight: 65.5 kg 64.3 kg 64 kg    Exam:  . General: 49 y.o. year-old male well-developed well-nourished no acute distress.  Alert and oriented x4.   . Cardiovascular: Regular rate and rhythm no rubs or gallops. [name] Respiratory: Clear to auscultation no wheezes or rales.   . Abdomen: Soft nontender normal bowel sounds present . Musculoskeletal: No lower extremity edema bilaterally.   [name] Psychiatry: Mood is appropriate for condition and setting   Data Reviewed: CBC: Recent Labs  Lab 05/12/20 0944 05/13/20 0645 05/17/20 0402  WBC 7.1 6.9 8.2  NEUTROABS 4.9 4.7 4.5  HGB 12.5* 12.1* 12.1*  HCT 36.5* 35.7* 34.6*  MCV 84.7 87.1 83.8  PLT 338 307 333   Basic Metabolic Panel: Recent Labs  Lab 05/12/20 0944 05/13/20 0645 05/17/20 0402  NA 138 138 137  K 4.1 4.1 3.8  CL 106 109 108  CO2 23 20* 20*  GLUCOSE 96 101* 88  BUN 18 23* 21*  CREATININE 0.99 1.20 1.15  CALCIUM 9.0 8.9 8.6*  MG 2.0 2.0 2.1  PHOS 4.1 4.6  --    GFR: Estimated Creatinine Clearance: 69.6 mL/min (by C-G formula based on SCr of 1.15 mg/dL). Liver Function Tests: Recent Labs  Lab 05/12/20 0944 05/13/20 0645  AST 15 15  ALT 21 22  ALKPHOS 84 79  BILITOT 0.5 0.6  PROT 6.7 6.3*  ALBUMIN 3.9 3.7  Urine analysis:    Component Value Date/Time   COLORURINE YELLOW (A) 05/27/2016 0200   APPEARANCEUR CLEAR (A) 05/27/2016 0200   LABSPEC 1.020 05/27/2016 0200   PHURINE 5.0 05/27/2016 0200   GLUCOSEU NEGATIVE 05/27/2016 0200   HGBUR NEGATIVE 05/27/2016 0200   BILIRUBINUR NEGATIVE 05/27/2016 0200   KETONESUR TRACE (A) 05/27/2016 0200   PROTEINUR 100 (A) 05/27/2016 0200     NITRITE NEGATIVE 05/27/2016 0200   LEUKOCYTESUR NEGATIVE 05/27/2016 0200   Studies: No results found.  Scheduled Meds: . clonazepam  0.5 mg Oral BID  . enoxaparin (LOVENOX) injection  40 mg Subcutaneous Q24H  . haloperidol  2 mg Oral BID  . lamoTRIgine  175 mg Oral BID  . levETIRAcetam  1,500 mg Oral BID  . melatonin  5 mg Oral QHS  . OLANZapine  5 mg Oral QHS  . vitamin B-6  50 mg Oral Daily  . thiamine  100 mg Oral Daily  . vitamin B-12  1,000 mcg Oral Daily      LOS: 10 days   Gertha Calkin, MD [facility] Pager [phone] If 7PM-7AM, please contact night-coverage [email or web address] Password [facility] 05/17/2020, 6:40 PM

## 2020-05-18 NOTE — Progress Notes (Signed)
PROGRESS NOTE  Lance Ray WCH:[id number] DOB: [birth year] DOA: 05/07/2020 PCP: Patient, No Pcp Per  HPI/Recap of past 24 hours:  HPI: Lance Ray is a 49 y.o. male with medical history significant for seizure disorder, chronic alcohol abuse, noncompliance with regimen of treatment of his seizures, who was admitted from behavioral health due to recurrent seizures.    Patient initally admitted on 03/24/20 under hospitalist service for generalized tonic clonic seizure due to non-compliance with Depakote. He was loaded with IV keppra and admitted to PCU. Had negative MRI brain and CT head. Hospital course was complicated by psychosis and agitation and required precedex gtt for agitation. Psychiatry was consulted and he was ultimately discharged to behavior health on 04/09/20 and was IVC'ed.  Hospitalist service was consulted by behavior health NP after sitter heard patient scream and witnessed a generalized tonic-clonic seizure that lasted about 2 minutes.  He was given 2 mg of Ativan IV and seen by neurology.    Currently on Lamictal 175 mg twice daily, Klonopin 0.5 mg daily, Keppra 1500 mg twice daily.   05/10/20: Seen and examined.  He is alert and oriented x4, responds to all questions appropriately.  Calm with no complaints.  Will obtain level of Keppra and Lamictal.  Neurology will reassess prior to DC.  Patient will benefit from home health RN to assist with management of his seizure medications.  TOC has been consulted to assist with home health services.    Patient denies any suicidal or homicidal ideation.  05/12/2020: Pt seen today for f/u is waiting for placement and is medically stable for d/c. Pt denies any complaints.   05/13/20: Pt seen today and no change in condition vitals are stable and guardianship papers Pending for d/c.   05/14/20: Pt sen today he is stable doing well and waiting for guardianship papers;  8/21: Pt is doing well and d/c pending guardianship  paperwork.  8/22: Pt seen today for f/u no change and still pending social worker and d/c to snf.    8/23: Pr is stable waiting for placement. No complaints and is alert and awake .  8/24: Patient's clinical condition is same.  He is currently waiting for guardianship papers for discharge purposes.  Currently patient denies any complaints is eating ambulating and review of systems is otherwise negative.  Patient has been seizure-free so far.   Assessment/Plan: Principal Problem:   Seizure (HCC) Active Problems:   Psychosis (HCC)   Breakthrough seizures, likely secondary to AEDs noncompliance- stable. AEDs adjusted by neurology Currently on Lamictal 175 mg twice daily, Klonopin 0.5 mg daily, Keppra 1500 mg twice daily. Continue seizure precautions IV Ativan as needed for active seizures greater than 3 minutes as recommended by neurology Appreciate neurology's assistance, Dr. Loretha Brasil will see today for dc planning recommendations Patient will benefit from home health RN to assist with management of his seizure medications.  TOC has been consulted to assist with home health services.    Seizure disorder Management per above Obtaining Lamictal and Keppra levels, follow results. Cont current regimen of keppra and lamictal.   Agitation suspect related to alcohol withdrawal-resolved. Admitted from behavioral health No evidence of alcohol withdrawal at the time of this visit. Continue psych medications, multivitamins, thiamine and folic acid. Seen by psych no need for inpatient psych  Code Status: Full code  Family Communication: None at bedside   Consultants:  Neurology  Psychiatry  Procedures:  None  Antimicrobials:  None  DVT prophylaxis: Subcu Lovenox  daily  Status is: Inpatient   Dispo: The patient is from: Behavioral health               Anticipated d/c is to: Home with home health services              Anticipated d/c date is: 05/12/2020               Patient currently stable for discharge, awaiting neurology reassessment and home health services arrangement.  Objective: Vitals:   05/18/20 0434 05/18/20 0856 05/18/20 1124 05/18/20 1658  BP: 102/71 107/74 98/73 105/67  Pulse: 69 71 81 85  Resp:  18 19 19   Temp: 98.6 F (37 C) 98.4 F (36.9 C) 97.9 F (36.6 C) 99.3 F (37.4 C)  TempSrc: Oral Oral  Oral  SpO2: 96% 99% 97% 98%  Weight: 65.3 kg     Height:        Intake/Output Summary (Last 24 hours) at 05/18/2020 1738 Last data filed at 05/18/2020 1049 Gross per 24 hour  Intake --  Output 0 ml  Net 0 ml   Filed Weights   05/13/20 0513 05/17/20 0354 05/18/20 0434  Weight: 64.3 kg 64 kg 65.3 kg    Exam:   General: 49 y.o. year-old male well-developed well-nourished no acute distress.  Alert and oriented x4.    Cardiovascular: Regular rate and rhythm no rubs or gallops.  Respiratory: Clear to auscultation no wheezes or rales.    Abdomen: Soft nontender normal bowel sounds present  Musculoskeletal: No lower extremity edema bilaterally.    Psychiatry: Mood is appropriate for condition and setting  Data Reviewed: CBC: Recent Labs  Lab 05/12/20 0944 05/13/20 0645 05/17/20 0402  WBC 7.1 6.9 8.2  NEUTROABS 4.9 4.7 4.5  HGB 12.5* 12.1* 12.1*  HCT 36.5* 35.7* 34.6*  MCV 84.7 87.1 83.8  PLT 338 307 333   Basic Metabolic Panel: Recent Labs  Lab 05/12/20 0944 05/13/20 0645 05/17/20 0402  NA 138 138 137  K 4.1 4.1 3.8  CL 106 109 108  CO2 23 20* 20*  GLUCOSE 96 101* 88  BUN 18 23* 21*  CREATININE 0.99 1.20 1.15  CALCIUM 9.0 8.9 8.6*  MG 2.0 2.0 2.1  PHOS 4.1 4.6  --    GFR: Estimated Creatinine Clearance: 71 mL/min (by C-G formula based on SCr of 1.15 mg/dL). Liver Function Tests: Recent Labs  Lab 05/12/20 0944 05/13/20 0645  AST 15 15  ALT 21 22  ALKPHOS 84 79  BILITOT 0.5 0.6  PROT 6.7 6.3*  ALBUMIN 3.9 3.7  Urine analysis:    Component Value Date/Time   COLORURINE YELLOW (A) 05/27/2016  0200   APPEARANCEUR CLEAR (A) 05/27/2016 0200   LABSPEC 1.020 05/27/2016 0200   PHURINE 5.0 05/27/2016 0200   GLUCOSEU NEGATIVE 05/27/2016 0200   HGBUR NEGATIVE 05/27/2016 0200   BILIRUBINUR NEGATIVE 05/27/2016 0200   KETONESUR TRACE (A) 05/27/2016 0200   PROTEINUR 100 (A) 05/27/2016 0200   NITRITE NEGATIVE 05/27/2016 0200   LEUKOCYTESUR NEGATIVE 05/27/2016 0200   Studies: No results found.  Scheduled Meds:  clonazepam  0.5 mg Oral BID   enoxaparin (LOVENOX) injection  40 mg Subcutaneous Q24H   haloperidol  2 mg Oral BID   lamoTRIgine  175 mg Oral BID   levETIRAcetam  1,500 mg Oral BID   melatonin  5 mg Oral QHS   OLANZapine  5 mg Oral QHS   vitamin B-6  50 mg Oral Daily   sodium chloride  flush  10 mL Intravenous Q12H   thiamine  100 mg Oral Daily   vitamin B-12  1,000 mcg Oral Daily      LOS: 11 days   Gertha Calkin, MD [facility] Pager [phone] If 7PM-7AM, please contact night-coverage [email or web address] Password [facility] 05/18/2020, 5:38 PM

## 2020-05-19 LAB — BASIC METABOLIC PANEL
Anion gap: 9 (ref 5–15)
BUN: 19 mg/dL (ref 6–20)
CO2: 22 mmol/L (ref 22–32)
Calcium: 8.8 mg/dL — ABNORMAL LOW (ref 8.9–10.3)
Chloride: 108 mmol/L (ref 98–111)
Creatinine, Ser: 1.13 mg/dL (ref 0.61–1.24)
GFR calc Af Amer: >60 mL/min (ref >60)
GFR calc non Af Amer: >60 mL/min (ref >60)
Glucose, Bld: 98 mg/dL (ref 70–99)
Potassium: 4 mmol/L (ref 3.5–5.1)
Sodium: 139 mmol/L (ref 135–145)

## 2020-05-19 LAB — CBC WITH DIFFERENTIAL/PLATELET
Abs Immature Granulocytes: 0.22 10*3/uL — ABNORMAL HIGH (ref 0.00–0.07)
Basophils Absolute: 0.1 10*3/uL (ref 0.0–0.1)
Basophils Relative: 1 %
Eosinophils Absolute: 3.1 10*3/uL — ABNORMAL HIGH (ref 0.0–0.5)
Eosinophils Relative: 27 %
HCT: 35.2 % — ABNORMAL LOW (ref 39.0–52.0)
Hemoglobin: 11.9 g/dL — ABNORMAL LOW (ref 13.0–17.0)
Immature Granulocytes: 2 %
Lymphocytes Relative: 15 %
Lymphs Abs: 1.8 10*3/uL (ref 0.7–4.0)
MCH: 29.3 pg (ref 26.0–34.0)
MCHC: 33.8 g/dL (ref 30.0–36.0)
MCV: 86.7 fL (ref 80.0–100.0)
Monocytes Absolute: 0.8 10*3/uL (ref 0.1–1.0)
Monocytes Relative: 7 %
Neutro Abs: 5.4 10*3/uL (ref 1.7–7.7)
Neutrophils Relative %: 48 %
Platelets: 316 10*3/uL (ref 150–400)
RBC: 4.06 MIL/uL — ABNORMAL LOW (ref 4.22–5.81)
RDW: 13.4 % (ref 11.5–15.5)
Smear Review: NORMAL
WBC: 11.4 10*3/uL — ABNORMAL HIGH (ref 4.0–10.5)
nRBC: 0 % (ref 0.0–0.2)

## 2020-05-19 LAB — PATHOLOGIST SMEAR REVIEW

## 2020-05-19 LAB — MAGNESIUM: Magnesium: 2.2 mg/dL (ref 1.7–2.4)

## 2020-05-19 NOTE — TOC Progression Note (Signed)
Transition of Care [facility]) - Progression Note    Patient Details  Name: Lance Ray MRN: [id number] Date of Birth: [birth year]  Transition of [facility]) CM/SW Contact  Shawn Route, RN Phone Number: 05/19/2020, 1:26 PM  Clinical Narrative:    Left another message for Guardian ad Litem, no response.  Sister [name] has also tried to reach him, but ultimately has been told she has to wait until Guardianship hearing.         Expected Discharge Plan and Services                                                 Social Determinants of Health (SDOH) Interventions    Readmission Risk Interventions Readmission Risk Prevention Plan 03/29/2020  Transportation Screening Complete  HRI or Home Care Consult Complete  Social Work Consult for Recovery Care Planning/Counseling Complete  Palliative Care Screening Not Applicable  Medication Review [employer]) Referral to Pharmacy  Some recent data might be hidden

## 2020-05-19 NOTE — Hospital Course (Addendum)
Lance Ray is a 49 y.o. male with medical history significant for dementia, psychosis, seizure disorder, chronic alcohol abuse, noncompliance with regimen of treatment of his seizures, who was admitted from behavioral health unit due to recurrent seizures.     Patient initally admitted on 03/24/20 under hospitalist service for generalized tonic clonic seizure due to non-compliance with Depakote. He was loaded with IV keppra and admitted to PCU. Had negative MRI brain and CT head. Hospital course was complicated by psychosis and agitation and required precedex gtt for agitation. Psychiatry was consulted and he was ultimately discharged to behavior health on 04/09/20 and was IVC'ed.  Hospitalist service was consulted by behavior health NP after sitter heard patient scream and witnessed a generalized tonic-clonic seizure that lasted about 2 minutes.  He was given 2 mg of Ativan IV and seen by neurology.    Per psychiatry and his sister, patient lacks capacity for decision-making, requires a guardian.  Guardian process undergoing.  Still awaiting bonding.  Dispo disposition to assisted living facility on 08/11/2020.

## 2020-05-19 NOTE — Progress Notes (Signed)
PROGRESS NOTE    Lance Ray   JTT:[id number]  DOB: [birth year]  PCP: Patient, No Pcp Per    DOA: 05/07/2020 LOS: 12   Brief Narrative   HPI and Hospital Course as summarized in Dr. Eliane Decree note of 8/24:  " HPI: Lance Ray is a 49 y.o. male with medical history significant for seizure disorder, chronic alcohol abuse, noncompliance with regimen of treatment of his seizures, who was admitted from behavioral health due to recurrent seizures.     Patient initally admitted on 03/24/20 under hospitalist service for generalized tonic clonic seizure due to non-compliance with Depakote. He was loaded with IV keppra and admitted to PCU. Had negative MRI brain and CT head. Hospital course was complicated by psychosis and agitation and required precedex gtt for agitation. Psychiatry was consulted and he was ultimately discharged to behavior health on 04/09/20 and was IVC'ed.  Hospitalist service was consulted by behavior health NP after sitter heard patient scream and witnessed a generalized tonic-clonic seizure that lasted about 2 minutes.  He was given 2 mg of Ativan IV and seen by neurology.     Currently on Lamictal 175 mg twice daily, Klonopin 0.5 mg daily, Keppra 1500 mg twice daily.     05/10/20: Seen and examined.  He is alert and oriented x4, responds to all questions appropriately.  Calm with no complaints.  Will obtain level of Keppra and Lamictal.  Neurology will reassess prior to DC.  Patient will benefit from home health RN to assist with management of his seizure medications.  TOC has been consulted to assist with home health services.     Patient denies any suicidal or homicidal ideation.   05/12/2020: Pt seen today for f/u is waiting for placement and is medically stable for d/c.  Pt denies any complaints.    05/13/20: Pt seen today and no change in condition vitals are stable and guardianship papers Pending for d/c.    05/14/20: Pt sen today he is stable doing well and  waiting for guardianship papers;   8/21: Pt is doing well and d/c pending guardianship paperwork.   8/22: Pt seen today for f/u no change and still pending social worker and d/c to snf.    8/23: Pr is stable waiting for placement. No complaints and is alert and awake .   8/24: Patient's clinical condition is same.  He is currently waiting for guardianship papers for discharge purposes.  Currently patient denies any complaints is eating ambulating and review of systems is otherwise negative.  Patient has been seizure-free so far."  I assumed care of patient on 8/25.  TOC has been unable to contact Guardian ad Litem.  Sister also unable to reach.  Awaiting guardianship hearing.    Per psychiatry, patient lacks capacity for decision-making. But he is no longer under IVC and does not meet criteria for inpatient psych at this time.  Assessment & Plan   Principal Problem:   Seizure [facility]) Active Problems:   Psychosis (HCC)   Breakthrough seizures, likely secondary to AEDs noncompliance- stable. AEDs adjusted by neurology Currently on Lamictal 175 mg twice daily, Klonopin 0.5 mg daily, Keppra 1500 mg twice daily. Continue seizure precautions IV Ativan as needed for active seizures greater than 3 minutes as recommended by neurology Appreciate neurology's assistance, Dr. Loretha Brasil will see today for dc planning recommendations Patient will benefit from home health RN to assist with management of his seizure medications.  TOC has been consulted to assist with home  health services and guardianship.    Seizure disorder Management per above Obtaining Lamictal and Keppra levels, follow results. Cont current regimen of keppra and lamictal.   Agitation suspect related to alcohol withdrawal-resolved. Admitted from behavioral health No evidence of alcohol withdrawal at the time of this visit. Continue psych medications, multivitamins, thiamine and folic acid.  Seen by psych no need for  inpatient psych   DVT prophylaxis: enoxaparin (LOVENOX) injection 40 mg Start: 05/08/20 0000   Diet:  Diet Orders (From admission, onward)    Start     Ordered   05/08/20 1110  Diet regular Room service appropriate? Yes; Fluid consistency: Thin  Diet effective now       Question Answer Comment  Room service appropriate? Yes   Fluid consistency: Thin      05/08/20 1109            Code Status: Full Code    Subjective 05/19/20    Patient seen at bedside this morning.  He denies any acute complaints including fevers or chills, chest pain or shortness of breath, nausea vomiting or diarrhea.  Says he is anxious for discharge and mentions his cat at home that he is not sure is being cared for.  Disposition Plan & Communication   Status is: Inpatient  Remains inpatient appropriate because: awaiting guardianship hearing.   Dispo:  Patient From: Home  Planned Disposition: ALF  Expected discharge date: pending guardianship hearing  Medically stable for discharge: No         Family Communication: None at bedside, will attempt to call   Consults, Procedures, Significant Events   Consultants:   None  Procedures:   None  Antimicrobials:   None    Objective   Vitals:   05/19/20 1259 05/19/20 1500 05/19/20 2004 05/19/20 2005  BP: (!) 91/47 99/64 99/66  (!) 93/58  Pulse: 81 77 81 87  Resp:   20   Temp: 98.6 F (37 C) 98.9 F (37.2 C) 99 F (37.2 C)   TempSrc: Oral  Oral   SpO2: 98%  97%   Weight:      Height:       No intake or output data in the 24 hours ending 05/19/20 2009 Filed Weights   05/13/20 0513 05/17/20 0354 05/18/20 0434  Weight: 64.3 kg 64 kg 65.3 kg    Physical Exam:  General exam: awake, alert, no acute distress, frail Respiratory system: CTAB, no wheezes, rales or rhonchi, normal respiratory effort. Cardiovascular system: normal S1/S2, RRR, o pedal edema.   Gastrointestinal system: soft, NT, ND, +bowel sounds. Central nervous  system: A&O x3. no gross focal neurologic deficits, normal speech Extremities: moves all, no edema, normal tone Psychiatry: normal mood, flat affect  Labs   Data Reviewed: I have personally reviewed following labs and imaging studies  CBC: Recent Labs  Lab 05/13/20 0645 05/17/20 0402 05/19/20 0446  WBC 6.9 8.2 11.4*  NEUTROABS 4.7 4.5 5.4  HGB 12.1* 12.1* 11.9*  HCT 35.7* 34.6* 35.2*  MCV 87.1 83.8 86.7  PLT 307 333 316   Basic Metabolic Panel: Recent Labs  Lab 05/13/20 0645 05/17/20 0402 05/19/20 0446  NA 138 137 139  K 4.1 3.8 4.0  CL 109 108 108  CO2 20* 20* 22  GLUCOSE 101* 88 98  BUN 23* 21* 19  CREATININE 1.20 1.15 1.13  CALCIUM 8.9 8.6* 8.8*  MG 2.0 2.1 2.2  PHOS 4.6  --   --    GFR: Estimated Creatinine Clearance:  72.2 mL/min (by C-G formula based on SCr of 1.13 mg/dL). Liver Function Tests: Recent Labs  Lab 05/13/20 0645  AST 15  ALT 22  ALKPHOS 79  BILITOT 0.6  PROT 6.3*  ALBUMIN 3.7   No results for input(s): LIPASE, AMYLASE in the last 168 hours. No results for input(s): AMMONIA in the last 168 hours. Coagulation Profile: No results for input(s): INR, PROTIME in the last 168 hours. Cardiac Enzymes: No results for input(s): CKTOTAL, CKMB, CKMBINDEX, TROPONINI in the last 168 hours. BNP (last 3 results) No results for input(s): PROBNP in the last 8760 hours. HbA1C: No results for input(s): HGBA1C in the last 72 hours. CBG: No results for input(s): GLUCAP in the last 168 hours. Lipid Profile: No results for input(s): CHOL, HDL, LDLCALC, TRIG, CHOLHDL, LDLDIRECT in the last 72 hours. Thyroid Function Tests: No results for input(s): TSH, T4TOTAL, FREET4, T3FREE, THYROIDAB in the last 72 hours. Anemia Panel: No results for input(s): VITAMINB12, FOLATE, FERRITIN, TIBC, IRON, RETICCTPCT in the last 72 hours. Sepsis Labs: No results for input(s): PROCALCITON, LATICACIDVEN in the last 168 hours.  No results found for this or any previous visit  (from the past 240 hour(s)).    Imaging Studies   No results found.   Medications   Scheduled Meds: . clonazepam  0.5 mg Oral BID  . enoxaparin (LOVENOX) injection  40 mg Subcutaneous Q24H  . haloperidol  2 mg Oral BID  . lamoTRIgine  175 mg Oral BID  . levETIRAcetam  1,500 mg Oral BID  . melatonin  5 mg Oral QHS  . OLANZapine  5 mg Oral QHS  . vitamin B-6  50 mg Oral Daily  . sodium chloride flush  10 mL Intravenous Q12H  . thiamine  100 mg Oral Daily  . vitamin B-12  1,000 mcg Oral Daily   Continuous Infusions:     LOS: 12 days    Time spent: 30 minutes    Pennie Banter, DO [facility]  05/19/2020, 8:09 PM    If 7PM-7AM, please contact night-coverage. How to contact the [facility] Attending or Consulting provider 7A - 7P or covering provider during after hours 7P -7A, for this patient?    1. Check the care team in [facility] and look for a) attending/consulting TRH provider listed and b) the [facility] team listed 2. Log into [email or web address] and use [place]'s universal password to access. If you do not have the password, please contact the hospital operator. 3. Locate the [facility] provider you are looking for under [facility] and page to a number that you can be directly reached. 4. If you still have difficulty reaching the provider, please page the [facility] (Director on Call) for the Hospitalists listed on amion for assistance.

## 2020-05-20 LAB — CBC WITH DIFFERENTIAL/PLATELET
Abs Immature Granulocytes: 0.18 10*3/uL — ABNORMAL HIGH (ref 0.00–0.07)
Basophils Absolute: 0.1 10*3/uL (ref 0.0–0.1)
Basophils Relative: 1 %
Eosinophils Absolute: 3.7 10*3/uL — ABNORMAL HIGH (ref 0.0–0.5)
Eosinophils Relative: 33 %
HCT: 36.4 % — ABNORMAL LOW (ref 39.0–52.0)
Hemoglobin: 12.2 g/dL — ABNORMAL LOW (ref 13.0–17.0)
Immature Granulocytes: 2 %
Lymphocytes Relative: 15 %
Lymphs Abs: 1.7 10*3/uL (ref 0.7–4.0)
MCH: 29.5 pg (ref 26.0–34.0)
MCHC: 33.5 g/dL (ref 30.0–36.0)
MCV: 87.9 fL (ref 80.0–100.0)
Monocytes Absolute: 0.6 10*3/uL (ref 0.1–1.0)
Monocytes Relative: 6 %
Neutro Abs: 4.9 10*3/uL (ref 1.7–7.7)
Neutrophils Relative %: 43 %
Platelets: 305 10*3/uL (ref 150–400)
RBC: 4.14 MIL/uL — ABNORMAL LOW (ref 4.22–5.81)
RDW: 13.7 % (ref 11.5–15.5)
WBC: 11.3 10*3/uL — ABNORMAL HIGH (ref 4.0–10.5)
nRBC: 0 % (ref 0.0–0.2)

## 2020-05-20 NOTE — Progress Notes (Signed)
PROGRESS NOTE    Lance Ray   OYD:[id number]  DOB: 02-08-1971  PCP: Patient, No Pcp Per    DOA: 05/07/2020 LOS: 13   Brief Narrative   HPI and Hospital Course as summarized in Dr. Eliane Decree note of 8/24:  " HPI: [name] is a 49 y.o. male with medical history significant for seizure disorder, chronic alcohol abuse, noncompliance with regimen of treatment of his seizures, who was admitted from behavioral health due to recurrent seizures.     Patient initally admitted on 03/24/20 under hospitalist service for generalized tonic clonic seizure due to non-compliance with Depakote. He was loaded with IV keppra and admitted to PCU. Had negative MRI brain and CT head. Hospital course was complicated by psychosis and agitation and required precedex gtt for agitation. Psychiatry was consulted and he was ultimately discharged to behavior health on 04/09/20 and was IVC'ed.  Hospitalist service was consulted by behavior health NP after sitter heard patient scream and witnessed a generalized tonic-clonic seizure that lasted about 2 minutes.  He was given 2 mg of Ativan IV and seen by neurology.     Currently on Lamictal 175 mg twice daily, Klonopin 0.5 mg daily, Keppra 1500 mg twice daily.     05/10/20: Seen and examined.  He is alert and oriented x4, responds to all questions appropriately.  Calm with no complaints.  Will obtain level of Keppra and Lamictal.  Neurology will reassess prior to DC.  Patient will benefit from home health RN to assist with management of his seizure medications.  TOC has been consulted to assist with home health services.     Patient denies any suicidal or homicidal ideation.   05/12/2020: Pt seen today for f/u is waiting for placement and is medically stable for d/c.  Pt denies any complaints.    05/13/20: Pt seen today and no change in condition vitals are stable and guardianship papers Pending for d/c.    05/14/20: Pt sen today he is stable doing well and  waiting for guardianship papers;   8/21: Pt is doing well and d/c pending guardianship paperwork.   8/22: Pt seen today for f/u no change and still pending social worker and d/c to snf.    8/23: Pr is stable waiting for placement. No complaints and is alert and awake .   8/24: Patient's clinical condition is same.  He is currently waiting for guardianship papers for discharge purposes.  Currently patient denies any complaints is eating ambulating and review of systems is otherwise negative.  Patient has been seizure-free so far."   I assumed care of patient on 8/25.  TOC has been unable to contact Guardian ad Litem.  Sister also unable to reach.  Awaiting guardianship hearing.    Per psychiatry, patient lacks capacity for decision-making. But he is no longer under IVC and does not meet criteria for inpatient psych at this time.  Assessment & Plan   Principal Problem:   Seizure [facility]) Active Problems:   Psychosis (HCC)   Breakthrough seizures, likely secondary to AEDs noncompliance- stable. AEDs adjusted by neurology Currently on Lamictal 175 mg twice daily, Klonopin 0.5 mg daily, Keppra 1500 mg twice daily. Continue seizure precautions IV Ativan as needed for active seizures greater than 3 minutes as recommended by neurology Neurology consulted Would benefit from home health RN to assist with his medications.   TOC has been consulted to assist with home health services and guardianship.    Seizure disorder Management per above  Obtaining Lamictal and Keppra levels, follow results. Cont current regimen of keppra and lamictal.   Agitation suspect related to alcohol withdrawal-resolved. Admitted from behavioral health No evidence of alcohol withdrawal at the time of this visit. Continue psych medications, multivitamins, thiamine and folic acid.  Seen by psych no need for inpatient psych   DVT prophylaxis: enoxaparin (LOVENOX) injection 40 mg Start: 05/08/20 0000   Diet:    Diet Orders (From admission, onward)    Start     Ordered   05/08/20 1110  Diet regular Room service appropriate? Yes; Fluid consistency: Thin  Diet effective now       Question Answer Comment  Room service appropriate? Yes   Fluid consistency: Thin      05/08/20 1109            Code Status: Full Code    Subjective 05/20/20    Patient seen at bedside this morning. Reports he feels well.  Denies acute complaints including fevers or chills, chest pain or shortness of breath, nausea vomiting or diarrhea.  Disposition Plan & Communication   Status is: Inpatient  Remains inpatient appropriate because: awaiting guardianship hearing.   Dispo:  Patient From: Home  Planned Disposition: ALF  Expected discharge date: pending guardianship hearing on Sept 7th  Medically stable for discharge: No    Family Communication: None at bedside, will attempt to call   Consults, Procedures, Significant Events   Consultants:   None  Procedures:   None  Antimicrobials:   None    Objective   Vitals:   05/19/20 1500 05/19/20 2004 05/19/20 2005 05/20/20 0438  BP: 99/64 99/66 (!) 93/58 93/66  Pulse: 77 81 87 78  Resp:  20  20  Temp: 98.9 F (37.2 C) 99 F (37.2 C)  98.3 F (36.8 C)  TempSrc:  Oral  Oral  SpO2:  97%  97%  Weight:      Height:       No intake or output data in the 24 hours ending 05/20/20 0813 Filed Weights   05/13/20 0513 05/17/20 0354 05/18/20 0434  Weight: 64.3 kg 64 kg 65.3 kg    Physical Exam:  General exam: awake, alert, no acute distress, frail Respiratory system: CTAB, normal respiratory effort. Cardiovascular system: normal S1/S2, RRR, no pedal edema.   Central nervous system: A&O x3. no gross focal neurologic deficits, normal speech Psychiatry: normal mood, flat affect  Labs   Data Reviewed: I have personally reviewed following labs and imaging studies  CBC: Recent Labs  Lab 05/17/20 0402 05/19/20 0446 05/20/20 0548  WBC 8.2  11.4* 11.3*  NEUTROABS 4.5 5.4 PENDING  HGB 12.1* 11.9* 12.2*  HCT 34.6* 35.2* 36.4*  MCV 83.8 86.7 87.9  PLT 333 316 305   Basic Metabolic Panel: Recent Labs  Lab 05/17/20 0402 05/19/20 0446  NA 137 139  K 3.8 4.0  CL 108 108  CO2 20* 22  GLUCOSE 88 98  BUN 21* 19  CREATININE 1.15 1.13  CALCIUM 8.6* 8.8*  MG 2.1 2.2   GFR: Estimated Creatinine Clearance: 72.2 mL/min (by C-G formula based on SCr of 1.13 mg/dL). Liver Function Tests: No results for input(s): AST, ALT, ALKPHOS, BILITOT, PROT, ALBUMIN in the last 168 hours. No results for input(s): LIPASE, AMYLASE in the last 168 hours. No results for input(s): AMMONIA in the last 168 hours. Coagulation Profile: No results for input(s): INR, PROTIME in the last 168 hours. Cardiac Enzymes: No results for input(s): CKTOTAL, CKMB, CKMBINDEX, TROPONINI  in the last 168 hours. BNP (last 3 results) No results for input(s): PROBNP in the last 8760 hours. HbA1C: No results for input(s): HGBA1C in the last 72 hours. CBG: No results for input(s): GLUCAP in the last 168 hours. Lipid Profile: No results for input(s): CHOL, HDL, LDLCALC, TRIG, CHOLHDL, LDLDIRECT in the last 72 hours. Thyroid Function Tests: No results for input(s): TSH, T4TOTAL, FREET4, T3FREE, THYROIDAB in the last 72 hours. Anemia Panel: No results for input(s): VITAMINB12, FOLATE, FERRITIN, TIBC, IRON, RETICCTPCT in the last 72 hours. Sepsis Labs: No results for input(s): PROCALCITON, LATICACIDVEN in the last 168 hours.  No results found for this or any previous visit (from the past 240 hour(s)).    Imaging Studies   No results found.   Medications   Scheduled Meds: . clonazepam  0.5 mg Oral BID  . enoxaparin (LOVENOX) injection  40 mg Subcutaneous Q24H  . haloperidol  2 mg Oral BID  . lamoTRIgine  175 mg Oral BID  . levETIRAcetam  1,500 mg Oral BID  . melatonin  5 mg Oral QHS  . OLANZapine  5 mg Oral QHS  . vitamin B-6  50 mg Oral Daily  . sodium  chloride flush  10 mL Intravenous Q12H  . thiamine  100 mg Oral Daily  . vitamin B-12  1,000 mcg Oral Daily   Continuous Infusions:     LOS: 13 days    Time spent: 15 minutes    Pennie Banter, DO [facility]  05/20/2020, 8:13 AM    If 7PM-7AM, please contact night-coverage. How to contact the [facility] Attending or Consulting provider 7A - 7P or covering provider during after hours 7P -7A, for this patient?    1. Check the care team in [facility] and look for a) attending/consulting TRH provider listed and b) the [facility] team listed 2. Log into [email or web address] and use [place]'s universal password to access. If you do not have the password, please contact the hospital operator. 3. Locate the [facility] provider you are looking for under [facility] and page to a number that you can be directly reached. 4. If you still have difficulty reaching the provider, please page the [facility] (Director on Call) for the Hospitalists listed on amion for assistance.

## 2020-05-20 NOTE — Progress Notes (Signed)
Verbal order given -  Ok for patient to take a shower.

## 2020-05-21 ENCOUNTER — Inpatient Hospital Stay: Payer: Medicaid Other

## 2020-05-21 LAB — CBC WITH DIFFERENTIAL/PLATELET
Abs Immature Granulocytes: 0.21 10*3/uL — ABNORMAL HIGH (ref 0.00–0.07)
Basophils Absolute: 0.1 10*3/uL (ref 0.0–0.1)
Basophils Relative: 1 %
Eosinophils Absolute: 4.3 10*3/uL — ABNORMAL HIGH (ref 0.0–0.5)
Eosinophils Relative: 35 %
HCT: 37.1 % — ABNORMAL LOW (ref 39.0–52.0)
Hemoglobin: 12.5 g/dL — ABNORMAL LOW (ref 13.0–17.0)
Immature Granulocytes: 2 %
Lymphocytes Relative: 12 %
Lymphs Abs: 1.4 10*3/uL (ref 0.7–4.0)
MCH: 29.3 pg (ref 26.0–34.0)
MCHC: 33.7 g/dL (ref 30.0–36.0)
MCV: 86.9 fL (ref 80.0–100.0)
Monocytes Absolute: 0.7 10*3/uL (ref 0.1–1.0)
Monocytes Relative: 5 %
Neutro Abs: 5.5 10*3/uL (ref 1.7–7.7)
Neutrophils Relative %: 45 %
Platelets: 286 10*3/uL (ref 150–400)
RBC: 4.27 MIL/uL (ref 4.22–5.81)
RDW: 13.7 % (ref 11.5–15.5)
WBC: 12.2 10*3/uL — ABNORMAL HIGH (ref 4.0–10.5)
nRBC: 0 % (ref 0.0–0.2)

## 2020-05-21 NOTE — Progress Notes (Addendum)
PROGRESS NOTE    Lance Ray   VXB:[id number]  DOB: 09-19-1971  PCP: Patient, No Pcp Per    DOA: 05/07/2020 LOS: 14   Brief Narrative   HPI and Hospital Course as summarized in Dr. Eliane Decree note of 8/24:  " HPI: Lance Ray is a 49 y.o. male with medical history significant for seizure disorder, chronic alcohol abuse, noncompliance with regimen of treatment of his seizures, who was admitted from behavioral health due to recurrent seizures.     Patient initally admitted on 03/24/20 under hospitalist service for generalized tonic clonic seizure due to non-compliance with Depakote. He was loaded with IV keppra and admitted to PCU. Had negative MRI brain and CT head. Hospital course was complicated by psychosis and agitation and required precedex gtt for agitation. Psychiatry was consulted and he was ultimately discharged to behavior health on 04/09/20 and was IVC'ed.  Hospitalist service was consulted by behavior health NP after sitter heard patient scream and witnessed a generalized tonic-clonic seizure that lasted about 2 minutes.  He was given 2 mg of Ativan IV and seen by neurology.     Currently on Lamictal 175 mg twice daily, Klonopin 0.5 mg daily, Keppra 1500 mg twice daily. ....   8/24: Patient's clinical condition is same.  He is currently waiting for guardianship papers for discharge purposes.  Currently patient denies any complaints is eating ambulating and review of systems is otherwise negative.  Patient has been seizure-free so far."   I assumed care of patient on 8/25.  TOC has been unable to contact Guardian ad Litem.  Sister also unable to reach.  Awaiting guardianship hearing which is reportedly scheduled for Sept 7th.    Per psychiatry, patient lacks capacity for decision-making. But he is no longer under IVC and does not meet criteria for inpatient psych at this time.  Assessment & Plan   Principal Problem:   Seizure (HCC) Active Problems:   Psychosis  (HCC)   Leukocytosis with Eosinophilia - not present on admission, mile.  Unclear cause at this time.   Pathologist's review: "The cause of the patient's eosinophilia is unclear from morphologic review alone....  A diagnosis of hypereosinophilic syndrome requires demonstration of persistent (greater than 1 month) eosinophilia, with end organ damage, and without an identifiable secondary cause.  Clinical correlation is recommended. " Differential includes allergic reaction, infections (especially parasitic), drug reaction, autoimmune conditions (sarcoidosis, rheumatoid arthritis, inflammatory bowel disease, IgG4 disease), inflammatory condition or some malignancies can also cause.   --Chest xray and blood culture for now for infectious evaluation --monitor CBC --monitor for lymphadenopathy, HSM, skin findings, respiratory symptoms --check IgG4, ACE   Breakthrough seizures, likely secondary to AEDs noncompliance- stable. AEDs adjusted by neurology Currently on Lamictal 175 mg twice daily, Klonopin 0.5 mg daily, Keppra 1500 mg twice daily. Continue seizure precautions IV Ativan as needed for active seizures greater than 3 minutes as recommended by neurology Neurology consulted Would benefit from home health RN to assist with his medications.   TOC has been consulted to assist with home health services and guardianship.    Seizure disorder Management per above Obtaining Lamictal and Keppra levels, follow results. Cont current regimen of keppra and lamictal.   Agitation suspect related to alcohol withdrawal-resolved. Admitted from behavioral health No evidence of alcohol withdrawal at the time of this visit. Continue psych medications, multivitamins, thiamine and folic acid.  Seen by psych no need for inpatient psych   DVT prophylaxis: enoxaparin (LOVENOX) injection 40 mg Start:  05/08/20 0000   Diet:  Diet Orders (From admission, onward)    Start     Ordered   05/08/20 1110  Diet  regular Room service appropriate? Yes; Fluid consistency: Thin  Diet effective now       Question Answer Comment  Room service appropriate? Yes   Fluid consistency: Thin      05/08/20 1109            Code Status: Full Code    Subjective 05/21/20    Patient seen at bedside this morning. Denies acute complaints and says he is feeling well.  No acute events reported.  Says he is anxious to be discharged.  Disposition Plan & Communication   Status is: Inpatient  Remains inpatient appropriate because: awaiting guardianship hearing.  Dispo:  Patient From: Home  Planned Disposition: ALF  Expected discharge date: pending guardianship hearing on Sept 7th  Medically stable for discharge: No    Family Communication: None at bedside, will attempt to call   Consults, Procedures, Significant Events   Consultants:   None  Procedures:   None  Antimicrobials:   None    Objective   Vitals:   05/20/20 1948 05/21/20 0517 05/21/20 0841 05/21/20 1257  BP: 96/69 94/64 101/75 103/71  Pulse: 88 73 70 77  Resp: 20 20 19 19   Temp: 98.6 F (37 C) 98.8 F (37.1 C) 98.2 F (36.8 C) 98.1 F (36.7 C)  TempSrc: Oral Oral Oral Oral  SpO2: 97% 97% 97% 97%  Weight:  64.6 kg    Height:        Intake/Output Summary (Last 24 hours) at 05/21/2020 1630 Last data filed at 05/21/2020 1429 Gross per 24 hour  Intake 240 ml  Output --  Net 240 ml   Filed Weights   05/17/20 0354 05/18/20 0434 05/21/20 0517  Weight: 64 kg 65.3 kg 64.6 kg    Physical Exam:  General exam: awake, alert, no acute distress, frail Respiratory system: CTAB, normal respiratory effort, no wheezes rales or rhonchi appreciated. Cardiovascular system: normal S1/S2, RRR, no pedal edema.   GI: soft, NT, ND, +bowel sounds, no HSM appreciated on palpation Central nervous system: A&O x3. no gross focal neurologic deficits, normal speech Psychiatry: normal mood, flat affect  Labs   Data Reviewed: I have  personally reviewed following labs and imaging studies  CBC: Recent Labs  Lab 05/17/20 0402 05/19/20 0446 05/20/20 0548 05/21/20 0958  WBC 8.2 11.4* 11.3* 12.2*  NEUTROABS 4.5 5.4 4.9 5.5  HGB 12.1* 11.9* 12.2* 12.5*  HCT 34.6* 35.2* 36.4* 37.1*  MCV 83.8 86.7 87.9 86.9  PLT 333 316 305 286   Basic Metabolic Panel: Recent Labs  Lab 05/17/20 0402 05/19/20 0446  NA 137 139  K 3.8 4.0  CL 108 108  CO2 20* 22  GLUCOSE 88 98  BUN 21* 19  CREATININE 1.15 1.13  CALCIUM 8.6* 8.8*  MG 2.1 2.2   GFR: Estimated Creatinine Clearance: 71.5 mL/min (by C-G formula based on SCr of 1.13 mg/dL). Liver Function Tests: No results for input(s): AST, ALT, ALKPHOS, BILITOT, PROT, ALBUMIN in the last 168 hours. No results for input(s): LIPASE, AMYLASE in the last 168 hours. No results for input(s): AMMONIA in the last 168 hours. Coagulation Profile: No results for input(s): INR, PROTIME in the last 168 hours. Cardiac Enzymes: No results for input(s): CKTOTAL, CKMB, CKMBINDEX, TROPONINI in the last 168 hours. BNP (last 3 results) No results for input(s): PROBNP in the last 8760  hours. HbA1C: No results for input(s): HGBA1C in the last 72 hours. CBG: No results for input(s): GLUCAP in the last 168 hours. Lipid Profile: No results for input(s): CHOL, HDL, LDLCALC, TRIG, CHOLHDL, LDLDIRECT in the last 72 hours. Thyroid Function Tests: No results for input(s): TSH, T4TOTAL, FREET4, T3FREE, THYROIDAB in the last 72 hours. Anemia Panel: No results for input(s): VITAMINB12, FOLATE, FERRITIN, TIBC, IRON, RETICCTPCT in the last 72 hours. Sepsis Labs: No results for input(s): PROCALCITON, LATICACIDVEN in the last 168 hours.  No results found for this or any previous visit (from the past 240 hour(s)).    Imaging Studies   No results found.   Medications   Scheduled Meds: . clonazepam  0.5 mg Oral BID  . enoxaparin (LOVENOX) injection  40 mg Subcutaneous Q24H  . haloperidol  2 mg Oral  BID  . lamoTRIgine  175 mg Oral BID  . levETIRAcetam  1,500 mg Oral BID  . melatonin  5 mg Oral QHS  . OLANZapine  5 mg Oral QHS  . vitamin B-6  50 mg Oral Daily  . sodium chloride flush  10 mL Intravenous Q12H  . thiamine  100 mg Oral Daily  . vitamin B-12  1,000 mcg Oral Daily   Continuous Infusions:     LOS: 14 days    Time spent: 20 minutes    Pennie Banter, DO [facility]  05/21/2020, 4:30 PM    If 7PM-7AM, please contact night-coverage. How to contact the [facility] Attending or Consulting provider 7A - 7P or covering provider during after hours 7P -7A, for this patient?    1. Check the care team in [facility] and look for a) attending/consulting TRH provider listed and b) the [facility] team listed 2. Log into [email or web address] and use [place]'s universal password to access. If you do not have the password, please contact the hospital operator. 3. Locate the [facility] provider you are looking for under [facility] and page to a number that you can be directly reached. 4. If you still have difficulty reaching the provider, please page the [facility] (Director on Call) for the Hospitalists listed on amion for assistance.

## 2020-05-22 LAB — BASIC METABOLIC PANEL
Anion gap: 10 (ref 5–15)
BUN: 28 mg/dL — ABNORMAL HIGH (ref 6–20)
CO2: 21 mmol/L — ABNORMAL LOW (ref 22–32)
Calcium: 8.9 mg/dL (ref 8.9–10.3)
Chloride: 107 mmol/L (ref 98–111)
Creatinine, Ser: 1.19 mg/dL (ref 0.61–1.24)
GFR calc Af Amer: >60 mL/min (ref >60)
GFR calc non Af Amer: >60 mL/min (ref >60)
Glucose, Bld: 88 mg/dL (ref 70–99)
Potassium: 4.2 mmol/L (ref 3.5–5.1)
Sodium: 138 mmol/L (ref 135–145)

## 2020-05-22 LAB — CBC WITH DIFFERENTIAL/PLATELET
Abs Immature Granulocytes: 0.14 10*3/uL — ABNORMAL HIGH (ref 0.00–0.07)
Basophils Absolute: 0.1 10*3/uL (ref 0.0–0.1)
Basophils Relative: 1 %
Eosinophils Absolute: 4.4 10*3/uL — ABNORMAL HIGH (ref 0.0–0.5)
Eosinophils Relative: 35 %
HCT: 37.2 % — ABNORMAL LOW (ref 39.0–52.0)
Hemoglobin: 12.7 g/dL — ABNORMAL LOW (ref 13.0–17.0)
Immature Granulocytes: 1 %
Lymphocytes Relative: 14 %
Lymphs Abs: 1.7 10*3/uL (ref 0.7–4.0)
MCH: 29 pg (ref 26.0–34.0)
MCHC: 34.1 g/dL (ref 30.0–36.0)
MCV: 84.9 fL (ref 80.0–100.0)
Monocytes Absolute: 0.8 10*3/uL (ref 0.1–1.0)
Monocytes Relative: 6 %
Neutro Abs: 5.5 10*3/uL (ref 1.7–7.7)
Neutrophils Relative %: 43 %
Platelets: 289 10*3/uL (ref 150–400)
RBC: 4.38 MIL/uL (ref 4.22–5.81)
RDW: 13.6 % (ref 11.5–15.5)
Smear Review: NORMAL
WBC: 12.6 10*3/uL — ABNORMAL HIGH (ref 4.0–10.5)
nRBC: 0 % (ref 0.0–0.2)

## 2020-05-22 LAB — VITAMIN D 25 HYDROXY (VIT D DEFICIENCY, FRACTURES): Vit D, 25-Hydroxy: 17.26 ng/mL — ABNORMAL LOW (ref 30–100)

## 2020-05-22 LAB — IGG 4: IgG, Subclass 4: 7 mg/dL (ref 2–96)

## 2020-05-22 LAB — ANGIOTENSIN CONVERTING ENZYME: Angiotensin-Converting Enzyme: 25 U/L (ref 14–82)

## 2020-05-22 MED ORDER — VITAMIN D 25 MCG (1000 UNIT) PO TABS
1000.0000 [IU] | ORAL_TABLET | Freq: Every day | ORAL | Status: DC
  Administered 2020-05-23 – 2020-08-12 (×82): 1000 [IU] via ORAL
  Filled 2020-05-22 (×82): qty 1

## 2020-05-22 NOTE — Progress Notes (Signed)
PROGRESS NOTE    Lance Ray   GYB:[id number]  DOB: [birth year]  PCP: Patient, No Pcp Per    DOA: 05/07/2020 LOS: 15   Brief Narrative   HPI and Hospital Course as summarized in Dr. Eliane Decree note of 8/24:  " HPI: Lance Ray is a 49 y.o. male with medical history significant for seizure disorder, chronic alcohol abuse, noncompliance with regimen of treatment of his seizures, who was admitted from behavioral health due to recurrent seizures.     Patient initally admitted on 03/24/20 under hospitalist service for generalized tonic clonic seizure due to non-compliance with Depakote. He was loaded with IV keppra and admitted to PCU. Had negative MRI brain and CT head. Hospital course was complicated by psychosis and agitation and required precedex gtt for agitation. Psychiatry was consulted and he was ultimately discharged to behavior health on 04/09/20 and was IVC'ed.  Hospitalist service was consulted by behavior health NP after sitter heard patient scream and witnessed a generalized tonic-clonic seizure that lasted about 2 minutes.  He was given 2 mg of Ativan IV and seen by neurology.     Currently on Lamictal 175 mg twice daily, Klonopin 0.5 mg daily, Keppra 1500 mg twice daily. ....   8/24: Patient's clinical condition is same.  He is currently waiting for guardianship papers for discharge purposes.  Currently patient denies any complaints is eating ambulating and review of systems is otherwise negative.  Patient has been seizure-free so far."   I assumed care of patient on 8/25.  TOC has been unable to contact Guardian ad Litem.  Sister also unable to reach.  Awaiting guardianship hearing which is reportedly scheduled for Sept 7th.    Per psychiatry, patient lacks capacity for decision-making. But he is no longer under IVC and does not meet criteria for inpatient psych at this time.  Assessment & Plan   Principal Problem:   Seizure (HCC) Active Problems:   Psychosis  (HCC)   Leukocytosis with Eosinophilia - not present on admission, mild.  Unclear etiology.   No clinical suspicion or evidence of infection at this time.   New on 8/25, was normal previously, WBC 11.4>>11.3>>12.2>>12.6. Pathologist's review: "The cause of the patient's eosinophilia is unclear from morphologic review alone....  A diagnosis of hypereosinophilic syndrome requires demonstration of persistent (greater than 1 month) eosinophilia, with end organ damage, and without an identifiable secondary cause.  Clinical correlation is recommended. "  Differential includes allergic reaction, infections (especially parasitic), drug reaction, autoimmune conditions (sarcoidosis, rheumatoid arthritis, inflammatory bowel disease, IgG4 disease), inflammatory condition or some malignancies can also cause.    He is without respiratory symptoms, skin findings, organomegaly or lymphadenopathy on exam. IgG4 is within normal limits. CXR on 8/27 showed low lung volumes, with right basilar atelectasis and bandlike opacity in the left midlung, atelectasis versus scarring.  Mediastinal contours were normal without hilar lymphadenopathy. --Follow blood culture --No urinary symptoms, will defer UA for now --monitor CBC --monitor for lymphadenopathy, HSM, skin findings, respiratory symptoms --Follow-up ACE level pending  Vitamin D deficiency -started on vitamin D3 1000 IU daily.  Recheck vitamin D level in about 3 months.  Breakthrough seizures, likely secondary to AEDs noncompliance- stable. AEDs adjusted by neurology Currently on Lamictal 175 mg twice daily, Klonopin 0.5 mg daily, Keppra 1500 mg twice daily. Continue seizure precautions IV Ativan as needed for active seizures greater than 3 minutes as recommended by neurology Neurology consulted Would benefit from home health RN to assist with his medications.  TOC has been consulted to assist with home health services and guardianship.   Need for  guardianship is barrier to discharge, hearing pending.  Seizure disorder Management per above Will recheck Lamictal and Keppra levels with AM labs. Cont current regimen of keppra and lamictal.   Agitation suspect related to alcohol withdrawal-resolved. Admitted from behavioral health No evidence of alcohol withdrawal at the time of this visit. Continue psych medications, multivitamins, thiamine and folic acid.  Seen by psych no need for inpatient psych   DVT prophylaxis: enoxaparin (LOVENOX) injection 40 mg Start: 05/08/20 0000   Diet:  Diet Orders (From admission, onward)    Start     Ordered   05/08/20 1110  Diet regular Room service appropriate? Yes; Fluid consistency: Thin  Diet effective now       Question Answer Comment  Room service appropriate? Yes   Fluid consistency: Thin      05/08/20 1109            Code Status: Full Code    Subjective 05/22/20    Patient seen at bedside this morning.  Reports that he feels well.  Denies fevers or chills, cough, congestion, sore throat, chest pain, dysuria or urinary frequency, abdominal pain nausea vomiting or diarrhea.  Disposition Plan & Communication   Status is: Inpatient  Remains inpatient appropriate because: awaiting guardianship hearing.  Dispo:  Patient From: Home  Planned Disposition: ALF  Expected discharge date: pending guardianship hearing on Sept 7th  Medically stable for discharge: No    Family Communication: None at bedside, will attempt to call   Consults, Procedures, Significant Events   Consultants:   None  Procedures:   None  Antimicrobials:   None    Objective   Vitals:   05/21/20 1713 05/21/20 2022 05/22/20 0356 05/22/20 0731  BP: 102/70 98/65 94/63  100/68  Pulse: 81 84 72 70  Resp: 20 20 16 18   Temp: 98.7 F (37.1 C) 98.7 F (37.1 C) 98.5 F (36.9 C) 98.1 F (36.7 C)  TempSrc: Oral  Oral Oral  SpO2: 97% 97% 98% 99%  Weight:      Height:        Intake/Output  Summary (Last 24 hours) at 05/22/2020 0833 Last data filed at 05/22/2020 0731 Gross per 24 hour  Intake 240 ml  Output 0 ml  Net 240 ml   Filed Weights   05/17/20 0354 05/18/20 0434 05/21/20 0517  Weight: 64 kg 65.3 kg 64.6 kg    Physical Exam:  General exam: awake, alert, no acute distress, frail Respiratory system: CTAB, normal respiratory effort, no wheezes rales or rhonchi appreciated. Cardiovascular system: normal S1/S2, RRR, no pedal edema.   Immunologic: No cervical, supraclavicular, axillary or inguinal lymphadenopathy appreciated on palpation Psychiatry: normal mood, flat affect  Labs   Data Reviewed: I have personally reviewed following labs and imaging studies  CBC: Recent Labs  Lab 05/17/20 0402 05/19/20 0446 05/20/20 0548 05/21/20 0958 05/22/20 0626  WBC 8.2 11.4* 11.3* 12.2* 12.6*  NEUTROABS 4.5 5.4 4.9 5.5 5.5  HGB 12.1* 11.9* 12.2* 12.5* 12.7*  HCT 34.6* 35.2* 36.4* 37.1* 37.2*  MCV 83.8 86.7 87.9 86.9 84.9  PLT 333 316 305 286 289   Basic Metabolic Panel: Recent Labs  Lab 05/17/20 0402 05/19/20 0446 05/22/20 0626  NA 137 139 138  K 3.8 4.0 4.2  CL 108 108 107  CO2 20* 22 21*  GLUCOSE 88 98 88  BUN 21* 19 28*  CREATININE 1.15 1.13 1.19  CALCIUM 8.6* 8.8* 8.9  MG 2.1 2.2  --    GFR: Estimated Creatinine Clearance: 67.9 mL/min (by C-G formula based on SCr of 1.19 mg/dL). Liver Function Tests: No results for input(s): AST, ALT, ALKPHOS, BILITOT, PROT, ALBUMIN in the last 168 hours. No results for input(s): LIPASE, AMYLASE in the last 168 hours. No results for input(s): AMMONIA in the last 168 hours. Coagulation Profile: No results for input(s): INR, PROTIME in the last 168 hours. Cardiac Enzymes: No results for input(s): CKTOTAL, CKMB, CKMBINDEX, TROPONINI in the last 168 hours. BNP (last 3 results) No results for input(s): PROBNP in the last 8760 hours. HbA1C: No results for input(s): HGBA1C in the last 72 hours. CBG: No results for  input(s): GLUCAP in the last 168 hours. Lipid Profile: No results for input(s): CHOL, HDL, LDLCALC, TRIG, CHOLHDL, LDLDIRECT in the last 72 hours. Thyroid Function Tests: No results for input(s): TSH, T4TOTAL, FREET4, T3FREE, THYROIDAB in the last 72 hours. Anemia Panel: No results for input(s): VITAMINB12, FOLATE, FERRITIN, TIBC, IRON, RETICCTPCT in the last 72 hours. Sepsis Labs: No results for input(s): PROCALCITON, LATICACIDVEN in the last 168 hours.  No results found for this or any previous visit (from the past 240 hour(s)).    Imaging Studies   DG Chest Port 1 View  Result Date: 05/21/2020 CLINICAL DATA:  Leukocytosis. EXAM: PORTABLE CHEST 1 VIEW COMPARISON:  12/05/2016 FINDINGS: Bandlike opacity in the left mid lung and streaky opacities in the right lung base typical of atelectasis. No confluent airspace disease or pneumonia. Lung volumes are low. Heart is normal in size with normal mediastinal contours. No pulmonary edema. No pleural fluid or pneumothorax. Mild scoliotic curvature of the thoracic spine. No acute osseous abnormalities are seen. Possible remote left clavicle fracture, unchanged. IMPRESSION: Low lung volumes with right basilar atelectasis and bandlike opacity in the left mid lung, atelectasis versus scarring. Electronically Signed   By: Narda Rutherford M.D.   On: 05/21/2020 18:13     Medications   Scheduled Meds: . clonazepam  0.5 mg Oral BID  . enoxaparin (LOVENOX) injection  40 mg Subcutaneous Q24H  . haloperidol  2 mg Oral BID  . lamoTRIgine  175 mg Oral BID  . levETIRAcetam  1,500 mg Oral BID  . melatonin  5 mg Oral QHS  . OLANZapine  5 mg Oral QHS  . vitamin B-6  50 mg Oral Daily  . sodium chloride flush  10 mL Intravenous Q12H  . thiamine  100 mg Oral Daily  . vitamin B-12  1,000 mcg Oral Daily   Continuous Infusions:     LOS: 15 days    Time spent: 20 minutes    Pennie Banter, DO [facility]  05/22/2020, 8:33 AM    If  7PM-7AM, please contact night-coverage. How to contact the [facility] Attending or Consulting provider 7A - 7P or covering provider during after hours 7P -7A, for this patient?    1. Check the care team in [facility] and look for a) attending/consulting TRH provider listed and b) the [facility] team listed 2. Log into [email or web address] and use [place]'s universal password to access. If you do not have the password, please contact the hospital operator. 3. Locate the [facility] provider you are looking for under [facility] and page to a number that you can be directly reached. 4. If you still have difficulty reaching the provider, please page the [facility] (Director on Call) for the Hospitalists listed on amion for assistance.

## 2020-05-23 LAB — CBC WITH DIFFERENTIAL/PLATELET
Abs Immature Granulocytes: 0.11 10*3/uL — ABNORMAL HIGH (ref 0.00–0.07)
Basophils Absolute: 0.1 10*3/uL (ref 0.0–0.1)
Basophils Relative: 1 %
Eosinophils Absolute: 2.3 10*3/uL — ABNORMAL HIGH (ref 0.0–0.5)
Eosinophils Relative: 25 %
HCT: 36.9 % — ABNORMAL LOW (ref 39.0–52.0)
Hemoglobin: 12.4 g/dL — ABNORMAL LOW (ref 13.0–17.0)
Immature Granulocytes: 1 %
Lymphocytes Relative: 16 %
Lymphs Abs: 1.5 10*3/uL (ref 0.7–4.0)
MCH: 29.4 pg (ref 26.0–34.0)
MCHC: 33.6 g/dL (ref 30.0–36.0)
MCV: 87.4 fL (ref 80.0–100.0)
Monocytes Absolute: 0.7 10*3/uL (ref 0.1–1.0)
Monocytes Relative: 7 %
Neutro Abs: 4.7 10*3/uL (ref 1.7–7.7)
Neutrophils Relative %: 50 %
Platelets: 277 10*3/uL (ref 150–400)
RBC: 4.22 MIL/uL (ref 4.22–5.81)
RDW: 13.6 % (ref 11.5–15.5)
Smear Review: NORMAL
WBC: 9.3 10*3/uL (ref 4.0–10.5)
nRBC: 0 % (ref 0.0–0.2)

## 2020-05-23 LAB — SEDIMENTATION RATE: Sed Rate: 11 mm/hr (ref 0–20)

## 2020-05-23 LAB — C-REACTIVE PROTEIN: CRP: 0.6 mg/dL (ref <1.0)

## 2020-05-23 NOTE — Progress Notes (Addendum)
PROGRESS NOTE    Lance Ray   BZJ:[id number]  DOB: 16-Jun-1971  PCP: Patient, No Pcp Per    DOA: 05/07/2020 LOS: 16   Brief Narrative   HPI and Hospital Course as summarized in Dr. Eliane Decree note of 8/24:  " HPI: Lance Ray is a 49 y.o. male with medical history significant for dementia, psychosis, seizure disorder, chronic alcohol abuse, noncompliance with regimen of treatment of his seizures, who was admitted from behavioral health unit due to recurrent seizures.     Patient initally admitted on 03/24/20 under hospitalist service for generalized tonic clonic seizure due to non-compliance with Depakote. He was loaded with IV keppra and admitted to PCU. Had negative MRI brain and CT head. Hospital course was complicated by psychosis and agitation and required precedex gtt for agitation. Psychiatry was consulted and he was ultimately discharged to behavior health on 04/09/20 and was IVC'ed.  Hospitalist service was consulted by behavior health NP after sitter heard patient scream and witnessed a generalized tonic-clonic seizure that lasted about 2 minutes.  He was given 2 mg of Ativan IV and seen by neurology.     Currently on Lamictal 175 mg twice daily, Klonopin 0.5 mg daily, Keppra 1500 mg twice daily.   I assumed care of patient on 8/25.  TOC has been unable to contact Guardian ad Litem.  Sister also unable to reach.  Awaiting guardianship hearing which is reportedly scheduled for Sept 7th.    Per psychiatry, patient lacks capacity for decision-making, requires a guardian. He is no longer under IVC and does not meet criteria for inpatient psych at this time.  Assessment & Plan   Principal Problem:   Seizure (HCC) Active Problems:   Psychosis (HCC)   Leukocytosis with Eosinophilia - not present on admission, mild.  Resolved.   Unclear etiology.  No clinical suspicion or evidence of infection.   New on 8/25, was normal previously, WBC 11.4>>11.3>>12.2>>12.6  >>9.3.  Pathologist's review: "The cause of the patient's eosinophilia is unclear from morphologic review alone....  A diagnosis of hypereosinophilic syndrome requires demonstration of persistent (greater than 1 month) eosinophilia, with end organ damage, and without an identifiable secondary cause.  Clinical correlation is recommended. " Differential includes allergic reaction, infections (especially parasitic), drug reaction, autoimmune conditions (sarcoidosis, rheumatoid arthritis, inflammatory bowel disease, IgG4 disease), inflammatory condition or some malignancies can also cause.    He is without respiratory symptoms, skin findings, organomegaly or lymphadenopathy on exam. IgG4 is within normal limits. CXR on 8/27 showed low lung volumes, with right basilar atelectasis and bandlike opacity in the left midlung, atelectasis versus scarring.  Mediastinal contours were normal without hilar lymphadenopathy. --Follow blood culture --Follow-up ACE level pending    Vitamin D deficiency -started on vitamin D3 1000 IU daily.  Recheck vitamin D level in about 3 months.  Breakthrough seizures, likely secondary to AEDs noncompliance- stable. AEDs adjusted by neurology Currently on Lamictal 175 mg twice daily, Klonopin 0.5 mg daily, Keppra 1500 mg twice daily. Continue seizure precautions IV Ativan as needed for active seizures greater than 3 minutes as recommended by neurology Neurology consulted Would benefit from home health RN to assist with his medications.   TOC has been consulted to assist with home health services and guardianship.   Need for guardianship is barrier to discharge, hearing pending.  Seizure disorder Management per above Will recheck Lamictal and Keppra levels with AM labs. Cont current regimen of keppra and lamictal.   Agitation suspect related to alcohol  withdrawal-resolved. Admitted from behavioral health No evidence of alcohol withdrawal at the time of this  visit. Continue psych medications, multivitamins, thiamine and folic acid.  Seen by psych no need for inpatient psych   DVT prophylaxis: enoxaparin (LOVENOX) injection 40 mg Start: 05/08/20 0000   Diet:  Diet Orders (From admission, onward)    Start     Ordered   05/08/20 1110  Diet regular Room service appropriate? Yes; Fluid consistency: Thin  Diet effective now       Question Answer Comment  Room service appropriate? Yes   Fluid consistency: Thin      05/08/20 1109            Code Status: Full Code    Subjective 05/23/20    Patient seen at bedside this morning.  Reports feeling well.  Wants to get out of here.  Denies fever/chills, CP, SOB, cough, N/V/D or other complaints.   Disposition Plan & Communication   Status is: Inpatient  Remains inpatient appropriate because: awaiting guardianship hearing.  Dispo:  Patient From: Home  Planned Disposition: ALF  Expected discharge date: pending guardianship hearing on Sept 7th  Medically stable for discharge: No    Family Communication: None at bedside, will attempt to call   Consults, Procedures, Significant Events   Consultants:   None  Procedures:   None  Antimicrobials:   None    Objective   Vitals:   05/22/20 2039 05/23/20 0622 05/23/20 0802 05/23/20 1155  BP: 104/74 91/69 100/70 94/67  Pulse: 87 70 67 79  Resp: 20 20 18 18   Temp: 98.4 F (36.9 C) 98.5 F (36.9 C) 98.2 F (36.8 C) 97.6 F (36.4 C)  TempSrc: Oral Oral Oral   SpO2: 99% 96% 97% 98%  Weight:  64.5 kg    Height:        Intake/Output Summary (Last 24 hours) at 05/23/2020 1631 Last data filed at 05/23/2020 1345 Gross per 24 hour  Intake 720 ml  Output 0 ml  Net 720 ml   Filed Weights   05/18/20 0434 05/21/20 0517 05/23/20 0622  Weight: 65.3 kg 64.6 kg 64.5 kg    Physical Exam:  General exam: awake, alert, no acute distress, frail Respiratory system: CTAB, normal respiratory effort, no wheezes rales or rhonchi  appreciated. Cardiovascular system: normal S1/S2, RRR, no pedal edema.   Psychiatry: normal mood, flat affect  Labs   Data Reviewed: I have personally reviewed following labs and imaging studies  CBC: Recent Labs  Lab 05/19/20 0446 05/20/20 0548 05/21/20 0958 05/22/20 0626 05/23/20 0537  WBC 11.4* 11.3* 12.2* 12.6* 9.3  NEUTROABS 5.4 4.9 5.5 5.5 4.7  HGB 11.9* 12.2* 12.5* 12.7* 12.4*  HCT 35.2* 36.4* 37.1* 37.2* 36.9*  MCV 86.7 87.9 86.9 84.9 87.4  PLT 316 305 286 289 277   Basic Metabolic Panel: Recent Labs  Lab 05/17/20 0402 05/19/20 0446 05/22/20 0626  NA 137 139 138  K 3.8 4.0 4.2  CL 108 108 107  CO2 20* 22 21*  GLUCOSE 88 98 88  BUN 21* 19 28*  CREATININE 1.15 1.13 1.19  CALCIUM 8.6* 8.8* 8.9  MG 2.1 2.2  --    GFR: Estimated Creatinine Clearance: 67.8 mL/min (by C-G formula based on SCr of 1.19 mg/dL). Liver Function Tests: No results for input(s): AST, ALT, ALKPHOS, BILITOT, PROT, ALBUMIN in the last 168 hours. No results for input(s): LIPASE, AMYLASE in the last 168 hours. No results for input(s): AMMONIA in the last 168  hours. Coagulation Profile: No results for input(s): INR, PROTIME in the last 168 hours. Cardiac Enzymes: No results for input(s): CKTOTAL, CKMB, CKMBINDEX, TROPONINI in the last 168 hours. BNP (last 3 results) No results for input(s): PROBNP in the last 8760 hours. HbA1C: No results for input(s): HGBA1C in the last 72 hours. CBG: No results for input(s): GLUCAP in the last 168 hours. Lipid Profile: No results for input(s): CHOL, HDL, LDLCALC, TRIG, CHOLHDL, LDLDIRECT in the last 72 hours. Thyroid Function Tests: No results for input(s): TSH, T4TOTAL, FREET4, T3FREE, THYROIDAB in the last 72 hours. Anemia Panel: No results for input(s): VITAMINB12, FOLATE, FERRITIN, TIBC, IRON, RETICCTPCT in the last 72 hours. Sepsis Labs: No results for input(s): PROCALCITON, LATICACIDVEN in the last 168 hours.  Recent Results (from the past  240 hour(s))  Culture, blood (single) w Reflex to ID Panel     Status: None (Preliminary result)   Collection Time: 05/21/20  5:06 PM   Specimen: BLOOD  Result Value Ref Range Status   Specimen Description BLOOD RIGHT [facility]  Final   Special Requests   Final    BOTTLES DRAWN AEROBIC AND ANAEROBIC Blood Culture adequate volume   Culture   Final    NO GROWTH 2 DAYS Performed at [facility], [address] [place], [place] [address]    Report Status PENDING  Incomplete      Imaging Studies   DG Chest Port 1 View  Result Date: 05/21/2020 CLINICAL DATA:  Leukocytosis. EXAM: PORTABLE CHEST 1 VIEW COMPARISON:  12/05/2016 FINDINGS: Bandlike opacity in the left mid lung and streaky opacities in the right lung base typical of atelectasis. No confluent airspace disease or pneumonia. Lung volumes are low. Heart is normal in size with normal mediastinal contours. No pulmonary edema. No pleural fluid or pneumothorax. Mild scoliotic curvature of the thoracic spine. No acute osseous abnormalities are seen. Possible remote left clavicle fracture, unchanged. IMPRESSION: Low lung volumes with right basilar atelectasis and bandlike opacity in the left mid lung, atelectasis versus scarring. Electronically Signed   By: Narda Rutherford M.D.   On: 05/21/2020 18:13     Medications   Scheduled Meds: . cholecalciferol  1,000 Units Oral Daily  . clonazepam  0.5 mg Oral BID  . enoxaparin (LOVENOX) injection  40 mg Subcutaneous Q24H  . haloperidol  2 mg Oral BID  . lamoTRIgine  175 mg Oral BID  . levETIRAcetam  1,500 mg Oral BID  . melatonin  5 mg Oral QHS  . OLANZapine  5 mg Oral QHS  . vitamin B-6  50 mg Oral Daily  . sodium chloride flush  10 mL Intravenous Q12H  . thiamine  100 mg Oral Daily  . vitamin B-12  1,000 mcg Oral Daily   Continuous Infusions:     LOS: 16 days    Time spent: 15 minutes    Pennie Banter, DO [facility]  05/23/2020, 4:31 PM    If 7PM-7AM,  please contact night-coverage. How to contact the [facility] Attending or Consulting provider 7A - 7P or covering provider during after hours 7P -7A, for this patient?    1. Check the care team in [facility] and look for a) attending/consulting TRH provider listed and b) the [facility] team listed 2. Log into [email or web address] and use [facility]'s universal password to access. If you do not have the password, please contact the hospital operator. 3. Locate the [facility] provider you are looking for under [facility] and page to a number  that you can be directly reached. 4. If you still have difficulty reaching the provider, please page the [facility] (Director on Call) for the Hospitalists listed on amion for assistance.

## 2020-05-24 LAB — CBC WITH DIFFERENTIAL/PLATELET
Abs Immature Granulocytes: 0.06 10*3/uL (ref 0.00–0.07)
Basophils Absolute: 0.1 10*3/uL (ref 0.0–0.1)
Basophils Relative: 1 %
Eosinophils Absolute: 1.4 10*3/uL — ABNORMAL HIGH (ref 0.0–0.5)
Eosinophils Relative: 17 %
HCT: 37.9 % — ABNORMAL LOW (ref 39.0–52.0)
Hemoglobin: 12.6 g/dL — ABNORMAL LOW (ref 13.0–17.0)
Immature Granulocytes: 1 %
Lymphocytes Relative: 18 %
Lymphs Abs: 1.5 10*3/uL (ref 0.7–4.0)
MCH: 29.2 pg (ref 26.0–34.0)
MCHC: 33.2 g/dL (ref 30.0–36.0)
MCV: 87.9 fL (ref 80.0–100.0)
Monocytes Absolute: 0.6 10*3/uL (ref 0.1–1.0)
Monocytes Relative: 7 %
Neutro Abs: 4.8 10*3/uL (ref 1.7–7.7)
Neutrophils Relative %: 56 %
Platelets: 257 10*3/uL (ref 150–400)
RBC: 4.31 MIL/uL (ref 4.22–5.81)
RDW: 13.5 % (ref 11.5–15.5)
WBC: 8.4 10*3/uL (ref 4.0–10.5)
nRBC: 0 % (ref 0.0–0.2)

## 2020-05-24 NOTE — Progress Notes (Signed)
PROGRESS NOTE    Lance Ray   DVV:[id number]  DOB: September 13, 1971  PCP: Patient, No Pcp Per    DOA: 05/07/2020 LOS: 17   Brief Narrative   Lance Ray is a 49 y.o. male with medical history significant for dementia, psychosis, seizure disorder, chronic alcohol abuse, noncompliance with regimen of treatment of his seizures, who was admitted from behavioral health unit due to recurrent seizures.     Patient initally admitted on 03/24/20 under hospitalist service for generalized tonic clonic seizure due to non-compliance with Depakote. He was loaded with IV keppra and admitted to PCU. Had negative MRI brain and CT head. Hospital course was complicated by psychosis and agitation and required precedex gtt for agitation. Psychiatry was consulted and he was ultimately discharged to behavior health on 04/09/20 and was IVC'ed.  Hospitalist service was consulted by behavior health NP after sitter heard patient scream and witnessed a generalized tonic-clonic seizure that lasted about 2 minutes.  He was given 2 mg of Ativan IV and seen by neurology.     Currently on Lamictal 175 mg twice daily, Klonopin 0.5 mg daily, Keppra 1500 mg twice daily  TOC has been unable to contact Guardian ad Litem.  Sister also unable to reach.  Awaiting guardianship hearing which is reportedly scheduled for Sept 7th.     Per psychiatry, patient lacks capacity for decision-making, requires a guardian. He is no longer under IVC and does not meet criteria for inpatient psych at this time.  I assumed care of patient on 8/25.  He has remained medically stable, without signs of psychosis and compliant with his care over this week.    Assessment & Plan   Principal Problem:   Seizure (HCC) Active Problems:   Psychosis (HCC)  Vitamin D deficiency -started on vitamin D3 1000 IU daily.  Recheck vitamin D level in about 3 months.  Breakthrough seizures, likely secondary to AEDs noncompliance- stable. AEDs adjusted by  neurology Currently on Lamictal 175 mg twice daily, Klonopin 0.5 mg daily, Keppra 1500 mg twice daily. Continue seizure precautions IV Ativan as needed for active seizures greater than 3 minutes as recommended by neurology Neurology consulted Would benefit from home health RN to assist with his medications.   TOC has been consulted to assist with home health services and guardianship.   Need for guardianship is barrier to discharge, hearing pending.  Seizure disorder Management per above Will recheck Lamictal and Keppra levels with AM labs. Cont current regimen of keppra and lamictal.    Leukocytosis with Eosinophilia - not present on admission, mild.  Resolved.   Unclear etiology.  No clinical suspicion or evidence of infection.   New on 8/25, was normal previously, WBC 11.4>>11.3>>12.2>>12.6 >>9.3. Differential includes allergic reaction, infections (especially parasitic), drug reaction, autoimmune conditions (sarcoidosis, rheumatoid arthritis, inflammatory bowel disease, IgG4 disease), inflammatory condition or some malignancies can also cause.   He is without respiratory symptoms, skin findings, organomegaly or lymphadenopathy on exam. IgG4 is within normal limits. CXR on 8/27 showed low lung volumes, with right basilar atelectasis and bandlike opacity in the left midlung, atelectasis versus scarring.  Mediastinal contours were normal without hilar lymphadenopathy. --Follow blood culture & ACE level  Agitation suspect related to alcohol withdrawal-resolved. Admitted from behavioral health No evidence of alcohol withdrawal at the time of this visit. Continue psych medications, multivitamins, thiamine and folic acid.  Seen by psych no need for inpatient psych   DVT prophylaxis: enoxaparin (LOVENOX) injection 40 mg Start: 05/08/20 0000  Diet:  Diet Orders (From admission, onward)    Start     Ordered   05/08/20 1110  Diet regular Room service appropriate? Yes; Fluid  consistency: Thin  Diet effective now       Question Answer Comment  Room service appropriate? Yes   Fluid consistency: Thin      05/08/20 1109            Code Status: Full Code    Subjective 05/24/20    Patient seen at bedside this morning.  Reports feeling well.  Says he wants to go home.  Denies acute complaints including fever/chills, CP, SOB, cough, N/V/D or other complaints.   Disposition Plan & Communication   Status is: Inpatient  Remains inpatient appropriate because: awaiting guardianship hearing.  Dispo:  Patient From: Home  Planned Disposition: ALF  Expected discharge date: pending guardianship hearing on Sept 7th  Medically stable for discharge: No    Family Communication: None at bedside, will attempt to call   Consults, Procedures, Significant Events   Consultants:   None  Procedures:   None  Antimicrobials:   None    Objective   Vitals:   05/23/20 2009 05/23/20 2010 05/24/20 0548 05/24/20 0755  BP: 97/68 (!) 108/94 99/73 96/73   Pulse: 87 85 73 70  Resp: 20  20 18   Temp: 98.7 F (37.1 C)  98.6 F (37 C) 98.1 F (36.7 C)  TempSrc: Oral  Oral Oral  SpO2: 96% 96% 97% 97%  Weight:      Height:        Intake/Output Summary (Last 24 hours) at 05/24/2020 0825 Last data filed at 05/24/2020 0549 Gross per 24 hour  Intake 960 ml  Output 450 ml  Net 510 ml   Filed Weights   05/18/20 0434 05/21/20 0517 05/23/20 0622  Weight: 65.3 kg 64.6 kg 64.5 kg    Physical Exam:  General exam: awake, alert, no acute distress, frail Respiratory system: CTAB, normal respiratory effort, no wheezes rales or rhonchi appreciated. Cardiovascular system: normal S1/S2, RRR, no pedal edema.   Psychiatry: normal mood, flat affect  Labs   Data Reviewed: I have personally reviewed following labs and imaging studies  CBC: Recent Labs  Lab 05/20/20 0548 05/21/20 0958 05/22/20 0626 05/23/20 0537 05/24/20 0448  WBC 11.3* 12.2* 12.6* 9.3 8.4    NEUTROABS 4.9 5.5 5.5 4.7 4.8  HGB 12.2* 12.5* 12.7* 12.4* 12.6*  HCT 36.4* 37.1* 37.2* 36.9* 37.9*  MCV 87.9 86.9 84.9 87.4 87.9  PLT 305 286 289 277 257   Basic Metabolic Panel: Recent Labs  Lab 05/19/20 0446 05/22/20 0626  NA 139 138  K 4.0 4.2  CL 108 107  CO2 22 21*  GLUCOSE 98 88  BUN 19 28*  CREATININE 1.13 1.19  CALCIUM 8.8* 8.9  MG 2.2  --    GFR: Estimated Creatinine Clearance: 67.8 mL/min (by C-G formula based on SCr of 1.19 mg/dL). Liver Function Tests: No results for input(s): AST, ALT, ALKPHOS, BILITOT, PROT, ALBUMIN in the last 168 hours. No results for input(s): LIPASE, AMYLASE in the last 168 hours. No results for input(s): AMMONIA in the last 168 hours. Coagulation Profile: No results for input(s): INR, PROTIME in the last 168 hours. Cardiac Enzymes: No results for input(s): CKTOTAL, CKMB, CKMBINDEX, TROPONINI in the last 168 hours. BNP (last 3 results) No results for input(s): PROBNP in the last 8760 hours. HbA1C: No results for input(s): HGBA1C in the last 72 hours. CBG: No results  for input(s): GLUCAP in the last 168 hours. Lipid Profile: No results for input(s): CHOL, HDL, LDLCALC, TRIG, CHOLHDL, LDLDIRECT in the last 72 hours. Thyroid Function Tests: No results for input(s): TSH, T4TOTAL, FREET4, T3FREE, THYROIDAB in the last 72 hours. Anemia Panel: No results for input(s): VITAMINB12, FOLATE, FERRITIN, TIBC, IRON, RETICCTPCT in the last 72 hours. Sepsis Labs: No results for input(s): PROCALCITON, LATICACIDVEN in the last 168 hours.  Recent Results (from the past 240 hour(s))  Culture, blood (single) w Reflex to ID Panel     Status: None (Preliminary result)   Collection Time: 05/21/20  5:06 PM   Specimen: BLOOD  Result Value Ref Range Status   Specimen Description BLOOD RIGHT [facility]  Final   Special Requests   Final    BOTTLES DRAWN AEROBIC AND ANAEROBIC Blood Culture adequate volume   Culture   Final    NO GROWTH 3 DAYS Performed at  [facility], [address], [place], [place] [address]    Report Status PENDING  Incomplete      Imaging Studies   No results found.   Medications   Scheduled Meds: . cholecalciferol  1,000 Units Oral Daily  . clonazepam  0.5 mg Oral BID  . enoxaparin (LOVENOX) injection  40 mg Subcutaneous Q24H  . haloperidol  2 mg Oral BID  . lamoTRIgine  175 mg Oral BID  . levETIRAcetam  1,500 mg Oral BID  . melatonin  5 mg Oral QHS  . OLANZapine  5 mg Oral QHS  . vitamin B-6  50 mg Oral Daily  . sodium chloride flush  10 mL Intravenous Q12H  . thiamine  100 mg Oral Daily  . vitamin B-12  1,000 mcg Oral Daily   Continuous Infusions:     LOS: 17 days    Time spent: 15 minutes    Pennie Banter, DO [facility]  05/24/2020, 8:25 AM    If 7PM-7AM, please contact night-coverage. How to contact the [facility] Attending or Consulting provider 7A - 7P or covering provider during after hours 7P -7A, for this patient?    1. Check the care team in [facility] and look for a) attending/consulting TRH provider listed and b) the [facility] team listed 2. Log into [email or web address] and use [facility]'s universal password to access. If you do not have the password, please contact the hospital operator. 3. Locate the [facility] provider you are looking for under [facility] and page to a number that you can be directly reached. 4. If you still have difficulty reaching the provider, please page the [facility] (Director on Call) for the Hospitalists listed on amion for assistance.

## 2020-05-25 LAB — LAMOTRIGINE LEVEL: Lamotrigine Lvl: 5.6 ug/mL (ref 2.0–20.0)

## 2020-05-25 NOTE — Progress Notes (Signed)
PROGRESS NOTE    Lance Ray   QJJ:[id number]  DOB: 1971/05/22  PCP: Patient, No Pcp Per    DOA: 05/07/2020 LOS: 18   Brief Narrative   Lance Ray is a 49 y.o. male with medical history significant for dementia, psychosis, seizure disorder, chronic alcohol abuse, noncompliance with regimen of treatment of his seizures, who was admitted from behavioral health unit due to recurrent seizures.     Patient initally admitted on 03/24/20 under hospitalist service for generalized tonic clonic seizure due to non-compliance with Depakote. He was loaded with IV keppra and admitted to PCU. Had negative MRI brain and CT head. Hospital course was complicated by psychosis and agitation and required precedex gtt for agitation. Psychiatry was consulted and he was ultimately discharged to behavior health on 04/09/20 and was IVC'ed.  Hospitalist service was consulted by behavior health NP after sitter heard patient scream and witnessed a generalized tonic-clonic seizure that lasted about 2 minutes.  He was given 2 mg of Ativan IV and seen by neurology.    Per psychiatry and his sister, patient lacks capacity for decision-making, requires a guardian. He is no longer under IVC and does not meet criteria for inpatient psych at this time.  Awaiting guardianship hearing which is reportedly scheduled for Sept 7th.    I assumed care of patient on 8/25.  He has remained medically stable, without signs of psychosis and compliant with his care over this week.  He is independent in his room.     Assessment & Plan   Principal Problem:   Seizure (HCC) Active Problems:   Psychosis (HCC)    Vitamin D deficiency -started on vitamin D3 1000 IU daily.  Recheck vitamin D level in about 3 months.  Breakthrough seizures, likely secondary to AEDs noncompliance- stable. AEDs adjusted by neurology Currently on Lamictal 175 mg twice daily, Klonopin 0.5 mg daily, Keppra 1500 mg twice daily. Continue seizure  precautions IV Ativan as needed for active seizures greater than 3 minutes as recommended by neurology Neurology consulted Would benefit from home health RN to assist with his medications.   TOC has been consulted to assist with home health services and guardianship.   Need for guardianship is barrier to discharge, hearing pending.  Seizure disorder Management per above Will recheck Lamictal and Keppra levels with AM labs. Cont current regimen of keppra and lamictal.    Leukocytosis with Eosinophilia - not present on admission, mild.  Resolved.   Unclear etiology.  No clinical suspicion or evidence of infection.   New on 8/25, was normal previously, WBC 11.4>>11.3>>12.2>>12.6 >>9.3. Differential includes allergic reaction, infections (especially parasitic), drug reaction, autoimmune conditions (sarcoidosis, rheumatoid arthritis, inflammatory bowel disease, IgG4 disease), inflammatory condition or some malignancies can also cause.   He is without respiratory symptoms, skin findings, organomegaly or lymphadenopathy on exam. IgG4 is within normal limits. CXR on 8/27 showed low lung volumes, with right basilar atelectasis and bandlike opacity in the left midlung, atelectasis versus scarring.  Mediastinal contours were normal without hilar lymphadenopathy. --Follow blood culture & ACE level  Agitation suspect related to alcohol withdrawal-resolved. Admitted from behavioral health No evidence of alcohol withdrawal at the time of this visit. Continue psych medications, multivitamins, thiamine and folic acid.  Seen by psych no need for inpatient psych   DVT prophylaxis: enoxaparin (LOVENOX) injection 40 mg Start: 05/08/20 0000   Diet:  Diet Orders (From admission, onward)    Start     Ordered   05/08/20 1110  Diet regular Room service appropriate? Yes; Fluid consistency: Thin  Diet effective now       Question Answer Comment  Room service appropriate? Yes   Fluid consistency: Thin       05/08/20 1109            Code Status: Full Code    Subjective 05/25/20    Patient seen at bedside this morning.  Reports feeling well.  Denies acute complaints including fever/chills, CP, SOB, cough, N/V/D or other complaints.   Disposition Plan & Communication   Status is: Inpatient  Remains inpatient appropriate because: awaiting guardianship hearing.  Dispo:  Patient From: Home  Planned Disposition: ALF vs group home  Expected discharge date: pending guardianship hearing on Sept 7th  Medically stable for discharge: No    Family Communication: None at bedside, will attempt to call   Consults, Procedures, Significant Events   Consultants:   None  Procedures:   None  Antimicrobials:   None    Objective   Vitals:   05/25/20 0425 05/25/20 0822 05/25/20 1139 05/25/20 1552  BP: 100/69 96/66 102/68 108/74  Pulse: 65 71 86 88  Resp: 16 19 18    Temp: 98.3 F (36.8 C) 98.7 F (37.1 C) 98.5 F (36.9 C) 98.7 F (37.1 C)  TempSrc:  Oral Oral Oral  SpO2: 99% 96% 96% 96%  Weight:      Height:        Intake/Output Summary (Last 24 hours) at 05/25/2020 1642 Last data filed at 05/25/2020 1345 Gross per 24 hour  Intake 1200 ml  Output 1425 ml  Net -225 ml   Filed Weights   05/18/20 0434 05/21/20 0517 05/23/20 0622  Weight: 65.3 kg 64.6 kg 64.5 kg    Physical Exam:  General exam: awake, alert, no acute distress, frail Respiratory system: CTAB, normal respiratory effort, no wheezes rales or rhonchi appreciated. Cardiovascular system: normal S1/S2, RRR, no pedal edema.   Psychiatry: normal mood, flat affect  Labs   Data Reviewed: I have personally reviewed following labs and imaging studies  CBC: Recent Labs  Lab 05/20/20 0548 05/21/20 0958 05/22/20 0626 05/23/20 0537 05/24/20 0448  WBC 11.3* 12.2* 12.6* 9.3 8.4  NEUTROABS 4.9 5.5 5.5 4.7 4.8  HGB 12.2* 12.5* 12.7* 12.4* 12.6*  HCT 36.4* 37.1* 37.2* 36.9* 37.9*  MCV 87.9 86.9 84.9 87.4 87.9   PLT 305 286 289 277 257   Basic Metabolic Panel: Recent Labs  Lab 05/19/20 0446 05/22/20 0626  NA 139 138  K 4.0 4.2  CL 108 107  CO2 22 21*  GLUCOSE 98 88  BUN 19 28*  CREATININE 1.13 1.19  CALCIUM 8.8* 8.9  MG 2.2  --    GFR: Estimated Creatinine Clearance: 67.8 mL/min (by C-G formula based on SCr of 1.19 mg/dL). Liver Function Tests: No results for input(s): AST, ALT, ALKPHOS, BILITOT, PROT, ALBUMIN in the last 168 hours. No results for input(s): LIPASE, AMYLASE in the last 168 hours. No results for input(s): AMMONIA in the last 168 hours. Coagulation Profile: No results for input(s): INR, PROTIME in the last 168 hours. Cardiac Enzymes: No results for input(s): CKTOTAL, CKMB, CKMBINDEX, TROPONINI in the last 168 hours. BNP (last 3 results) No results for input(s): PROBNP in the last 8760 hours. HbA1C: No results for input(s): HGBA1C in the last 72 hours. CBG: No results for input(s): GLUCAP in the last 168 hours. Lipid Profile: No results for input(s): CHOL, HDL, LDLCALC, TRIG, CHOLHDL, LDLDIRECT in the last 72  hours. Thyroid Function Tests: No results for input(s): TSH, T4TOTAL, FREET4, T3FREE, THYROIDAB in the last 72 hours. Anemia Panel: No results for input(s): VITAMINB12, FOLATE, FERRITIN, TIBC, IRON, RETICCTPCT in the last 72 hours. Sepsis Labs: No results for input(s): PROCALCITON, LATICACIDVEN in the last 168 hours.  Recent Results (from the past 240 hour(s))  Culture, blood (single) w Reflex to ID Panel     Status: None (Preliminary result)   Collection Time: 05/21/20  5:06 PM   Specimen: BLOOD  Result Value Ref Range Status   Specimen Description BLOOD RIGHT [facility]  Final   Special Requests   Final    BOTTLES DRAWN AEROBIC AND ANAEROBIC Blood Culture adequate volume   Culture   Final    NO GROWTH 4 DAYS Performed at [facility], [address], [place], [place] [address]    Report Status PENDING  Incomplete      Imaging Studies   No  results found.   Medications   Scheduled Meds:  cholecalciferol  1,000 Units Oral Daily   clonazepam  0.5 mg Oral BID   enoxaparin (LOVENOX) injection  40 mg Subcutaneous Q24H   haloperidol  2 mg Oral BID   lamoTRIgine  175 mg Oral BID   levETIRAcetam  1,500 mg Oral BID   melatonin  5 mg Oral QHS   OLANZapine  5 mg Oral QHS   vitamin B-6  50 mg Oral Daily   sodium chloride flush  10 mL Intravenous Q12H   thiamine  100 mg Oral Daily   vitamin B-12  1,000 mcg Oral Daily   Continuous Infusions:     LOS: 18 days    Time spent: 15 minutes    Pennie Banter, DO [facility]  05/25/2020, 4:42 PM    If 7PM-7AM, please contact night-coverage. How to contact the [facility] Attending or Consulting provider 7A - 7P or covering provider during after hours 7P -7A, for this patient?    1. Check the care team in [facility] and look for a) attending/consulting TR[facility] provider listed and b) the [facility] team listed 2. Log into [email or web address] and use [facility]'s universal password to access. If you do not have the password, please contact the hospital operator. 3. Locate the [facility] provider you are looking for under [facility] and page to a number that you can be directly reached. 4. If you still have difficulty reaching the provider, please page the [facility] (Director on Call) for the Hospitalists listed on amion for assistance.

## 2020-05-25 NOTE — TOC Progression Note (Signed)
Transition of Care [facility]) - Progression Note    Patient Details  Name: Lance Ray MRN: [id number] Date of Birth: 06/17/1971  [facility] CM/SW Contact  Shawn Route, RN Phone Number: 05/25/2020, 10:48 AM  Clinical Narrative:     Spoke with Sister [name] on the phone, she is still unable to complete disability paperwork or medicaid paperwork until after the hearing.  She spoke with guardian a litem who will not complete paperwork.    It appears patient has progressed to needing a group home from alf or snf.  She is going to start looking for placement for him and has asked for a list of group homes in the area.    I have reached out to my peers to try to find a list to send her to start her search.       Expected Discharge Plan and Services                                                 Social Determinants of Health (SDOH) Interventions    Readmission Risk Interventions Readmission Risk Prevention Plan 03/29/2020  Transportation Screening Complete  HRI or Home Care Consult Complete  Social Work Consult for Recovery Care Planning/Counseling Complete  Palliative Care Screening Not Applicable  Medication Review [employer]) Referral to Pharmacy  Some recent data might be hidden

## 2020-05-26 ENCOUNTER — Encounter: Payer: Self-pay | Admitting: Family Medicine

## 2020-05-26 LAB — LEVETIRACETAM LEVEL: Levetiracetam Lvl: 64.6 ug/mL — ABNORMAL HIGH (ref 10.0–40.0)

## 2020-05-26 LAB — BASIC METABOLIC PANEL
Anion gap: 8 (ref 5–15)
BUN: 25 mg/dL — ABNORMAL HIGH (ref 6–20)
CO2: 23 mmol/L (ref 22–32)
Calcium: 8.9 mg/dL (ref 8.9–10.3)
Chloride: 107 mmol/L (ref 98–111)
Creatinine, Ser: 1.13 mg/dL (ref 0.61–1.24)
GFR calc Af Amer: >60 mL/min (ref >60)
GFR calc non Af Amer: >60 mL/min (ref >60)
Glucose, Bld: 91 mg/dL (ref 70–99)
Potassium: 4 mmol/L (ref 3.5–5.1)
Sodium: 138 mmol/L (ref 135–145)

## 2020-05-26 LAB — CULTURE, BLOOD (SINGLE)
Culture: NO GROWTH
Special Requests: ADEQUATE

## 2020-05-26 NOTE — Progress Notes (Signed)
Progress Note    Lance Ray  AYT:[id number] DOB: 1971-03-06  DOA: 05/07/2020 PCP: Patient, No Pcp Per      Brief Narrative:    Medical records reviewed and are as summarized below:  Lance Ray is a 49 y.o. male       Assessment/Plan:   Principal Problem:   Seizure (HCC) Active Problems:   Psychosis (HCC)   Seizure disorder with breakthrough seizures Agitation suspected to be due to alcohol withdrawal syndrome-resolved Leukocytosis with eosinophilia-resolved   PLAN  Continue antiepileptics Continue psychotropics Legal guardianship hearing is pending.   Body mass index is 21.01 kg/m.  Diet Order            Diet regular Room service appropriate? Yes; Fluid consistency: Thin  Diet effective now                    Consultants:  Psychiatrist Neurologist  Procedures:      Medications:   . cholecalciferol  1,000 Units Oral Daily  . clonazepam  0.5 mg Oral BID  . enoxaparin (LOVENOX) injection  40 mg Subcutaneous Q24H  . haloperidol  2 mg Oral BID  . lamoTRIgine  175 mg Oral BID  . levETIRAcetam  1,500 mg Oral BID  . melatonin  5 mg Oral QHS  . OLANZapine  5 mg Oral QHS  . vitamin B-6  50 mg Oral Daily  . sodium chloride flush  10 mL Intravenous Q12H  . thiamine  100 mg Oral Daily  . vitamin B-12  1,000 mcg Oral Daily   Continuous Infusions:   Anti-infectives (From admission, onward)   None             Family Communication/Anticipated D/C date and plan/Code Status   DVT prophylaxis: enoxaparin (LOVENOX) injection 40 mg Start: 05/08/20 0000     Code Status: Full Code  Family Communication: Plan discussed with patient Disposition Plan:    Status is: Inpatient  Remains inpatient appropriate because:Unsafe d/c plan   Dispo:  Patient From:  Home  Planned Disposition: Assisted living facility  Expected discharge date: Pending completion of legal guardianship Medically stable for discharge: Not  medically stable for discharge           Subjective:   No complaints.  No shortness of breath, chest pain or dizziness.  Objective:    Vitals:   05/25/20 2121 05/26/20 0604 05/26/20 0736 05/26/20 1156  BP: 104/74 96/66 91/64  97/66  Pulse: 82 69 70 74  Resp: 20 20 18 19   Temp: 98.6 F (37 C) 98.6 F (37 C) 98.2 F (36.8 C) 98.8 F (37.1 C)  TempSrc:  Oral Oral Oral  SpO2: 98% 98% 98%   Weight:      Height:       No data found.   Intake/Output Summary (Last 24 hours) at 05/26/2020 1546 Last data filed at 05/26/2020 1006 Gross per 24 hour  Intake 960 ml  Output 600 ml  Net 360 ml   Filed Weights   05/18/20 0434 05/21/20 0517 05/23/20 0622  Weight: 65.3 kg 64.6 kg 64.5 kg    Exam:  GEN: NAD SKIN: No rash EYES: EOMI ENT: MMM CV: RRR PULM: CTA B ABD: soft, ND, NT, +BS CNS: AAO x 3, non focal EXT: No edema or tenderness   Data Reviewed:   I have personally reviewed following labs and imaging studies:  Labs: Labs show the following:   Basic Metabolic Panel: Recent Labs  Lab 05/22/20 0626 05/26/20 0508  NA 138 138  K 4.2 4.0  CL 107 107  CO2 21* 23  GLUCOSE 88 91  BUN 28* 25*  CREATININE 1.19 1.13  CALCIUM 8.9 8.9   GFR Estimated Creatinine Clearance: 71.3 mL/min (by C-G formula based on SCr of 1.13 mg/dL). Liver Function Tests: No results for input(s): AST, ALT, ALKPHOS, BILITOT, PROT, ALBUMIN in the last 168 hours. No results for input(s): LIPASE, AMYLASE in the last 168 hours. No results for input(s): AMMONIA in the last 168 hours. Coagulation profile No results for input(s): INR, PROTIME in the last 168 hours.  CBC: Recent Labs  Lab 05/20/20 0548 05/21/20 0958 05/22/20 0626 05/23/20 0537 05/24/20 0448  WBC 11.3* 12.2* 12.6* 9.3 8.4  NEUTROABS 4.9 5.5 5.5 4.7 4.8  HGB 12.2* 12.5* 12.7* 12.4* 12.6*  HCT 36.4* 37.1* 37.2* 36.9* 37.9*  MCV 87.9 86.9 84.9 87.4 87.9  PLT 305 286 289 277 257   Cardiac Enzymes: No results for  input(s): CKTOTAL, CKMB, CKMBINDEX, TROPONINI in the last 168 hours. BNP (last 3 results) No results for input(s): PROBNP in the last 8760 hours. CBG: No results for input(s): GLUCAP in the last 168 hours. D-Dimer: No results for input(s): DDIMER in the last 72 hours. Hgb A1c: No results for input(s): HGBA1C in the last 72 hours. Lipid Profile: No results for input(s): CHOL, HDL, LDLCALC, TRIG, CHOLHDL, LDLDIRECT in the last 72 hours. Thyroid function studies: No results for input(s): TSH, T4TOTAL, T3FREE, THYROIDAB in the last 72 hours.  Invalid input(s): FREET3 Anemia work up: No results for input(s): VITAMINB12, FOLATE, FERRITIN, TIBC, IRON, RETICCTPCT in the last 72 hours. Sepsis Labs: Recent Labs  Lab 05/21/20 0958 05/22/20 0626 05/23/20 0537 05/24/20 0448  WBC 12.2* 12.6* 9.3 8.4    Microbiology Recent Results (from the past 240 hour(s))  Culture, blood (single) w Reflex to ID Panel     Status: None   Collection Time: 05/21/20  5:06 PM   Specimen: BLOOD  Result Value Ref Range Status   Specimen Description BLOOD RIGHT [facility]  Final   Special Requests   Final    BOTTLES DRAWN AEROBIC AND ANAEROBIC Blood Culture adequate volume   Culture   Final    NO GROWTH 5 DAYS Performed at [facility], [address] [place], [place] [address]    Report Status 05/26/2020 FINAL  Final    Procedures and diagnostic studies:  No results found.             LOS: 19 days   [name]  [facility] [employer] loss adjuster on [email or web address]. If 7PM-7AM, please contact night-coverage at [email or web address]     05/26/2020, 3:46 PM

## 2020-05-27 NOTE — Progress Notes (Signed)
Progress Note    Lance Ray  OIB:[id number] DOB: [birth year]  DOA: 05/07/2020 PCP: Patient, No Pcp Per      Brief Narrative:    Medical records reviewed and are as summarized below:  Lance Ray is a 49 y.o. male       Assessment/Plan:   Principal Problem:   Seizure (HCC) Active Problems:   Psychosis (HCC)   Seizure disorder with breakthrough seizures Agitation suspected to be due to alcohol withdrawal syndrome-resolved Leukocytosis with eosinophilia-resolved   PLAN  Continue antiepileptics Continue psychotropics Legal guardianship hearing has been postponed to October.  The patient told me that he lost his job in January 2021.  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  He said he lost his insurance when he lost his job and so he was not taking his medicines because he could not afford the medicines.  However, he also said that even when he had a job, he was not taking his medicines.  He said he was just not motivated to take the medicines despite knowing the risks of medical nonadherence.  I asked him whether he would take his medicines on a regular basis if he were to be offered free prescriptions but he said he might not take the medicines even if he got from prescriptions.  He said he is just not motivated to take any medicines.  However, he was happy that he was getting his medicines in the hospital.  Judeth Cornfield, RN and her perceptee nurse were at the bedside during this encounter.    Body mass index is 21.01 kg/m.  Diet Order            Diet regular Room service appropriate? Yes; Fluid consistency: Thin  Diet effective now                    Consultants:  Psychiatrist Neurologist  Procedures:      Medications:   . cholecalciferol  1,000 Units Oral Daily  . clonazepam  0.5 mg Oral BID  . enoxaparin (LOVENOX) injection  40 mg Subcutaneous Q24H  . haloperidol  2 mg Oral BID  . lamoTRIgine  175 mg Oral BID  . levETIRAcetam  1,500  mg Oral BID  . melatonin  5 mg Oral QHS  . OLANZapine  5 mg Oral QHS  . vitamin B-6  50 mg Oral Daily  . sodium chloride flush  10 mL Intravenous Q12H  . thiamine  100 mg Oral Daily  . vitamin B-12  1,000 mcg Oral Daily   Continuous Infusions:   Anti-infectives (From admission, onward)   None             Family Communication/Anticipated D/C date and plan/Code Status   DVT prophylaxis: enoxaparin (LOVENOX) injection 40 mg Start: 05/08/20 0000     Code Status: Full Code  Family Communication: Plan discussed with patient Disposition Plan:    Status is: Inpatient  Remains inpatient appropriate because:Unsafe d/c plan   Dispo:  Patient From:  Home  Planned Disposition: Assisted living facility  Expected discharge date: Pending completion of legal guardianship Medically stable for discharge: Not medically stable for discharge           Subjective:   Interval events noted.  He has no complaints.  Objective:    Vitals:   05/27/20 0417 05/27/20 0905 05/27/20 1053 05/27/20 1612  BP: 96/70 92/63 98/66  91/62  Pulse: 72 69 77 72  Resp: 17 18 18  18  Temp: 98.3 F (36.8 C) 98.2 F (36.8 C) 97.8 F (36.6 C) 97.8 F (36.6 C)  TempSrc:  Oral    SpO2: 95% 99% 97% 96%  Weight:      Height:       No data found.   Intake/Output Summary (Last 24 hours) at 05/27/2020 1620 Last data filed at 05/27/2020 1611 Gross per 24 hour  Intake 480 ml  Output 2100 ml  Net -1620 ml   Filed Weights   05/18/20 0434 05/21/20 0517 05/23/20 0622  Weight: 65.3 kg 64.6 kg 64.5 kg    Exam:  GEN: NAD SKIN: Warm and dry EYES: No pallor or icterus ENT: MMM CV: RRR PULM: No wheezing or rales. ABD: Soft and nontender CNS: Alert and oriented x3, no focal deficits EXT: No swelling or tenderness PSYCH: Calm and coopereative   Data Reviewed:   I have personally reviewed following labs and imaging studies:  Labs: Labs show the following:   Basic Metabolic  Panel: Recent Labs  Lab 05/22/20 0626 05/26/20 0508  NA 138 138  K 4.2 4.0  CL 107 107  CO2 21* 23  GLUCOSE 88 91  BUN 28* 25*  CREATININE 1.19 1.13  CALCIUM 8.9 8.9   GFR Estimated Creatinine Clearance: 71.3 mL/min (by C-G formula based on SCr of 1.13 mg/dL). Liver Function Tests: No results for input(s): AST, ALT, ALKPHOS, BILITOT, PROT, ALBUMIN in the last 168 hours. No results for input(s): LIPASE, AMYLASE in the last 168 hours. No results for input(s): AMMONIA in the last 168 hours. Coagulation profile No results for input(s): INR, PROTIME in the last 168 hours.  CBC: Recent Labs  Lab 05/21/20 0958 05/22/20 0626 05/23/20 0537 05/24/20 0448  WBC 12.2* 12.6* 9.3 8.4  NEUTROABS 5.5 5.5 4.7 4.8  HGB 12.5* 12.7* 12.4* 12.6*  HCT 37.1* 37.2* 36.9* 37.9*  MCV 86.9 84.9 87.4 87.9  PLT 286 289 277 257   Cardiac Enzymes: No results for input(s): CKTOTAL, CKMB, CKMBINDEX, TROPONINI in the last 168 hours. BNP (last 3 results) No results for input(s): PROBNP in the last 8760 hours. CBG: No results for input(s): GLUCAP in the last 168 hours. D-Dimer: No results for input(s): DDIMER in the last 72 hours. Hgb A1c: No results for input(s): HGBA1C in the last 72 hours. Lipid Profile: No results for input(s): CHOL, HDL, LDLCALC, TRIG, CHOLHDL, LDLDIRECT in the last 72 hours. Thyroid function studies: No results for input(s): TSH, T4TOTAL, T3FREE, THYROIDAB in the last 72 hours.  Invalid input(s): FREET3 Anemia work up: No results for input(s): VITAMINB12, FOLATE, FERRITIN, TIBC, IRON, RETICCTPCT in the last 72 hours. Sepsis Labs: Recent Labs  Lab 05/21/20 0958 05/22/20 0626 05/23/20 0537 05/24/20 0448  WBC 12.2* 12.6* 9.3 8.4    Microbiology Recent Results (from the past 240 hour(s))  Culture, blood (single) w Reflex to ID Panel     Status: None   Collection Time: 05/21/20  5:06 PM   Specimen: BLOOD  Result Value Ref Range Status   Specimen Description BLOOD  RIGHT [facility]  Final   Special Requests   Final    BOTTLES DRAWN AEROBIC AND ANAEROBIC Blood Culture adequate volume   Culture   Final    NO GROWTH 5 DAYS Performed at [facility], [address] [place], [place] [address]    Report Status 05/26/2020 FINAL  Final    Procedures and diagnostic studies:  No results found.  LOS: 20 days   Alok Minshall  [facility]   Pager on [email or web address]. If 7PM-7AM, please contact night-coverage at [email or web address]     05/27/2020, 4:20 PM

## 2020-05-28 NOTE — Progress Notes (Signed)
Progress Note    [name]  ZVG:[id number] DOB: [birth year]  DOA: 05/07/2020 PCP: Patient, No Pcp Per      Brief Narrative:    Medical records reviewed and are as summarized below:  Lance Ray is a 49 y.o. male       Assessment/Plan:   Principal Problem:   Seizure (HCC) Active Problems:   Psychosis (HCC)   Seizure disorder with breakthrough seizures Agitation suspected to be due to alcohol withdrawal syndrome-resolved Leukocytosis with eosinophilia-resolved   PLAN  Continue psychotropics Continue antiepileptics Legal guardianship hearing has been postponed to October.   Body mass index is 21.12 kg/m.  Diet Order            Diet regular Room service appropriate? Yes; Fluid consistency: Thin  Diet effective now                    Consultants:  Psychiatrist Neurologist  Procedures:      Medications:    cholecalciferol  1,000 Units Oral Daily   clonazepam  0.5 mg Oral BID   enoxaparin (LOVENOX) injection  40 mg Subcutaneous Q24H   haloperidol  2 mg Oral BID   lamoTRIgine  175 mg Oral BID   levETIRAcetam  1,500 mg Oral BID   melatonin  5 mg Oral QHS   OLANZapine  5 mg Oral QHS   vitamin B-6  50 mg Oral Daily   sodium chloride flush  10 mL Intravenous Q12H   thiamine  100 mg Oral Daily   vitamin B-12  1,000 mcg Oral Daily   Continuous Infusions:   Anti-infectives (From admission, onward)   None             Family Communication/Anticipated D/C date and plan/Code Status   DVT prophylaxis: enoxaparin (LOVENOX) injection 40 mg Start: 05/08/20 0000     Code Status: Full Code  Family Communication: Plan discussed with patient Disposition Plan:    Status is: Inpatient  Remains inpatient appropriate because:Unsafe d/c plan   Dispo:  Patient From:  Home  Planned Disposition: Assisted living facility  Expected discharge date: Pending completion of legal guardianship Medically stable for  discharge: Medically stable           Subjective:   No acute issues.  He has no complaints.  Objective:    Vitals:   05/27/20 2012 05/28/20 0334 05/28/20 0730 05/28/20 1141  BP: 103/71 91/67 93/72  99/66  Pulse: 89 63 70 71  Resp: 20 20 18 18   Temp: 98.5 F (36.9 C) 97.9 F (36.6 C) 98.4 F (36.9 C) 98.4 F (36.9 C)  TempSrc: Oral Oral Oral Oral  SpO2: 97% 100% 98% 99%  Weight:  64.9 kg    Height:       No data found.   Intake/Output Summary (Last 24 hours) at 05/28/2020 1345 Last data filed at 05/28/2020 0950 Gross per 24 hour  Intake 1320 ml  Output 1800 ml  Net -480 ml   Filed Weights   05/21/20 0517 05/23/20 0622 05/28/20 0334  Weight: 64.6 kg 64.5 kg 64.9 kg    Exam:  GEN: No acute distress SKIN: Warm and dry EYES: Anicteric, no pallor ENT: MMM CV: RRR PULM: Clear to auscultation bilaterally ABD: Soft, nontender CNS: Alert and oriented.  Nonfocal EXT: No swelling or tenderness PSYCH: Calm and coopereative   Data Reviewed:   I have personally reviewed following labs and imaging studies:  Labs: Labs show the following:  Basic Metabolic Panel: Recent Labs  Lab 05/22/20 0626 05/26/20 0508  NA 138 138  K 4.2 4.0  CL 107 107  CO2 21* 23  GLUCOSE 88 91  BUN 28* 25*  CREATININE 1.19 1.13  CALCIUM 8.9 8.9   GFR Estimated Creatinine Clearance: 71.8 mL/min (by C-G formula based on SCr of 1.13 mg/dL). Liver Function Tests: No results for input(s): AST, ALT, ALKPHOS, BILITOT, PROT, ALBUMIN in the last 168 hours. No results for input(s): LIPASE, AMYLASE in the last 168 hours. No results for input(s): AMMONIA in the last 168 hours. Coagulation profile No results for input(s): INR, PROTIME in the last 168 hours.  CBC: Recent Labs  Lab 05/22/20 0626 05/23/20 0537 05/24/20 0448  WBC 12.6* 9.3 8.4  NEUTROABS 5.5 4.7 4.8  HGB 12.7* 12.4* 12.6*  HCT 37.2* 36.9* 37.9*  MCV 84.9 87.4 87.9  PLT 289 277 257   Cardiac Enzymes: No  results for input(s): CKTOTAL, CKMB, CKMBINDEX, TROPONINI in the last 168 hours. BNP (last 3 results) No results for input(s): PROBNP in the last 8760 hours. CBG: No results for input(s): GLUCAP in the last 168 hours. D-Dimer: No results for input(s): DDIMER in the last 72 hours. Hgb A1c: No results for input(s): HGBA1C in the last 72 hours. Lipid Profile: No results for input(s): CHOL, HDL, LDLCALC, TRIG, CHOLHDL, LDLDIRECT in the last 72 hours. Thyroid function studies: No results for input(s): TSH, T4TOTAL, T3FREE, THYROIDAB in the last 72 hours.  Invalid input(s): FREET3 Anemia work up: No results for input(s): VITAMINB12, FOLATE, FERRITIN, TIBC, IRON, RETICCTPCT in the last 72 hours. Sepsis Labs: Recent Labs  Lab 05/22/20 0626 05/23/20 0537 05/24/20 0448  WBC 12.6* 9.3 8.4    Microbiology Recent Results (from the past 240 hour(s))  Culture, blood (single) w Reflex to ID Panel     Status: None   Collection Time: 05/21/20  5:06 PM   Specimen: BLOOD  Result Value Ref Range Status   Specimen Description BLOOD RIGHT [facility]  Final   Special Requests   Final    BOTTLES DRAWN AEROBIC AND ANAEROBIC Blood Culture adequate volume   Culture   Final    NO GROWTH 5 DAYS Performed at [facility], [address] [place], [place] [address]    Report Status 05/26/2020 FINAL  Final    Procedures and diagnostic studies:  No results found.             LOS: 21 days   Lennox Leikam  [employer]ered loss adjuster on [email or web address]. If 7PM-7AM, please contact night-coverage at [email or web address]     05/28/2020, 1:45 PM

## 2020-05-29 NOTE — Progress Notes (Signed)
Progress Note    Lance Ray  QMG:[id number] DOB: 1971-03-05  DOA: 05/07/2020 PCP: Patient, No Pcp Per      Brief Narrative:    Medical records reviewed and are as summarized below:  Lance Ray is a 49 y.o. male       Assessment/Plan:   Principal Problem:   Seizure (HCC) Active Problems:   Psychosis (HCC)   Seizure disorder with breakthrough seizures Agitation suspected to be due to alcohol withdrawal syndrome-resolved Leukocytosis with eosinophilia-resolved   PLAN  Continue antiepileptics of psychotropics Legal guardianship hearing has been postponed to October Awaiting placement.   Body mass index is 21.75 kg/m.  Diet Order            Diet regular Room service appropriate? Yes; Fluid consistency: Thin  Diet effective now                    Consultants:  Psychiatrist Neurologist  Procedures:      Medications:   . cholecalciferol  1,000 Units Oral Daily  . clonazepam  0.5 mg Oral BID  . enoxaparin (LOVENOX) injection  40 mg Subcutaneous Q24H  . haloperidol  2 mg Oral BID  . lamoTRIgine  175 mg Oral BID  . levETIRAcetam  1,500 mg Oral BID  . melatonin  5 mg Oral QHS  . OLANZapine  5 mg Oral QHS  . vitamin B-6  50 mg Oral Daily  . sodium chloride flush  10 mL Intravenous Q12H  . thiamine  100 mg Oral Daily  . vitamin B-12  1,000 mcg Oral Daily   Continuous Infusions:   Anti-infectives (From admission, onward)   None             Family Communication/Anticipated D/C date and plan/Code Status   DVT prophylaxis: enoxaparin (LOVENOX) injection 40 mg Start: 05/08/20 0000     Code Status: Full Code  Family Communication: Plan discussed with patient Disposition Plan:    Status is: Inpatient  Remains inpatient appropriate because:Unsafe d/c plan   Dispo:  Patient From:  Home  Planned Disposition: Assisted living facility  Expected discharge date: Pending completion of legal guardianship Medically  stable for discharge: Medically stable           Subjective:   Interval events noted.  No acute issues.  He has no complaints.  Objective:    Vitals:   05/29/20 0500 05/29/20 1025 05/29/20 1314 05/29/20 1641  BP: (!) 95/59 97/65 101/70 94/60  Pulse: 70 74 68 72  Resp: 17 18 18 18   Temp: 98.2 F (36.8 C) (!) 97.5 F (36.4 C) 98.1 F (36.7 C) 98.4 F (36.9 C)  TempSrc: Oral Oral Oral Oral  SpO2: 97% 99% 99% 97%  Weight: 66.8 kg     Height:       No data found.   Intake/Output Summary (Last 24 hours) at 05/29/2020 1721 Last data filed at 05/28/2020 1840 Gross per 24 hour  Intake 720 ml  Output --  Net 720 ml   Filed Weights   05/23/20 0622 05/28/20 0334 05/29/20 0500  Weight: 64.5 kg 64.9 kg 66.8 kg    Exam:  GEN: NAD SKIN: No rash EYES: EOMI ENT: MMM CV: RRR PULM: CTA B ABD: soft, ND, NT, +BS CNS: AAO x 3, non focal EXT: No edema or tenderness   Data Reviewed:   I have personally reviewed following labs and imaging studies:  Labs: Labs show the following:   Basic  Metabolic Panel: Recent Labs  Lab 05/26/20 0508  NA 138  K 4.0  CL 107  CO2 23  GLUCOSE 91  BUN 25*  CREATININE 1.13  CALCIUM 8.9   GFR Estimated Creatinine Clearance: 73.9 mL/min (by C-G formula based on SCr of 1.13 mg/dL). Liver Function Tests: No results for input(s): AST, ALT, ALKPHOS, BILITOT, PROT, ALBUMIN in the last 168 hours. No results for input(s): LIPASE, AMYLASE in the last 168 hours. No results for input(s): AMMONIA in the last 168 hours. Coagulation profile No results for input(s): INR, PROTIME in the last 168 hours.  CBC: Recent Labs  Lab 05/23/20 0537 05/24/20 0448  WBC 9.3 8.4  NEUTROABS 4.7 4.8  HGB 12.4* 12.6*  HCT 36.9* 37.9*  MCV 87.4 87.9  PLT 277 257   Cardiac Enzymes: No results for input(s): CKTOTAL, CKMB, CKMBINDEX, TROPONINI in the last 168 hours. BNP (last 3 results) No results for input(s): PROBNP in the last 8760 hours. CBG: No  results for input(s): GLUCAP in the last 168 hours. D-Dimer: No results for input(s): DDIMER in the last 72 hours. Hgb A1c: No results for input(s): HGBA1C in the last 72 hours. Lipid Profile: No results for input(s): CHOL, HDL, LDLCALC, TRIG, CHOLHDL, LDLDIRECT in the last 72 hours. Thyroid function studies: No results for input(s): TSH, T4TOTAL, T3FREE, THYROIDAB in the last 72 hours.  Invalid input(s): FREET3 Anemia work up: No results for input(s): VITAMINB12, FOLATE, FERRITIN, TIBC, IRON, RETICCTPCT in the last 72 hours. Sepsis Labs: Recent Labs  Lab 05/23/20 0537 05/24/20 0448  WBC 9.3 8.4    Microbiology Recent Results (from the past 240 hour(s))  Culture, blood (single) w Reflex to ID Panel     Status: None   Collection Time: 05/21/20  5:06 PM   Specimen: BLOOD  Result Value Ref Range Status   Specimen Description BLOOD RIGHT [facility]  Final   Special Requests   Final    BOTTLES DRAWN AEROBIC AND ANAEROBIC Blood Culture adequate volume   Culture   Final    NO GROWTH 5 DAYS Performed at [facility], [address], [place], [place] [address]    Report Status 05/26/2020 FINAL  Final    Procedures and diagnostic studies:  No results found.             LOS: 22 days   Lance Ray  [employer] Chartered loss adjuster on [email or web address]. If 7PM-7AM, please contact night-coverage at [email or web address]     05/29/2020, 5:21 PM

## 2020-05-30 NOTE — Progress Notes (Addendum)
Progress Note    BONNY EGGER  EYC:[id number] DOB: 16-Feb-1971  DOA: 05/07/2020 PCP: Patient, No Pcp Per      Brief Narrative:    Medical records reviewed and are as summarized below:  [name] is a 49 y.o. male       Assessment/Plan:   Principal Problem:   Seizure (HCC) Active Problems:   Psychosis (HCC)   Seizure disorder with breakthrough seizures Agitation suspected to be due to alcohol withdrawal syndrome-resolved Leukocytosis with eosinophilia-resolved   PLAN  Continue current meds. Recommended regular ambulation to prevent deconditioning Hearing for legal guardianship has been postponed till October.    Body mass index is 20.93 kg/m.  Diet Order            Diet regular Room service appropriate? Yes; Fluid consistency: Thin  Diet effective now                    Consultants:  Psychiatrist Neurologist  Procedures:      Medications:   . cholecalciferol  1,000 Units Oral Daily  . clonazepam  0.5 mg Oral BID  . enoxaparin (LOVENOX) injection  40 mg Subcutaneous Q24H  . haloperidol  2 mg Oral BID  . lamoTRIgine  175 mg Oral BID  . levETIRAcetam  1,500 mg Oral BID  . melatonin  5 mg Oral QHS  . OLANZapine  5 mg Oral QHS  . vitamin B-6  50 mg Oral Daily  . sodium chloride flush  10 mL Intravenous Q12H  . thiamine  100 mg Oral Daily  . vitamin B-12  1,000 mcg Oral Daily   Continuous Infusions:   Anti-infectives (From admission, onward)   None             Family Communication/Anticipated D/C date and plan/Code Status   DVT prophylaxis: enoxaparin (LOVENOX) injection 40 mg Start: 05/08/20 0000     Code Status: Full Code  Family Communication: Plan discussed with patient Disposition Plan:    Status is: Inpatient  Remains inpatient appropriate because:Unsafe d/c plan   Dispo:  Patient From:  Home  Planned Disposition: Assisted living facility  Expected discharge date: Pending completion of  legal guardianship Medically stable for discharge: Medically stable           Subjective:   No acute issues.  He has no complaints.  Objective:    Vitals:   05/29/20 1314 05/29/20 1641 05/29/20 2007 05/30/20 0450  BP: 101/70 94/60 96/65  99/70  Pulse: 68 72 81 65  Resp: 18 18 17 16   Temp: 98.1 F (36.7 C) 98.4 F (36.9 C) 98.5 F (36.9 C) 97.7 F (36.5 C)  TempSrc: Oral Oral  Oral  SpO2: 99% 97% 97% 96%  Weight:    64.3 kg  Height:       No data found.   Intake/Output Summary (Last 24 hours) at 05/30/2020 1631 Last data filed at 05/30/2020 1330 Gross per 24 hour  Intake 600 ml  Output 550 ml  Net 50 ml   Filed Weights   05/28/20 0334 05/29/20 0500 05/30/20 0450  Weight: 64.9 kg 66.8 kg 64.3 kg    Exam:  GEN: NAD SKIN: No rash EYES: EOMI ENT: MMM CV: RRR PULM: CTA B ABD: soft, ND, NT, +BS CNS: AAO x 3, non focal EXT: No edema or tenderness      Data Reviewed:   I have personally reviewed following labs and imaging studies:  Labs: Labs show the following:  Basic Metabolic Panel: Recent Labs  Lab 05/26/20 0508  NA 138  K 4.0  CL 107  CO2 23  GLUCOSE 91  BUN 25*  CREATININE 1.13  CALCIUM 8.9   GFR Estimated Creatinine Clearance: 71.1 mL/min (by C-G formula based on SCr of 1.13 mg/dL). Liver Function Tests: No results for input(s): AST, ALT, ALKPHOS, BILITOT, PROT, ALBUMIN in the last 168 hours. No results for input(s): LIPASE, AMYLASE in the last 168 hours. No results for input(s): AMMONIA in the last 168 hours. Coagulation profile No results for input(s): INR, PROTIME in the last 168 hours.  CBC: Recent Labs  Lab 05/24/20 0448  WBC 8.4  NEUTROABS 4.8  HGB 12.6*  HCT 37.9*  MCV 87.9  PLT 257   Cardiac Enzymes: No results for input(s): CKTOTAL, CKMB, CKMBINDEX, TROPONINI in the last 168 hours. BNP (last 3 results) No results for input(s): PROBNP in the last 8760 hours. CBG: No results for input(s): GLUCAP in the last  168 hours. D-Dimer: No results for input(s): DDIMER in the last 72 hours. Hgb A1c: No results for input(s): HGBA1C in the last 72 hours. Lipid Profile: No results for input(s): CHOL, HDL, LDLCALC, TRIG, CHOLHDL, LDLDIRECT in the last 72 hours. Thyroid function studies: No results for input(s): TSH, T4TOTAL, T3FREE, THYROIDAB in the last 72 hours.  Invalid input(s): FREET3 Anemia work up: No results for input(s): VITAMINB12, FOLATE, FERRITIN, TIBC, IRON, RETICCTPCT in the last 72 hours. Sepsis Labs: Recent Labs  Lab 05/24/20 0448  WBC 8.4    Microbiology Recent Results (from the past 240 hour(s))  Culture, blood (single) w Reflex to ID Panel     Status: None   Collection Time: 05/21/20  5:06 PM   Specimen: BLOOD  Result Value Ref Range Status   Specimen Description BLOOD RIGHT [facility]  Final   Special Requests   Final    BOTTLES DRAWN AEROBIC AND ANAEROBIC Blood Culture adequate volume   Culture   Final    NO GROWTH 5 DAYS Performed at [facility], [address] [place], [place] [address]    Report Status 05/26/2020 FINAL  Final    Procedures and diagnostic studies:  No results found.             LOS: 23 days   Talor Desrosiers  [facility] Chartered loss adjuster on [email or web address]. If 7PM-7AM, please contact night-coverage at [email or web address]     05/30/2020, 4:31 PM

## 2020-06-01 NOTE — Progress Notes (Signed)
Progress Note    Lance Ray  OVZ:[id number] DOB: July 30, 1971  DOA: 05/07/2020 PCP: Patient, No Pcp Per      Brief Narrative:    Medical records reviewed and are as summarized below:  Lance Ray is a 49 y.o. male Lance Ray is a 49 y.o. male with medical history significant for dementia, agitation and seizure.  Hospitalist team was consulted by psychiatrist for recurrent seizures.    Patient initally admitted on 6/30 under hospitalist service for generalized tonic clonic seizure due to non-compliance with Depakote. He was loaded with IV lorazepam and keppra and admitted to PCU. Had negative MRI brain and CT head. Hospital course was complicated by psychosis and agitation and required precedex gtt for agitation. Psychiatry was consulted and he was ultimately discharged to behavior health on 7/16 and was placed under involuntary commitment period.  While in the behavioral health unit, his sitter apparently heard the patient scream and witnessed patient have a generalized tonic-clonic seizure that lasted about 2 minutes. He was seen in consultation by the neurologist.   Patient does not have capacity to make decisions for himself.  He has been evaluated by the psychiatrist who said patient has no capacity to make decision for himself.  Patient is awaiting legal guardianship prior to discharge.    Assessment/Plan:   Principal Problem:   Seizure (HCC) Active Problems:   Psychosis (HCC)   Seizure disorder with breakthrough seizures Agitation suspected to be due to alcohol withdrawal syndrome-resolved Leukocytosis with eosinophilia-resolved   PLAN  Continue antipsychotics and antiepileptics Awaiting legal guardianship.    Body mass index is 21.31 kg/m.  Diet Order            Diet regular Room service appropriate? Yes; Fluid consistency: Thin  Diet effective now                     Consultants:  Psychiatrist Neurologist  Procedures:      Medications:   . cholecalciferol  1,000 Units Oral Daily  . clonazepam  0.5 mg Oral BID  . enoxaparin (LOVENOX) injection  40 mg Subcutaneous Q24H  . haloperidol  2 mg Oral BID  . lamoTRIgine  175 mg Oral BID  . levETIRAcetam  1,500 mg Oral BID  . melatonin  5 mg Oral QHS  . OLANZapine  5 mg Oral QHS  . vitamin B-6  50 mg Oral Daily  . sodium chloride flush  10 mL Intravenous Q12H  . thiamine  100 mg Oral Daily  . vitamin B-12  1,000 mcg Oral Daily   Continuous Infusions:   Anti-infectives (From admission, onward)   None             Family Communication/Anticipated D/C date and plan/Code Status   DVT prophylaxis: enoxaparin (LOVENOX) injection 40 mg Start: 05/08/20 0000     Code Status: Full Code  Family Communication: Plan discussed with patient Disposition Plan:    Status is: Inpatient  Remains inpatient appropriate because:Unsafe d/c plan   Dispo:  Patient From:  Home  Planned Disposition: Assisted living facility  Expected discharge date: Pending completion of legal guardianship Medically stable for discharge: Medically stable           Subjective:   Interval events noted.  He said he ambulated in the hallway without any problems.  Objective:    Vitals:   05/31/20 2324 06/01/20 0418 06/01/20 0722 06/01/20 1126  BP: 102/73 97/67 (!) 94/57 93/65  Pulse:  78 71 69 77  Resp: 16 14 16 18   Temp: 98.9 F (37.2 C) 98.5 F (36.9 C) 98.5 F (36.9 C) 98.4 F (36.9 C)  TempSrc: Oral Oral Oral Oral  SpO2: 96% 96% 97% 99%  Weight:      Height:       No data found.   Intake/Output Summary (Last 24 hours) at 06/01/2020 1523 Last data filed at 06/01/2020 1408 Gross per 24 hour  Intake 480 ml  Output --  Net 480 ml   Filed Weights   05/29/20 0500 05/30/20 0450 05/31/20 0452  Weight: 66.8 kg 64.3 kg 65.5 kg    Exam:   GEN: NAD SKIN: No rash EYES: No pallor  or icterus ENT: MMM CV: RRR PULM: CTA B ABD: soft, ND, NT, +BS CNS: AAO x 3, non focal EXT: No edema or tenderness       Data Reviewed:   I have personally reviewed following labs and imaging studies:  Labs: Labs show the following:   Basic Metabolic Panel: Recent Labs  Lab 05/26/20 0508  NA 138  K 4.0  CL 107  CO2 23  GLUCOSE 91  BUN 25*  CREATININE 1.13  CALCIUM 8.9   GFR Estimated Creatinine Clearance: 72.5 mL/min (by C-G formula based on SCr of 1.13 mg/dL). Liver Function Tests: No results for input(s): AST, ALT, ALKPHOS, BILITOT, PROT, ALBUMIN in the last 168 hours. No results for input(s): LIPASE, AMYLASE in the last 168 hours. No results for input(s): AMMONIA in the last 168 hours. Coagulation profile No results for input(s): INR, PROTIME in the last 168 hours.  CBC: No results for input(s): WBC, NEUTROABS, HGB, HCT, MCV, PLT in the last 168 hours. Cardiac Enzymes: No results for input(s): CKTOTAL, CKMB, CKMBINDEX, TROPONINI in the last 168 hours. BNP (last 3 results) No results for input(s): PROBNP in the last 8760 hours. CBG: No results for input(s): GLUCAP in the last 168 hours. D-Dimer: No results for input(s): DDIMER in the last 72 hours. Hgb A1c: No results for input(s): HGBA1C in the last 72 hours. Lipid Profile: No results for input(s): CHOL, HDL, LDLCALC, TRIG, CHOLHDL, LDLDIRECT in the last 72 hours. Thyroid function studies: No results for input(s): TSH, T4TOTAL, T3FREE, THYROIDAB in the last 72 hours.  Invalid input(s): FREET3 Anemia work up: No results for input(s): VITAMINB12, FOLATE, FERRITIN, TIBC, IRON, RETICCTPCT in the last 72 hours. Sepsis Labs: No results for input(s): PROCALCITON, WBC, LATICACIDVEN in the last 168 hours.  Microbiology No results found for this or any previous visit (from the past 240 hour(s)).  Procedures and diagnostic studies:  No results found.             LOS: 25 days   Tiaira Arambula  [facility] 07/26/20 on [email or web address]. If 7PM-7AM, please contact night-coverage at [email or web address]     06/01/2020, 3:23 PM

## 2020-06-02 DIAGNOSIS — E559 Vitamin D deficiency, unspecified: Secondary | ICD-10-CM

## 2020-06-02 DIAGNOSIS — D649 Anemia, unspecified: Secondary | ICD-10-CM

## 2020-06-02 LAB — CBC WITH DIFFERENTIAL/PLATELET
Abs Immature Granulocytes: 0.02 10*3/uL (ref 0.00–0.07)
Basophils Absolute: 0 10*3/uL (ref 0.0–0.1)
Basophils Relative: 1 %
Eosinophils Absolute: 0.6 10*3/uL — ABNORMAL HIGH (ref 0.0–0.5)
Eosinophils Relative: 11 %
HCT: 36.8 % — ABNORMAL LOW (ref 39.0–52.0)
Hemoglobin: 12.7 g/dL — ABNORMAL LOW (ref 13.0–17.0)
Immature Granulocytes: 0 %
Lymphocytes Relative: 20 %
Lymphs Abs: 1.2 10*3/uL (ref 0.7–4.0)
MCH: 29.1 pg (ref 26.0–34.0)
MCHC: 34.5 g/dL (ref 30.0–36.0)
MCV: 84.2 fL (ref 80.0–100.0)
Monocytes Absolute: 0.5 10*3/uL (ref 0.1–1.0)
Monocytes Relative: 9 %
Neutro Abs: 3.4 10*3/uL (ref 1.7–7.7)
Neutrophils Relative %: 59 %
Platelets: 223 10*3/uL (ref 150–400)
RBC: 4.37 MIL/uL (ref 4.22–5.81)
RDW: 13.4 % (ref 11.5–15.5)
WBC: 5.8 10*3/uL (ref 4.0–10.5)
nRBC: 0 % (ref 0.0–0.2)

## 2020-06-02 LAB — COMPREHENSIVE METABOLIC PANEL
ALT: 35 U/L (ref 0–44)
AST: 19 U/L (ref 15–41)
Albumin: 3.8 g/dL (ref 3.5–5.0)
Alkaline Phosphatase: 67 U/L (ref 38–126)
Anion gap: 7 (ref 5–15)
BUN: 24 mg/dL — ABNORMAL HIGH (ref 6–20)
CO2: 23 mmol/L (ref 22–32)
Calcium: 8.9 mg/dL (ref 8.9–10.3)
Chloride: 108 mmol/L (ref 98–111)
Creatinine, Ser: 0.98 mg/dL (ref 0.61–1.24)
GFR calc Af Amer: >60 mL/min (ref >60)
GFR calc non Af Amer: >60 mL/min (ref >60)
Glucose, Bld: 96 mg/dL (ref 70–99)
Potassium: 4.2 mmol/L (ref 3.5–5.1)
Sodium: 138 mmol/L (ref 135–145)
Total Bilirubin: 0.6 mg/dL (ref 0.3–1.2)
Total Protein: 6.1 g/dL — ABNORMAL LOW (ref 6.5–8.1)

## 2020-06-02 LAB — PHOSPHORUS: Phosphorus: 4.2 mg/dL (ref 2.5–4.6)

## 2020-06-02 LAB — MAGNESIUM: Magnesium: 2.1 mg/dL (ref 1.7–2.4)

## 2020-06-02 NOTE — Progress Notes (Signed)
PROGRESS NOTE    Lance Ray  KWI:[id number] DOB: June 04, 1971 DOA: 05/07/2020 PCP: Patient, No Pcp Per   Brief Narrative:  The patient is a disheveled 49 year old Caucasian male with a past medical history significant for but not limited to dementia, agitation, and history of seizure disorder.  He was initially admitted on 03/24/2020 under hospitalist service for generalized tonic-clonic seizures due to noncompliance with Depakote.  He was loaded at that time with IV lorazepam and Keppra and admitted to the PCU.  He had a negative MRI brain and head CT and his hospital course was complicated by psychosis and agitation and required Precedex drip for agitation.  Psychiatry was consulted ultimately he was discharged to behavioral health on 04/09/2020 and he was IVC.  Hospitalist team was reconsulted by behavioral health after sitter heard the patient screaming witnessed a generalized clonic seizure that lasted about 2 minutes.  Subsequently given 2 mg of IV Ativan and the seizure subsided.  Neurology was also consulted at that time.  Patient was transferred back to the hospital service and currently does not have the capacity make decisions for himself as deemed by psychiatry.  Currently the patient is awaiting legal guardianship prior to discharge and this cannot be done until next month as he is a legal guardianship hearing in October.  Currently he remains medically stable without signs of psychosis does not have a safe discharge disposition as he cannot make decisions for himself and currently does not have a legal guardian to make decisions for him.  He will remain hospitalized until his legal guardianship hearing eventually in October need placement in a group home.  Assessment & Plan:   Principal Problem:   Seizure ([place]) Active Problems:   Psychosis ([place])  Breakthrough seizures, likely secondary to AEDs noncompliance- stable. -AEDs adjusted by neurology -Currently on Lamotrigine 175 mg twice  daily, Clonazepam 0.5 mg daily, Levitracetam 1500 mg twice daily. -Continue seizure precautions -C/w IV Lorazepam as needed for active seizures greater than 3 minutes as recommended by neurology -Neurology was consulted -Would benefit from home health RN to assist with his medications.  -TOC has been consulted to assist with home health services and guardianship.  -Need for guardianship is barrier to discharge, hearing pending next Month.   Vitamin D Deficiency -Last Vitamin D Level noted to be 17.26 -Started on Cholecalciferol 1000 IU daily.  Recheck vitamin D level in about 3 months.  Seizure disorder -Management per above -Checked Lamictal and Keppra levels and last Check on 8/29 showed a Lamotrigine Level of 5.6 and a Levetiracetam Level of 64.6 -Cont current regimen of keppra and lamictal.  Leukocytosis with Eosinophilia  -Not present on admission, mild.  Resolved.   -Unclear etiology and ? Reactive.  No clinical suspicion or evidence of infection.   -New on 8/25, was normal previously, WBC 11.4>>11.3>>12.2>>12.6 >>9.3; Now WBC is normalized and 5.8 -Differential includes allergic reaction, infections (especially parasitic), drug reaction, autoimmune conditions (sarcoidosis, rheumatoid arthritis, inflammatory bowel disease, IgG4 disease), inflammatory condition or some malignancies can also cause.   -He is without respiratory symptoms, skin findings, organomegaly or lymphadenopathy on exam. IgG4 is within normal limits. -CXR on 8/27 showed low lung volumes, with right basilar atelectasis and bandlike opacity in the left midlung, atelectasis versus scarring.  Mediastinal contours were normal without hilar lymphadenopathy. -Blood Cx showed NGTD at 5 Days & ACE level was 25 -ESR was 11  Agitation suspect related to alcohol withdrawal-resolved. -Admitted from behavioral health -No evidence of alcohol withdrawal  at the time of this visit. -Continue psych medications with  Olanzapine 5 mg po qHS and Haldol 2 mg po BID, multivitamins, thiamine and folic acid.  -Seen by psych no need for inpatient psych  Normocytic Anemia -Chronic and appears stable -Hgb/Hct is now 12.7/36.8 -Continue to Monitor for S/Sx of Bleeding; Currently no overt bleeding noted -Repeat CBC intermittently    DVT prophylaxis: Enoxaparin 40 mg sq q24h Code Status: FULL COD  Family Communication:  No family present at bedside  Disposition Plan: Pending Guardianship hearing  Status is: Inpatient  Remains inpatient appropriate because:Unsafe d/c plan   Dispo:  Patient From:  Home  Planned Disposition:  Group Home  Expected discharge date:  06/29/20  Medically stable for discharge:  YES   Consultants:   Neurology  Psychiatry    Procedures: None  Antimicrobials: Anti-infectives (From admission, onward)   None     Subjective: Seen and examined at bedside he denied any complaints.  No nausea or vomiting.  No other concerns or complaints at this time.  He is a little agitated that his guardianship hearing is next month and that he cannot go home.  No other concerns with this time.  Objective: Vitals:   06/01/20 1918 06/02/20 0001 06/02/20 0806 06/02/20 1119  BP: 102/68 106/74 92/70 101/67  Pulse: 79 75 67 66  Resp: 17 16 15 17   Temp: 98.4 F (36.9 C) 98.3 F (36.8 C) 98.3 F (36.8 C) 98.6 F (37 C)  TempSrc: Oral Oral Oral Oral  SpO2: 96% 96% 97% 98%  Weight:      Height:        Intake/Output Summary (Last 24 hours) at 06/02/2020 1159 Last data filed at 06/02/2020 0900 Gross per 24 hour  Intake 720 ml  Output --  Net 720 ml   Filed Weights   05/29/20 0500 05/30/20 0450 05/31/20 0452  Weight: 66.8 kg 64.3 kg 65.5 kg   Examination: Physical Exam:  Constitutional: WN/WD disheveled Caucasian male currently in NAD and appears calm and comfortable Eyes: Lids and conjunctivae normal, sclerae anicteric  ENMT: External Ears, Nose appear normal. Grossly normal  hearing.  Neck: Appears normal, supple, no cervical masses, normal ROM, no appreciable thyromegaly: No JVD Respiratory: Diminished to auscultation bilaterally, no wheezing, rales, rhonchi or crackles. Normal respiratory effort and patient is not tachypenic. No accessory muscle use.  Unlabored breathing Cardiovascular: RRR, no murmurs / rubs / gallops. S1 and S2 auscultated.  Abdomen: Soft, non-tender, non-distended. Bowel sounds positive.  GU: Deferred. Musculoskeletal: No clubbing / cyanosis of digits/nails. No joint deformity upper and lower extremities.   Skin: No rashes, lesions, ulcers on limited skin evaluation. No induration; Warm and dry.  Neurologic: CN 2-12 grossly intact with no focal deficits.  Romberg sign and cerebellar reflexes not assessed.  Psychiatric: Normal judgment and insight. Alert and oriented x 3.  A little agitated mood and appropriate affect.   Data Reviewed: I have personally reviewed following labs and imaging studies  CBC: Recent Labs  Lab 06/02/20 0851  WBC 5.8  NEUTROABS 3.4  HGB 12.7*  HCT 36.8*  MCV 84.2  PLT 353   Basic Metabolic Panel: Recent Labs  Lab 06/02/20 0851  NA 138  K 4.2  CL 108  CO2 23  GLUCOSE 96  BUN 24*  CREATININE 0.98  CALCIUM 8.9  MG 2.1  PHOS 4.2   GFR: Estimated Creatinine Clearance: 83.5 mL/min (by C-G formula based on SCr of 0.98 mg/dL). Liver Function Tests: Recent Labs  Lab 06/02/20 0851  AST 19  ALT 35  ALKPHOS 67  BILITOT 0.6  PROT 6.1*  ALBUMIN 3.8   No results for input(s): LIPASE, AMYLASE in the last 168 hours. No results for input(s): AMMONIA in the last 168 hours. Coagulation Profile: No results for input(s): INR, PROTIME in the last 168 hours. Cardiac Enzymes: No results for input(s): CKTOTAL, CKMB, CKMBINDEX, TROPONINI in the last 168 hours. BNP (last 3 results) No results for input(s): PROBNP in the last 8760 hours. HbA1C: No results for input(s): HGBA1C in the last 72 hours. CBG: No  results for input(s): GLUCAP in the last 168 hours. Lipid Profile: No results for input(s): CHOL, HDL, LDLCALC, TRIG, CHOLHDL, LDLDIRECT in the last 72 hours. Thyroid Function Tests: No results for input(s): TSH, T4TOTAL, FREET4, T3FREE, THYROIDAB in the last 72 hours. Anemia Panel: No results for input(s): VITAMINB12, FOLATE, FERRITIN, TIBC, IRON, RETICCTPCT in the last 72 hours. Sepsis Labs: No results for input(s): PROCALCITON, LATICACIDVEN in the last 168 hours.  No results found for this or any previous visit (from the past 240 hour(s)).   RN Pressure Injury Documentation:     Estimated body mass index is 21.31 kg/m as calculated from the following:   Height as of this encounter: 5' 9"  (1.753 m).   Weight as of this encounter: 65.5 kg.  Malnutrition Type:      Malnutrition Characteristics:      Nutrition Interventions:      Radiology Studies: No results found.  Scheduled Meds: . cholecalciferol  1,000 Units Oral Daily  . clonazepam  0.5 mg Oral BID  . enoxaparin (LOVENOX) injection  40 mg Subcutaneous Q24H  . haloperidol  2 mg Oral BID  . lamoTRIgine  175 mg Oral BID  . levETIRAcetam  1,500 mg Oral BID  . melatonin  5 mg Oral QHS  . OLANZapine  5 mg Oral QHS  . vitamin B-6  50 mg Oral Daily  . sodium chloride flush  10 mL Intravenous Q12H  . thiamine  100 mg Oral Daily  . vitamin B-12  1,000 mcg Oral Daily   Continuous Infusions:   LOS: 26 days   Kerney Elbe, DO [facility] PAGER is on AMION  If 7PM-7AM, please contact night-coverage [email or web address]

## 2020-06-03 NOTE — Plan of Care (Signed)
  Problem: Activity: Goal: Risk for activity intolerance will decrease Outcome: Progressing   Problem: Coping: Goal: Level of anxiety will decrease Outcome: Progressing   Problem: Pain Managment: Goal: General experience of comfort will improve Outcome: Progressing   Problem: Safety: Goal: Ability to remain free from injury will improve Outcome: Progressing   Problem: Medication: Goal: Risk for medication side effects will decrease Outcome: Progressing

## 2020-06-03 NOTE — Plan of Care (Signed)
  Problem: Activity: Goal: Risk for activity intolerance will decrease Outcome: Progressing   Problem: Coping: Goal: Level of anxiety will decrease Outcome: Progressing   Problem: Pain Managment: Goal: General experience of comfort will improve Outcome: Progressing   Problem: Safety: Goal: Ability to remain free from injury will improve Outcome: Progressing   Problem: Medication: Goal: Risk for medication side effects will decrease Outcome: Progressing   

## 2020-06-03 NOTE — Progress Notes (Signed)
PROGRESS NOTE    Lance Ray  OMV:[id number] DOB: [birth year] DOA: 05/07/2020 PCP: Patient, No Pcp Per   Brief Narrative:  The patient is a disheveled 49 year old Caucasian male with a past medical history significant for but not limited to dementia, agitation, and history of seizure disorder.  He was initially admitted on 03/24/2020 under hospitalist service for generalized tonic-clonic seizures due to noncompliance with Depakote.  He was loaded at that time with IV lorazepam and Keppra and admitted to the PCU.  He had a negative MRI brain and head CT and his hospital course was complicated by psychosis and agitation and required Precedex drip for agitation.  Psychiatry was consulted ultimately he was discharged to behavioral health on 04/09/2020 and he was IVC.  Hospitalist team was reconsulted by behavioral health after sitter heard the patient screaming witnessed a generalized clonic seizure that lasted about 2 minutes.  Subsequently given 2 mg of IV Ativan and the seizure subsided.  Neurology was also consulted at that time.  Patient was transferred back to the hospital service and currently does not have the capacity make decisions for himself as deemed by psychiatry.  Currently the patient is awaiting legal guardianship prior to discharge and this cannot be done until next month as he is a legal guardianship hearing in October.  Currently he remains medically stable without signs of psychosis does not have a safe discharge disposition as he cannot make decisions for himself and currently does not have a legal guardian to make decisions for him.  He will remain hospitalized until his legal guardianship hearing eventually in October need placement in a group home.  Assessment & Plan:   Principal Problem:   Seizure ([place]) Active Problems:   Psychosis ([place])  Breakthrough seizures, likely secondary to AEDs noncompliance- stable. -AEDs adjusted by neurology -Currently on Lamotrigine 175 mg twice  daily, Clonazepam 0.5 mg daily, Levitracetam 1500 mg twice daily. -Continue seizure precautions -C/w IV Lorazepam as needed for active seizures greater than 3 minutes as recommended by neurology -Neurology was consulted -Would benefit from home health RN to assist with his medications.  -TOC has been consulted to assist with home health services and guardianship.  -Need for guardianship is barrier to discharge, hearing pending next Month.   Vitamin D Deficiency -Last Vitamin D Level noted to be 17.26 -Started on Cholecalciferol 1000 IU daily.  Recheck vitamin D level in about 3 months.  Seizure disorder -Management per above -Checked Lamictal and Keppra levels and last Check on 8/29 showed a Lamotrigine Level of 5.6 and a Levetiracetam Level of 64.6 -Cont current regimen of keppra and lamictal.  Leukocytosis with Eosinophilia  -Not present on admission, mild.  Resolved.   -Unclear etiology and ? Reactive.  No clinical suspicion or evidence of infection.   -New on 8/25, was normal previously, WBC 11.4>>11.3>>12.2>>12.6 >>9.3; Now WBC is normalized and 5.8 on last check (06/02/20) -Differential includes allergic reaction, infections (especially parasitic), drug reaction, autoimmune conditions (sarcoidosis, rheumatoid arthritis, inflammatory bowel disease, IgG4 disease), inflammatory condition or some malignancies can also cause.   -He is without respiratory symptoms, skin findings, organomegaly or lymphadenopathy on exam. IgG4 is within normal limits. -CXR on 8/27 showed low lung volumes, with right basilar atelectasis and bandlike opacity in the left midlung, atelectasis versus scarring.  Mediastinal contours were normal without hilar lymphadenopathy. -Blood Cx showed NGTD at 5 Days & ACE level was 25 -ESR was 11  Agitation suspect related to alcohol withdrawal-resolved. -Admitted from behavioral health -No  evidence of alcohol withdrawal at the time of this visit. -Continue psych  medications with Olanzapine 5 mg po qHS and Haldol 2 mg po BID, multivitamins, thiamine and folic acid.  -Seen by psych no need for inpatient psych  Normocytic Anemia -Chronic and appears stable -Hgb/Hct is now 12.7/36.8 on last check 06/02/20 -Continue to Monitor for S/Sx of Bleeding; Currently no overt bleeding noted -Repeat CBC intermittently   DVT prophylaxis: Enoxaparin 40 mg sq q24h Code Status: FULL CODE Family Communication:  No family present at bedside  Disposition Plan: Pending Guardianship hearing  Status is: Inpatient  Remains inpatient appropriate because:Unsafe d/c plan   Dispo:  Patient From:  Home  Planned Disposition:  Group Home  Expected discharge date:  06/29/20  Medically stable for discharge:  YES   Consultants:   Neurology  Psychiatry    Procedures: None  Antimicrobials: Anti-infectives (From admission, onward)   None     Subjective: Seen and examined at bedside and he had no complaints.  He denies any chest pain, lightheadedness or dizziness.  States that he slept okay.  No other concerns or complaints at this time.  Objective: Vitals:   06/02/20 2008 06/02/20 2300 06/03/20 0453 06/03/20 0743  BP: 100/72 99/66 100/72 95/63  Pulse: 81 76 69 64  Resp: 17 16 15 16   Temp: 98.4 F (36.9 C) 97.8 F (36.6 C) 98.4 F (36.9 C) 98.2 F (36.8 C)  TempSrc: Oral Oral Oral   SpO2: 99% 99% 97% 95%  Weight:      Height:        Intake/Output Summary (Last 24 hours) at 06/03/2020 0841 Last data filed at 06/02/2020 1900 Gross per 24 hour  Intake 720 ml  Output --  Net 720 ml   Filed Weights   05/29/20 0500 05/30/20 0450 05/31/20 0452  Weight: 66.8 kg 64.3 kg 65.5 kg   Examination:  Physical Exam:  Constitutional:, Well-developed slightly disheveled Caucasian male currently no acute distress appears calm and comfortable Respiratory: Mildly diminished to auscultation bilaterally with no appreciable wheezing, rales, rhonchi.  Patient has  unlabored breathing and a normal respiratory effort Cardiovascular: Regular rate and rhythm. Abdomen: Soft, nontender, nondistended.  Bowel sounds positive GU: Deferred Neurologic: Cranial nerves II through XII grossly intact.  Moves it extremities independently.  Data Reviewed: I have personally reviewed following labs and imaging studies  CBC: Recent Labs  Lab 06/02/20 0851  WBC 5.8  NEUTROABS 3.4  HGB 12.7*  HCT 36.8*  MCV 84.2  PLT 503   Basic Metabolic Panel: Recent Labs  Lab 06/02/20 0851  NA 138  K 4.2  CL 108  CO2 23  GLUCOSE 96  BUN 24*  CREATININE 0.98  CALCIUM 8.9  MG 2.1  PHOS 4.2   GFR: Estimated Creatinine Clearance: 83.5 mL/min (by C-G formula based on SCr of 0.98 mg/dL). Liver Function Tests: Recent Labs  Lab 06/02/20 0851  AST 19  ALT 35  ALKPHOS 67  BILITOT 0.6  PROT 6.1*  ALBUMIN 3.8   No results for input(s): LIPASE, AMYLASE in the last 168 hours. No results for input(s): AMMONIA in the last 168 hours. Coagulation Profile: No results for input(s): INR, PROTIME in the last 168 hours. Cardiac Enzymes: No results for input(s): CKTOTAL, CKMB, CKMBINDEX, TROPONINI in the last 168 hours. BNP (last 3 results) No results for input(s): PROBNP in the last 8760 hours. HbA1C: No results for input(s): HGBA1C in the last 72 hours. CBG: No results for input(s): GLUCAP in the  last 168 hours. Lipid Profile: No results for input(s): CHOL, HDL, LDLCALC, TRIG, CHOLHDL, LDLDIRECT in the last 72 hours. Thyroid Function Tests: No results for input(s): TSH, T4TOTAL, FREET4, T3FREE, THYROIDAB in the last 72 hours. Anemia Panel: No results for input(s): VITAMINB12, FOLATE, FERRITIN, TIBC, IRON, RETICCTPCT in the last 72 hours. Sepsis Labs: No results for input(s): PROCALCITON, LATICACIDVEN in the last 168 hours.  No results found for this or any previous visit (from the past 240 hour(s)).   RN Pressure Injury Documentation:     Estimated body mass  index is 21.31 kg/m as calculated from the following:   Height as of this encounter: 5' 9"  (1.753 m).   Weight as of this encounter: 65.5 kg.  Malnutrition Type:      Malnutrition Characteristics:      Nutrition Interventions:      Radiology Studies: No results found.  Scheduled Meds:  cholecalciferol  1,000 Units Oral Daily   clonazepam  0.5 mg Oral BID   enoxaparin (LOVENOX) injection  40 mg Subcutaneous Q24H   haloperidol  2 mg Oral BID   lamoTRIgine  175 mg Oral BID   levETIRAcetam  1,500 mg Oral BID   melatonin  5 mg Oral QHS   OLANZapine  5 mg Oral QHS   vitamin B-6  50 mg Oral Daily   sodium chloride flush  10 mL Intravenous Q12H   thiamine  100 mg Oral Daily   vitamin B-12  1,000 mcg Oral Daily   Continuous Infusions:   LOS: 27 days   Kerney Elbe, DO [facility] PAGER is on AMION  If 7PM-7AM, please contact night-coverage [email or web address]

## 2020-06-03 NOTE — TOC Progression Note (Signed)
Transition of Care [facility]) - Progression Note    Patient Details  Name: ATREYU MAK MRN: [id number] Date of Birth: 02/27/1971  Transition of [facility]) CM/SW Contact  Barrie Dunker, RN Phone Number: 06/03/2020, 10:18 AM  Clinical Narrative:   The patient was supposed to have a court date on sept 7th however The guardian ad litem had it continued the new court date is oct 19th at 2 pm The patient is determined to not have capacity The sister called Ephriam Jenkins [phone] She stated that she started the process for Medicaid She does not currently have Guardianship but is attempting to get it.  He has no payor source at this time and is not able to go home. He will most likely be here in the hospital until after the Guardianship hearing.          Expected Discharge Plan and Services                                                 Social Determinants of Health (SDOH) Interventions    Readmission Risk Interventions Readmission Risk Prevention Plan 03/29/2020  Transportation Screening Complete  HRI or Home Care Consult Complete  Social Work Consult for Recovery Care Planning/Counseling Complete  Palliative Care Screening Not Applicable  Medication Review [facility]) Referral to Pharmacy  Some recent data might be hidden

## 2020-06-03 NOTE — Progress Notes (Signed)
Asked patient if he would like to bathe this morning.  He said yes.  He refused linen change at this time and I gave him towels, washclothes, gown, deodorant, soap.  Patient is independent and says he will wash shortly.

## 2020-06-04 NOTE — Progress Notes (Signed)
PROGRESS NOTE    Lance DORFMAN  DDU:[id number] DOB: October 21, 1970 DOA: 05/07/2020 PCP: Patient, No Pcp Per   Brief Narrative:  The patient is a disheveled 49 year old Caucasian male with a past medical history significant for but not limited to dementia, agitation, and history of seizure disorder.  He was initially admitted on 03/24/2020 under hospitalist service for generalized tonic-clonic seizures due to noncompliance with Depakote.  He was loaded at that time with IV lorazepam and Keppra and admitted to the PCU.  He had a negative MRI brain and head CT and his hospital course was complicated by psychosis and agitation and required Precedex drip for agitation.  Psychiatry was consulted ultimately he was discharged to behavioral health on 04/09/2020 and he was IVC.  Hospitalist team was reconsulted by behavioral health after sitter heard the patient screaming witnessed a generalized clonic seizure that lasted about 2 minutes.  Subsequently given 2 mg of IV Ativan and the seizure subsided.  Neurology was also consulted at that time.  Patient was transferred back to the hospital service and currently does not have the capacity make decisions for himself as deemed by psychiatry.  Currently the patient is awaiting legal guardianship prior to discharge and this cannot be done until next month as he is a legal guardianship hearing in October.  Currently he remains medically stable without signs of psychosis does not have a safe discharge disposition as he cannot make decisions for himself and currently does not have a legal guardian to make decisions for him.  He will remain hospitalized until his legal guardianship hearing eventually on October 19th as he needs placement in a group home.  Assessment & Plan:   Principal Problem:   Seizure ([place]) Active Problems:   Psychosis ([place])  Breakthrough seizures, likely secondary to AEDs noncompliance- stable. -AEDs adjusted by neurology -Currently on Lamotrigine  175 mg twice daily, Clonazepam 0.5 mg daily, Levitracetam 1500 mg twice daily. -Continue seizure precautions -C/w IV Lorazepam as needed for active seizures greater than 3 minutes as recommended by neurology -Neurology was consulted -Would benefit from home health RN to assist with his medications.  -TOC has been consulted to assist with home health services and guardianship.  -Need for guardianship is barrier to discharge, hearing pending next Month on October 19th 2021.   Vitamin D Deficiency -Last Vitamin D Level noted to be 17.26 -Started on Cholecalciferol 1000 IU daily.  Recheck vitamin D level in about 3 months.  Seizure disorder -Management per above -Checked Lamictal and Keppra levels and last Check on 8/29 showed a Lamotrigine Level of 5.6 and a Levetiracetam Level of 64.6 -Cont current regimen of keppra and lamictal.  Leukocytosis with Eosinophilia  -Not present on admission, mild.  Resolved.   -Unclear etiology and ? Reactive.  No clinical suspicion or evidence of infection.   -New on 8/25, was normal previously, WBC 11.4>>11.3>>12.2>>12.6 >>9.3; Now WBC is normalized and 5.8 on last check (06/02/20) -Differential includes allergic reaction, infections (especially parasitic), drug reaction, autoimmune conditions (sarcoidosis, rheumatoid arthritis, inflammatory bowel disease, IgG4 disease), inflammatory condition or some malignancies can also cause.   -He is without respiratory symptoms, skin findings, organomegaly or lymphadenopathy on exam. IgG4 is within normal limits. -CXR on 8/27 showed low lung volumes, with right basilar atelectasis and bandlike opacity in the left midlung, atelectasis versus scarring.  Mediastinal contours were normal without hilar lymphadenopathy. -Blood Cx showed NGTD at 5 Days & ACE level was 25 -ESR was 11  Agitation suspect related to  alcohol withdrawal-resolved. -Admitted from behavioral health -No evidence of alcohol withdrawal at the  time of this visit. -Continue psych medications with Olanzapine 5 mg po qHS and Haldol 2 mg po BID, multivitamins, thiamine and folic acid.  -Seen by psych no need for inpatient psych  Normocytic Anemia -Chronic and appears stable -Hgb/Hct is now 12.7/36.8 on last check 06/02/20 -Continue to Monitor for S/Sx of Bleeding; Currently no overt bleeding noted -Repeat CBC intermittently   DVT prophylaxis: Enoxaparin 40 mg sq q24h Code Status: FULL CODE Family Communication:  No family present at bedside  Disposition Plan: Pending Guardianship hearing  Status is: Inpatient  Remains inpatient appropriate because:Unsafe d/c plan   Dispo:  Patient From:  Home  Planned Disposition:  Group Home  Expected discharge date:  06/29/20  Medically stable for discharge:  YES   Consultants:   Neurology  Psychiatry    Procedures: None  Antimicrobials: Anti-infectives (From admission, onward)   None     Subjective: Seen and examined at bedside and he had no complaints. Denied any chest pain, shortness of breath, nausea, vomiting.  Felt okay.  No other concerns or complaints at this time.  Objective: Vitals:   06/03/20 1955 06/03/20 2358 06/04/20 0423 06/04/20 0744  BP: 104/71 103/71 102/73 104/73  Pulse: 91 89 73 79  Resp: 16 17 16 17   Temp: 99 F (37.2 C) 99.1 F (37.3 C) 98.6 F (37 C) 97.9 F (36.6 C)  TempSrc: Oral Oral Oral   SpO2: 96% 96% 99% 98%  Weight:      Height:        Intake/Output Summary (Last 24 hours) at 06/04/2020 6378 Last data filed at 06/03/2020 1350 Gross per 24 hour  Intake 480 ml  Output --  Net 480 ml   Filed Weights   05/29/20 0500 05/30/20 0450 05/31/20 0452  Weight: 66.8 kg 64.3 kg 65.5 kg   Examination:  Physical Exam:  Constitutional:, Well-developed, slightly disheveled Caucasian male currently no acute distress appears calm Respiratory: Slightly diminished to auscultation bilaterally no appreciable wheezing, rales, cough.  Patient has  unlabored breathing and a normal respiratory effort Cardiovascular: Regular rate and rhythm.  S1-S2 normal Abdomen: Soft, nontender, nondistended.  Bowel sounds present GU: Deferred Neurologic: Cranial nerves II through XII grossly intact no appreciable focal deficits.  He moves his extremities independently.  Data Reviewed: I have personally reviewed following labs and imaging studies  CBC: Recent Labs  Lab 06/02/20 0851  WBC 5.8  NEUTROABS 3.4  HGB 12.7*  HCT 36.8*  MCV 84.2  PLT 588   Basic Metabolic Panel: Recent Labs  Lab 06/02/20 0851  NA 138  K 4.2  CL 108  CO2 23  GLUCOSE 96  BUN 24*  CREATININE 0.98  CALCIUM 8.9  MG 2.1  PHOS 4.2   GFR: Estimated Creatinine Clearance: 83.5 mL/min (by C-G formula based on SCr of 0.98 mg/dL). Liver Function Tests: Recent Labs  Lab 06/02/20 0851  AST 19  ALT 35  ALKPHOS 67  BILITOT 0.6  PROT 6.1*  ALBUMIN 3.8   No results for input(s): LIPASE, AMYLASE in the last 168 hours. No results for input(s): AMMONIA in the last 168 hours. Coagulation Profile: No results for input(s): INR, PROTIME in the last 168 hours. Cardiac Enzymes: No results for input(s): CKTOTAL, CKMB, CKMBINDEX, TROPONINI in the last 168 hours. BNP (last 3 results) No results for input(s): PROBNP in the last 8760 hours. HbA1C: No results for input(s): HGBA1C in the last 72  hours. CBG: No results for input(s): GLUCAP in the last 168 hours. Lipid Profile: No results for input(s): CHOL, HDL, LDLCALC, TRIG, CHOLHDL, LDLDIRECT in the last 72 hours. Thyroid Function Tests: No results for input(s): TSH, T4TOTAL, FREET4, T3FREE, THYROIDAB in the last 72 hours. Anemia Panel: No results for input(s): VITAMINB12, FOLATE, FERRITIN, TIBC, IRON, RETICCTPCT in the last 72 hours. Sepsis Labs: No results for input(s): PROCALCITON, LATICACIDVEN in the last 168 hours.  No results found for this or any previous visit (from the past 240 hour(s)).   RN Pressure  Injury Documentation:     Estimated body mass index is 21.31 kg/m as calculated from the following:   Height as of this encounter: 5' 9"  (1.753 m).   Weight as of this encounter: 65.5 kg.  Malnutrition Type:      Malnutrition Characteristics:      Nutrition Interventions:      Radiology Studies: No results found.  Scheduled Meds: . cholecalciferol  1,000 Units Oral Daily  . clonazepam  0.5 mg Oral BID  . enoxaparin (LOVENOX) injection  40 mg Subcutaneous Q24H  . haloperidol  2 mg Oral BID  . lamoTRIgine  175 mg Oral BID  . levETIRAcetam  1,500 mg Oral BID  . melatonin  5 mg Oral QHS  . OLANZapine  5 mg Oral QHS  . vitamin B-6  50 mg Oral Daily  . sodium chloride flush  10 mL Intravenous Q12H  . thiamine  100 mg Oral Daily  . vitamin B-12  1,000 mcg Oral Daily   Continuous Infusions:   LOS: 28 days   Kerney Elbe, DO [facility] PAGER is on AMION  If 7PM-7AM, please contact night-coverage [email or web address]

## 2020-06-05 MED ORDER — LEVETIRACETAM ER 500 MG PO TB24
1500.0000 mg | ORAL_TABLET | Freq: Two times a day (BID) | ORAL | Status: DC
  Administered 2020-06-05 – 2020-06-27 (×45): 1500 mg via ORAL
  Filled 2020-06-05 (×51): qty 3

## 2020-06-05 NOTE — Progress Notes (Signed)
PROGRESS NOTE    BUREN HAVEY  WUJ:[id number] DOB: February 28, 1971 DOA: 05/07/2020 PCP: Patient, No Pcp Per   Brief Narrative:  The patient is a disheveled 49 year old Caucasian male with a past medical history significant for but not limited to dementia, agitation, and history of seizure disorder.  He was initially admitted on 03/24/2020 under hospitalist service for generalized tonic-clonic seizures due to noncompliance with Depakote.  He was loaded at that time with IV lorazepam and Keppra and admitted to the PCU.  He had a negative MRI brain and head CT and his hospital course was complicated by psychosis and agitation and required Precedex drip for agitation.  Psychiatry was consulted ultimately he was discharged to behavioral health on 04/09/2020 and he was IVC.  Hospitalist team was reconsulted by behavioral health after sitter heard the patient screaming witnessed a generalized clonic seizure that lasted about 2 minutes.  Subsequently given 2 mg of IV Ativan and the seizure subsided.  Neurology was also consulted at that time.  Patient was transferred back to the hospital service and currently does not have the capacity make decisions for himself as deemed by psychiatry.  Currently the patient is awaiting legal guardianship prior to discharge and this cannot be done until next month as he is a legal guardianship hearing in October.  Currently he remains medically stable without signs of psychosis does not have a safe discharge disposition as he cannot make decisions for himself and currently does not have a legal guardian to make decisions for him.  He will remain hospitalized until his legal guardianship hearing eventually on October 19th as he needs placement in a group home.  Assessment & Plan:   Principal Problem:   Seizure ([place]) Active Problems:   Psychosis ([place])  Breakthrough seizures, likely secondary to AEDs noncompliance- stable. -AEDs adjusted by neurology -Currently on Lamotrigine  175 mg twice daily, Clonazepam 0.5 mg daily, Levitracetam 1500 mg twice daily. -Continue seizure precautions -C/w IV Lorazepam as needed for active seizures greater than 3 minutes as recommended by neurology -Neurology was consulted -Would benefit from home health RN to assist with his medications.  -TOC has been consulted to assist with home health services and guardianship.  -Need for guardianship is barrier to discharge, hearing pending next Month on October 19th 2021.  Vitamin D Deficiency -Last Vitamin D Level noted to be 17.26 -Started on Cholecalciferol 1000 IU daily.  Recheck vitamin D level in about 3 months.  Seizure disorder -Management per above -Checked Lamictal and Keppra levels and last Check on 8/29 showed a Lamotrigine Level of 5.6 and a Levetiracetam Level of 64.6 -Cont current regimen of keppra and lamictal.  Leukocytosis with Eosinophilia  -Not present on admission, mild.  Resolved.   -Unclear etiology and ? Reactive.  No clinical suspicion or evidence of infection.   -New on 8/25, was normal previously, WBC 11.4>>11.3>>12.2>>12.6 >>9.3; Now WBC is normalized and 5.8 on last check (06/02/20) -Differential includes allergic reaction, infections (especially parasitic), drug reaction, autoimmune conditions (sarcoidosis, rheumatoid arthritis, inflammatory bowel disease, IgG4 disease), inflammatory condition or some malignancies can also cause.   -He is without respiratory symptoms, skin findings, organomegaly or lymphadenopathy on exam. IgG4 is within normal limits. -CXR on 8/27 showed low lung volumes, with right basilar atelectasis and bandlike opacity in the left midlung, atelectasis versus scarring.  Mediastinal contours were normal without hilar lymphadenopathy. -Blood Cx showed NGTD at 5 Days & ACE level was 25 -ESR was 11  Agitation suspect related to alcohol  withdrawal-resolved. -Admitted from behavioral health -No evidence of alcohol withdrawal at the time  of this visit. -Continue psych medications with Olanzapine 5 mg po qHS and Haldol 2 mg po BID, multivitamins, thiamine and folic acid.  -Seen by psych no need for inpatient psych  Normocytic Anemia -Chronic and appears stable -Hgb/Hct is now 12.7/36.8 on last check 06/02/20 -Continue to Monitor for S/Sx of Bleeding; Currently no overt bleeding noted -Repeat CBC intermittently   DVT prophylaxis: Enoxaparin 40 mg sq q24h Code Status: FULL CODE Family Communication:  No family present at bedside  Disposition Plan: Pending Guardianship hearing  Status is: Inpatient  Remains inpatient appropriate because:Unsafe d/c plan   Dispo:  Patient From:  Home  Planned Disposition:  Group Home  Expected discharge date:  07/14/20  Medically stable for discharge:  YES   Consultants:   Neurology  Psychiatry    Procedures: None  Antimicrobials: Anti-infectives (From admission, onward)   None     Subjective: Seen and examined at bedside and he had no complaints.  He is resting comfortably.  States he has not had a bowel movement today but had one yesterday.  No nausea or vomiting.  No other concerns or complaints at this time.  Objective: Vitals:   06/04/20 1522 06/04/20 2025 06/05/20 0019 06/05/20 0359  BP: 108/71 92/62 99/63  101/71  Pulse: 88 82 79 70  Resp: 18 18 17 16   Temp: 97.6 F (36.4 C) 98.4 F (36.9 C) 98.8 F (37.1 C) 98.4 F (36.9 C)  TempSrc:  Oral Oral Oral  SpO2: 96% 96% 96% 98%  Weight:      Height:        Intake/Output Summary (Last 24 hours) at 06/05/2020 0803 Last data filed at 06/04/2020 2307 Gross per 24 hour  Intake 490 ml  Output --  Net 490 ml   Filed Weights   05/29/20 0500 05/30/20 0450 05/31/20 0452  Weight: 66.8 kg 64.3 kg 65.5 kg   Examination:  Physical Exam:  Constitutional: Well-nourished, well-developed slightly disheveled Caucasian male currently in no acute distress and appears calm Respiratory: Diminished to auscultation  bilaterally with no appreciable wheezing, rales, rhonchi.  Unlabored breathing and is not wearing any supplemental oxygen via nasal cannula Cardiovascular: Regular rate and rhythm.  No appreciable murmurs, rubs, gallops. Abdomen: Soft, nontender, nondistended.  Bowel sounds present GU: Deferred Neurologic: Cranial nerves II through XII grossly intact no appreciable focal deficits  Data Reviewed: I have personally reviewed following labs and imaging studies  CBC: Recent Labs  Lab 06/02/20 0851  WBC 5.8  NEUTROABS 3.4  HGB 12.7*  HCT 36.8*  MCV 84.2  PLT 867   Basic Metabolic Panel: Recent Labs  Lab 06/02/20 0851  NA 138  K 4.2  CL 108  CO2 23  GLUCOSE 96  BUN 24*  CREATININE 0.98  CALCIUM 8.9  MG 2.1  PHOS 4.2   GFR: Estimated Creatinine Clearance: 83.5 mL/min (by C-G formula based on SCr of 0.98 mg/dL). Liver Function Tests: Recent Labs  Lab 06/02/20 0851  AST 19  ALT 35  ALKPHOS 67  BILITOT 0.6  PROT 6.1*  ALBUMIN 3.8   No results for input(s): LIPASE, AMYLASE in the last 168 hours. No results for input(s): AMMONIA in the last 168 hours. Coagulation Profile: No results for input(s): INR, PROTIME in the last 168 hours. Cardiac Enzymes: No results for input(s): CKTOTAL, CKMB, CKMBINDEX, TROPONINI in the last 168 hours. BNP (last 3 results) No results for input(s): PROBNP in  the last 8760 hours. HbA1C: No results for input(s): HGBA1C in the last 72 hours. CBG: No results for input(s): GLUCAP in the last 168 hours. Lipid Profile: No results for input(s): CHOL, HDL, LDLCALC, TRIG, CHOLHDL, LDLDIRECT in the last 72 hours. Thyroid Function Tests: No results for input(s): TSH, T4TOTAL, FREET4, T3FREE, THYROIDAB in the last 72 hours. Anemia Panel: No results for input(s): VITAMINB12, FOLATE, FERRITIN, TIBC, IRON, RETICCTPCT in the last 72 hours. Sepsis Labs: No results for input(s): PROCALCITON, LATICACIDVEN in the last 168 hours.  No results found for this  or any previous visit (from the past 240 hour(s)).   RN Pressure Injury Documentation:     Estimated body mass index is 21.31 kg/m as calculated from the following:   Height as of this encounter: 5' 9"  (1.753 m).   Weight as of this encounter: 65.5 kg.  Malnutrition Type:      Malnutrition Characteristics:      Nutrition Interventions:      Radiology Studies: No results found.  Scheduled Meds: . cholecalciferol  1,000 Units Oral Daily  . clonazepam  0.5 mg Oral BID  . enoxaparin (LOVENOX) injection  40 mg Subcutaneous Q24H  . haloperidol  2 mg Oral BID  . lamoTRIgine  175 mg Oral BID  . levETIRAcetam  1,500 mg Oral BID  . melatonin  5 mg Oral QHS  . OLANZapine  5 mg Oral QHS  . vitamin B-6  50 mg Oral Daily  . sodium chloride flush  10 mL Intravenous Q12H  . thiamine  100 mg Oral Daily  . vitamin B-12  1,000 mcg Oral Daily   Continuous Infusions:   LOS: 29 days   Kerney Elbe, DO [facility] PAGER is on AMION  If 7PM-7AM, please contact night-coverage [email or web address]

## 2020-06-06 NOTE — Plan of Care (Signed)
  Problem: Coping: Goal: Ability to adjust to condition or change in health will improve Outcome: Progressing Goal: Ability to identify appropriate support needs will improve Outcome: Progressing   Problem: Health Behavior/Discharge Planning: Goal: Compliance with prescribed medication regimen will improve Outcome: Progressing   Problem: Safety: Goal: Verbalization of understanding the information provided will improve Outcome: Progressing   Problem: Self-Concept: Goal: Ability to verbalize feelings about condition will improve Outcome: Progressing

## 2020-06-06 NOTE — Progress Notes (Signed)
PROGRESS NOTE    Lance Ray  KGU:[id number] DOB: 1970/12/26 DOA: 05/07/2020 PCP: Patient, No Pcp Per   Brief Narrative:  The patient is a disheveled 49 year old Caucasian male with a past medical history significant for but not limited to dementia, agitation, and history of seizure disorder.  He was initially admitted on 03/24/2020 under hospitalist service for generalized tonic-clonic seizures due to noncompliance with Depakote.  He was loaded at that time with IV lorazepam and Keppra and admitted to the PCU.  He had a negative MRI brain and head CT and his hospital course was complicated by psychosis and agitation and required Precedex drip for agitation.  Psychiatry was consulted ultimately he was discharged to behavioral health on 04/09/2020 and he was IVC.  Hospitalist team was reconsulted by behavioral health after sitter heard the patient screaming witnessed a generalized clonic seizure that lasted about 2 minutes.  Subsequently given 2 mg of IV Ativan and the seizure subsided.  Neurology was also consulted at that time.  Patient was transferred back to the hospital service and currently does not have the capacity make decisions for himself as deemed by psychiatry.  Currently the patient is awaiting legal guardianship prior to discharge and this cannot be done until next month as he is a legal guardianship hearing in October.  Currently he remains medically stable without signs of psychosis does not have a safe discharge disposition as he cannot make decisions for himself and currently does not have a legal guardian to make decisions for him.  He will remain hospitalized until his legal guardianship hearing eventually on October 19th as he needs placement in a group home.  Assessment & Plan:   Principal Problem:   Seizure ([place]) Active Problems:   Psychosis ([place])  Breakthrough seizures, likely secondary to AEDs noncompliance- stable. -AEDs adjusted by neurology -Currently on Lamotrigine  175 mg twice daily, Clonazepam 0.5 mg daily, Levitracetam 1500 mg twice daily. -Continue seizure precautions -C/w IV Lorazepam as needed for active seizures greater than 3 minutes as recommended by neurology -Neurology was consulted -Would benefit from home health RN to assist with his medications.  -TOC has been consulted to assist with home health services and guardianship.  -Need for guardianship is barrier to discharge, hearing pending next Month on October 19th 2021.  Vitamin D Deficiency -Last Vitamin D Level noted to be 17.26 -Started on Cholecalciferol 1000 IU daily.  Recheck vitamin D level in about 3 months.  Seizure Disorder -Management per above -Checked Lamictal and Keppra levels and last Check on 8/29 showed a Lamotrigine Level of 5.6 and a Levetiracetam Level of 64.6 -Cont current regimen of keppra and lamictal.  Leukocytosis with Eosinophilia  -Not present on admission, mild.  Resolved.   -Unclear etiology and ? Reactive.  No clinical suspicion or evidence of infection.   -New on 8/25, was normal previously, WBC 11.4>>11.3>>12.2>>12.6 >>9.3; Now WBC is normalized and 5.8 on last check (06/02/20) -Differential includes allergic reaction, infections (especially parasitic), drug reaction, autoimmune conditions (sarcoidosis, rheumatoid arthritis, inflammatory bowel disease, IgG4 disease), inflammatory condition or some malignancies can also cause.   -He is without respiratory symptoms, skin findings, organomegaly or lymphadenopathy on exam. IgG4 is within normal limits. -CXR on 8/27 showed low lung volumes, with right basilar atelectasis and bandlike opacity in the left midlung, atelectasis versus scarring.  Mediastinal contours were normal without hilar lymphadenopathy. -Blood Cx showed NGTD at 5 Days & ACE level was 25 -ESR was 11  Agitation suspect related to alcohol  withdrawal-resolved. -Admitted from behavioral health -No evidence of alcohol withdrawal at the time  of this visit. -Continue psych medications with Olanzapine 5 mg po qHS and Haldol 2 mg po BID, multivitamins, thiamine and folic acid.  -Seen by psych no need for inpatient psych  Normocytic Anemia -Chronic and appears stable -Hgb/Hct is now 12.7/36.8 on last check 06/02/20 -Continue to Monitor for S/Sx of Bleeding; Currently no overt bleeding noted -Repeat CBC intermittently   DVT prophylaxis: Enoxaparin 40 mg sq q24h Code Status: FULL CODE Family Communication:  No family present at bedside  Disposition Plan: Pending Guardianship hearing  Status is: Inpatient  Remains inpatient appropriate because:Unsafe d/c plan   Dispo:  Patient From:  Home  Planned Disposition:  Group Home  Expected discharge date:  07/14/20  Medically stable for discharge:  YES   Consultants:   Neurology  Psychiatry    Procedures: None  Antimicrobials: Anti-infectives (From admission, onward)   None     Subjective: Seen and examined at bedside and the patient was resting comfortably in the bed and states that he slept last night.  No nausea or vomiting.  No lightheadedness or dizziness.  No other concerns or complaints at this time.  Objective: Vitals:   06/05/20 0359 06/05/20 0812 06/05/20 1230 06/05/20 2352  BP: 101/71 95/61 100/70 91/68  Pulse: 70 68 75 73  Resp: 16 18 18 16   Temp: 98.4 F (36.9 C) 98.5 F (36.9 C) 98.1 F (36.7 C) 98.2 F (36.8 C)  TempSrc: Oral  Oral Oral  SpO2: 98% 97% 98% 97%  Weight:      Height:        Intake/Output Summary (Last 24 hours) at 06/06/2020 0807 Last data filed at 06/05/2020 1400 Gross per 24 hour  Intake 480 ml  Output --  Net 480 ml   Filed Weights   05/29/20 0500 05/30/20 0450 05/31/20 0452  Weight: 66.8 kg 64.3 kg 65.5 kg   Examination:  Physical Exam:  Constitutional: The patient is well-nourished, well-developed slightly disheveled Caucasian male currently no acute distress appears calm Respiratory: Slightly diminished to  auscultation bilaterally no appreciable wheezing, rales, rhonchi.  Patient has unlabored breathing is not wearing a submental oxygen via nasal cannula Cardiovascular: Regular rate and rhythm.  No appreciable murmurs, rubs, gallops. Abdomen: Nontender, nondistended.  Bowel sounds present GU: Deferred Neurologic: Cranial nerves II through XII gross intact no appreciable focal deficits  Data Reviewed: I have personally reviewed following labs and imaging studies  CBC: Recent Labs  Lab 06/02/20 0851  WBC 5.8  NEUTROABS 3.4  HGB 12.7*  HCT 36.8*  MCV 84.2  PLT 258   Basic Metabolic Panel: Recent Labs  Lab 06/02/20 0851  NA 138  K 4.2  CL 108  CO2 23  GLUCOSE 96  BUN 24*  CREATININE 0.98  CALCIUM 8.9  MG 2.1  PHOS 4.2   GFR: Estimated Creatinine Clearance: 83.5 mL/min (by C-G formula based on SCr of 0.98 mg/dL). Liver Function Tests: Recent Labs  Lab 06/02/20 0851  AST 19  ALT 35  ALKPHOS 67  BILITOT 0.6  PROT 6.1*  ALBUMIN 3.8   No results for input(s): LIPASE, AMYLASE in the last 168 hours. No results for input(s): AMMONIA in the last 168 hours. Coagulation Profile: No results for input(s): INR, PROTIME in the last 168 hours. Cardiac Enzymes: No results for input(s): CKTOTAL, CKMB, CKMBINDEX, TROPONINI in the last 168 hours. BNP (last 3 results) No results for input(s): PROBNP in the last  8760 hours. HbA1C: No results for input(s): HGBA1C in the last 72 hours. CBG: No results for input(s): GLUCAP in the last 168 hours. Lipid Profile: No results for input(s): CHOL, HDL, LDLCALC, TRIG, CHOLHDL, LDLDIRECT in the last 72 hours. Thyroid Function Tests: No results for input(s): TSH, T4TOTAL, FREET4, T3FREE, THYROIDAB in the last 72 hours. Anemia Panel: No results for input(s): VITAMINB12, FOLATE, FERRITIN, TIBC, IRON, RETICCTPCT in the last 72 hours. Sepsis Labs: No results for input(s): PROCALCITON, LATICACIDVEN in the last 168 hours.  No results found for  this or any previous visit (from the past 240 hour(s)).   RN Pressure Injury Documentation:     Estimated body mass index is 21.31 kg/m as calculated from the following:   Height as of this encounter: 5' 9"  (1.753 m).   Weight as of this encounter: 65.5 kg.  Malnutrition Type:      Malnutrition Characteristics:      Nutrition Interventions:      Radiology Studies: No results found.  Scheduled Meds: . cholecalciferol  1,000 Units Oral Daily  . clonazepam  0.5 mg Oral BID  . enoxaparin (LOVENOX) injection  40 mg Subcutaneous Q24H  . haloperidol  2 mg Oral BID  . lamoTRIgine  175 mg Oral BID  . levETIRAcetam  1,500 mg Oral BID  . melatonin  5 mg Oral QHS  . OLANZapine  5 mg Oral QHS  . vitamin B-6  50 mg Oral Daily  . sodium chloride flush  10 mL Intravenous Q12H  . thiamine  100 mg Oral Daily  . vitamin B-12  1,000 mcg Oral Daily   Continuous Infusions:   LOS: 30 days   Kerney Elbe, DO [facility] PAGER is on AMION  If 7PM-7AM, please contact night-coverage [email or web address]

## 2020-06-07 DIAGNOSIS — I959 Hypotension, unspecified: Secondary | ICD-10-CM

## 2020-06-07 MED ORDER — SODIUM CHLORIDE 0.9 % IV BOLUS
1000.0000 mL | Freq: Once | INTRAVENOUS | Status: AC
  Administered 2020-06-07: 1000 mL via INTRAVENOUS

## 2020-06-07 NOTE — Progress Notes (Addendum)
PROGRESS NOTE    Lance Ray  PYK:[id number] DOB: [birth year] DOA: 05/07/2020 PCP: Patient, No Pcp Per   Brief Narrative:  The patient is a disheveled 49 year old Caucasian male with a past medical history significant for but not limited to dementia, agitation, and history of seizure disorder.  He was initially admitted on 03/24/2020 under hospitalist service for generalized tonic-clonic seizures due to noncompliance with Depakote.  He was loaded at that time with IV lorazepam and Keppra and admitted to the PCU.  He had a negative MRI brain and head CT and his hospital course was complicated by psychosis and agitation and required Precedex drip for agitation.  Psychiatry was consulted ultimately he was discharged to behavioral health on 04/09/2020 and he was IVC.  Hospitalist team was reconsulted by behavioral health after sitter heard the patient screaming witnessed a generalized clonic seizure that lasted about 2 minutes.  Subsequently given 2 mg of IV Ativan and the seizure subsided.  Neurology was also consulted at that time.  Patient was transferred back to the hospital service and currently does not have the capacity make decisions for himself as deemed by psychiatry.  Currently the patient is awaiting legal guardianship prior to discharge and this cannot be done until next month as he is a legal guardianship hearing in October.  Currently he remains medically stable without signs of psychosis does not have a safe discharge disposition as he cannot make decisions for himself and currently does not have a legal guardian to make decisions for him.  He will remain hospitalized until his legal guardianship hearing eventually on October 19th as he needs placement in a group home.  06/07/20 Today he was a little hypotensive. Given 1 Liter of NS Bolus with improvement. Will Continue to Monitor BP per protocol   Assessment & Plan:   Principal Problem:   Seizure ([place]) Active Problems:   Psychosis  ([place])  Breakthrough seizures, likely secondary to AEDs noncompliance- stable. -AEDs adjusted by neurology -Currently on Lamotrigine 175 mg twice daily, Clonazepam 0.5 mg daily, Levitracetam 1500 mg twice daily. -Continue seizure precautions -C/w IV Lorazepam as needed for active seizures greater than 3 minutes as recommended by neurology -Neurology was consulted -Would benefit from home health RN to assist with his medications.  -TOC has been consulted to assist with home health services and guardianship.  -Need for guardianship is barrier to discharge, hearing pending next Month on October 19th 2021.  Vitamin D Deficiency -Last Vitamin D Level noted to be 17.26 -Started on Cholecalciferol 1000 IU daily.  Recheck vitamin D level in about 3 months.  Seizure Disorder -Management per above -Checked Lamictal and Keppra levels and last Check on 8/29 showed a Lamotrigine Level of 5.6 and a Levetiracetam Level of 64.6 -Cont current regimen of keppra and lamictal.  Leukocytosis with Eosinophilia  -Not present on admission, mild.  Resolved.   -Unclear etiology and ? Reactive.  No clinical suspicion or evidence of infection.   -New on 8/25, was normal previously, WBC 11.4>>11.3>>12.2>>12.6 >>9.3; Now WBC is normalized and 5.8 on last check (06/02/20) -Differential includes allergic reaction, infections (especially parasitic), drug reaction, autoimmune conditions (sarcoidosis, rheumatoid arthritis, inflammatory bowel disease, IgG4 disease), inflammatory condition or some malignancies can also cause.   -He is without respiratory symptoms, skin findings, organomegaly or lymphadenopathy on exam. IgG4 is within normal limits. -CXR on 8/27 showed low lung volumes, with right basilar atelectasis and bandlike opacity in the left midlung, atelectasis versus scarring.  Mediastinal contours were normal  without hilar lymphadenopathy. -Blood Cx showed NGTD at 5 Days & ACE level was 25 -ESR was  11 -Repeat CBC in the AM   Agitation suspect related to alcohol withdrawal-resolved. -Admitted from behavioral health -No evidence of alcohol withdrawal at the time of this visit. -Continue psych medications with Olanzapine 5 mg po qHS and Haldol 2 mg po BID, multivitamins, thiamine and folic acid.  -Seen by psych no need for inpatient psych  Normocytic Anemia -Chronic and appears stable -Hgb/Hct is now 12.7/36.8 on last check 06/02/20 -Continue to Monitor for S/Sx of Bleeding; Currently no overt bleeding noted -Repeat CBC intermittently and will repeat in the AM   Hypotension -Mild with Soft Blood pressures -Given 1 Liter NS bolus -BP Trended down to 84/52; Currently asymptomatic -Continue to Monitor BP's per Protocol   DVT prophylaxis: Enoxaparin 40 mg sq q24h Code Status: FULL CODE Family Communication:  No family present at bedside  Disposition Plan: Pending Guardianship hearing  Status is: Inpatient  Remains inpatient appropriate because:Unsafe d/c plan   Dispo:  Patient From:  Home  Planned Disposition:  Group Home  Expected discharge date:  07/14/20  Medically stable for discharge:  YES   Consultants:   Neurology  Psychiatry    Procedures: None  Antimicrobials: Anti-infectives (From admission, onward)   None     Subjective: Seen and examined at bedside and was Hypotensive this AM. No complaints and no Nausea or vomiting. No CP, SOB. Denies any lightheadedness or dizziness.   Objective: Vitals:   06/06/20 2337 06/07/20 0332 06/07/20 0744 06/07/20 0954  BP: 100/62 98/61 (!) 84/52 94/63  Pulse: 86 71 73 64  Resp: 18 17 16 16   Temp: 98.4 F (36.9 C) (!) 97.4 F (36.3 C) 97.6 F (36.4 C)   TempSrc: Oral Oral Oral   SpO2: 98% 98% 97% 98%  Weight:      Height:        Intake/Output Summary (Last 24 hours) at 06/07/2020 1038 Last data filed at 06/07/2020 2025 Gross per 24 hour  Intake 240 ml  Output --  Net 240 ml   Filed Weights   05/29/20  0500 05/30/20 0450 05/31/20 0452  Weight: 66.8 kg 64.3 kg 65.5 kg   Examination:  Physical Exam:  Constitutional: Patient is a Thin chronically ill appearing disheveled Caucasian male in NAD who appears calm and comfortable  Respiratory: Diminished to Auscultation. No appreciable Wheezing, Rales, Rhonchi; Unlabored breathing  Cardiovascular: RRR; no appreciable m/r/g Abdomen: Soft, NT, ND. Bowel sounds present GU: Deferred Neurologic: CN 2-12 grossly intact   Data Reviewed: I have personally reviewed following labs and imaging studies  CBC: Recent Labs  Lab 06/02/20 0851  WBC 5.8  NEUTROABS 3.4  HGB 12.7*  HCT 36.8*  MCV 84.2  PLT 427   Basic Metabolic Panel: Recent Labs  Lab 06/02/20 0851  NA 138  K 4.2  CL 108  CO2 23  GLUCOSE 96  BUN 24*  CREATININE 0.98  CALCIUM 8.9  MG 2.1  PHOS 4.2   GFR: Estimated Creatinine Clearance: 83.5 mL/min (by C-G formula based on SCr of 0.98 mg/dL). Liver Function Tests: Recent Labs  Lab 06/02/20 0851  AST 19  ALT 35  ALKPHOS 67  BILITOT 0.6  PROT 6.1*  ALBUMIN 3.8   No results for input(s): LIPASE, AMYLASE in the last 168 hours. No results for input(s): AMMONIA in the last 168 hours. Coagulation Profile: No results for input(s): INR, PROTIME in the last 168 hours. Cardiac Enzymes:  No results for input(s): CKTOTAL, CKMB, CKMBINDEX, TROPONINI in the last 168 hours. BNP (last 3 results) No results for input(s): PROBNP in the last 8760 hours. HbA1C: No results for input(s): HGBA1C in the last 72 hours. CBG: No results for input(s): GLUCAP in the last 168 hours. Lipid Profile: No results for input(s): CHOL, HDL, LDLCALC, TRIG, CHOLHDL, LDLDIRECT in the last 72 hours. Thyroid Function Tests: No results for input(s): TSH, T4TOTAL, FREET4, T3FREE, THYROIDAB in the last 72 hours. Anemia Panel: No results for input(s): VITAMINB12, FOLATE, FERRITIN, TIBC, IRON, RETICCTPCT in the last 72 hours. Sepsis Labs: No results for  input(s): PROCALCITON, LATICACIDVEN in the last 168 hours.  No results found for this or any previous visit (from the past 240 hour(s)).   RN Pressure Injury Documentation:     Estimated body mass index is 21.31 kg/m as calculated from the following:   Height as of this encounter: 5' 9"  (1.753 m).   Weight as of this encounter: 65.5 kg.  Malnutrition Type:      Malnutrition Characteristics:      Nutrition Interventions:      Radiology Studies: No results found.  Scheduled Meds: . cholecalciferol  1,000 Units Oral Daily  . clonazepam  0.5 mg Oral BID  . enoxaparin (LOVENOX) injection  40 mg Subcutaneous Q24H  . haloperidol  2 mg Oral BID  . lamoTRIgine  175 mg Oral BID  . levETIRAcetam  1,500 mg Oral BID  . melatonin  5 mg Oral QHS  . OLANZapine  5 mg Oral QHS  . vitamin B-6  50 mg Oral Daily  . sodium chloride flush  10 mL Intravenous Q12H  . thiamine  100 mg Oral Daily  . vitamin B-12  1,000 mcg Oral Daily   Continuous Infusions:   LOS: 31 days   Kerney Elbe, DO [facility] PAGER is on AMION  If 7PM-7AM, please contact night-coverage [email or web address]

## 2020-06-08 NOTE — TOC Progression Note (Signed)
Transition of Care [facility]) - Progression Note    Patient Details  Name: Lance Ray MRN: [id number] Date of Birth: Feb 24, 1971  Transition of Care [facility]) CM/SW Contact  Trenton Founds, RN Phone Number: 06/08/2020, 2:26 PM  Clinical Narrative:   Clinical information provided to [employer]/ Luther Redo per request as she is working on [employer] application for patient.          Expected Discharge Plan and Services                                                 Social Determinants of Health (SDOH) Interventions    Readmission Risk Interventions Readmission Risk Prevention Plan 03/29/2020  Transportation Screening Complete  HRI or Home Care Consult Complete  Social Work Consult for Recovery Care Planning/Counseling Complete  Palliative Care Screening Not Applicable  Medication Review [employer]) Referral to Pharmacy  Some recent data might be hidden

## 2020-06-08 NOTE — Progress Notes (Signed)
PROGRESS NOTE    Lance Ray  ERD:[id number] DOB: [birth year] DOA: 05/07/2020 PCP: Patient, No Pcp Per   Brief Narrative:  The patient is a disheveled 49 year old Caucasian male with a past medical history significant for but not limited to dementia, agitation, and history of seizure disorder.  He was initially admitted on 03/24/2020 under hospitalist service for generalized tonic-clonic seizures due to noncompliance with Depakote.  He was loaded at that time with IV lorazepam and Keppra and admitted to the PCU.  He had a negative MRI brain and head CT and his hospital course was complicated by psychosis and agitation and required Precedex drip for agitation.  Psychiatry was consulted ultimately he was discharged to behavioral health on 04/09/2020 and he was IVC.  Hospitalist team was reconsulted by behavioral health after sitter heard the patient screaming witnessed a generalized clonic seizure that lasted about 2 minutes.  Subsequently given 2 mg of IV Ativan and the seizure subsided.  Neurology was also consulted at that time.  Patient was transferred back to the hospital service and currently does not have the capacity make decisions for himself as deemed by psychiatry.  Currently the patient is awaiting legal guardianship prior to discharge and this cannot be done until next month as he is a legal guardianship hearing in October.  Currently he remains medically stable without signs of psychosis does not have a safe discharge disposition as he cannot make decisions for himself and currently does not have a legal guardian to make decisions for him.  He will remain hospitalized until his legal guardianship hearing eventually on October 19th as he needs placement in a group home.  06/07/20 Today he was a little hypotensive. Given 1 Liter of NS Bolus with improvement. Will Continue to Monitor BP per protocol   06/08/20 Blood Pressure is doing better today. Will check Labs in the AM   Assessment &  Plan:   Principal Problem:   Seizure ([place]) Active Problems:   Psychosis ([place])   Hypotension  Breakthrough seizures, likely secondary to AEDs noncompliance- stable. -AEDs adjusted by neurology -Currently on Lamotrigine 175 mg twice daily, Clonazepam 0.5 mg daily, Levitracetam 1500 mg twice daily. -Continue seizure precautions -C/w IV Lorazepam as needed for active seizures greater than 3 minutes as recommended by neurology -Neurology was consulted -Would benefit from home health RN to assist with his medications.  -TOC has been consulted to assist with home health services and guardianship.  -Need for guardianship is barrier to discharge, hearing pending next Month on October 19th 2021.  Vitamin D Deficiency -Last Vitamin D Level noted to be 17.26 -Started on Cholecalciferol 1000 IU daily.  Recheck vitamin D level in about 3 months.  Seizure Disorder -Management per above -Checked Lamictal and Keppra levels and last Check on 8/29 showed a Lamotrigine Level of 5.6 and a Levetiracetam Level of 64.6 -Cont current regimen of keppra and lamictal.  Leukocytosis with Eosinophilia  -Not present on admission, mild.  Resolved.   -Unclear etiology and ? Reactive.  No clinical suspicion or evidence of infection.   -New on 8/25, was normal previously, WBC 11.4>>11.3>>12.2>>12.6 >>9.3; Now WBC is normalized and 5.8 on last check (06/02/20) -Differential includes allergic reaction, infections (especially parasitic), drug reaction, autoimmune conditions (sarcoidosis, rheumatoid arthritis, inflammatory bowel disease, IgG4 disease), inflammatory condition or some malignancies can also cause.   -He is without respiratory symptoms, skin findings, organomegaly or lymphadenopathy on exam. IgG4 is within normal limits. -CXR on 8/27 showed low lung volumes, with  right basilar atelectasis and bandlike opacity in the left midlung, atelectasis versus scarring.  Mediastinal contours were normal without  hilar lymphadenopathy. -Blood Cx showed NGTD at 5 Days & ACE level was 25 -ESR was 11 -Repeat CBC in the AM   Agitation suspect related to alcohol withdrawal-resolved. -Admitted from behavioral health -No evidence of alcohol withdrawal at the time of this visit. -Continue psych medications with Olanzapine 5 mg po qHS and Haldol 2 mg po BID, multivitamins, thiamine and folic acid.  -Seen by psych no need for inpatient psych  Normocytic Anemia -Chronic and appears stable -Hgb/Hct is now 12.7/36.8 on last check 06/02/20 -Continue to Monitor for S/Sx of Bleeding; Currently no overt bleeding noted -Repeat CBC intermittently and will repeat in the AM   Hypotension -Mild with Soft Blood pressures -Given 1 Liter NS bolus on 06/07/20 -BP Trended down to 84/52 yesterday and is improved and is 96/65; Currently asymptomatic -Continue to Monitor BP's per Protocol   DVT prophylaxis: Enoxaparin 40 mg sq q24h Code Status: FULL CODE Family Communication:  No family present at bedside  Disposition Plan: Pending Guardianship hearing  Status is: Inpatient  Remains inpatient appropriate because:Unsafe d/c plan   Dispo:  Patient From:  Home  Planned Disposition:  Group Home  Expected discharge date:  07/14/20  Medically stable for discharge:  YES   Consultants:   Neurology  Psychiatry    Procedures: None  Antimicrobials: Anti-infectives (From admission, onward)   None     Subjective: Seen and examined at bedside and his BP was doing better. Had no complaints. No Nausea or Vomiting. No CP or SOB. Denies any other concerns or complaints at this time.   Objective: Vitals:   06/07/20 1207 06/07/20 1607 06/07/20 2110 06/08/20 0734  BP: (!) 92/58 (!) 94/59 111/71 96/65  Pulse: 77 84 86 73  Resp: _0 Temp: 97.7 F (36.5 C) 98.7 F (37.1 C) 99.9 F (37.7 C) 98.5 F (36.9 C)  TempSrc: Oral Oral Oral Tympanic  SpO2: 99% 96% 97% 97%  Weight:      Height:         Intake/Output Summary (Last 24 hours) at 06/08/2020 1114 Last data filed at 06/08/2020 1008 Gross per 24 hour  Intake 1860 ml  Output --  Net 1860 ml   Filed Weights   05/29/20 0500 05/30/20 0450 05/31/20 0452  Weight: 66.8 kg 64.3 kg 65.5 kg   Examination:  Physical Exam:  Constitutional: Patient is a Thin chronically ill appearing disheveled Caucasian male in NAD Respiratory: Diminished to Auscultation. No appreciable Wheezing, Rales, Rhonchi Cardiovascular: RRR Abdomen: Soft, NT, ND. Bowel sounds present GU: Deferred Neurologic: CN 2-12 grossly intact   Data Reviewed: I have personally reviewed following labs and imaging studies  CBC: Recent Labs  Lab 06/02/20 0851  WBC 5.8  NEUTROABS 3.4  HGB 12.7*  HCT 36.8*  MCV 84.2  PLT 357   Basic Metabolic Panel: Recent Labs  Lab 06/02/20 0851  NA 138  K 4.2  CL 108  CO2 23  GLUCOSE 96  BUN 24*  CREATININE 0.98  CALCIUM 8.9  MG 2.1  PHOS 4.2   GFR: Estimated Creatinine Clearance: 83.5 mL/min (by C-G formula based on SCr of 0.98 mg/dL). Liver Function Tests: Recent Labs  Lab 06/02/20 0851  AST 19  ALT 35  ALKPHOS 67  BILITOT 0.6  PROT 6.1*  ALBUMIN 3.8   No results for input(s): LIPASE, AMYLASE in the last 168 hours. No  results for input(s): AMMONIA in the last 168 hours. Coagulation Profile: No results for input(s): INR, PROTIME in the last 168 hours. Cardiac Enzymes: No results for input(s): CKTOTAL, CKMB, CKMBINDEX, TROPONINI in the last 168 hours. BNP (last 3 results) No results for input(s): PROBNP in the last 8760 hours. HbA1C: No results for input(s): HGBA1C in the last 72 hours. CBG: No results for input(s): GLUCAP in the last 168 hours. Lipid Profile: No results for input(s): CHOL, HDL, LDLCALC, TRIG, CHOLHDL, LDLDIRECT in the last 72 hours. Thyroid Function Tests: No results for input(s): TSH, T4TOTAL, FREET4, T3FREE, THYROIDAB in the last 72 hours. Anemia Panel: No results for  input(s): VITAMINB12, FOLATE, FERRITIN, TIBC, IRON, RETICCTPCT in the last 72 hours. Sepsis Labs: No results for input(s): PROCALCITON, LATICACIDVEN in the last 168 hours.  No results found for this or any previous visit (from the past 240 hour(s)).   RN Pressure Injury Documentation:     Estimated body mass index is 21.31 kg/m as calculated from the following:   Height as of this encounter: _0  (1.753 m).   Weight as of this encounter: 65.5 kg.  Malnutrition Type:      Malnutrition Characteristics:      Nutrition Interventions:      Radiology Studies: No results found.  Scheduled Meds: . cholecalciferol  1,000 Units Oral Daily  . clonazepam  0.5 mg Oral BID  . enoxaparin (LOVENOX) injection  40 mg Subcutaneous Q24H  . haloperidol  2 mg Oral BID  . lamoTRIgine  175 mg Oral BID  . levETIRAcetam  1,500 mg Oral BID  . melatonin  5 mg Oral QHS  . OLANZapine  5 mg Oral QHS  . vitamin B-6  50 mg Oral Daily  . sodium chloride flush  10 mL Intravenous Q12H  . thiamine  100 mg Oral Daily  . vitamin B-12  1,000 mcg Oral Daily   Continuous Infusions:   LOS: 32 days   Kerney Elbe, DO [facility] PAGER is on AMION  If 7PM-7AM, please contact night-coverage [email or web address]

## 2020-06-09 LAB — COMPREHENSIVE METABOLIC PANEL
ALT: 25 U/L (ref 0–44)
AST: 16 U/L (ref 15–41)
Albumin: 3.4 g/dL — ABNORMAL LOW (ref 3.5–5.0)
Alkaline Phosphatase: 65 U/L (ref 38–126)
Anion gap: 9 (ref 5–15)
BUN: 20 mg/dL (ref 6–20)
CO2: 25 mmol/L (ref 22–32)
Calcium: 8.5 mg/dL — ABNORMAL LOW (ref 8.9–10.3)
Chloride: 107 mmol/L (ref 98–111)
Creatinine, Ser: 0.98 mg/dL (ref 0.61–1.24)
GFR calc Af Amer: >60 mL/min (ref >60)
GFR calc non Af Amer: >60 mL/min (ref >60)
Glucose, Bld: 95 mg/dL (ref 70–99)
Potassium: 3.8 mmol/L (ref 3.5–5.1)
Sodium: 141 mmol/L (ref 135–145)
Total Bilirubin: 0.5 mg/dL (ref 0.3–1.2)
Total Protein: 5.8 g/dL — ABNORMAL LOW (ref 6.5–8.1)

## 2020-06-09 LAB — CBC WITH DIFFERENTIAL/PLATELET
Abs Immature Granulocytes: 0.03 10*3/uL (ref 0.00–0.07)
Basophils Absolute: 0 10*3/uL (ref 0.0–0.1)
Basophils Relative: 1 %
Eosinophils Absolute: 0.6 10*3/uL — ABNORMAL HIGH (ref 0.0–0.5)
Eosinophils Relative: 9 %
HCT: 35.2 % — ABNORMAL LOW (ref 39.0–52.0)
Hemoglobin: 11.7 g/dL — ABNORMAL LOW (ref 13.0–17.0)
Immature Granulocytes: 1 %
Lymphocytes Relative: 25 %
Lymphs Abs: 1.6 10*3/uL (ref 0.7–4.0)
MCH: 29.2 pg (ref 26.0–34.0)
MCHC: 33.2 g/dL (ref 30.0–36.0)
MCV: 87.8 fL (ref 80.0–100.0)
Monocytes Absolute: 0.6 10*3/uL (ref 0.1–1.0)
Monocytes Relative: 9 %
Neutro Abs: 3.5 10*3/uL (ref 1.7–7.7)
Neutrophils Relative %: 55 %
Platelets: 231 10*3/uL (ref 150–400)
RBC: 4.01 MIL/uL — ABNORMAL LOW (ref 4.22–5.81)
RDW: 13.5 % (ref 11.5–15.5)
WBC: 6.3 10*3/uL (ref 4.0–10.5)
nRBC: 0 % (ref 0.0–0.2)

## 2020-06-09 LAB — PHOSPHORUS: Phosphorus: 4.2 mg/dL (ref 2.5–4.6)

## 2020-06-09 LAB — MAGNESIUM: Magnesium: 1.8 mg/dL (ref 1.7–2.4)

## 2020-06-09 NOTE — Progress Notes (Signed)
Lance Ray  URK:[id number] DOB: 1971-02-07 DOA: 05/07/2020 PCP: Patient, No Pcp Per   Brief Narrative:  The patient is a disheveled 49 year old Caucasian male with a past medical history significant for but not limited to dementia, agitation, and history of seizure disorder.  He was initially admitted on 03/24/2020 under hospitalist service for generalized tonic-clonic seizures due to noncompliance with Depakote.  He was loaded at that time with IV lorazepam and Keppra and admitted to the PCU.  He had a negative MRI brain and head CT and his hospital course was complicated by psychosis and agitation and required Precedex drip for agitation.  Psychiatry was consulted ultimately he was discharged to behavioral health on 04/09/2020 and he was IVC.  Hospitalist team was reconsulted by behavioral health after sitter heard the patient screaming witnessed a generalized clonic seizure that lasted about 2 minutes.  Subsequently given 2 mg of IV Ativan and the seizure subsided.  Neurology was also consulted at that time.  Patient was transferred back to the hospital service and currently does not have the capacity make decisions for himself as deemed by psychiatry.  Currently the patient is awaiting legal guardianship prior to discharge and this cannot be done until next month as he is a legal guardianship hearing in October.  Currently he remains medically stable without signs of psychosis does not have a safe discharge disposition as he cannot make decisions for himself and currently does not have a legal guardian to make decisions for him.  He will remain hospitalized until his legal guardianship hearing eventually on October 19th as he needs placement in a group home.  06/07/20 Today he was a little hypotensive. Given 1 Liter of NS Bolus with improvement. Will Continue to Monitor BP per protocol   06/08/20 Blood Pressure is doing better today. Will check Labs in the AM   06/09/2020  -remained stable no changes, BP stable   Subjective: The patient was seen and examined this morning, hemodynamically stable, blood pressure improved, 103/72 this morning    Assessment & Plan:   Principal Problem:   Seizure ([place]) Active Problems:   Psychosis ([place])   Hypotension  Breakthrough seizures, likely secondary to AEDs noncompliance- stable. -AEDs adjusted by neurology -Currently on Lamotrigine 175 mg twice daily, Clonazepam 0.5 mg daily, Levitracetam 1500 mg twice daily. -Continue seizure precautions -C/w IV Lorazepam as needed for active seizures greater than 3 minutes as recommended by neurology -Neurology was following  -TOC has been consulted to assist with home health services and guardianship.  -Need for guardianship is barrier to discharge, hearing pending next Month on October 19th 2021.  Vitamin D Deficiency -Last Vitamin D Level noted to be 17.26 -Started on Cholecalciferol 1000 IU daily.   Recheck vitamin D level in about 3 months.  Seizure Disorder -Stable no further episodes -Management per above -Checked Lamictal and Keppra levels and last Check on 8/29 showed a Lamotrigine Level of 5.6 and a Levetiracetam Level of 64.6 -Cont current regimen of keppra and lamictal.  Leukocytosis with Eosinophilia  -Resolved  -Unclear etiology and ? Reactive.  No clinical suspicion or evidence of infection.   -New on 8/25, was normal previously, WBC 11.4>>11.3>>12.2>>12.6 >>9.3; Now WBC is normalized and 5.8 on last check (06/02/20) -Differential includes allergic reaction, infections (especially parasitic), drug reaction, autoimmune conditions (sarcoidosis, rheumatoid arthritis, inflammatory bowel disease, IgG4 disease), inflammatory condition or some malignancies can also cause.   -He is without respiratory symptoms, skin findings, organomegaly or  lymphadenopathy on exam. IgG4 is within normal limits. -CXR on 8/27 showed low lung volumes, with right basilar  atelectasis and bandlike opacity in the left midlung, atelectasis versus scarring.  Mediastinal contours were normal without hilar lymphadenopathy. -Blood Cx showed NGTD at 5 Days & ACE level was 25 -ESR was 11 -Repeat CBC in the AM   Agitation suspect related to alcohol withdrawal-resolved. -Admitted from behavioral health -Resolved -Seen by psych no need for inpatient psych  Normocytic Anemia -Chronic and appears stable -Hgb/Hct is now 12.7/36.8 on last check 06/02/20 -Continue to Monitor for S/Sx of Bleeding; Currently no overt bleeding noted -Repeat CBC intermittently and will repeat in the AM   Hypotension -Mild with Soft Blood pressures--- remained stable, improved -Given 1 Liter NS bolus on 06/07/20 -BP Trended down to 84/52 yesterday and is improved and is 96/65; Currently asymptomatic -Continue to Monitor BP's per Protocol   DVT prophylaxis: Enoxaparin 40 mg sq q24h Code Status: FULL CODE Family Communication:  No family present at bedside  Disposition Plan: Pending Guardianship hearing  Status is: Inpatient  Remains inpatient appropriate because:Unsafe d/c plan   Dispo:  Patient From:  Home  Planned Disposition:  Group Home  Expected discharge date:  07/14/20  Medically stable for discharge:  YES   Consultants:   Neurology  Psychiatry    Procedures: None  Antimicrobials: Anti-infectives (From admission, onward)   None     Objective: Vitals:   06/08/20 1127 06/08/20 1538 06/08/20 2336 06/09/20 0746  BP: 96/65 (!) 98/54 (!) 100/55 103/72  Pulse: 80 88 83 65  Resp: _0 Temp: 98.7 F (37.1 C) 98.3 F (36.8 C) 97.7 F (36.5 C) 98.6 F (37 C)  TempSrc: Oral Oral Oral   SpO2: 97% 96% 97% 99%  Weight:      Height:        Intake/Output Summary (Last 24 hours) at 06/09/2020 1328 Last data filed at 06/08/2020 1405 Gross per 24 hour  Intake 480 ml  Output --  Net 480 ml   Filed Weights   05/29/20 0500 05/30/20 0450 05/31/20 0452   Weight: 66.8 kg 64.3 kg 65.5 kg      Physical Exam:   General:  Alert, oriented, cooperative, no distress;   HEENT:  Normocephalic, PERRL, otherwise with in Normal limits   Neuro:  CNII-XII intact. , normal motor and sensation, reflexes intact   Lungs:   Clear to auscultation BL, Respirations unlabored, no wheezes / crackles  Cardio:    S1/S2, RRR, No murmure, No Rubs or Gallops   Abdomen:   Soft, non-tender, bowel sounds active all four quadrants,  no guarding or peritoneal signs.  Muscular skeletal:  Limited exam - in bed, able to move all 4 extremities, Normal strength,  2+ pulses,  symmetric, No pitting edema  Skin:  Dry, warm to touch, negative for any Rashes, No open wounds  Wounds: Please see nursing documentation           Data Reviewed: I have personally reviewed following labs and imaging studies  CBC: Recent Labs  Lab 06/09/20 0452  WBC 6.3  NEUTROABS 3.5  HGB 11.7*  HCT 35.2*  MCV 87.8  PLT 007   Basic Metabolic Panel: Recent Labs  Lab 06/09/20 0452  NA 141  K 3.8  CL 107  CO2 25  GLUCOSE 95  BUN 20  CREATININE 0.98  CALCIUM 8.5*  MG 1.8  PHOS 4.2   GFR: Estimated Creatinine Clearance: 83.5 mL/min (by  C-G formula based on SCr of 0.98 mg/dL). Liver Function Tests: No results found for this or any previous visit (from the past 240 hour(s)).   RN Pressure Injury Documentation:       Radiology Studies: No results found.  Scheduled Meds: . cholecalciferol  1,000 Units Oral Daily  . clonazepam  0.5 mg Oral BID  . enoxaparin (LOVENOX) injection  40 mg Subcutaneous Q24H  . haloperidol  2 mg Oral BID  . lamoTRIgine  175 mg Oral BID  . levETIRAcetam  1,500 mg Oral BID  . melatonin  5 mg Oral QHS  . OLANZapine  5 mg Oral QHS  . vitamin B-6  50 mg Oral Daily  . sodium chloride flush  10 mL Intravenous Q12H  . thiamine  100 mg Oral Daily  . vitamin B-12  1,000 mcg Oral Daily   Continuous Infusions:   LOS: 33 days   Deatra James,  MD [facility] PAGER is on AMION  If 7PM-7AM, please contact night-coverage [email or web address]

## 2020-06-10 NOTE — Progress Notes (Signed)
PROGRESS NOTE    Lance Ray  UTM:[id number] DOB: 01/11/1971 DOA: 05/07/2020 PCP: Patient, No Pcp Per   Brief Narrative:  The patient is a disheveled 49 year old Caucasian male with a past medical history significant for but not limited to dementia, agitation, and history of seizure disorder.  He was initially admitted on 03/24/2020 under hospitalist service for generalized tonic-clonic seizures due to noncompliance with Depakote.  He was loaded at that time with IV lorazepam and Keppra and admitted to the PCU.  He had a negative MRI brain and head CT and his hospital course was complicated by psychosis and agitation and required Precedex drip for agitation.  Psychiatry was consulted ultimately he was discharged to behavioral health on 04/09/2020 and he was IVC.  Hospitalist team was reconsulted by behavioral health after sitter heard the patient screaming witnessed a generalized clonic seizure that lasted about 2 minutes.  Subsequently given 2 mg of IV Ativan and the seizure subsided.  Neurology was also consulted at that time.  Patient was transferred back to the hospital service and currently does not have the capacity make decisions for himself as deemed by psychiatry.  Currently the patient is awaiting legal guardianship prior to discharge and this cannot be done until next month as he is a legal guardianship hearing in October.  Currently he remains medically stable without signs of psychosis does not have a safe discharge disposition as he cannot make decisions for himself and currently does not have a legal guardian to make decisions for him.  He will remain hospitalized until his legal guardianship hearing eventually on October 19th as he needs placement in a group home.  06/07/20 Today he was a little hypotensive. Given 1 Liter of NS Bolus with improvement. Will Continue to Monitor BP per protocol   06/08/20 Blood Pressure is doing better today. Will check Labs in the AM   06/09/2020  -remained stable no changes, BP stable  06/10/2020-remained stable, no changes  Subjective: The patient was seen and examined, remained stable no changes, blood pressure remained stable at 103/67  No issues overnight    Assessment & Plan:   Principal Problem:   Seizure ([place]) Active Problems:   Psychosis ([place])   Hypotension  Patient remained stable, no changes ...   Breakthrough seizures, likely secondary to AEDs noncompliance- stable. -AEDs adjusted by neurology -Currently on Lamotrigine 175 mg twice daily, Clonazepam 0.5 mg daily, Levitracetam 1500 mg twice daily. -Continue seizure precautions -C/w IV Lorazepam as needed for active seizures greater than 3 minutes as recommended by neurology -Neurology was following  -TOC has been consulted to assist with home health services and guardianship.  -Need for guardianship is barrier to discharge, hearing pending next Month on October 19th 2021.  Vitamin D Deficiency -Last Vitamin D Level noted to be 17.26 -Started on Cholecalciferol 1000 IU daily.   Recheck vitamin D level in about 3 months.  Seizure Disorder -Stable no further episodes -Management per above -Checked Lamictal and Keppra levels and last Check on 8/29 showed a Lamotrigine Level of 5.6 and a Levetiracetam Level of 64.6 -Cont current regimen of keppra and lamictal.  Leukocytosis with Eosinophilia  -Improved, remained stable -Unclear etiology and ? Reactive.  No clinical suspicion or evidence of infection.   -New on 8/25, was normal previously, WBC 11.4>>11.3>>12.2>>12.6 >>9.3; Now WBC is normalized and 5.8 on last check (06/02/20) -Differential includes allergic reaction, infections (especially parasitic), drug reaction, autoimmune conditions (sarcoidosis, rheumatoid arthritis, inflammatory bowel disease, IgG4 disease), inflammatory condition  or some malignancies can also cause.    IgG4 is within normal limits. -CXR on 8/27 showed low lung volumes, with  right basilar atelectasis and bandlike opacity in the left midlung, atelectasis versus scarring.  Mediastinal contours were normal without hilar lymphadenopathy. -Blood Cx showed NGTD at 5 Days & ACE level was 25 -ESR was 11    Agitation suspect related to alcohol withdrawal-resolved. -Admitted from behavioral health -Resolved -Seen by psych no need for inpatient psych  Normocytic Anemia -Chronic and appears stable -Hgb/Hct is now 12.7/36.8 on last check 06/02/20 -Continue to Monitor for S/Sx of Bleeding; Currently no overt bleeding noted -Repeat CBC intermittently and will repeat in the AM   Hypotension -Mild with Soft Blood pressures--- remained stable, improved -Given 1 Liter NS bolus on 06/07/20 -BP Trended down to 84/52 yesterday and is improved and is 96/65; Currently asymptomatic -Continue to Monitor BP's per Protocol   DVT prophylaxis: Enoxaparin 40 mg sq q24h Code Status: FULL CODE Family Communication:  No family present at bedside  Disposition Plan: Pending Guardianship hearing  Status is: Inpatient  Remains inpatient appropriate because:Unsafe d/c plan   Dispo:  Patient From:  Home  Planned Disposition:  Group Home  Expected discharge date:  07/14/20  Medically stable for discharge:  YES   Consultants:   Neurology  Psychiatry    Procedures: None  Antimicrobials: Anti-infectives (From admission, onward)   None     Objective: Vitals:   06/09/20 2359 06/10/20 0415 06/10/20 0742 06/10/20 1158  BP: 104/70 108/74 103/66 103/67  Pulse: 81 77 67 77  Resp: _0 Temp: 98.2 F (36.8 C) 97.8 F (36.6 C) 97.9 F (36.6 C) 98.7 F (37.1 C)  TempSrc: Oral Oral Oral Oral  SpO2: 97% 97% 97% 98%  Weight:      Height:        Intake/Output Summary (Last 24 hours) at 06/10/2020 1305 Last data filed at 06/10/2020 0900 Gross per 24 hour  Intake 240 ml  Output --  Net 240 ml   Filed Weights   05/29/20 0500 05/30/20 0450 05/31/20 0452  Weight:  66.8 kg 64.3 kg 65.5 kg        Physical Exam:  No changes General:  Alert, oriented, cooperative, no distress;   HEENT:  Normocephalic, PERRL, otherwise with in Normal limits   Neuro:  CNII-XII intact. , normal motor and sensation, reflexes intact   Lungs:   Clear to auscultation BL, Respirations unlabored, no wheezes / crackles  Cardio:    S1/S2, RRR, No murmure, No Rubs or Gallops   Abdomen:   Soft, non-tender, bowel sounds active all four quadrants,  no guarding or peritoneal signs.  Muscular skeletal:  Limited exam - in bed, able to move all 4 extremities, Normal strength,  2+ pulses,  symmetric, No pitting edema  Skin:  Dry, warm to touch, negative for any Rashes, No open wounds  Wounds: Please see nursing documentation              Data Reviewed: I have personally reviewed following labs and imaging studies  CBC: Recent Labs  Lab 06/09/20 0452  WBC 6.3  NEUTROABS 3.5  HGB 11.7*  HCT 35.2*  MCV 87.8  PLT 478   Basic Metabolic Panel: Recent Labs  Lab 06/09/20 0452  NA 141  K 3.8  CL 107  CO2 25  GLUCOSE 95  BUN 20  CREATININE 0.98  CALCIUM 8.5*  MG 1.8  PHOS 4.2   GFR:  Estimated Creatinine Clearance: 83.5 mL/min (by C-G formula based on SCr of 0.98 mg/dL). Liver Function Tests: No results found for this or any previous visit (from the past 240 hour(s)).   RN Pressure Injury Documentation:       Radiology Studies: No results found.  Scheduled Meds: . cholecalciferol  1,000 Units Oral Daily  . clonazepam  0.5 mg Oral BID  . enoxaparin (LOVENOX) injection  40 mg Subcutaneous Q24H  . haloperidol  2 mg Oral BID  . lamoTRIgine  175 mg Oral BID  . levETIRAcetam  1,500 mg Oral BID  . melatonin  5 mg Oral QHS  . OLANZapine  5 mg Oral QHS  . vitamin B-6  50 mg Oral Daily  . sodium chloride flush  10 mL Intravenous Q12H  . thiamine  100 mg Oral Daily  . vitamin B-12  1,000 mcg Oral Daily   Continuous Infusions:   LOS: 34 days   Deatra James, MD [facility] PAGER is on AMION  If 7PM-7AM, please contact night-coverage [email or web address]

## 2020-06-11 NOTE — Progress Notes (Signed)
PROGRESS NOTE    Lance Ray  JKD:[id number] DOB: 1970-10-04 DOA: 05/07/2020 PCP: Patient, No Pcp Per   Brief Narrative:  The patient is a disheveled 49 year old Caucasian male with a past medical history significant for but not limited to dementia, agitation, and history of seizure disorder.  He was initially admitted on 03/24/2020 under hospitalist service for generalized tonic-clonic seizures due to noncompliance with Depakote.  He was loaded at that time with IV lorazepam and Keppra and admitted to the PCU.  He had a negative MRI brain and head CT and his hospital course was complicated by psychosis and agitation and required Precedex drip for agitation.  Psychiatry was consulted ultimately he was discharged to behavioral health on 04/09/2020 and he was IVC.  Hospitalist team was reconsulted by behavioral health after sitter heard the patient screaming witnessed a generalized clonic seizure that lasted about 2 minutes.  Subsequently given 2 mg of IV Ativan and the seizure subsided.  Neurology was also consulted at that time.  Patient was transferred back to the hospital service and currently does not have the capacity make decisions for himself as deemed by psychiatry.  Currently the patient is awaiting legal guardianship prior to discharge and this cannot be done until next month as he is a legal guardianship hearing in October.  Currently he remains medically stable without signs of psychosis does not have a safe discharge disposition as he cannot make decisions for himself and currently does not have a legal guardian to make decisions for him.  He will remain hospitalized until his legal guardianship hearing eventually on October 19th as he needs placement in a group home.  06/07/20 Today he was a little hypotensive. Given 1 Liter of NS Bolus with improvement. Will Continue to Monitor BP per protocol   06/08/20 Blood Pressure is doing better today. Will check Labs in the AM   06/09/2020  -remained stable no changes, BP stable  9/16/  -  9/17 -remained stable, no changes     Subjective:  The patient seen and examined, stable No issues overnight    Assessment & Plan:   Principal Problem:   Seizure ([place]) Active Problems:   Psychosis ([place])   Hypotension  Remained stable no changes  Breakthrough seizures, likely secondary to AEDs noncompliance- stable. -AEDs adjusted by neurology -Currently on Lamotrigine 175 mg twice daily, Clonazepam 0.5 mg daily, Levitracetam 1500 mg twice daily. -Continue seizure precautions -C/w IV Lorazepam as needed for active seizures greater than 3 minutes as recommended by neurology -Neurology was following  -TOC has been consulted to assist with home health services and guardianship.  -Need for guardianship is barrier to discharge, hearing pending next Month on October 19th 2021.  Vitamin D Deficiency -Last Vitamin D Level noted to be 17.26 -Started on Cholecalciferol 1000 IU daily.   Recheck vitamin D level in about 3 months.  Seizure Disorder -Stable no further episodes -Checked Lamictal and Keppra levels and last Check on 8/29 showed a Lamotrigine Level of 5.6 and a Levetiracetam Level of 64.6 -Cont current regimen of keppra and lamictal.  Leukocytosis with Eosinophilia  -Improved, remained stable -Unclear etiology and ? Reactive.  No clinical suspicion or evidence of infection.   -Resolved      IgG4 is within normal limits. -CXR on 8/27 showed low lung volumes, with right basilar atelectasis and bandlike opacity in the left midlung, atelectasis versus scarring.  Mediastinal contours were normal without hilar lymphadenopathy. -Blood Cx showed NGTD at 5 Days &  ACE level was 25 -ESR was 11    Agitation suspect related to alcohol withdrawal-resolved. -Admitted from behavioral health -Resolved -Seen by psych no need for inpatient psych  Normocytic Anemia -Chronic and appears stable -Hgb/Hct is now  12.7/36.8 on last check 06/02/20   Hypotension -Mild with Soft Blood pressures--- remained stable, improved -Given 1 Liter NS bolus on 06/07/20 -Continue to Monitor BP's per Protocol     DVT prophylaxis: Enoxaparin 40 mg sq q24h Code Status: FULL CODE Family Communication:  No family present at bedside  Disposition Plan: Pending Guardianship hearing  Status is: Inpatient  Remains inpatient appropriate because:Unsafe d/c plan   Dispo:  Patient From:  Home  Planned Disposition:  Group Home  Expected discharge date:  07/14/20 >>> ?? SNF  Medically stable for discharge:  YES   Consultants:   Neurology  Psychiatry    Procedures: None  Antimicrobials: Anti-infectives (From admission, onward)   None     Objective: Vitals:   06/10/20 1624 06/11/20 0044 06/11/20 0750 06/11/20 1157  BP: 102/60 108/75 93/65 103/67  Pulse: 83 83 67 69  Resp: 16 16 18 16   Temp: 99.1 F (37.3 C) 98.6 F (37 C) 97.9 F (36.6 C) (!) 97.4 F (36.3 C)  TempSrc: Oral Oral Oral Oral  SpO2: 97% 96% 98% 99%  Weight:      Height:        Intake/Output Summary (Last 24 hours) at 06/11/2020 1228 Last data filed at 06/10/2020 1900 Gross per 24 hour  Intake 240 ml  Output --  Net 240 ml   Filed Weights   05/29/20 0500 05/30/20 0450 05/31/20 0452  Weight: 66.8 kg 64.3 kg 65.5 kg         Physical Exam:  No changes General:  Alert, oriented, cooperative, no distress;   HEENT:  Normocephalic, PERRL, otherwise with in Normal limits   Neuro:  CNII-XII intact. , normal motor and sensation, reflexes intact   Lungs:   Clear to auscultation BL, Respirations unlabored, no wheezes / crackles  Cardio:    S1/S2, RRR, No murmure, No Rubs or Gallops   Abdomen:   Soft, non-tender, bowel sounds active all four quadrants,  no guarding or peritoneal signs.  Muscular skeletal:  Limited exam - in bed, able to move all 4 extremities, Normal strength,  2+ pulses,  symmetric, No pitting edema  Skin:  Dry,  warm to touch, negative for any Rashes, No open wounds  Wounds: Please see nursing documentation                  Data Reviewed: I have personally reviewed following labs and imaging studies  CBC: Recent Labs  Lab 06/09/20 0452  WBC 6.3  NEUTROABS 3.5  HGB 11.7*  HCT 35.2*  MCV 87.8  PLT 127   Basic Metabolic Panel: Recent Labs  Lab 06/09/20 0452  NA 141  K 3.8  CL 107  CO2 25  GLUCOSE 95  BUN 20  CREATININE 0.98  CALCIUM 8.5*  MG 1.8  PHOS 4.2   GFR: Estimated Creatinine Clearance: 83.5 mL/min (by C-G formula based on SCr of 0.98 mg/dL). Liver Function Tests: No results found for this or any previous visit (from the past 240 hour(s)).   RN Pressure Injury Documentation:       Radiology Studies: No results found.  Scheduled Meds: . cholecalciferol  1,000 Units Oral Daily  . clonazepam  0.5 mg Oral BID  . enoxaparin (LOVENOX) injection  40 mg Subcutaneous  Q24H  . haloperidol  2 mg Oral BID  . lamoTRIgine  175 mg Oral BID  . levETIRAcetam  1,500 mg Oral BID  . melatonin  5 mg Oral QHS  . OLANZapine  5 mg Oral QHS  . vitamin B-6  50 mg Oral Daily  . sodium chloride flush  10 mL Intravenous Q12H  . thiamine  100 mg Oral Daily  . vitamin B-12  1,000 mcg Oral Daily   Continuous Infusions:   LOS: 35 days   Deatra James, MD [facility] PAGER is on AMION  If 7PM-7AM, please contact night-coverage [email or web address]

## 2020-06-12 NOTE — Progress Notes (Signed)
PROGRESS NOTE    Lance SHALL  Ray:[id number] DOB: May 02, 1971 DOA: 05/07/2020 PCP: Patient, No Pcp Per   Brief Narrative:  The patient is a disheveled 49 year old Caucasian male with a past medical history significant for but not limited to dementia, agitation, and history of seizure disorder.  He was initially admitted on 03/24/2020 under hospitalist service for generalized tonic-clonic seizures due to noncompliance with Depakote.  He was loaded at that time with IV lorazepam and Keppra and admitted to the PCU.  He had a negative MRI brain and head CT and his hospital course was complicated by psychosis and agitation and required Precedex drip for agitation.  Psychiatry was consulted ultimately he was discharged to behavioral health on 04/09/2020 and he was IVC.  Hospitalist team was reconsulted by behavioral health after sitter heard the patient screaming witnessed a generalized clonic seizure that lasted about 2 minutes.  Subsequently given 2 mg of IV Ativan and the seizure subsided.  Neurology was also consulted at that time.  Patient was transferred back to the hospital service and currently does not have the capacity make decisions for himself as deemed by psychiatry.  Currently the patient is awaiting legal guardianship prior to discharge and this cannot be done until next month as he is a legal guardianship hearing in October.  Currently he remains medically stable without signs of psychosis does not have a safe discharge disposition as he cannot make decisions for himself and currently does not have a legal guardian to make decisions for him.  He will remain hospitalized until his legal guardianship hearing eventually on October 19th as he needs placement in a group home.  06/07/20 Today he was a little hypotensive. Given 1 Liter of NS Bolus with improvement. Will Continue to Monitor BP per protocol   06/08/20 Blood Pressure is doing better today. Will check Labs in the AM   06/09/2020  -remained stable no changes, BP stable  9/16/  -  9/17-  9/18  -remained stable, no changes     Subjective:  The patient was seen and examined, stable no acute distress no issues overnight.   Assessment & Plan:   Principal Problem:   Seizure ([place]) Active Problems:   Psychosis ([place])   Hypotension  The patient was seen and examined no changes today ... No complaints, blood pressure remained soft monitor  Breakthrough seizures, likely secondary to AEDs noncompliance- stable. -AEDs adjusted by neurology -Currently on Lamotrigine 175 mg twice daily, Clonazepam 0.5 mg daily, Levitracetam 1500 mg twice daily. -Continue seizure precautions -C/w IV Lorazepam as needed for active seizures greater than 3 minutes as recommended by neurology -Neurology was following   -TOC has been consulted to assist with home health services and guardianship.  -Need for guardianship is barrier to discharge, hearing pending next Month on October 19th 2021.  Vitamin D Deficiency -Last Vitamin D Level noted to be 17.26 -Started on Cholecalciferol 1000 IU daily.   Recheck vitamin D level in about 3 months.  Seizure Disorder -Stable monitoring no further episodes -Checked Lamictal and Keppra levels and last Check on 8/29 showed a Lamotrigine Level of 5.6 and a Levetiracetam Level of 64.6 -Cont current regimen of keppra and lamictal.  Leukocytosis with Eosinophilia  -Improved, remained stable -Unclear etiology and ? Reactive.  No clinical suspicion or evidence of infection.   -Resolved      IgG4 is within normal limits. -Last CXR on 8/27  -Blood Cx showed NGTD at 5 Days & ACE level was  25 -ESR was 11    Agitation suspect related to alcohol withdrawal-resolved. -Admitted from behavioral health -Seen by psych no need for inpatient psych -Resolved  Normocytic Anemia -Chronic and appears stable -Hgb/Hct is now 12.7/36.8 on last check 06/02/20  Hypotension -Remained soft, monitoring,  asymptomatic -Given 1 Liter NS bolus on 06/07/20 -Continue to Monitor BP's per Protocol     DVT prophylaxis: Enoxaparin 40 mg sq q24h Code Status: FULL CODE Family Communication:  No family present at bedside  Disposition Plan: Pending Guardianship hearing  Status is: Inpatient  Remains inpatient appropriate because:Unsafe d/c plan   Dispo:  Patient From:  Home  Planned Disposition:  Group Home  Expected discharge date:  07/14/20 >>> ?? SNF  Medically stable for discharge:  YES   Consultants:   Neurology  Psychiatry    Procedures: None  Antimicrobials: Anti-infectives (From admission, onward)   None     Objective: Vitals:   06/12/20 0000 06/12/20 0406 06/12/20 0724 06/12/20 1230  BP: (!) 1_0 (!) 89/58  Pulse: 88 73 64 72  Resp: _1 Temp: 98.7 F (37.1 C) 98 F (36.7 C) 97.9 F (36.6 C) 98.1 F (36.7 C)  TempSrc: Oral Oral Oral Oral  SpO2: 95% 98% 98% 98%  Weight:      Height:        Intake/Output Summary (Last 24 hours) at 06/12/2020 1248 Last data filed at 06/12/2020 1006 Gross per 24 hour  Intake 720 ml  Output 500 ml  Net 220 ml   Filed Weights   05/29/20 0500 05/30/20 0450 05/31/20 0452  Weight: 66.8 kg 64.3 kg 65.5 kg      Physical Exam:   General:  Alert, oriented, cooperative, no distress;   HEENT:  Normocephalic, PERRL, otherwise with in Normal limits   Neuro:  CNII-XII intact. , normal motor and sensation, reflexes intact   Lungs:   Clear to auscultation BL, Respirations unlabored, no wheezes / crackles  Cardio:    S1/S2, RRR, No murmure, No Rubs or Gallops   Abdomen:   Soft, non-tender, bowel sounds active all four quadrants,  no guarding or peritoneal signs.  Muscular skeletal:  Limited exam - in bed, able to move all 4 extremities, Normal strength,  2+ pulses,  symmetric, No pitting edema  Skin:  Dry, warm to touch, negative for any Rashes, No open wounds  Wounds: Please see nursing documentation         Data Reviewed: I have personally reviewed following labs and imaging studies  CBC: Recent Labs  Lab 06/09/20 0452  WBC 6.3  NEUTROABS 3.5  HGB 11.7*  HCT 35.2*  MCV 87.8  PLT 170   Basic Metabolic Panel: Recent Labs  Lab 06/09/20 0452  NA 141  K 3.8  CL 107  CO2 25  GLUCOSE 95  BUN 20  CREATININE 0.98  CALCIUM 8.5*  MG 1.8  PHOS 4.2   GFR: Estimated Creatinine Clearance: 83.5 mL/min (by C-G formula based on SCr of 0.98 mg/dL). Liver Function Tests: No results found for this or any previous visit (from the past 240 hour(s)).   RN Pressure Injury Documentation:       Radiology Studies: No results found.  Scheduled Meds: . cholecalciferol  1,000 Units Oral Daily  . clonazepam  0.5 mg Oral BID  . enoxaparin (LOVENOX) injection  40 mg Subcutaneous Q24H  . haloperidol  2 mg Oral BID  . lamoTRIgine  175 mg Oral BID  . levETIRAcetam  1,500 mg Oral BID  . melatonin  5 mg Oral QHS  . OLANZapine  5 mg Oral QHS  . vitamin B-6  50 mg Oral Daily  . sodium chloride flush  10 mL Intravenous Q12H  . thiamine  100 mg Oral Daily  . vitamin B-12  1,000 mcg Oral Daily   Continuous Infusions:   LOS: 36 days   Deatra James, MD [facility] PAGER is on AMION  If 7PM-7AM, please contact night-coverage [email or web address]

## 2020-06-12 NOTE — Progress Notes (Signed)
BP 89/78, HR 72. Dr Flossie Dibble notified, no new orders. Patient asympomatic and not in any current acute distress.

## 2020-06-13 MED ORDER — HALOPERIDOL 1 MG PO TABS
1.0000 mg | ORAL_TABLET | Freq: Two times a day (BID) | ORAL | Status: DC
  Administered 2020-06-13 – 2020-06-14 (×4): 1 mg via ORAL
  Filled 2020-06-13 (×5): qty 1

## 2020-06-13 MED ORDER — LAMOTRIGINE 25 MG PO TABS
175.0000 mg | ORAL_TABLET | Freq: Two times a day (BID) | ORAL | Status: DC
  Administered 2020-06-13 – 2020-06-21 (×16): 175 mg via ORAL
  Filled 2020-06-13 (×17): qty 3

## 2020-06-13 NOTE — Progress Notes (Signed)
PROGRESS NOTE    Lance Ray  [id number]:[id number] DOB: 05-Apr-1971 DOA: 05/07/2020 PCP: Patient, No Pcp Per   Brief Narrative:  The patient is a disheveled 49 year old Caucasian male with a past medical history significant for but not limited to dementia, agitation, and history of seizure disorder.  He was initially admitted on 03/24/2020 under hospitalist service for generalized tonic-clonic seizures due to noncompliance with Depakote.  He was loaded at that time with IV lorazepam and Keppra and admitted to the PCU.  He had a negative MRI brain and head CT and his hospital course was complicated by psychosis and agitation and required Precedex drip for agitation.  Psychiatry was consulted ultimately he was discharged to behavioral health on 04/09/2020 and he was IVC.  Hospitalist team was reconsulted by behavioral health after sitter heard the patient screaming witnessed a generalized clonic seizure that lasted about 2 minutes.  Subsequently given 2 mg of IV Ativan and the seizure subsided.  Neurology was also consulted at that time.  Patient was transferred back to the hospital service and currently does not have the capacity make decisions for himself as deemed by psychiatry.  Currently the patient is awaiting legal guardianship prior to discharge and this cannot be done until next month as he is a legal guardianship hearing in October.  Currently he remains medically stable without signs of psychosis does not have a safe discharge disposition as he cannot make decisions for himself and currently does not have a legal guardian to make decisions for him.  He will remain hospitalized until his legal guardianship hearing eventually on October 19th as he needs placement in a group home.  06/07/20 Today he was a little hypotensive. Given 1 Liter of NS Bolus with improvement. Will Continue to Monitor BP per protocol   06/08/20 Blood Pressure is doing better today. Will check Labs in the AM   06/09/2020  -remained stable no changes, BP stable  9/16/  -  9/17-  9/18 - 9/19 -blood pressure remained low, but stable no complaints     Subjective:  The patient was seen and examined this morning, stable alignment chest pain shortness of breath dizziness Blood pressure running low -but asymptomatic   Assessment & Plan:   Principal Problem:   Seizure ([place]) Active Problems:   Psychosis ([facility])   Hypotension  The patient was seen and examined no changes today ... No complaints, blood pressure remained soft monitor  Breakthrough seizures, likely secondary to AEDs noncompliance- stable. -AEDs adjusted by neurology -Currently on Lamotrigine 175 mg twice daily, Clonazepam 0.5 mg daily, Levitracetam 1500 mg twice daily. -Continue seizure precautions -C/w IV Lorazepam as needed for active seizures greater than 3 minutes as recommended by neurology -Neurology was following   -TOC has been consulted to assist with home health services and guardianship.  -Need for guardianship is barrier to discharge, hearing pending next Month on October 19th 2021.  Vitamin D Deficiency -Last Vitamin D Level noted to be 17.26 -Started on Cholecalciferol 1000 IU daily.   Recheck vitamin D level in about 3 months.  Seizure Disorder -Stable monitoring no further episodes -Checked Lamictal and Keppra levels and last Check on 8/29 showed a Lamotrigine Level of 5.6 and a Levetiracetam Level of 64.6 -Cont current regimen of keppra and lamictal.  Leukocytosis with Eosinophilia  -Improved, remained stable -Unclear etiology and ? Reactive.  No clinical suspicion or evidence of infection.   -Resolved      IgG4 is within normal limits. -Last CXR  on 8/27  -Blood Cx showed NGTD at 5 Days & ACE level was 25 -ESR was 11    Agitation suspect related to alcohol withdrawal-resolved. -Admitted from behavioral health -Seen by psych no need for inpatient psych -Resolved  Normocytic Anemia -Chronic and  appears stable -Hgb/Hct is now 12.7/36.8 on last check 06/02/20  Hypotension -Blood pressure remained soft, but remains asymptomatic, stable -Given 1 Liter NS bolus on 06/07/20 -Continue to Monitor BP's per Protocol     DVT prophylaxis: Enoxaparin 40 mg sq q24h Code Status: FULL CODE Family Communication:  No family present at bedside  Disposition Plan: Pending Guardianship hearing  Status is: Inpatient  Remains inpatient appropriate because:Unsafe d/c plan   Dispo:  Patient From:  Home  Planned Disposition:  Group Home  Expected discharge date:  07/14/20 >>> ?? SNF  Medically stable for discharge:  YES   Consultants:   Neurology  Psychiatry    Procedures: None  Antimicrobials: Anti-infectives (From admission, onward)   None     Objective: Vitals:   06/12/20 2310 06/13/20 0411 06/13/20 0821 06/13/20 1118  BP: 107/74 99/64 (!) 92/58 96/62  Pulse: 86 69 68 74  Resp: 16 15 17 18   Temp: 98.4 F (36.9 C) 98.1 F (36.7 C) 97.8 F (36.6 C) 98.4 F (36.9 C)  TempSrc: Oral Oral Oral Oral  SpO2: 95% 97% 99% 96%  Weight:      Height:        Intake/Output Summary (Last 24 hours) at 06/13/2020 1203 Last data filed at 06/13/2020 1010 Gross per 24 hour  Intake 960 ml  Output --  Net 960 ml   Filed Weights   05/29/20 0500 05/30/20 0450 05/31/20 0452  Weight: 66.8 kg 64.3 kg 65.5 kg       Physical Exam:   General:  Alert, oriented, cooperative, no distress;   HEENT:  Normocephalic, PERRL, otherwise with in Normal limits   Neuro:  CNII-XII intact. , normal motor and sensation, reflexes intact   Lungs:   Clear to auscultation BL, Respirations unlabored, no wheezes / crackles  Cardio:    S1/S2, RRR, No murmure, No Rubs or Gallops   Abdomen:   Soft, non-tender, bowel sounds active all four quadrants,  no guarding or peritoneal signs.  Muscular skeletal:  Limited exam - in bed, able to move all 4 extremities, Normal strength,  2+ pulses,  symmetric, No pitting  edema  Skin:  Dry, warm to touch, negative for any Rashes, No open wounds  Wounds: Please see nursing documentation           Data Reviewed: I have personally reviewed following labs and imaging studies  CBC: Recent Labs  Lab 06/09/20 0452  WBC 6.3  NEUTROABS 3.5  HGB 11.7*  HCT 35.2*  MCV 87.8  PLT 917   Basic Metabolic Panel: Recent Labs  Lab 06/09/20 0452  NA 141  K 3.8  CL 107  CO2 25  GLUCOSE 95  BUN 20  CREATININE 0.98  CALCIUM 8.5*  MG 1.8  PHOS 4.2   GFR: Estimated Creatinine Clearance: 83.5 mL/min (by C-G formula based on SCr of 0.98 mg/dL). Liver Function Tests: No results found for this or any previous visit (from the past 240 hour(s)).   RN Pressure Injury Documentation:       Radiology Studies: No results found.  Scheduled Meds: . cholecalciferol  1,000 Units Oral Daily  . clonazepam  0.5 mg Oral BID  . enoxaparin (LOVENOX) injection  40 mg Subcutaneous  Q24H  . haloperidol  1 mg Oral BID  . levETIRAcetam  1,500 mg Oral BID  . melatonin  5 mg Oral QHS  . OLANZapine  5 mg Oral QHS  . vitamin B-6  50 mg Oral Daily  . sodium chloride flush  10 mL Intravenous Q12H  . thiamine  100 mg Oral Daily  . vitamin B-12  1,000 mcg Oral Daily   Continuous Infusions: . lamoTRIgine (LAMICTAL) tablet 175 mg       LOS: 23 days   Deatra James, MD [facility] PAGER is on AMION  If 7PM-7AM, please contact night-coverage [email or web address]

## 2020-06-14 NOTE — Progress Notes (Signed)
PROGRESS NOTE    Lance Ray  WUJ:[id number] DOB: Apr 26, 1971 DOA: 05/07/2020 PCP: Patient, No Pcp Per   Brief Narrative:  The patient is a disheveled 49 year old Caucasian male with a past medical history significant for but not limited to dementia, agitation, and history of seizure disorder.  He was initially admitted on 03/24/2020 under hospitalist service for generalized tonic-clonic seizures due to noncompliance with Depakote.  He was loaded at that time with IV lorazepam and Keppra and admitted to the PCU.  He had a negative MRI brain and head CT and his hospital course was complicated by psychosis and agitation and required Precedex drip for agitation.  Psychiatry was consulted ultimately he was discharged to behavioral health on 04/09/2020 and he was IVC.  Hospitalist team was reconsulted by behavioral health after sitter heard the patient screaming witnessed a generalized clonic seizure that lasted about 2 minutes.  Subsequently given 2 mg of IV Ativan and the seizure subsided.  Neurology was also consulted at that time.  Patient was transferred back to the hospital service and currently does not have the capacity make decisions for himself as deemed by psychiatry.  Currently the patient is awaiting legal guardianship prior to discharge and this cannot be done until next month as he is a legal guardianship hearing in October.  Currently he remains medically stable without signs of psychosis does not have a safe discharge disposition as he cannot make decisions for himself and currently does not have a legal guardian to make decisions for him.  He will remain hospitalized until his legal guardianship hearing eventually on October 19th as he needs placement in a group home.  06/07/20 Today he was a little hypotensive. Given 1 Liter of NS Bolus with improvement. Will Continue to Monitor BP per protocol   06/08/20 Blood Pressure is doing better today. Will check Labs in the AM   06/09/2020  -remained stable no changes, BP stable  9/16/  -  9/17-  9/18 - 9/19 -06/14/2020 blood pressure remained low, but stable no complaints     Subjective:  Patient was seen and examined this morning, stable no acute distress blood pressure still remained soft with patient remained asymptomatic --blood pressure this morning 98/69    Assessment & Plan:   Principal Problem:   Seizure ([place]) Active Problems:   Psychosis ([place])   Hypotension  No changes, monitor blood pressure  Breakthrough seizures, likely secondary to AEDs noncompliance- stable. -Stable no further episodes of seizures -AEDs adjusted by neurology -Currently on Lamotrigine 175 mg twice daily, Clonazepam 0.5 mg daily, Levitracetam 1500 mg twice daily. -Continue seizure precautions -C/w IV Lorazepam as needed for active seizures greater than 3 minutes as recommended by neurology -Neurology was following   -TOC has been consulted to assist with home health services and guardianship.  -Need for guardianship is barrier to discharge, hearing pending next Month on October 19th 2021.  Vitamin D Deficiency -Last Vitamin D Level noted to be 17.26 -Started on Cholecalciferol 1000 IU daily.   Recheck vitamin D level in about 3 months.  Seizure Disorder -Stable -Checked Lamictal and Keppra levels and last Check on 8/29 showed a Lamotrigine Level of 5.6 and a Levetiracetam Level of 64.6 -Cont current regimen of keppra and lamictal.  Leukocytosis with Eosinophilia  -Resolved -Unclear etiology and ? Reactive.  No clinical suspicion or evidence of infection.    IgG4 is within normal limits. -Last CXR on 8/27  -Blood Cx showed NGTD at 5 Days & ACE  level was 25 -ESR was 11    Agitation suspect related to alcohol withdrawal-resolved. -There for any agitation hallucination, or psychosis at this time -Seen by psych no need for inpatient psych -Resolved  Normocytic Anemia -Chronic and appears stable -Hgb/Hct is now  12.7/36.8 on last check 06/02/20  Hypotension -Remains soft but asymptomatic -Given 1 Liter NS bolus on 06/07/20 -Continue to Monitor BP's per Protocol     DVT prophylaxis: Enoxaparin 40 mg sq q24h Code Status: FULL CODE Family Communication:  No family present at bedside  Disposition Plan: Pending Guardianship hearing  Status is: Inpatient  Remains inpatient appropriate because:Unsafe d/c plan   Dispo:  Patient From:  Home  Planned Disposition:  Group Home  Expected discharge date:  07/14/20 >>> ?? SNF  Medically stable for discharge:  YES   Consultants:   Neurology  Psychiatry    Procedures: None  Antimicrobials: Anti-infectives (From admission, onward)   None     Objective: Vitals:   06/13/20 1118 06/13/20 1608 06/13/20 2321 06/14/20 0837  BP: 96/62 95/62 112/72 98/69  Pulse: 74 86 87 69  Resp: _0 Temp: 98.4 F (36.9 C)  98.6 F (37 C) 97.7 F (36.5 C)  TempSrc: Oral  Oral Oral  SpO2: 96% 97% 96% 100%  Weight:      Height:        Intake/Output Summary (Last 24 hours) at 06/14/2020 1148 Last data filed at 06/14/2020 1018 Gross per 24 hour  Intake 360 ml  Output --  Net 360 ml   Filed Weights   05/29/20 0500 05/30/20 0450 05/31/20 0452  Weight: 66.8 kg 64.3 kg 65.5 kg        Physical Exam:   General:  Alert, oriented, cooperative, no distress;   HEENT:  Normocephalic, PERRL, otherwise with in Normal limits   Neuro:  CNII-XII intact. , normal motor and sensation, reflexes intact   Lungs:   Clear to auscultation BL, Respirations unlabored, no wheezes / crackles  Cardio:    S1/S2, RRR, No murmure, No Rubs or Gallops   Abdomen:   Soft, non-tender, bowel sounds active all four quadrants,  no guarding or peritoneal signs.  Muscular skeletal:  Limited exam - in bed, able to move all 4 extremities, Normal strength,  2+ pulses,  symmetric, No pitting edema  Skin:  Dry, warm to touch, negative for any Rashes, No open wounds  Wounds:  Please see nursing documentation               Data Reviewed: I have personally reviewed following labs and imaging studies  CBC: Recent Labs  Lab 06/09/20 0452  WBC 6.3  NEUTROABS 3.5  HGB 11.7*  HCT 35.2*  MCV 87.8  PLT 997   Basic Metabolic Panel: Recent Labs  Lab 06/09/20 0452  NA 141  K 3.8  CL 107  CO2 25  GLUCOSE 95  BUN 20  CREATININE 0.98  CALCIUM 8.5*  MG 1.8  PHOS 4.2   GFR: Estimated Creatinine Clearance: 83.5 mL/min (by C-G formula based on SCr of 0.98 mg/dL). Liver Function Tests: No results found for this or any previous visit (from the past 240 hour(s)).   RN Pressure Injury Documentation:       Radiology Studies: No results found.  Scheduled Meds: . cholecalciferol  1,000 Units Oral Daily  . clonazepam  0.5 mg Oral BID  . enoxaparin (LOVENOX) injection  40 mg Subcutaneous Q24H  . haloperidol  1 mg Oral BID  .  levETIRAcetam  1,500 mg Oral BID  . melatonin  5 mg Oral QHS  . OLANZapine  5 mg Oral QHS  . vitamin B-6  50 mg Oral Daily  . sodium chloride flush  10 mL Intravenous Q12H  . thiamine  100 mg Oral Daily  . vitamin B-12  1,000 mcg Oral Daily   Continuous Infusions: . lamoTRIgine (LAMICTAL) tablet 175 mg       LOS: 38 days   Deatra James, MD [facility] PAGER is on AMION  If 7PM-7AM, please contact night-coverage [email or web address]

## 2020-06-15 MED ORDER — CLONAZEPAM 0.5 MG PO TABS
0.5000 mg | ORAL_TABLET | Freq: Two times a day (BID) | ORAL | Status: DC
  Administered 2020-06-15 – 2020-08-12 (×116): 0.5 mg via ORAL
  Filled 2020-06-15 (×116): qty 1

## 2020-06-15 MED ORDER — HALOPERIDOL 0.5 MG PO TABS
0.5000 mg | ORAL_TABLET | Freq: Two times a day (BID) | ORAL | Status: DC
  Administered 2020-06-15 – 2020-08-12 (×116): 0.5 mg via ORAL
  Filled 2020-06-15 (×118): qty 1

## 2020-06-15 NOTE — Progress Notes (Signed)
PROGRESS NOTE    Lance Ray  [id number]:[id number] DOB: [birth year] DOA: 05/07/2020 PCP: Patient, No Pcp Per   Brief Narrative:  The patient is a disheveled 49 year old Caucasian male with a past medical history significant for but not limited to dementia, agitation, and history of seizure disorder.  He was initially admitted on 03/24/2020 under hospitalist service for generalized tonic-clonic seizures due to noncompliance with Depakote.  He was loaded at that time with IV lorazepam and Keppra and admitted to the PCU.  He had a negative MRI brain and head CT and his hospital course was complicated by psychosis and agitation and required Precedex drip for agitation.  Psychiatry was consulted ultimately he was discharged to behavioral health on 04/09/2020 and he was IVC.  Hospitalist team was reconsulted by behavioral health after sitter heard the patient screaming witnessed a generalized clonic seizure that lasted about 2 minutes.  Subsequently given 2 mg of IV Ativan and the seizure subsided.  Neurology was also consulted at that time.  Patient was transferred back to the hospital service and currently does not have the capacity make decisions for himself as deemed by psychiatry.  Currently the patient is awaiting legal guardianship prior to discharge and this cannot be done until next month as he is a legal guardianship hearing in October.  Currently he remains medically stable without signs of psychosis does not have a safe discharge disposition as he cannot make decisions for himself and currently does not have a legal guardian to make decisions for him.  He will remain hospitalized until his legal guardianship hearing eventually on October 19th as he needs placement in a group home.  06/07/20 Today he was a little hypotensive. Given 1 Liter of NS Bolus with improvement. Will Continue to Monitor BP per protocol   06/08/20 Blood Pressure is doing better today. Will check Labs in the AM   06/09/2020  -remained stable no changes, BP stable  9/16/  -  9/17-  9/18 - 9/19 -9/20/ blood pressure remained low, but stable no complaints  06/15/2020 -patient remained stable no issues overnight, blood pressures stabilized no complaints Plan of care discussed with social worker   Subjective:  The patient was seen and examined this morning, stable no complaints. Blood pressure 97/65     Assessment & Plan:   Principal Problem:   Seizure ([place]) Active Problems:   Psychosis ([place])   Hypotension  No changes to below plan, continue current medications   Breakthrough seizures, likely secondary to AEDs noncompliance- stable. -Stable no further episodes of seizures -AEDs adjusted by neurology -Currently on Lamotrigine 175 mg twice daily, Clonazepam 0.5 mg daily, Levitracetam 1500 mg twice daily. -Continue seizure precautions -C/w IV Lorazepam as needed for active seizures greater than 3 minutes as recommended by neurology -Neurology was following   -TOC has been consulted to assist with home health services and guardianship.  -Need for guardianship is barrier to discharge, hearing pending next Month on October 19th 2021.  Vitamin D Deficiency -Last Vitamin D Level noted to be 17.26 -Started on Cholecalciferol 1000 IU daily.   Recheck vitamin D level in about 3 months.  Seizure Disorder -Stable -Checked Lamictal and Keppra levels and last Check on 8/29 showed a Lamotrigine Level of 5.6 and a Levetiracetam Level of 64.6 -Cont current regimen of keppra and lamictal.  Leukocytosis with Eosinophilia  -Resolved -Unclear etiology and ? Reactive.  No clinical suspicion or evidence of infection.    IgG4 is within normal limits. -Last CXR  on 8/27  -Blood Cx showed NGTD at 5 Days & ACE level was 25 -ESR was 11    Agitation suspect related to alcohol withdrawal-resolved. -There for any agitation hallucination, or psychosis at this time -Seen by psych no need for inpatient  psych -Resolved  Normocytic Anemia -Chronic and appears stable -Hgb/Hct is now 12.7/36.8 on last check 06/02/20  Hypotension -Monitoring soft, asymptomatic -Given 1 Liter NS bolus on 06/07/20 -Continue to Monitor BP's per Protocol     DVT prophylaxis: Enoxaparin 40 mg sq q24h Code Status: FULL CODE Family Communication:  No family present at bedside  Disposition Plan: Pending Guardianship hearing  Status is: Inpatient  Remains inpatient appropriate because:Unsafe d/c plan   Dispo:  Patient From:  Home  Planned Disposition:  Group Home  Expected discharge date:  07/14/20 >>> ?? SNF  Medically stable for discharge:  YES   Consultants:   Neurology  Psychiatry    Procedures: None  Antimicrobials: Anti-infectives (From admission, onward)   None     Objective: Vitals:   06/14/20 2317 06/15/20 0310 06/15/20 0827 06/15/20 0829  BP: 95/67 97/61 97/65   Pulse: 80 71 69   Resp: _0 Temp: 98.2 F (36.8 C) 98.4 F (36.9 C) 97.6 F (36.4 C)   TempSrc: Oral Oral Oral   SpO2: 97% 97% 97%   Weight:      Height:        Intake/Output Summary (Last 24 hours) at 06/15/2020 1149 Last data filed at 06/15/2020 1006 Gross per 24 hour  Intake 960 ml  Output --  Net 960 ml   Filed Weights   05/29/20 0500 05/30/20 0450 05/31/20 0452  Weight: 66.8 kg 64.3 kg 65.5 kg          Physical Exam:   General:  Alert, oriented, cooperative, no distress;   HEENT:  Normocephalic, PERRL, otherwise with in Normal limits   Neuro:  CNII-XII intact. , normal motor and sensation, reflexes intact   Lungs:   Clear to auscultation BL, Respirations unlabored, no wheezes / crackles  Cardio:    S1/S2, RRR, No murmure, No Rubs or Gallops   Abdomen:   Soft, non-tender, bowel sounds active all four quadrants,  no guarding or peritoneal signs.  Muscular skeletal:  Limited exam - in bed, able to move all 4 extremities, Normal strength,  2+ pulses,  symmetric, No pitting edema   Skin:  Dry, warm to touch, negative for any Rashes, No open wounds  Wounds: Please see nursing documentation                 Data Reviewed: I have personally reviewed following labs and imaging studies  CBC: Recent Labs  Lab 06/09/20 0452  WBC 6.3  NEUTROABS 3.5  HGB 11.7*  HCT 35.2*  MCV 87.8  PLT 518   Basic Metabolic Panel: Recent Labs  Lab 06/09/20 0452  NA 141  K 3.8  CL 107  CO2 25  GLUCOSE 95  BUN 20  CREATININE 0.98  CALCIUM 8.5*  MG 1.8  PHOS 4.2   GFR: Estimated Creatinine Clearance: 83.5 mL/min (by C-G formula based on SCr of 0.98 mg/dL). Liver Function Tests: No results found for this or any previous visit (from the past 240 hour(s)).   RN Pressure Injury Documentation:       Radiology Studies: No results found.  Scheduled Meds: . cholecalciferol  1,000 Units Oral Daily  . clonazepam  0.5 mg Oral BID  . enoxaparin (  LOVENOX) injection  40 mg Subcutaneous Q24H  . haloperidol  0.5 mg Oral BID  . levETIRAcetam  1,500 mg Oral BID  . melatonin  5 mg Oral QHS  . OLANZapine  5 mg Oral QHS  . vitamin B-6  50 mg Oral Daily  . sodium chloride flush  10 mL Intravenous Q12H  . thiamine  100 mg Oral Daily  . vitamin B-12  1,000 mcg Oral Daily   Continuous Infusions: . lamoTRIgine (LAMICTAL) tablet 175 mg       LOS: 69 days   Deatra James, MD [facility] PAGER is on AMION  If 7PM-7AM, please contact night-coverage [email or web address]

## 2020-06-16 NOTE — Progress Notes (Signed)
°PROGRESS NOTE ° ° ° °Lance Ray  MRN:[id number] DOB: 08/28/1971 DOA: 05/07/2020 °PCP: Patient, No Pcp Per  ° °Brief Narrative:  °The patient is a disheveled 49-year-old Caucasian male with a past medical history significant for but not limited to dementia, agitation, and history of seizure disorder.  He was initially admitted on 03/24/2020 under hospitalist service for generalized tonic-clonic seizures due to noncompliance with Depakote.  He was loaded at that time with IV lorazepam and Keppra and admitted to the PCU.  He had a negative MRI brain and head CT and his hospital course was complicated by psychosis and agitation and required Precedex drip for agitation.  Psychiatry was consulted ultimately he was discharged to behavioral health on 04/09/2020 and he was IVC.  Hospitalist team was reconsulted by behavioral health after sitter heard the patient screaming witnessed a generalized clonic seizure that lasted about 2 minutes.  Subsequently given 2 mg of IV Ativan and the seizure subsided.  Neurology was also consulted at that time.  Patient was transferred back to the hospital service and currently does not have the capacity make decisions for himself as deemed by psychiatry.  Currently the patient is awaiting legal guardianship prior to discharge and this cannot be done until next month as he is a legal guardianship hearing in October.  Currently he remains medically stable without signs of psychosis does not have a safe discharge disposition as he cannot make decisions for himself and currently does not have a legal guardian to make decisions for him.  He will remain hospitalized until his legal guardianship hearing eventually on October 19th as he needs placement in a group home. ° °On 06/07/2020 he was a little hypotensive and was given 1 L normal saline bolus with improvement.  Blood pressures have been relatively soft the last week or so but remained stable and he has no active complaints.  Still  awaiting legal guardianship hearing. ° °Assessment & Plan: °  °Principal Problem: °  Seizure (HCC) °Active Problems: °  Psychosis (HCC) °  Hypotension ° °Breakthrough seizures, likely secondary to AEDs noncompliance- stable. °-AEDs adjusted by neurology °-Currently on Lamotrigine 175 mg twice daily, Clonazepam 0.5 mg daily, Levitracetam 1500 mg twice daily. °-Continue seizure precautions °-C/w IV Lorazepam as needed for active seizures greater than 3 minutes as recommended by neurology °-Neurology was consulted °-Would benefit from home health RN to assist with his medications.   °-TOC has been consulted to assist with home health services and guardianship.   °-Need for guardianship is barrier to discharge, hearing pending next Month on October 19th 2021. °  °Vitamin D Deficiency °-Last Vitamin D Level noted to be 17.26 °-Started on Cholecalciferol 1000 IU daily.   °-Recheck vitamin D level in about 3 months. °  °Seizure Disorder °-Management per above °-Checked Lamictal and Keppra levels and last Check on 8/29 showed a Lamotrigine Level of 5.6 and a Levetiracetam Level of 64.6 °-Continue current regimen of keppra and lamictal.  °   °Leukocytosis with Eosinophilia  °-Not present on admission, mild.  Resolved.   °-Unclear etiology and ? Reactive.  No clinical suspicion or evidence of infection.   °-New on 8/25, was normal previously, WBC 11.4>>11.3>>12.2>>12.6 >>9.3; Now WBC is normalized and 6.3 on last check (06/09/20) °-Differential includes allergic reaction, infections (especially parasitic), drug reaction, autoimmune conditions (sarcoidosis, rheumatoid arthritis, inflammatory bowel disease, IgG4 disease), inflammatory condition or some malignancies can also cause.   °-He is without respiratory symptoms, skin findings, organomegaly or lymphadenopathy on exam. °IgG4 is within normal limits. °-CXR   CXR on 8/27 showed low lung volumes, with right basilar atelectasis and bandlike opacity in the left midlung, atelectasis  versus scarring.  Mediastinal contours were normal without hilar lymphadenopathy. -Last Blood Cx showed NGTD at 5 Days & ACE level was 25 -ESR was 11 -Repeat CBC in the AM   Agitation suspect related to alcohol withdrawal-resolved. -Admitted from behavioral health -No evidence of alcohol withdrawal at the time of this visit. -Continue psych medications with Olanzapine 5 mg po qHS and Haldol 2 mg po BID, multivitamins, thiamine and folic acid.  -Seen by psych no need for inpatient psych -Agitation has improved  Normocytic Anemia -Chronic and appears stable -Hgb/Hct is now 11.7/35.2 on last check 06/02/20 -Continue to Monitor for S/Sx of Bleeding; Currently no overt bleeding noted -Repeat CBC intermittently and will repeat in the AM   Hypotension -Mild with Soft Blood pressures -Given 1 Liter NS bolus on 06/07/20 -BP Last check was 99/69 -Continue to Monitor BP per Protocol.   DVT prophylaxis: Enoxaparin 40 mg sq q24h Code Status: FULL CODE Family Communication:  No family present at bedside  Disposition Plan: Pending Guardianship hearing  Status is: Inpatient  Remains inpatient appropriate because:Unsafe d/c plan   Dispo:  Patient From:  Home  Planned Disposition:  Group Home  Expected discharge date:  07/14/20  Medically stable for discharge:  YES   Consultants:   Neurology  Psychiatry    Procedures: None  Antimicrobials: Anti-infectives (From admission, onward)   None     Subjective: Seen and examined at bedside and the patient had no active complaints.  Denies any chest pain, lightheadedness or dizziness.  Felt okay.  No other concerns or complaints at this time.  Objective: Vitals:   06/15/20 1955 06/15/20 2339 06/16/20 0430 06/16/20 0738  BP: 100/65 100/67 (!) 96/56 99/69  Pulse: 86 76 65 65  Resp:  _0 Temp: 98.4 F (36.9 C) 98.2 F (36.8 C) 97.7 F (36.5 C)   TempSrc: Oral Oral Oral   SpO2: 96% 97% 96% 97%  Weight:      Height:         Intake/Output Summary (Last 24 hours) at 06/16/2020 3790 Last data filed at 06/15/2020 1901 Gross per 24 hour  Intake 960 ml  Output --  Net 960 ml   Filed Weights   05/29/20 0500 05/30/20 0450 05/31/20 0452  Weight: 66.8 kg 64.3 kg 65.5 kg   Examination:  Physical Exam:  Constitutional: The patient is a thin chronically ill-appearing slightly disheveled Caucasian male currently in no acute distress Respiratory: Slight diminished to auscultation bilaterally but no appreciable wheezing, rales, rhonchi.  Unlabored breathing Cardiovascular: Regular rate and rhythm.  No appreciable murmurs, rubs or gallops Abdomen: Soft, nontender, nondistended. GU: Deferred Neurologic: Cranial nerves II through XII grossly intact with no focal deficits  Data Reviewed: I have personally reviewed following labs and imaging studies  CBC: No results for input(s): WBC, NEUTROABS, HGB, HCT, MCV, PLT in the last 168 hours. Basic Metabolic Panel: No results for input(s): NA, K, CL, CO2, GLUCOSE, BUN, CREATININE, CALCIUM, MG, PHOS in the last 168 hours. GFR: Estimated Creatinine Clearance: 83.5 mL/min (by C-G formula based on SCr of 0.98 mg/dL). Liver Function Tests: No results for input(s): AST, ALT, ALKPHOS, BILITOT, PROT, ALBUMIN in the last 168 hours. No results for input(s): LIPASE, AMYLASE in the last 168 hours. No results for input(s): AMMONIA in the last 168 hours. Coagulation Profile: No results for input(s): INR, PROTIME in the  last 168 hours. Cardiac Enzymes: No results for input(s): CKTOTAL, CKMB, CKMBINDEX, TROPONINI in the last 168 hours. BNP (last 3 results) No results for input(s): PROBNP in the last 8760 hours. HbA1C: No results for input(s): HGBA1C in the last 72 hours. CBG: No results for input(s): GLUCAP in the last 168 hours. Lipid Profile: No results for input(s): CHOL, HDL, LDLCALC, TRIG, CHOLHDL, LDLDIRECT in the last 72 hours. Thyroid Function Tests: No results for  input(s): TSH, T4TOTAL, FREET4, T3FREE, THYROIDAB in the last 72 hours. Anemia Panel: No results for input(s): VITAMINB12, FOLATE, FERRITIN, TIBC, IRON, RETICCTPCT in the last 72 hours. Sepsis Labs: No results for input(s): PROCALCITON, LATICACIDVEN in the last 168 hours.  No results found for this or any previous visit (from the past 240 hour(s)).   RN Pressure Injury Documentation:     Estimated body mass index is 21.31 kg/m as calculated from the following:   Height as of this encounter: 5' 9" (1.753 m).   Weight as of this encounter: 65.5 kg.  Malnutrition Type:      Malnutrition Characteristics:      Nutrition Interventions:    Radiology Studies: No results found.  Scheduled Meds:  cholecalciferol  1,000 Units Oral Daily   clonazePAM  0.5 mg Oral BID   enoxaparin (LOVENOX) injection  40 mg Subcutaneous Q24H   haloperidol  0.5 mg Oral BID   levETIRAcetam  1,500 mg Oral BID   melatonin  5 mg Oral QHS   OLANZapine  5 mg Oral QHS   vitamin B-6  50 mg Oral Daily   sodium chloride flush  10 mL Intravenous Q12H   thiamine  100 mg Oral Daily   vitamin B-12  1,000 mcg Oral Daily   Continuous Infusions:  lamoTRIgine (LAMICTAL) tablet 175 mg       LOS: 40 days   Kerney Elbe, DO [facility] PAGER is on AMION  If 7PM-7AM, please contact night-coverage [email or web address]

## 2020-06-17 NOTE — Progress Notes (Signed)
°PROGRESS NOTE ° ° ° °Lance Ray  MRN:[id number] DOB: 07/29/1971 DOA: 05/07/2020 °PCP: Patient, No Pcp Per  ° °Brief Narrative:  °The patient is a disheveled 49-year-old Caucasian male with a past medical history significant for but not limited to dementia, agitation, and history of seizure disorder.  He was initially admitted on 03/24/2020 under hospitalist service for generalized tonic-clonic seizures due to noncompliance with Depakote.  He was loaded at that time with IV lorazepam and Keppra and admitted to the PCU.  He had a negative MRI brain and head CT and his hospital course was complicated by psychosis and agitation and required Precedex drip for agitation.  Psychiatry was consulted ultimately he was discharged to behavioral health on 04/09/2020 and he was IVC.  Hospitalist team was reconsulted by behavioral health after sitter heard the patient screaming witnessed a generalized clonic seizure that lasted about 2 minutes.  Subsequently given 2 mg of IV Ativan and the seizure subsided.  Neurology was also consulted at that time.  Patient was transferred back to the hospital service and currently does not have the capacity make decisions for himself as deemed by psychiatry.  Currently the patient is awaiting legal guardianship prior to discharge and this cannot be done until next month as he is a legal guardianship hearing in October.  Currently he remains medically stable without signs of psychosis does not have a safe discharge disposition as he cannot make decisions for himself and currently does not have a legal guardian to make decisions for him.  He will remain hospitalized until his legal guardianship hearing eventually on October 19th as he needs placement in a group home. ° °On 06/07/2020 he was a little hypotensive and was given 1 L normal saline bolus with improvement.  Blood pressures have been relatively soft the last week or so but remained stable and he has no active complaints.  Still  awaiting legal guardianship hearing. ° °Assessment & Plan: °  °Principal Problem: °  Seizure (HCC) °Active Problems: °  Psychosis (HCC) °  Hypotension ° °Breakthrough seizures, likely secondary to AEDs noncompliance- stable. °-AEDs adjusted by neurology °-Currently on Lamotrigine 175 mg twice daily, Clonazepam 0.5 mg daily, Levitracetam 1500 mg twice daily. °-Continue seizure precautions °-C/w IV Lorazepam as needed for active seizures greater than 3 minutes as recommended by neurology °-Neurology was consulted °-Would benefit from home health RN to assist with his medications.   °-TOC has been consulted to assist with home health services and guardianship.   °-Need for guardianship is barrier to discharge, hearing pending next Month on October 19th 2021. °  °Vitamin D Deficiency °-Last Vitamin D Level noted to be 17.26 °-Started on Cholecalciferol 1000 IU daily.   °-Recheck vitamin D level in about 3 months. °  °Seizure Disorder °-Management per above °-Checked Lamictal and Keppra levels and last Check on 8/29 showed a Lamotrigine Level of 5.6 and a Levetiracetam Level of 64.6 °-Continue current regimen of keppra and lamictal.  °   °Leukocytosis with Eosinophilia  °-Not present on admission, mild.  Resolved.   °-Unclear etiology and ? Reactive.  No clinical suspicion or evidence of infection.   °-New on 8/25, was normal previously, WBC 11.4>>11.3>>12.2>>12.6 >>9.3; Now WBC is normalized and 6.3 on last check (06/09/20) °-Differential includes allergic reaction, infections (especially parasitic), drug reaction, autoimmune conditions (sarcoidosis, rheumatoid arthritis, inflammatory bowel disease, IgG4 disease), inflammatory condition or some malignancies can also cause.   °-He is without respiratory symptoms, skin findings, organomegaly or lymphadenopathy on exam. °IgG4 is within normal limits. °-CXR   CXR on 8/27 showed low lung volumes, with right basilar atelectasis and bandlike opacity in the left midlung, atelectasis  versus scarring.  Mediastinal contours were normal without hilar lymphadenopathy. -Last Blood Cx showed NGTD at 5 Days & ACE level was 25 -ESR was 11 -Repeat CBC in the AM   Agitation suspect related to alcohol withdrawal-resolved. -Admitted from behavioral health -No evidence of alcohol withdrawal at the time of this visit. -Continue psych medications with Olanzapine 5 mg po qHS and Haldol 2 mg po BID, multivitamins, thiamine and folic acid.  -Seen by psych no need for inpatient psych -Agitation has improved  Normocytic Anemia -Chronic and appears stable -Hgb/Hct is now 11.7/35.2 on last check 06/02/20 -Continue to Monitor for S/Sx of Bleeding; Currently no overt bleeding noted -Repeat CBC intermittently and will repeat in the AM   Hypotension -Mild with Soft Blood pressures -Given 1 Liter NS bolus on 06/07/20 -BP Last check was 99/69 -Continue to Monitor BP per Protocol.   DVT prophylaxis: Enoxaparin 40 mg sq q24h Code Status: FULL CODE Family Communication:  No family present at bedside  Disposition Plan: Pending Guardianship hearing  Status is: Inpatient  Remains inpatient appropriate because:Unsafe d/c plan   Dispo:  Patient From:  Home  Planned Disposition:  Group Home  Expected discharge date:  07/14/20  Medically stable for discharge:  YES   Consultants:   Neurology  Psychiatry    Procedures: None  Antimicrobials: Anti-infectives (From admission, onward)   None     Subjective: Seen and examined at bedside and he is doing ok without any complaints. No nausea or vomiting. No CP or SOB. States he got sleep last night. No other concerns or complaints at this time.   Objective: Vitals:   06/16/20 2036 06/17/20 0007 06/17/20 0442 06/17/20 0748  BP: 106/72 105/69 102/67 110/73  Pulse: 87 80 69 67  Resp: _0 Temp: (!) 97.5 F (36.4 C) 97.9 F (36.6 C) 98.1 F (36.7 C) 98.2 F (36.8 C)  TempSrc: Oral Oral Oral   SpO2: 96% 96% 98% 97%    Weight:      Height:        Intake/Output Summary (Last 24 hours) at 06/17/2020 8592 Last data filed at 06/16/2020 1900 Gross per 24 hour  Intake 960 ml  Output --  Net 960 ml   Filed Weights   05/29/20 0500 05/30/20 0450 05/31/20 0452  Weight: 66.8 kg 64.3 kg 65.5 kg   Examination:  Physical Exam:  Constitutional: The patient is a thin chronically ill-appearing slightly disheveled Caucasian male currently in no acute distress Respiratory: Mildly diminished to auscultation bilaterally but no appreciable wheezing, rales, rhonchi.  Patient has unlabored breathing and not wearing any supplemental oxygen via nasal cannula Cardiovascular: Regular rate and rhythm.  No appreciable murmurs, rubs, gallops. Abdomen: Soft, nontender, nondistended.  Bowel sounds present GU: Deferred Neurologic: Cranial nerves II through XII grossly intact no appreciable focal deficits  Data Reviewed: I have personally reviewed following labs and imaging studies  CBC: No results for input(s): WBC, NEUTROABS, HGB, HCT, MCV, PLT in the last 168 hours. Basic Metabolic Panel: No results for input(s): NA, K, CL, CO2, GLUCOSE, BUN, CREATININE, CALCIUM, MG, PHOS in the last 168 hours. GFR: Estimated Creatinine Clearance: 83.5 mL/min (by C-G formula based on SCr of 0.98 mg/dL). Liver Function Tests: No results for input(s): AST, ALT, ALKPHOS, BILITOT, PROT, ALBUMIN in the last 168 hours. No results for input(s): LIPASE, AMYLASE in the last  hours. °No results for input(s): AMMONIA in the last 168 hours. °Coagulation Profile: °No results for input(s): INR, PROTIME in the last 168 hours. °Cardiac Enzymes: °No results for input(s): CKTOTAL, CKMB, CKMBINDEX, TROPONINI in the last 168 hours. °BNP (last 3 results) °No results for input(s): PROBNP in the last 8760 hours. °HbA1C: °No results for input(s): HGBA1C in the last 72 hours. °CBG: °No results for input(s): GLUCAP in the last 168 hours. °Lipid Profile: °No results  for input(s): CHOL, HDL, LDLCALC, TRIG, CHOLHDL, LDLDIRECT in the last 72 hours. °Thyroid Function Tests: °No results for input(s): TSH, T4TOTAL, FREET4, T3FREE, THYROIDAB in the last 72 hours. °Anemia Panel: °No results for input(s): VITAMINB12, FOLATE, FERRITIN, TIBC, IRON, RETICCTPCT in the last 72 hours. °Sepsis Labs: °No results for input(s): PROCALCITON, LATICACIDVEN in the last 168 hours. ° °No results found for this or any previous visit (from the past 240 hour(s)).  ° °RN Pressure Injury Documentation: °  °  °Estimated body mass index is 21.31 kg/m² as calculated from the following: °  Height as of this encounter: 5' 9" (1.753 m). °  Weight as of this encounter: 65.5 kg. ° °Malnutrition Type: ° °  ° ° °Malnutrition Characteristics: ° °  ° ° °Nutrition Interventions: ° °  °Radiology Studies: °No results found. ° °Scheduled Meds: °• cholecalciferol  1,000 Units Oral Daily  °• clonazePAM  0.5 mg Oral BID  °• enoxaparin (LOVENOX) injection  40 mg Subcutaneous Q24H  °• haloperidol  0.5 mg Oral BID  °• levETIRAcetam  1,500 mg Oral BID  °• melatonin  5 mg Oral QHS  °• OLANZapine  5 mg Oral QHS  °• vitamin B-6  50 mg Oral Daily  °• sodium chloride flush  10 mL Intravenous Q12H  °• thiamine  100 mg Oral Daily  °• vitamin B-12  1,000 mcg Oral Daily  ° °Continuous Infusions: °• lamoTRIgine (LAMICTAL) tablet 175 mg    ° ° LOS: 41 days  ° ° Latif , DO °[facility] °PAGER is on AMION ° °If 7PM-7AM, please contact night-coverage [email or web address] ° ° °

## 2020-06-18 NOTE — Plan of Care (Signed)
  Problem: Activity: Goal: Risk for activity intolerance will decrease Outcome: Progressing   Problem: Education: Goal: Expressions of having a comfortable level of knowledge regarding the disease process will increase Outcome: Progressing

## 2020-06-18 NOTE — Progress Notes (Signed)
PROGRESS NOTE    GOVERNOR MATOS  WCB:[id number] DOB: 1971-04-18 DOA: 05/07/2020 PCP: Patient, No Pcp Per   Brief Narrative:  The patient is a disheveled 49 year old Caucasian male with a past medical history significant for but not limited to dementia, agitation, and history of seizure disorder.  He was initially admitted on 03/24/2020 under hospitalist service for generalized tonic-clonic seizures due to noncompliance with Depakote.  He was loaded at that time with IV lorazepam and Keppra and admitted to the PCU.  He had a negative MRI brain and head CT and his hospital course was complicated by psychosis and agitation and required Precedex drip for agitation.  Psychiatry was consulted ultimately he was discharged to behavioral health on 04/09/2020 and he was IVC.  Hospitalist team was reconsulted by behavioral health after sitter heard the patient screaming witnessed a generalized clonic seizure that lasted about 2 minutes.  Subsequently given 2 mg of IV Ativan and the seizure subsided.  Neurology was also consulted at that time.  Patient was transferred back to the hospital service and currently does not have the capacity make decisions for himself as deemed by psychiatry.  Currently the patient is awaiting legal guardianship prior to discharge and this cannot be done until next month as he is a legal guardianship hearing in October.  Currently he remains medically stable without signs of psychosis does not have a safe discharge disposition as he cannot make decisions for himself and currently does not have a legal guardian to make decisions for him.  He will remain hospitalized until his legal guardianship hearing eventually on October 19th as he needs placement in a group home.  On 06/07/2020 he was a little hypotensive and was given 1 L normal saline bolus with improvement.  Blood pressures have been relatively soft the last week or so but remained stable and he has no active complaints.  Still  awaiting legal guardianship hearing.  Assessment & Plan:   Principal Problem:   Seizure ([place]) Active Problems:   Psychosis ([place])   Hypotension  Breakthrough seizures, likely secondary to AEDs noncompliance- stable. -AEDs adjusted by neurology -Currently on Lamotrigine 175 mg twice daily, Clonazepam 0.5 mg daily, Levitracetam 1500 mg twice daily. -Continue seizure precautions -C/w IV Lorazepam as needed for active seizures greater than 3 minutes as recommended by neurology -Neurology was consulted -Would benefit from home health RN to assist with his medications.  -TOC has been consulted to assist with home health services and guardianship.  -Need for guardianship is barrier to discharge, hearing pending next Month on October 19th 2021.  Vitamin D Deficiency -Last Vitamin D Level noted to be 17.26 -Started on Cholecalciferol 1000 IU daily.   -Recheck vitamin D level in about 3 months.  Seizure Disorder -Management per above -Checked Lamictal and Keppra levels and last Check on 8/29 showed a Lamotrigine Level of 5.6 and a Levetiracetam Level of 64.6 -Continue current regimen of keppra and lamictal.  Leukocytosis with Eosinophilia  -Not present on admission, mild.  Resolved.   -Unclear etiology and ? Reactive.  No clinical suspicion or evidence of infection.   -New on 8/25, was normal previously, WBC 11.4>>11.3>>12.2>>12.6 >>9.3; Now WBC is normalized and 6.3 on last check (06/09/20) -Differential includes allergic reaction, infections (especially parasitic), drug reaction, autoimmune conditions (sarcoidosis, rheumatoid arthritis, inflammatory bowel disease, IgG4 disease), inflammatory condition or some malignancies can also cause.   -He is without respiratory symptoms, skin findings, organomegaly or lymphadenopathy on exam. IgG4 is within normal limits. -CXR  on 8/27 showed low lung volumes, with right basilar atelectasis and bandlike opacity in the left midlung, atelectasis  versus scarring.  Mediastinal contours were normal without hilar lymphadenopathy. -Last Blood Cx showed NGTD at 5 Days & ACE level was 25 -ESR was 11 -Repeat CBC in the AM   Agitation suspect related to alcohol withdrawal-resolved. -Admitted from behavioral health -No evidence of alcohol withdrawal at the time of this visit. -Continue psych medications with Olanzapine 5 mg po qHS and Haldol 2 mg po BID, multivitamins, thiamine and folic acid.  -Seen by psych no need for inpatient psych -Agitation has improved  Normocytic Anemia -Chronic and appears stable -Hgb/Hct is now 11.7/35.2 on last check 06/02/20 -Continue to Monitor for S/Sx of Bleeding; Currently no overt bleeding noted -Repeat CBC intermittently and will repeat in the AM   Hypotension -Mild with Soft Blood pressures -Given 1 Liter NS bolus on 06/07/20 -BP Last check was 99/69 -Continue to Monitor BP per Protocol.   DVT prophylaxis: Enoxaparin 40 mg sq q24h Code Status: FULL CODE Family Communication:  No family present at bedside  Disposition Plan: Pending Guardianship hearing  Status is: Inpatient  Remains inpatient appropriate because:Unsafe d/c plan   Dispo:  Patient From:  Home  Planned Disposition:  Group Home  Expected discharge date:  07/14/20  Medically stable for discharge:  YES   Consultants:   Neurology  Psychiatry    Procedures: None  Antimicrobials: Anti-infectives (From admission, onward)   None     Subjective: Seen and examined at bedside again he is without complaints.  Denies any chest pain, lightheadedness or dizziness.  No nausea or vomiting.  States he had a bowel movement yesterday.  No other concerns or complaints at this time.  Objective: Vitals:   06/17/20 2003 06/17/20 2353 06/18/20 0311 06/18/20 0759  BP: 106/70 116/85 118/71 102/72  Pulse: 86 92 75 67  Resp: _0 Temp: 98.7 F (37.1 C) 98.2 F (36.8 C) 98.4 F (36.9 C) 97.6 F (36.4 C)  TempSrc: Oral Oral  Oral Oral  SpO2: 97% 97% 97% 97%  Weight:      Height:        Intake/Output Summary (Last 24 hours) at 06/18/2020 1102 Last data filed at 06/18/2020 0900 Gross per 24 hour  Intake 1200 ml  Output --  Net 1200 ml   Filed Weights   05/29/20 0500 05/30/20 0450 05/31/20 0452  Weight: 66.8 kg 64.3 kg 65.5 kg   Examination:  Physical Exam:  Constitutional: Chronically ill-appearing slightly disheveled Caucasian male currently no acute distress appears comfortable laying in the bed watching television Respiratory: Mildly diminished to auscultation but no appreciable wheezing, rales, rhonchi.  He is unlabored breathing is not wearing any supplemental oxygen via nasal cannula Cardiovascular: Regular rate and rhythm.  No appreciable murmurs, rubs, gallops Abdomen: Soft, nontender, nondistended.  Bowel sounds present GU: Deferred Neurologic: Cranial nerves II through XII grossly intact with no appreciable focal deficits  Data Reviewed: I have personally reviewed following labs and imaging studies  CBC: No results for input(s): WBC, NEUTROABS, HGB, HCT, MCV, PLT in the last 168 hours. Basic Metabolic Panel: No results for input(s): NA, K, CL, CO2, GLUCOSE, BUN, CREATININE, CALCIUM, MG, PHOS in the last 168 hours. GFR: Estimated Creatinine Clearance: 83.5 mL/min (by C-G formula based on SCr of 0.98 mg/dL). Liver Function Tests: No results for input(s): AST, ALT, ALKPHOS, BILITOT, PROT, ALBUMIN in the last 168 hours. No results for input(s): LIPASE, AMYLASE  in the last 168 hours. No results for input(s): AMMONIA in the last 168 hours. Coagulation Profile: No results for input(s): INR, PROTIME in the last 168 hours. Cardiac Enzymes: No results for input(s): CKTOTAL, CKMB, CKMBINDEX, TROPONINI in the last 168 hours. BNP (last 3 results) No results for input(s): PROBNP in the last 8760 hours. HbA1C: No results for input(s): HGBA1C in the last 72 hours. CBG: No results for input(s):  GLUCAP in the last 168 hours. Lipid Profile: No results for input(s): CHOL, HDL, LDLCALC, TRIG, CHOLHDL, LDLDIRECT in the last 72 hours. Thyroid Function Tests: No results for input(s): TSH, T4TOTAL, FREET4, T3FREE, THYROIDAB in the last 72 hours. Anemia Panel: No results for input(s): VITAMINB12, FOLATE, FERRITIN, TIBC, IRON, RETICCTPCT in the last 72 hours. Sepsis Labs: No results for input(s): PROCALCITON, LATICACIDVEN in the last 168 hours.  No results found for this or any previous visit (from the past 240 hour(s)).   RN Pressure Injury Documentation:     Estimated body mass index is 21.31 kg/m as calculated from the following:   Height as of this encounter: _0  (1.753 m).   Weight as of this encounter: 65.5 kg.  Malnutrition Type:      Malnutrition Characteristics:      Nutrition Interventions:    Radiology Studies: No results found.  Scheduled Meds: . cholecalciferol  1,000 Units Oral Daily  . clonazePAM  0.5 mg Oral BID  . enoxaparin (LOVENOX) injection  40 mg Subcutaneous Q24H  . haloperidol  0.5 mg Oral BID  . levETIRAcetam  1,500 mg Oral BID  . melatonin  5 mg Oral QHS  . OLANZapine  5 mg Oral QHS  . vitamin B-6  50 mg Oral Daily  . sodium chloride flush  10 mL Intravenous Q12H  . thiamine  100 mg Oral Daily  . vitamin B-12  1,000 mcg Oral Daily   Continuous Infusions: . lamoTRIgine (LAMICTAL) tablet 175 mg      LOS: 28 days   Kerney Elbe, DO [facility] PAGER is on AMION  If 7PM-7AM, please contact night-coverage [email or web address]

## 2020-06-19 NOTE — Progress Notes (Signed)
PROGRESS NOTE    Lance Ray  WCB:[id number] DOB: 1971-04-18 DOA: 05/07/2020 PCP: Patient, No Pcp Per   Brief Narrative:  The patient is a disheveled 49 year old Caucasian male with a past medical history significant for but not limited to dementia, agitation, and history of seizure disorder.  He was initially admitted on 03/24/2020 under hospitalist service for generalized tonic-clonic seizures due to noncompliance with Depakote.  He was loaded at that time with IV lorazepam and Keppra and admitted to the PCU.  He had a negative MRI brain and head CT and his hospital course was complicated by psychosis and agitation and required Precedex drip for agitation.  Psychiatry was consulted ultimately he was discharged to behavioral health on 04/09/2020 and he was IVC.  Hospitalist team was reconsulted by behavioral health after sitter heard the patient screaming witnessed a generalized clonic seizure that lasted about 2 minutes.  Subsequently given 2 mg of IV Ativan and the seizure subsided.  Neurology was also consulted at that time.  Patient was transferred back to the hospital service and currently does not have the capacity make decisions for himself as deemed by psychiatry.  Currently the patient is awaiting legal guardianship prior to discharge and this cannot be done until next month as he is a legal guardianship hearing in October.  Currently he remains medically stable without signs of psychosis does not have a safe discharge disposition as he cannot make decisions for himself and currently does not have a legal guardian to make decisions for him.  He will remain hospitalized until his legal guardianship hearing eventually on October 19th as he needs placement in a group home.  On 06/07/2020 he was a little hypotensive and was given 1 L normal saline bolus with improvement.  Blood pressures have been relatively soft the last week or so but remained stable and he has no active complaints.  Still  awaiting legal guardianship hearing.  Assessment & Plan:   Principal Problem:   Seizure ([place]) Active Problems:   Psychosis ([place])   Hypotension  Breakthrough seizures, likely secondary to AEDs noncompliance- stable. -AEDs adjusted by neurology -Currently on Lamotrigine 175 mg twice daily, Clonazepam 0.5 mg daily, Levitracetam 1500 mg twice daily. -Continue seizure precautions -C/w IV Lorazepam as needed for active seizures greater than 3 minutes as recommended by neurology -Neurology was consulted -Would benefit from home health RN to assist with his medications.  -TOC has been consulted to assist with home health services and guardianship.  -Need for guardianship is barrier to discharge, hearing pending next Month on October 19th 2021.  Vitamin D Deficiency -Last Vitamin D Level noted to be 17.26 -Started on Cholecalciferol 1000 IU daily.   -Recheck vitamin D level in about 3 months.  Seizure Disorder -Management per above -Checked Lamictal and Keppra levels and last Check on 8/29 showed a Lamotrigine Level of 5.6 and a Levetiracetam Level of 64.6 -Continue current regimen of keppra and lamictal.  Leukocytosis with Eosinophilia  -Not present on admission, mild.  Resolved.   -Unclear etiology and ? Reactive.  No clinical suspicion or evidence of infection.   -New on 8/25, was normal previously, WBC 11.4>>11.3>>12.2>>12.6 >>9.3; Now WBC is normalized and 6.3 on last check (06/09/20) -Differential includes allergic reaction, infections (especially parasitic), drug reaction, autoimmune conditions (sarcoidosis, rheumatoid arthritis, inflammatory bowel disease, IgG4 disease), inflammatory condition or some malignancies can also cause.   -He is without respiratory symptoms, skin findings, organomegaly or lymphadenopathy on exam. IgG4 is within normal limits. -CXR  on 8/27 showed low lung volumes, with right basilar atelectasis and bandlike opacity in the left midlung, atelectasis  versus scarring.  Mediastinal contours were normal without hilar lymphadenopathy. -Last Blood Cx showed NGTD at 5 Days & ACE level was 25 -ESR was 11 -Repeat CBC in the AM   Agitation suspect related to alcohol withdrawal-resolved. -Admitted from behavioral health -No evidence of alcohol withdrawal at the time of this visit. -Continue psych medications with Olanzapine 5 mg po qHS and Haldol 2 mg po BID, multivitamins, thiamine and folic acid.  -Seen by psych no need for inpatient psych -Agitation has improved  Normocytic Anemia -Chronic and appears stable -Hgb/Hct is now 11.7/35.2 on last check 06/09/20 -Continue to Monitor for S/Sx of Bleeding; Currently no overt bleeding noted -Repeat CBC intermittently and will repeat in the AM   Hypotension -Mild with Soft Blood pressures -Given 1 Liter NS bolus on 06/07/20 -BP Last check was 99/69 -Continue to Monitor BP per Protocol.   DVT prophylaxis: Enoxaparin 40 mg sq q24h Code Status: FULL CODE Family Communication:  No family present at bedside  Disposition Plan: Pending Guardianship hearing  Status is: Inpatient  Remains inpatient appropriate because:Unsafe d/c plan   Dispo:  Patient From:  Home  Planned Disposition:  Group Home  Expected discharge date:  07/14/20  Medically stable for discharge:  YES   Consultants:   Neurology  Psychiatry    Procedures: None  Antimicrobials: Anti-infectives (From admission, onward)   None     Subjective: Seen and examined at bedside and he was watching [place].  Denies any complaints.  No nausea or vomiting.  Denies any lightheadedness or dizziness.  Feels okay.  Objective: Vitals:   06/18/20 1516 06/18/20 1927 06/18/20 2329 06/19/20 0830  BP: 97/64 1$RemoveBe'05/68 96/66 94/65 '[id number]$  Pulse: 84 86 82 73  Resp: $Remo'14 17 18 16  'WOVHn$ Temp: 98.7 F (37.1 C) 98.9 F (37.2 C) 98.9 F (37.2 C) 98.4 F (36.9 C)  TempSrc: Oral  Oral Oral  SpO2: 97% 97% 97% 96%  Weight:      Height:       No  intake or output data in the 24 hours ending 06/19/20 1127 Filed Weights   05/29/20 0500 05/30/20 0450 05/31/20 0452  Weight: 66.8 kg 64.3 kg 65.5 kg   Examination:  Physical Exam:  Constitutional: Disheveled Caucasian male currently in no acute distress laying in bed watching television again Respiratory: Slightly diminished to auscultation bilaterally with no appreciable wheezing, rales, rhonchi.  Unlabored breathing and not wearing any supplemental oxygen via nasal cannula Cardiovascular: Regular rate and rhythm.  No appreciable murmurs, rubs, gallops. Abdomen: Soft, nontender, nondistended.  Bowel sounds present GU: Deferred Neurologic: Cranial nerves II through XII grossly intact no appreciable focal deficits  Data Reviewed: I have personally reviewed following labs and imaging studies  CBC: No results for input(s): WBC, NEUTROABS, HGB, HCT, MCV, PLT in the last 168 hours. Basic Metabolic Panel: No results for input(s): NA, K, CL, CO2, GLUCOSE, BUN, CREATININE, CALCIUM, MG, PHOS in the last 168 hours. GFR: Estimated Creatinine Clearance: 83.5 mL/min (by C-G formula based on SCr of 0.98 mg/dL). Liver Function Tests: No results for input(s): AST, ALT, ALKPHOS, BILITOT, PROT, ALBUMIN in the last 168 hours. No results for input(s): LIPASE, AMYLASE in the last 168 hours. No results for input(s): AMMONIA in the last 168 hours. Coagulation Profile: No results for input(s): INR, PROTIME in the last 168 hours. Cardiac Enzymes: No results for input(s): CKTOTAL, CKMB, CKMBINDEX,  TROPONINI in the last 168 hours. BNP (last 3 results) No results for input(s): PROBNP in the last 8760 hours. HbA1C: No results for input(s): HGBA1C in the last 72 hours. CBG: No results for input(s): GLUCAP in the last 168 hours. Lipid Profile: No results for input(s): CHOL, HDL, LDLCALC, TRIG, CHOLHDL, LDLDIRECT in the last 72 hours. Thyroid Function Tests: No results for input(s): TSH, T4TOTAL, FREET4,  T3FREE, THYROIDAB in the last 72 hours. Anemia Panel: No results for input(s): VITAMINB12, FOLATE, FERRITIN, TIBC, IRON, RETICCTPCT in the last 72 hours. Sepsis Labs: No results for input(s): PROCALCITON, LATICACIDVEN in the last 168 hours.  No results found for this or any previous visit (from the past 240 hour(s)).   RN Pressure Injury Documentation:     Estimated body mass index is 21.31 kg/m as calculated from the following:   Height as of this encounter: $RemoveBeforeD'5\' 9"'[id number]$  (1.753 m).   Weight as of this encounter: 65.5 kg.  Malnutrition Type:      Malnutrition Characteristics:      Nutrition Interventions:    Radiology Studies: No results found.  Scheduled Meds: . cholecalciferol  1,000 Units Oral Daily  . clonazePAM  0.5 mg Oral BID  . enoxaparin (LOVENOX) injection  40 mg Subcutaneous Q24H  . haloperidol  0.5 mg Oral BID  . levETIRAcetam  1,500 mg Oral BID  . melatonin  5 mg Oral QHS  . OLANZapine  5 mg Oral QHS  . vitamin B-6  50 mg Oral Daily  . sodium chloride flush  10 mL Intravenous Q12H  . thiamine  100 mg Oral Daily  . vitamin B-12  1,000 mcg Oral Daily   Continuous Infusions: . lamoTRIgine (LAMICTAL) tablet 175 mg      LOS: 67 days   Kerney Elbe, DO [facility] PAGER is on AMION  If 7PM-7AM, please contact night-coverage [email or web address]

## 2020-06-20 NOTE — Progress Notes (Signed)
PROGRESS NOTE    Lance Ray  WCB:[id number] DOB: 1971-04-18 DOA: 05/07/2020 PCP: Patient, No Pcp Per   Brief Narrative:  The patient is a disheveled 49 year old Caucasian male with a past medical history significant for but not limited to dementia, agitation, and history of seizure disorder.  He was initially admitted on 03/24/2020 under hospitalist service for generalized tonic-clonic seizures due to noncompliance with Depakote.  He was loaded at that time with IV lorazepam and Keppra and admitted to the PCU.  He had a negative MRI brain and head CT and his hospital course was complicated by psychosis and agitation and required Precedex drip for agitation.  Psychiatry was consulted ultimately he was discharged to behavioral health on 04/09/2020 and he was IVC.  Hospitalist team was reconsulted by behavioral health after sitter heard the patient screaming witnessed a generalized clonic seizure that lasted about 2 minutes.  Subsequently given 2 mg of IV Ativan and the seizure subsided.  Neurology was also consulted at that time.  Patient was transferred back to the hospital service and currently does not have the capacity make decisions for himself as deemed by psychiatry.  Currently the patient is awaiting legal guardianship prior to discharge and this cannot be done until next month as he is a legal guardianship hearing in October.  Currently he remains medically stable without signs of psychosis does not have a safe discharge disposition as he cannot make decisions for himself and currently does not have a legal guardian to make decisions for him.  He will remain hospitalized until his legal guardianship hearing eventually on October 19th as he needs placement in a group home.  On 06/07/2020 he was a little hypotensive and was given 1 L normal saline bolus with improvement.  Blood pressures have been relatively soft the last week or so but remained stable and he has no active complaints.  Still  awaiting legal guardianship hearing.  Assessment & Plan:   Principal Problem:   Seizure ([place]) Active Problems:   Psychosis ([place])   Hypotension  Breakthrough seizures, likely secondary to AEDs noncompliance- stable. -AEDs adjusted by neurology -Currently on Lamotrigine 175 mg twice daily, Clonazepam 0.5 mg daily, Levitracetam 1500 mg twice daily. -Continue seizure precautions -C/w IV Lorazepam as needed for active seizures greater than 3 minutes as recommended by neurology -Neurology was consulted -Would benefit from home health RN to assist with his medications.  -TOC has been consulted to assist with home health services and guardianship.  -Need for guardianship is barrier to discharge, hearing pending next Month on October 19th 2021.  Vitamin D Deficiency -Last Vitamin D Level noted to be 17.26 -Started on Cholecalciferol 1000 IU daily.   -Recheck vitamin D level in about 3 months.  Seizure Disorder -Management per above -Checked Lamictal and Keppra levels and last Check on 8/29 showed a Lamotrigine Level of 5.6 and a Levetiracetam Level of 64.6 -Continue current regimen of keppra and lamictal.  Leukocytosis with Eosinophilia  -Not present on admission, mild.  Resolved.   -Unclear etiology and ? Reactive.  No clinical suspicion or evidence of infection.   -New on 8/25, was normal previously, WBC 11.4>>11.3>>12.2>>12.6 >>9.3; Now WBC is normalized and 6.3 on last check (06/09/20) -Differential includes allergic reaction, infections (especially parasitic), drug reaction, autoimmune conditions (sarcoidosis, rheumatoid arthritis, inflammatory bowel disease, IgG4 disease), inflammatory condition or some malignancies can also cause.   -He is without respiratory symptoms, skin findings, organomegaly or lymphadenopathy on exam. IgG4 is within normal limits. -CXR  on 8/27 showed low lung volumes, with right basilar atelectasis and bandlike opacity in the left midlung, atelectasis  versus scarring.  Mediastinal contours were normal without hilar lymphadenopathy. -Last Blood Cx showed NGTD at 5 Days & ACE level was 25 -ESR was 11 -Repeat CBC in the AM   Agitation suspect related to alcohol withdrawal-resolved. -Admitted from behavioral health -No evidence of alcohol withdrawal at the time of this visit. -Continue psych medications with Olanzapine 5 mg po qHS and Haldol 2 mg po BID, multivitamins, thiamine and folic acid.  -Seen by psych no need for inpatient psych -Agitation has improved  Normocytic Anemia -Chronic and appears stable -Hgb/Hct is now 11.7/35.2 on last check 06/09/20 -Continue to Monitor for S/Sx of Bleeding; Currently no overt bleeding noted -Repeat CBC intermittently and will repeat in the AM   Hypotension -Mild with Soft Blood pressures -Given 1 Liter NS bolus on 06/07/20 -BP Last check was 99/69 -Continue to Monitor BP per Protocol.   DVT prophylaxis: Enoxaparin 40 mg sq q24h Code Status: FULL CODE Family Communication:  No family present at bedside  Disposition Plan: Pending Guardianship hearing  Status is: Inpatient  Remains inpatient appropriate because:Unsafe d/c plan   Dispo:  Patient From:  Home  Planned Disposition:  Group Home  Expected discharge date:  07/14/20  Medically stable for discharge:  YES   Consultants:   Neurology  Psychiatry    Procedures: None  Antimicrobials: Anti-infectives (From admission, onward)   None     Subjective: Seen and examined at bedside he had no complaints.  Denied any chest pain, lightheadedness or dizziness.  No nausea or vomiting.  Feels okay.  No other concerns or complaints at this time.  Objective: Vitals:   06/19/20 1939 06/19/20 2353 06/20/20 0417 06/20/20 0825  BP: 105/65 99/61 103/68 104/73  Pulse: 84 84 69 75  Resp: _0 Temp: 98.4 F (36.9 C) 98.7 F (37.1 C) 97.7 F (36.5 C) (!) 97.5 F (36.4 C)  TempSrc: Oral Oral Oral Oral  SpO2: 96% 96% 97% 98%    Weight:      Height:       No intake or output data in the 24 hours ending 06/20/20 1209 Filed Weights   05/29/20 0500 05/30/20 0450 05/31/20 0452  Weight: 66.8 kg 64.3 kg 65.5 kg   Examination:  Physical Exam:  Constitutional: Patient is a thin disheveled Caucasian male currently in no acute distress laying in bed watching television Respiratory: Mildly diminished with no wheezing, rales, rhonchi.  Unlabored breathing and is not dyspneic Cardiovascular: Regular rate and rhythm.  No appreciable murmurs, rubs, gallops Abdomen: Soft, nontender, nondistended.  Bowel sounds present GU: Deferred Neurologic: Cranial nerves II through XII grossly intact no appreciable focal deficits  Data Reviewed: I have personally reviewed following labs and imaging studies  CBC: No results for input(s): WBC, NEUTROABS, HGB, HCT, MCV, PLT in the last 168 hours. Basic Metabolic Panel: No results for input(s): NA, K, CL, CO2, GLUCOSE, BUN, CREATININE, CALCIUM, MG, PHOS in the last 168 hours. GFR: Estimated Creatinine Clearance: 83.5 mL/min (by C-G formula based on SCr of 0.98 mg/dL). Liver Function Tests: No results for input(s): AST, ALT, ALKPHOS, BILITOT, PROT, ALBUMIN in the last 168 hours. No results for input(s): LIPASE, AMYLASE in the last 168 hours. No results for input(s): AMMONIA in the last 168 hours. Coagulation Profile: No results for input(s): INR, PROTIME in the last 168 hours. Cardiac Enzymes: No results for input(s): CKTOTAL, CKMB,  CKMBINDEX, TROPONINI in the last 168 hours. BNP (last 3 results) No results for input(s): PROBNP in the last 8760 hours. HbA1C: No results for input(s): HGBA1C in the last 72 hours. CBG: No results for input(s): GLUCAP in the last 168 hours. Lipid Profile: No results for input(s): CHOL, HDL, LDLCALC, TRIG, CHOLHDL, LDLDIRECT in the last 72 hours. Thyroid Function Tests: No results for input(s): TSH, T4TOTAL, FREET4, T3FREE, THYROIDAB in the last 72  hours. Anemia Panel: No results for input(s): VITAMINB12, FOLATE, FERRITIN, TIBC, IRON, RETICCTPCT in the last 72 hours. Sepsis Labs: No results for input(s): PROCALCITON, LATICACIDVEN in the last 168 hours.  No results found for this or any previous visit (from the past 240 hour(s)).   RN Pressure Injury Documentation:     Estimated body mass index is 21.31 kg/m as calculated from the following:   Height as of this encounter: _0  (1.753 m).   Weight as of this encounter: 65.5 kg.  Malnutrition Type:      Malnutrition Characteristics:      Nutrition Interventions:    Radiology Studies: No results found.  Scheduled Meds: . cholecalciferol  1,000 Units Oral Daily  . clonazePAM  0.5 mg Oral BID  . enoxaparin (LOVENOX) injection  40 mg Subcutaneous Q24H  . haloperidol  0.5 mg Oral BID  . levETIRAcetam  1,500 mg Oral BID  . melatonin  5 mg Oral QHS  . OLANZapine  5 mg Oral QHS  . vitamin B-6  50 mg Oral Daily  . sodium chloride flush  10 mL Intravenous Q12H  . thiamine  100 mg Oral Daily  . vitamin B-12  1,000 mcg Oral Daily   Continuous Infusions: . lamoTRIgine (LAMICTAL) tablet 175 mg      LOS: 109 days   Kerney Elbe, DO [facility] PAGER is on AMION  If 7PM-7AM, please contact night-coverage [email or web address]

## 2020-06-21 MED ORDER — LAMOTRIGINE 25 MG PO TABS
175.0000 mg | ORAL_TABLET | Freq: Two times a day (BID) | ORAL | Status: DC
  Administered 2020-06-21 – 2020-06-22 (×2): 175 mg via ORAL
  Filled 2020-06-21 (×6): qty 1

## 2020-06-21 NOTE — Plan of Care (Signed)
  Problem: Activity: Goal: Risk for activity intolerance will decrease Outcome: Progressing   Problem: Education: Goal: Expressions of having a comfortable level of knowledge regarding the disease process will increase Outcome: Progressing   Problem: Coping: Goal: Ability to adjust to condition or change in health will improve Outcome: Progressing Goal: Ability to identify appropriate support needs will improve Outcome: Progressing   Problem: Health Behavior/Discharge Planning: Goal: Compliance with prescribed medication regimen will improve Outcome: Progressing   Problem: Safety: Goal: Verbalization of understanding the information provided will improve Outcome: Progressing   Problem: Self-Concept: Goal: Ability to verbalize feelings about condition will improve Outcome: Progressing

## 2020-06-21 NOTE — Progress Notes (Signed)
PROGRESS NOTE    GOVERNOR MATOS  WCB:[id number] DOB: 1971-04-18 DOA: 05/07/2020 PCP: Patient, No Pcp Per   Brief Narrative:  The patient is a disheveled 49 year old Caucasian male with a past medical history significant for but not limited to dementia, agitation, and history of seizure disorder.  He was initially admitted on 03/24/2020 under hospitalist service for generalized tonic-clonic seizures due to noncompliance with Depakote.  He was loaded at that time with IV lorazepam and Keppra and admitted to the PCU.  He had a negative MRI brain and head CT and his hospital course was complicated by psychosis and agitation and required Precedex drip for agitation.  Psychiatry was consulted ultimately he was discharged to behavioral health on 04/09/2020 and he was IVC.  Hospitalist team was reconsulted by behavioral health after sitter heard the patient screaming witnessed a generalized clonic seizure that lasted about 2 minutes.  Subsequently given 2 mg of IV Ativan and the seizure subsided.  Neurology was also consulted at that time.  Patient was transferred back to the hospital service and currently does not have the capacity make decisions for himself as deemed by psychiatry.  Currently the patient is awaiting legal guardianship prior to discharge and this cannot be done until next month as he is a legal guardianship hearing in October.  Currently he remains medically stable without signs of psychosis does not have a safe discharge disposition as he cannot make decisions for himself and currently does not have a legal guardian to make decisions for him.  He will remain hospitalized until his legal guardianship hearing eventually on October 19th as he needs placement in a group home.  On 06/07/2020 he was a little hypotensive and was given 1 L normal saline bolus with improvement.  Blood pressures have been relatively soft the last week or so but remained stable and he has no active complaints.  Still  awaiting legal guardianship hearing.  Assessment & Plan:   Principal Problem:   Seizure ([place]) Active Problems:   Psychosis ([place])   Hypotension  Breakthrough seizures, likely secondary to AEDs noncompliance- stable. -AEDs adjusted by neurology -Currently on Lamotrigine 175 mg twice daily, Clonazepam 0.5 mg daily, Levitracetam 1500 mg twice daily. -Continue seizure precautions -C/w IV Lorazepam as needed for active seizures greater than 3 minutes as recommended by neurology -Neurology was consulted -Would benefit from home health RN to assist with his medications.  -TOC has been consulted to assist with home health services and guardianship.  -Need for guardianship is barrier to discharge, hearing pending next Month on October 19th 2021.  Vitamin D Deficiency -Last Vitamin D Level noted to be 17.26 -Started on Cholecalciferol 1000 IU daily.   -Recheck vitamin D level in about 3 months.  Seizure Disorder -Management per above -Checked Lamictal and Keppra levels and last Check on 8/29 showed a Lamotrigine Level of 5.6 and a Levetiracetam Level of 64.6 -Continue current regimen of keppra and lamictal.  Leukocytosis with Eosinophilia  -Not present on admission, mild.  Resolved.   -Unclear etiology and ? Reactive.  No clinical suspicion or evidence of infection.   -New on 8/25, was normal previously, WBC 11.4>>11.3>>12.2>>12.6 >>9.3; Now WBC is normalized and 6.3 on last check (06/09/20) -Differential includes allergic reaction, infections (especially parasitic), drug reaction, autoimmune conditions (sarcoidosis, rheumatoid arthritis, inflammatory bowel disease, IgG4 disease), inflammatory condition or some malignancies can also cause.   -He is without respiratory symptoms, skin findings, organomegaly or lymphadenopathy on exam. IgG4 is within normal limits. -CXR  on 8/27 showed low lung volumes, with right basilar atelectasis and bandlike opacity in the left midlung, atelectasis  versus scarring.  Mediastinal contours were normal without hilar lymphadenopathy. -Last Blood Cx showed NGTD at 5 Days & ACE level was 25 -ESR was 11 -Repeat CBC in the AM   Agitation suspect related to alcohol withdrawal-resolved. -Admitted from behavioral health -No evidence of alcohol withdrawal at the time of this visit. -Continue psych medications with Olanzapine 5 mg po qHS and Haldol 2 mg po BID, multivitamins, thiamine and folic acid.  -Seen by psych no need for inpatient psych -Agitation has improved  Normocytic Anemia -Chronic and appears stable -Hgb/Hct is now 11.7/35.2 on last check 06/09/20 -Continue to Monitor for S/Sx of Bleeding; Currently no overt bleeding noted -Repeat CBC intermittently and will repeat in the AM   Hypotension -Mild with Soft Blood pressures -Given 1 Liter NS bolus on 06/07/20 -BP Last check was 99/69 -Continue to Monitor BP per Protocol.   DVT prophylaxis: Enoxaparin 40 mg sq q24h Code Status: FULL CODE Family Communication:  No family present at bedside  Disposition Plan: Pending Guardianship hearing  Status is: Inpatient  Remains inpatient appropriate because:Unsafe d/c plan   Dispo:  Patient From:  Home  Planned Disposition:  Group Home  Expected discharge date: 07/13/2109/20/21  Medically stable for discharge: YesYES   Consultants:   Neurology  Psychiatry    Procedures: None  Antimicrobials: Anti-infectives (From admission, onward)   None     Subjective: Seen and examined at bedside and he is feeling well.  Wanting to rest.  No chest pain, lightheadedness or dizziness.  No other concerns or complaints at this time.  Objective: Vitals:   06/20/20 2334 06/21/20 0442 06/21/20 0734 06/21/20 1100  BP: 101/69 1$RemoveBef'01/64 96/63 99/69 '[id number]$  Pulse: 84 70 67 71  Resp: $Remo'17 18 16 16  'WlVBv$ Temp: 98.2 F (36.8 C) 97.7 F (36.5 C) 98.6 F (37 C) 97.6 F (36.4 C)  TempSrc: Oral Oral    SpO2: 97% 98% 97% 98%  Weight:      Height:        No intake or output data in the 24 hours ending 06/21/20 1141 Filed Weights   05/29/20 0500 05/30/20 0450 05/31/20 0452  Weight: 66.8 kg 64.3 kg 65.5 kg   Examination:  Physical Exam:  Constitutional: Patient is a thin disheveled Caucasian male currently no acute distress laying in bed and was seeing a little sleepy. Respiratory: Slightly diminished with no appreciable wheezing, rales, rhonchi.  Unlabored breathing and he is not dyspneic or using any supplemental oxygen via nasal cannula Cardiovascular: Regular rate and rhythm.  No appreciable murmurs, rubs, gallops. Abdomen: Soft, nontender, nondistended.  Bowel sounds present GU: Deferred Neurologic: Cranial nerves II through XII gross intact no appreciable focal deficits.  Data Reviewed: I have personally reviewed following labs and imaging studies  CBC: No results for input(s): WBC, NEUTROABS, HGB, HCT, MCV, PLT in the last 168 hours. Basic Metabolic Panel: No results for input(s): NA, K, CL, CO2, GLUCOSE, BUN, CREATININE, CALCIUM, MG, PHOS in the last 168 hours. GFR: Estimated Creatinine Clearance: 83.5 mL/min (by C-G formula based on SCr of 0.98 mg/dL). Liver Function Tests: No results for input(s): AST, ALT, ALKPHOS, BILITOT, PROT, ALBUMIN in the last 168 hours. No results for input(s): LIPASE, AMYLASE in the last 168 hours. No results for input(s): AMMONIA in the last 168 hours. Coagulation Profile: No results for input(s): INR, PROTIME in the last 168 hours. Cardiac Enzymes: No  results for input(s): CKTOTAL, CKMB, CKMBINDEX, TROPONINI in the last 168 hours. BNP (last 3 results) No results for input(s): PROBNP in the last 8760 hours. HbA1C: No results for input(s): HGBA1C in the last 72 hours. CBG: No results for input(s): GLUCAP in the last 168 hours. Lipid Profile: No results for input(s): CHOL, HDL, LDLCALC, TRIG, CHOLHDL, LDLDIRECT in the last 72 hours. Thyroid Function Tests: No results for input(s): TSH,  T4TOTAL, FREET4, T3FREE, THYROIDAB in the last 72 hours. Anemia Panel: No results for input(s): VITAMINB12, FOLATE, FERRITIN, TIBC, IRON, RETICCTPCT in the last 72 hours. Sepsis Labs: No results for input(s): PROCALCITON, LATICACIDVEN in the last 168 hours.  No results found for this or any previous visit (from the past 240 hour(s)).   RN Pressure Injury Documentation:     Estimated body mass index is 21.31 kg/m as calculated from the following:   Height as of this encounter: $RemoveBeforeD'5\' 9"'[id number]$  (1.753 m).   Weight as of this encounter: 65.5 kg.  Malnutrition Type:      Malnutrition Characteristics:      Nutrition Interventions:    Radiology Studies: No results found.  Scheduled Meds: . cholecalciferol  1,000 Units Oral Daily  . clonazePAM  0.5 mg Oral BID  . enoxaparin (LOVENOX) injection  40 mg Subcutaneous Q24H  . haloperidol  0.5 mg Oral BID  . levETIRAcetam  1,500 mg Oral BID  . melatonin  5 mg Oral QHS  . OLANZapine  5 mg Oral QHS  . vitamin B-6  50 mg Oral Daily  . sodium chloride flush  10 mL Intravenous Q12H  . thiamine  100 mg Oral Daily  . vitamin B-12  1,000 mcg Oral Daily   Continuous Infusions: . lamoTRIgine (LAMICTAL) tablet 175 mg      LOS: 54 days   Kerney Elbe, DO [facility] PAGER is on AMION  If 7PM-7AM, please contact night-coverage [email or web address]

## 2020-06-22 NOTE — Progress Notes (Signed)
PROGRESS NOTE    Ronith L Thackston  MRN:[id number] DOB: 01/29/1971 DOA: 05/07/2020 PCP: Patient, No Pcp Per   Brief Narrative:  The patient is a disheveled 49-year-old Caucasian male with a past medical history significant for but not limited to dementia, agitation, and history of seizure disorder.  He was initially admitted on 03/24/2020 under hospitalist service for generalized tonic-clonic seizures due to noncompliance with Depakote.  He was loaded at that time with IV lorazepam and Keppra and admitted to the PCU.  He had a negative MRI brain and head CT and his hospital course was complicated by psychosis and agitation and required Precedex drip for agitation.  Psychiatry was consulted ultimately he was discharged to behavioral health on 04/09/2020 and he was IVC.  Hospitalist team was reconsulted by behavioral health after sitter heard the patient screaming witnessed a generalized clonic seizure that lasted about 2 minutes.  Subsequently given 2 mg of IV Ativan and the seizure subsided.  Neurology was also consulted at that time.  Patient was transferred back to the hospital service and currently does not have the capacity make decisions for himself as deemed by psychiatry.  Currently the patient is awaiting legal guardianship prior to discharge and this cannot be done until next month as he is a legal guardianship hearing in October.  Currently he remains medically stable without signs of psychosis does not have a safe discharge disposition as he cannot make decisions for himself and currently does not have a legal guardian to make decisions for him.  He will remain hospitalized until his legal guardianship hearing eventually on October 19th as he needs placement in a group home.  On 06/07/2020 he was a little hypotensive and was given 1 L normal saline bolus with improvement.  Blood pressures have been relatively soft the last week or so but remained stable and he has no active complaints.  Still  awaiting legal guardianship hearing. Today (06/22/20) his BP was a little lower but was asymptomatic so will continue to Monitor closely.   Assessment & Plan:   Principal Problem:   Seizure (HCC) Active Problems:   Psychosis (HCC)   Hypotension  Breakthrough seizures, likely secondary to AEDs noncompliance- stable. -AEDs adjusted by neurology -Currently on Lamotrigine 175 mg twice daily, Clonazepam 0.5 mg daily, Levitracetam 1500 mg twice daily. -Continue seizure precautions -C/w IV Lorazepam as needed for active seizures greater than 3 minutes as recommended by neurology -Neurology was consulted -Would benefit from home health RN to assist with his medications.  -TOC has been consulted to assist with home health services and guardianship.  -Need for guardianship is barrier to discharge, hearing pending next Month on October 19th 2021.  Vitamin D Deficiency -Last Vitamin D Level noted to be 17.26 -Started on Cholecalciferol 1000 IU daily.   -Recheck vitamin D level in about 3 months.  Seizure Disorder -Management per above -Checked Lamictal and Keppra levels and last Check on 8/29 showed a Lamotrigine Level of 5.6 and a Levetiracetam Level of 64.6 -Continue current regimen of keppra and lamictal.  Leukocytosis with Eosinophilia  -Not present on admission, mild.  Resolved.   -Unclear etiology and ? Reactive.  No clinical suspicion or evidence of infection.   -New on 8/25, was normal previously, WBC 11.4>>11.3>>12.2>>12.6 >>9.3; Now WBC is normalized and 6.3 on last check (06/09/20) -Differential includes allergic reaction, infections (especially parasitic), drug reaction, autoimmune conditions (sarcoidosis, rheumatoid arthritis, inflammatory bowel disease, IgG4 disease), inflammatory condition or some malignancies can also cause.   -  He is without respiratory symptoms, skin findings, organomegaly or lymphadenopathy on exam. IgG4 is within normal limits. -CXR on 8/27 showed low  lung volumes, with right basilar atelectasis and bandlike opacity in the left midlung, atelectasis versus scarring.  Mediastinal contours were normal without hilar lymphadenopathy. -Last Blood Cx showed NGTD at 5 Days & ACE level was 25 -ESR was 11 -Repeat CBC in the AM   Agitation suspect related to alcohol withdrawal-resolved. -Admitted from behavioral health -No evidence of alcohol withdrawal at the time of this visit. -Continue psych medications with Olanzapine 5 mg po qHS and Haldol 2 mg po BID, multivitamins, thiamine and folic acid.  -Seen by psych no need for inpatient psych -Agitation has improved  Normocytic Anemia -Chronic and appears stable -Hgb/Hct is now 11.7/35.2 on last check 06/09/20 -Continue to Monitor for S/Sx of Bleeding; Currently no overt bleeding noted -Repeat CBC intermittently and will repeat in the AM   Hypotension -Mild with Soft Blood pressures -Given 1 Liter NS bolus on 06/07/20 -BP Last check was 99/69 -Continue to Monitor BP per Protocol.   DVT prophylaxis: Enoxaparin 40 mg sq q24h Code Status: FULL CODE Family Communication:  No family present at bedside  Disposition Plan: Pending Guardianship hearing  Status is: Inpatient  Remains inpatient appropriate because:Unsafe d/c plan   Dispo:  Patient From:  Home  Planned Disposition:  Group Home  Expected discharge date: 07/13/2109/20/21  Medically stable for discharge: YesYES   Consultants:   Neurology  Psychiatry    Procedures: None  Antimicrobials: Anti-infectives (From admission, onward)   None     Subjective: Seen and examined at bedside and he was resting and wanting to sleep. States he had an ok night. No Chest Pain or SOB. No other concerns or complaints at this time.   Objective: Vitals:   06/21/20 1549 06/21/20 2001 06/22/20 0048 06/22/20 0741  BP: 108/69 110/65 99/69 (!) 89/70  Pulse: 80 90 82 64  Resp: 16 18 18 15  Temp: 97.6 F (36.4 C) 98.6 F (37 C) 98.1 F  (36.7 C) 98 F (36.7 C)  TempSrc:  Oral Oral Oral  SpO2: 98% 97% 96% 96%  Weight:      Height:        Intake/Output Summary (Last 24 hours) at 06/22/2020 1113 Last data filed at 06/22/2020 1020 Gross per 24 hour  Intake 10 ml  Output --  Net 10 ml   Filed Weights   05/29/20 0500 05/30/20 0450 05/31/20 0452  Weight: 66.8 kg 64.3 kg 65.5 kg   Examination:  Physical Exam:  Constitutional: The patient is a thin disheveled Caucasian male in NAD appears calm and resting and awoken from sleep Respiratory: Slightly diminished but unlabored breathing with no wheezing, rales, rhonchi Cardiovascular: RRR Abdomen: Soft, NT, ND. Bowel sounds present GU: Deferred Neurologic: CN 2-12 grossly intact. No appreciable focal deficits.  Data Reviewed: I have personally reviewed following labs and imaging studies  CBC: No results for input(s): WBC, NEUTROABS, HGB, HCT, MCV, PLT in the last 168 hours. Basic Metabolic Panel: No results for input(s): NA, K, CL, CO2, GLUCOSE, BUN, CREATININE, CALCIUM, MG, PHOS in the last 168 hours. GFR: Estimated Creatinine Clearance: 83.5 mL/min (by C-G formula based on SCr of 0.98 mg/dL). Liver Function Tests: No results for input(s): AST, ALT, ALKPHOS, BILITOT, PROT, ALBUMIN in the last 168 hours. No results for input(s): LIPASE, AMYLASE in the last 168 hours. No results for input(s): AMMONIA in the last 168 hours. Coagulation Profile: No   results for input(s): INR, PROTIME in the last 168 hours. Cardiac Enzymes: No results for input(s): CKTOTAL, CKMB, CKMBINDEX, TROPONINI in the last 168 hours. BNP (last 3 results) No results for input(s): PROBNP in the last 8760 hours. HbA1C: No results for input(s): HGBA1C in the last 72 hours. CBG: No results for input(s): GLUCAP in the last 168 hours. Lipid Profile: No results for input(s): CHOL, HDL, LDLCALC, TRIG, CHOLHDL, LDLDIRECT in the last 72 hours. Thyroid Function Tests: No results for input(s): TSH,  T4TOTAL, FREET4, T3FREE, THYROIDAB in the last 72 hours. Anemia Panel: No results for input(s): VITAMINB12, FOLATE, FERRITIN, TIBC, IRON, RETICCTPCT in the last 72 hours. Sepsis Labs: No results for input(s): PROCALCITON, LATICACIDVEN in the last 168 hours.  No results found for this or any previous visit (from the past 240 hour(s)).   RN Pressure Injury Documentation:     Estimated body mass index is 21.31 kg/m as calculated from the following:   Height as of this encounter: 5' 9" (1.753 m).   Weight as of this encounter: 65.5 kg.  Malnutrition Type:      Malnutrition Characteristics:      Nutrition Interventions:    Radiology Studies: No results found.  Scheduled Meds: . cholecalciferol  1,000 Units Oral Daily  . clonazePAM  0.5 mg Oral BID  . enoxaparin (LOVENOX) injection  40 mg Subcutaneous Q24H  . haloperidol  0.5 mg Oral BID  . levETIRAcetam  1,500 mg Oral BID  . melatonin  5 mg Oral QHS  . OLANZapine  5 mg Oral QHS  . vitamin B-6  50 mg Oral Daily  . sodium chloride flush  10 mL Intravenous Q12H  . thiamine  100 mg Oral Daily  . vitamin B-12  1,000 mcg Oral Daily   Continuous Infusions: . lamoTRIgine (LAMICTAL) tablet 175 mg      LOS: 57 days   Kerney Elbe, DO [facility] PAGER is on AMION  If 7PM-7AM, please contact night-coverage [email or web address]

## 2020-06-23 DIAGNOSIS — E559 Vitamin D deficiency, unspecified: Secondary | ICD-10-CM

## 2020-06-23 MED ORDER — LAMOTRIGINE 25 MG PO TABS
75.0000 mg | ORAL_TABLET | Freq: Two times a day (BID) | ORAL | Status: DC
  Administered 2020-06-23 – 2020-08-12 (×99): 75 mg via ORAL
  Filled 2020-06-23 (×81): qty 3

## 2020-06-23 MED ORDER — LAMOTRIGINE 100 MG PO TABS
100.0000 mg | ORAL_TABLET | Freq: Two times a day (BID) | ORAL | Status: DC
  Administered 2020-06-23 – 2020-08-12 (×101): 100 mg via ORAL
  Filled 2020-06-23 (×2): qty 1
  Filled 2020-06-23: qty 4
  Filled 2020-06-23 (×2): qty 1
  Filled 2020-06-23: qty 4
  Filled 2020-06-23: qty 1
  Filled 2020-06-23: qty 4
  Filled 2020-06-23: qty 1
  Filled 2020-06-23: qty 4
  Filled 2020-06-23 (×2): qty 1
  Filled 2020-06-23 (×2): qty 4
  Filled 2020-06-23: qty 1
  Filled 2020-06-23: qty 4
  Filled 2020-06-23 (×5): qty 1
  Filled 2020-06-23 (×2): qty 4
  Filled 2020-06-23: qty 1
  Filled 2020-06-23: qty 4
  Filled 2020-06-23: qty 1
  Filled 2020-06-23 (×2): qty 4
  Filled 2020-06-23 (×3): qty 1
  Filled 2020-06-23: qty 4
  Filled 2020-06-23: qty 1
  Filled 2020-06-23: qty 4
  Filled 2020-06-23 (×2): qty 1
  Filled 2020-06-23: qty 4
  Filled 2020-06-23 (×2): qty 1
  Filled 2020-06-23: qty 4
  Filled 2020-06-23 (×2): qty 1
  Filled 2020-06-23 (×2): qty 4
  Filled 2020-06-23 (×3): qty 1
  Filled 2020-06-23: qty 4
  Filled 2020-06-23 (×12): qty 1
  Filled 2020-06-23: qty 4
  Filled 2020-06-23: qty 1
  Filled 2020-06-23: qty 4
  Filled 2020-06-23 (×2): qty 1
  Filled 2020-06-23: qty 4
  Filled 2020-06-23 (×2): qty 1
  Filled 2020-06-23: qty 4
  Filled 2020-06-23: qty 1
  Filled 2020-06-23: qty 4
  Filled 2020-06-23 (×2): qty 1
  Filled 2020-06-23: qty 4
  Filled 2020-06-23 (×5): qty 1
  Filled 2020-06-23: qty 4
  Filled 2020-06-23 (×2): qty 1
  Filled 2020-06-23: qty 4
  Filled 2020-06-23 (×3): qty 1
  Filled 2020-06-23: qty 4
  Filled 2020-06-23: qty 1
  Filled 2020-06-23: qty 4
  Filled 2020-06-23: qty 1
  Filled 2020-06-23 (×3): qty 4
  Filled 2020-06-23 (×5): qty 1
  Filled 2020-06-23: qty 4
  Filled 2020-06-23: qty 1
  Filled 2020-06-23: qty 4
  Filled 2020-06-23 (×7): qty 1
  Filled 2020-06-23 (×4): qty 4
  Filled 2020-06-23 (×2): qty 1
  Filled 2020-06-23 (×2): qty 4
  Filled 2020-06-23: qty 1
  Filled 2020-06-23: qty 4
  Filled 2020-06-23 (×9): qty 1
  Filled 2020-06-23: qty 4
  Filled 2020-06-23 (×2): qty 1
  Filled 2020-06-23: qty 4
  Filled 2020-06-23 (×2): qty 1
  Filled 2020-06-23: qty 4
  Filled 2020-06-23 (×2): qty 1
  Filled 2020-06-23 (×3): qty 4
  Filled 2020-06-23: qty 1
  Filled 2020-06-23: qty 4
  Filled 2020-06-23: qty 1
  Filled 2020-06-23: qty 4
  Filled 2020-06-23 (×3): qty 1
  Filled 2020-06-23: qty 4
  Filled 2020-06-23: qty 1
  Filled 2020-06-23: qty 4
  Filled 2020-06-23 (×2): qty 1
  Filled 2020-06-23 (×2): qty 4
  Filled 2020-06-23: qty 1
  Filled 2020-06-23: qty 4

## 2020-06-23 NOTE — Progress Notes (Signed)
Patient ID: Lance Ray, male   DOB: 1970-11-17, 49 y.o.   MRN: [id number] [facility] PROGRESS NOTE  Lance Ray:[id number] DOB: 1970-11-26 DOA: 05/07/2020 PCP: Patient, No Pcp Per  HPI/Subjective: Patient wants to get out of the hospital soon as possible.  Feels okay.  Offers no complaints.  Still awaiting guardianship.  Objective: Vitals:   06/23/20 0754 06/23/20 1218  BP: 99/67 110/68  Pulse: 64 69  Resp: 16 16  Temp: 98.2 F (36.8 C) 98.2 F (36.8 C)  SpO2: 97% 98%    Intake/Output Summary (Last 24 hours) at 06/23/2020 1519 Last data filed at 06/23/2020 1356 Gross per 24 hour  Intake 250 ml  Output 0 ml  Net 250 ml   Filed Weights   05/29/20 0500 05/30/20 0450 05/31/20 0452  Weight: 66.8 kg 64.3 kg 65.5 kg    ROS: Review of Systems  Respiratory: Negative for cough and shortness of breath.   Cardiovascular: Negative for chest pain.  Gastrointestinal: Negative for abdominal pain, nausea and vomiting.   Exam: Physical Exam HENT:     Head: Normocephalic.     Mouth/Throat:     Pharynx: No oropharyngeal exudate.  Eyes:     General: Lids are normal.     Conjunctiva/sclera: Conjunctivae normal.  Cardiovascular:     Rate and Rhythm: Normal rate and regular rhythm.     Heart sounds: Normal heart sounds, S1 normal and S2 normal.  Pulmonary:     Breath sounds: No decreased breath sounds, wheezing, rhonchi or rales.  Abdominal:     Palpations: Abdomen is soft.     Tenderness: There is no abdominal tenderness.  Musculoskeletal:     Right ankle: No swelling.     Left ankle: No swelling.  Skin:    General: Skin is warm.     Findings: No rash.  Neurological:     Mental Status: He is alert.     Comments: Moves all extremities on his own       Scheduled Meds: . cholecalciferol  1,000 Units Oral Daily  . clonazePAM  0.5 mg Oral BID  . enoxaparin (LOVENOX) injection  40 mg Subcutaneous Q24H  . haloperidol  0.5 mg Oral BID  . lamoTRIgine  100  mg Oral BID   And  . lamoTRIgine  75 mg Oral BID  . levETIRAcetam  1,500 mg Oral BID  . melatonin  5 mg Oral QHS  . OLANZapine  5 mg Oral QHS  . vitamin B-6  50 mg Oral Daily  . sodium chloride flush  10 mL Intravenous Q12H  . thiamine  100 mg Oral Daily  . vitamin B-12  1,000 mcg Oral Daily    Assessment/Plan:  1. Seizure disorder.  Continue lamotrigine, clonazepam and Keppra. 2. Vitamin D deficiency on oral vitamin D  I have reviewed prior laboratory and radiological data.    Code Status:     Code Status Orders  (From admission, onward)         Start     Ordered   05/07/20 2354  Full code  Continuous        05/07/20 2354        Code Status History    Date Active Date Inactive Code Status Order ID Comments User Context   05/07/2020 2323 05/07/2020 2346 Full Code [id number]  Anselm Jungling, DO Inpatient   04/09/2020 1427 05/07/2020 2323 Full Code [id number]  Audery Amel, MD Inpatient   03/24/2020 2314 04/09/2020 1424  Full Code [id number]  Mansy, Vernetta Honey, MD ED   Advance Care Planning Activity     Disposition Plan: Status is: Inpatient  Dispo:  Patient From: Home  Planned Disposition: Unclear at this point  Expected discharge date: Awaiting guardianship  Medically stable for discharge: Yes  Time spent: 26 minutes  Jamiere Gulas [employer]

## 2020-06-24 DIAGNOSIS — R4189 Other symptoms and signs involving cognitive functions and awareness: Secondary | ICD-10-CM

## 2020-06-24 DIAGNOSIS — G3184 Mild cognitive impairment, so stated: Secondary | ICD-10-CM

## 2020-06-24 NOTE — Progress Notes (Signed)
PT Cancellation Note  Patient Details Name: Lance Ray MRN: [id number] DOB: October 20, 1970   Cancelled Treatment:    Reason Eval/Treat Not Completed: PT screened, no needs identified, will sign off. Pt is independent with all bed mobility and sit<>stand transfers. He ambulated 250 feet with supervision without any AD or overt LOB. He walked 10 feet in 3.31 seconds. He still has steppage gait but is consistent with his baseline pattern. Pt does not need any PT services at this time. Please reconsult if pt's status changes.  Katherine Basset, SPT Baker Pierini 06/24/2020, 4:06 PM

## 2020-06-24 NOTE — Progress Notes (Signed)
Patient ID: Lance Ray, male   DOB: 02/11/1971, 49 y.o.   MRN: [id number] [facility] PROGRESS NOTE  Lance Ray [id number]:[id number] DOB: 20-Apr-1971 DOA: 05/07/2020 PCP: Patient, No Pcp Per  HPI/Subjective: Patient feeling okay. Offers no complaints. Awaiting a disposition.  Objective: Vitals:   06/24/20 1157 06/24/20 1527  BP: 103/65 99/62  Pulse: 74 88  Resp: 13 14  Temp: 98.6 F (37 C) 98.6 F (37 C)  SpO2: 97% 97%    Intake/Output Summary (Last 24 hours) at 06/24/2020 1548 Last data filed at 06/24/2020 1300 Gross per 24 hour  Intake 1200 ml  Output 0 ml  Net 1200 ml   Filed Weights   05/29/20 0500 05/30/20 0450 05/31/20 0452  Weight: 66.8 kg 64.3 kg 65.5 kg    ROS: Review of Systems  Respiratory: Negative for cough and shortness of breath.   Cardiovascular: Negative for chest pain.  Gastrointestinal: Negative for heartburn, nausea and vomiting.   Exam: Physical Exam HENT:     Head: Normocephalic.     Mouth/Throat:     Pharynx: No oropharyngeal exudate.  Eyes:     General: Lids are normal.     Conjunctiva/sclera: Conjunctivae normal.     Pupils: Pupils are equal, round, and reactive to light.  Cardiovascular:     Rate and Rhythm: Normal rate and regular rhythm.     Heart sounds: Normal heart sounds, S1 normal and S2 normal.  Pulmonary:     Breath sounds: No decreased breath sounds, wheezing, rhonchi or rales.  Abdominal:     Palpations: Abdomen is soft.     Tenderness: There is no abdominal tenderness.  Musculoskeletal:     Right lower leg: No swelling.     Left lower leg: No swelling.  Skin:    General: Skin is warm.     Findings: No rash.  Neurological:     Mental Status: He is alert.     Comments: Answers all questions appropriately.       Scheduled Meds: . cholecalciferol  1,000 Units Oral Daily  . clonazePAM  0.5 mg Oral BID  . enoxaparin (LOVENOX) injection  40 mg Subcutaneous Q24H  . haloperidol  0.5 mg Oral BID  .  lamoTRIgine  100 mg Oral BID   And  . lamoTRIgine  75 mg Oral BID  . levETIRAcetam  1,500 mg Oral BID  . melatonin  5 mg Oral QHS  . OLANZapine  5 mg Oral QHS  . vitamin B-6  50 mg Oral Daily  . sodium chloride flush  10 mL Intravenous Q12H  . thiamine  100 mg Oral Daily  . vitamin B-12  1,000 mcg Oral Daily   Continuous Infusions:  Assessment/Plan:  1. Seizure disorder. On lamotrigine, Keppra and clonazepam. 2. Vitamin D deficiency. Continue oral vitamin D 3. Awaiting guardianship hearing. As per psychiatry has chronic impairment with judgment and insight and reliability. 4. Ambulate with physical therapy and/or nursing staff.   Code Status:     Code Status Orders  (From admission, onward)         Start     Ordered   05/07/20 2354  Full code  Continuous        05/07/20 2354        Code Status History    Date Active Date Inactive Code Status Order ID Comments User Context   05/07/2020 2323 05/07/2020 2346 Full Code [id number]  Anselm Jungling, DO Inpatient   04/09/2020 1427 05/07/2020 2323 Full  Code [id number]  Audery Amel, MD Inpatient   03/24/2020 2314 04/09/2020 1424 Full Code [id number]  Mansy, Vernetta Honey, MD ED   Advance Care Planning Activity     Disposition Plan: Status is: Inpatient  Dispo:  Patient From: Home  Planned Disposition: To facility  Expected discharge date: 07/13/20  Medically stable for discharge: Yes  Time spent: 24 minutes  Catheline Hixon [employer]

## 2020-06-24 NOTE — Plan of Care (Signed)
  Problem: Self-Concept: Goal: Ability to verbalize feelings about condition will improve Outcome: Not Progressing

## 2020-06-25 NOTE — Progress Notes (Signed)
Patient ID: Lance Ray, male   DOB: 04/21/1971, 49 y.o.   MRN: [id number] [facility] PROGRESS NOTE  Lance Ray [id number] DOB: 1971-01-26 DOA: 05/07/2020 PCP: Patient, No Pcp Per  HPI/Subjective: Admitted with seizures.  Offers no complaints.  No further seizure.  Objective: Vitals:   06/25/20 1148 06/25/20 1516  BP: 109/68 106/72  Pulse: 74 78  Resp: 18 17  Temp: 98.8 F (37.1 C) 97.6 F (36.4 C)  SpO2: 97% 98%    Intake/Output Summary (Last 24 hours) at 06/25/2020 1820 Last data filed at 06/25/2020 1014 Gross per 24 hour  Intake 120 ml  Output --  Net 120 ml   Filed Weights   05/29/20 0500 05/30/20 0450 05/31/20 0452  Weight: 66.8 kg 64.3 kg 65.5 kg    ROS: Review of Systems  Respiratory: Negative for cough and shortness of breath.   Cardiovascular: Negative for chest pain.  Gastrointestinal: Negative for abdominal pain, nausea and vomiting.   Exam: Physical Exam HENT:     Head: Normocephalic.  Eyes:     General: Lids are normal.     Conjunctiva/sclera: Conjunctivae normal.     Pupils: Pupils are equal, round, and reactive to light.  Cardiovascular:     Rate and Rhythm: Normal rate and regular rhythm.     Heart sounds: Normal heart sounds, S1 normal and S2 normal.  Pulmonary:     Breath sounds: No decreased breath sounds, wheezing, rhonchi or rales.  Abdominal:     Palpations: Abdomen is soft.     Tenderness: There is no abdominal tenderness.  Musculoskeletal:     Right lower leg: No swelling.     Left lower leg: No swelling.  Skin:    General: Skin is warm.     Findings: No rash.  Neurological:     Mental Status: He is alert.       Scheduled Meds: . cholecalciferol  1,000 Units Oral Daily  . clonazePAM  0.5 mg Oral BID  . enoxaparin (LOVENOX) injection  40 mg Subcutaneous Q24H  . haloperidol  0.5 mg Oral BID  . lamoTRIgine  100 mg Oral BID   And  . lamoTRIgine  75 mg Oral BID  . levETIRAcetam  1,500 mg Oral BID  .  melatonin  5 mg Oral QHS  . OLANZapine  5 mg Oral QHS  . vitamin B-6  50 mg Oral Daily  . sodium chloride flush  10 mL Intravenous Q12H  . thiamine  100 mg Oral Daily  . vitamin B-12  1,000 mcg Oral Daily    Assessment/Plan:  1. Seizure disorder.  Continue Keppra, lamotrigine and clonazepam. 2. Vitamin D deficiency.  On oral vitamin D. 3. Awaiting guardianship hearing.  Patient has chronic impairment with judgment and insight.      Code Status:     Code Status Orders  (From admission, onward)         Start     Ordered   05/07/20 2354  Full code  Continuous        05/07/20 2354        Code Status History    Date Active Date Inactive Code Status Order ID Comments User Context   05/07/2020 2323 05/07/2020 2346 Full Code [id number]  Anselm Jungling, DO Inpatient   04/09/2020 1427 05/07/2020 2323 Full Code [id number]  Audery Amel, MD Inpatient   03/24/2020 2314 04/09/2020 1424 Full Code [id number]  Mansy, Vernetta Honey, MD ED   Advance Care Planning  Activity     Disposition Plan: Status is: Inpatient  Dispo:  Patient From: Home  Planned Disposition: Unclear at this point  Expected discharge date: 07/13/20  Medically stable for discharge: Yes  Time spent: 22 minutes  Clarance Bollard [employer]

## 2020-06-26 DIAGNOSIS — F1010 Alcohol abuse, uncomplicated: Secondary | ICD-10-CM

## 2020-06-26 NOTE — Progress Notes (Signed)
Patient ID: SILVERIO HAGAN, male   DOB: 23-Jan-1971, 49 y.o.   MRN: [id number] [facility] PROGRESS NOTE  FAIZON CAPOZZI [id number] DOB: January 02, 1971 DOA: 05/07/2020 PCP: Patient, No Pcp Per  HPI/Subjective: Patient feels okay.  Asking about clothes and could his sister bring them in.  Objective: Vitals:   06/26/20 0813 06/26/20 1152  BP: 109/73 105/71  Pulse: (!) 55 69  Resp: 16 16  Temp: 97.7 F (36.5 C) 97.8 F (36.6 C)  SpO2: 97% 97%    Intake/Output Summary (Last 24 hours) at 06/26/2020 1557 Last data filed at 06/26/2020 1426 Gross per 24 hour  Intake 360 ml  Output --  Net 360 ml   Filed Weights   05/29/20 0500 05/30/20 0450 05/31/20 0452  Weight: 66.8 kg 64.3 kg 65.5 kg    ROS: Review of Systems  Respiratory: Negative for cough and shortness of breath.   Cardiovascular: Negative for chest pain.  Gastrointestinal: Negative for abdominal pain, nausea and vomiting.   Exam: Physical Exam HENT:     Mouth/Throat:     Pharynx: No oropharyngeal exudate.  Eyes:     General: Lids are normal.     Conjunctiva/sclera: Conjunctivae normal.  Cardiovascular:     Rate and Rhythm: Normal rate and regular rhythm.     Heart sounds: Normal heart sounds, S1 normal and S2 normal.  Pulmonary:     Breath sounds: Examination of the right-lower field reveals decreased breath sounds. Examination of the left-lower field reveals decreased breath sounds. Decreased breath sounds present. No wheezing, rhonchi or rales.  Abdominal:     Palpations: Abdomen is soft.     Tenderness: There is no abdominal tenderness.  Musculoskeletal:     Right ankle: No swelling.     Left ankle: No swelling.  Skin:    General: Skin is warm.     Findings: No rash.  Neurological:     Mental Status: He is alert.     Comments: Answers some questions appropriately       Scheduled Meds: . cholecalciferol  1,000 Units Oral Daily  . clonazePAM  0.5 mg Oral BID  . enoxaparin (LOVENOX)  injection  40 mg Subcutaneous Q24H  . haloperidol  0.5 mg Oral BID  . lamoTRIgine  100 mg Oral BID   And  . lamoTRIgine  75 mg Oral BID  . levETIRAcetam  1,500 mg Oral BID  . melatonin  5 mg Oral QHS  . OLANZapine  5 mg Oral QHS  . vitamin B-6  50 mg Oral Daily  . sodium chloride flush  10 mL Intravenous Q12H  . thiamine  100 mg Oral Daily  . vitamin B-12  1,000 mcg Oral Daily    Assessment/Plan:  1. Seizure disorder. Continue Keppra, lamotrigine and clonazepam. 2. Vitamin D deficiency on oral vitamin D 3. Alcohol abuse at home. Continue thiamine 4. Awaiting guardianship hearing. Patient has chronic impairment with judgment and insight. Spoke with the patient's sister on the phone and she said Lorita Officer at [employer] is on the case. Hearing on the 19th.       Code Status:     Code Status Orders  (From admission, onward)         Start     Ordered   05/07/20 2354  Full code  Continuous        05/07/20 2354        Code Status History    Date Active Date Inactive Code Status Order ID Comments  User Context   05/07/2020 2323 05/07/2020 2346 Full Code [id number]  Anselm Jungling, DO Inpatient   04/09/2020 1427 05/07/2020 2323 Full Code [id number]  Audery Amel, MD Inpatient   03/24/2020 2314 04/09/2020 1424 Full Code [id number]  Mansy, Vernetta Honey, MD ED   Advance Care Planning Activity     Family Communication: Spoke with sister on the phone Disposition Plan: Status is: Inpatient  Dispo:  Patient From: Home  Planned Disposition: Will need a facility  Expected discharge date: 07/13/20  Medically stable for discharge: Yes  Time spent: 27 minutes  Kongmeng Santoro [employer]

## 2020-06-27 MED ORDER — LEVETIRACETAM IN NACL 1500 MG/100ML IV SOLN
1500.0000 mg | Freq: Once | INTRAVENOUS | Status: AC
  Administered 2020-06-27: 1500 mg via INTRAVENOUS
  Filled 2020-06-27: qty 100

## 2020-06-27 NOTE — Progress Notes (Signed)
Patient ID: Lance Ray, male   DOB: 1970-11-01, 49 y.o.   MRN: [id number] [facility] PROGRESS NOTE  Lance Ray EUM:[id number] DOB: [birth year] DOA: 05/07/2020 PCP: Patient, No Pcp Per  HPI/Subjective: Patient seen this morning was watching a baseball game on TV.  Offers no complaints.  I mentioned that I spoke with his sister yesterday.  He stated he walked around yesterday.  He stated he had a bowel movement last night.  Objective: Vitals:   06/27/20 0838 06/27/20 1227  BP: 101/71 99/60  Pulse: 74 75  Resp: 13 14  Temp: 97.8 F (36.6 C) 98.7 F (37.1 C)  SpO2: 96% 97%    Intake/Output Summary (Last 24 hours) at 06/27/2020 1552 Last data filed at 06/27/2020 1420 Gross per 24 hour  Intake 600 ml  Output --  Net 600 ml   Filed Weights   05/29/20 0500 05/30/20 0450 05/31/20 0452  Weight: 66.8 kg 64.3 kg 65.5 kg    ROS: Review of Systems  Respiratory: Negative for cough and shortness of breath.   Cardiovascular: Negative for chest pain.  Gastrointestinal: Negative for abdominal pain, nausea and vomiting.   Exam: Physical Exam HENT:     Mouth/Throat:     Pharynx: No oropharyngeal exudate.  Eyes:     General: Lids are normal.     Conjunctiva/sclera: Conjunctivae normal.     Pupils: Pupils are equal, round, and reactive to light.  Cardiovascular:     Rate and Rhythm: Normal rate and regular rhythm.     Heart sounds: Normal heart sounds, S1 normal and S2 normal.  Pulmonary:     Breath sounds: No decreased breath sounds, wheezing, rhonchi or rales.  Abdominal:     Palpations: Abdomen is soft.     Tenderness: There is no abdominal tenderness.  Musculoskeletal:     Right lower leg: No swelling.     Left lower leg: No swelling.  Skin:    General: Skin is warm.     Findings: No rash.  Neurological:     Mental Status: He is alert.     Comments: Answers yes/no questions appropriately.  Able to have a little more of a conversation today.     Scheduled  Meds: . cholecalciferol  1,000 Units Oral Daily  . clonazePAM  0.5 mg Oral BID  . enoxaparin (LOVENOX) injection  40 mg Subcutaneous Q24H  . haloperidol  0.5 mg Oral BID  . lamoTRIgine  100 mg Oral BID   And  . lamoTRIgine  75 mg Oral BID  . levETIRAcetam  1,500 mg Oral BID  . melatonin  5 mg Oral QHS  . OLANZapine  5 mg Oral QHS  . vitamin B-6  50 mg Oral Daily  . sodium chloride flush  10 mL Intravenous Q12H  . thiamine  100 mg Oral Daily  . vitamin B-12  1,000 mcg Oral Daily   Continuous Infusions:  Assessment/Plan:  1. Seizure disorder.  No further seizures with taking medications Keppra, lamotrigine and clonazepam. 2. Vitamin D deficiency.  Continue oral vitamin D. 3. Alcohol abuse while at home.  On thiamine. 4. Impaired insight.  Patient has a guardianship hearing on the 19th.  Patient has chronic impairment with judgment and insight.     Code Status:     Code Status Orders  (From admission, onward)         Start     Ordered   05/07/20 2354  Full code  Continuous  05/07/20 2354        Code Status History    Date Active Date Inactive Code Status Order ID Comments User Context   05/07/2020 2323 05/07/2020 2346 Full Code [id number]  Anselm Jungling, DO Inpatient   04/09/2020 1427 05/07/2020 2323 Full Code [id number]  Audery Amel, MD Inpatient   03/24/2020 2314 04/09/2020 1424 Full Code [id number]  Mansy, Vernetta Honey, MD ED   Advance Care Planning Activity     Family Communication: Spoke with sister on the phone yesterday. Disposition Plan: Status is: Inpatient  Dispo:  Patient From: Home  Planned Disposition: We will need a facility  Expected discharge date: 07/13/20  Medically stable for discharge: Yes  Time spent: 27 minutes  Lance Ray

## 2020-06-28 MED ORDER — LEVETIRACETAM 750 MG PO TABS
1500.0000 mg | ORAL_TABLET | Freq: Two times a day (BID) | ORAL | Status: DC
  Administered 2020-06-28 – 2020-06-29 (×3): 1500 mg via ORAL
  Filled 2020-06-28 (×4): qty 2

## 2020-06-28 NOTE — Plan of Care (Signed)
  Problem: Activity: Goal: Risk for activity intolerance will decrease Outcome: Progressing   

## 2020-06-28 NOTE — Progress Notes (Signed)
Patient ID: Lance Ray, male   DOB: 1970/11/01, 49 y.o.   MRN: [id number] [facility] PROGRESS NOTE  Lance Ray HTD:[id number] DOB: Jan 18, 1971 DOA: 05/07/2020 PCP: Patient, No Pcp Per  HPI/Subjective: Patient feels okay.  States he needed to go on IV Keppra because they did not have any oral Keppra.  Objective: Vitals:   06/28/20 1144 06/28/20 1509  BP: 107/81 98/60  Pulse: 70 85  Resp: 17 17  Temp: 98 F (36.7 C) 98.4 F (36.9 C)  SpO2: 97% 97%    Intake/Output Summary (Last 24 hours) at 06/28/2020 1643 Last data filed at 06/28/2020 0900 Gross per 24 hour  Intake 939.68 ml  Output --  Net 939.68 ml   Filed Weights   05/29/20 0500 05/30/20 0450 05/31/20 0452  Weight: 66.8 kg 64.3 kg 65.5 kg    ROS: Review of Systems  Respiratory: Negative for shortness of breath.   Cardiovascular: Negative for chest pain.  Gastrointestinal: Negative for abdominal pain, nausea and vomiting.   Exam: Physical Exam HENT:     Head: Normocephalic.     Mouth/Throat:     Pharynx: No oropharyngeal exudate.  Eyes:     General: Lids are normal.     Conjunctiva/sclera: Conjunctivae normal.     Pupils: Pupils are equal, round, and reactive to light.  Cardiovascular:     Rate and Rhythm: Normal rate and regular rhythm.     Heart sounds: Normal heart sounds, S1 normal and S2 normal.  Pulmonary:     Breath sounds: No decreased breath sounds, wheezing, rhonchi or rales.  Abdominal:     Palpations: Abdomen is soft.     Tenderness: There is no abdominal tenderness.  Musculoskeletal:     Right lower leg: No swelling.     Left lower leg: No swelling.  Skin:    General: Skin is warm.     Findings: No rash.  Neurological:     Mental Status: He is alert and oriented to person, place, and time.    Scheduled Meds: . cholecalciferol  1,000 Units Oral Daily  . clonazePAM  0.5 mg Oral BID  . enoxaparin (LOVENOX) injection  40 mg Subcutaneous Q24H  . haloperidol  0.5 mg Oral BID   . lamoTRIgine  100 mg Oral BID   And  . lamoTRIgine  75 mg Oral BID  . levETIRAcetam  1,500 mg Oral BID  . melatonin  5 mg Oral QHS  . OLANZapine  5 mg Oral QHS  . vitamin B-6  50 mg Oral Daily  . sodium chloride flush  10 mL Intravenous Q12H  . thiamine  100 mg Oral Daily  . vitamin B-12  1,000 mcg Oral Daily   Continuous Infusions:  Assessment/Plan:  1. Seizure disorder.  Last night need to be given IV Keppra because the patient told me that there was a shortage of the medication that he was on.  He is on the oral Keppra at this time.  Continue lamotrigine and clonazepam. 2. Vitamin D deficiency.  On oral vitamin D 3. History of alcohol abuse on thiamine 4. Impaired insight.  Guardianship hearing on the 19th.  Patient has chronic impairment with judgment and insight.        Code Status:     Code Status Orders  (From admission, onward)         Start     Ordered   05/07/20 2354  Full code  Continuous        05/07/20  2354        Code Status History    Date Active Date Inactive Code Status Order ID Comments User Context   05/07/2020 2323 05/07/2020 2346 Full Code [id number]  Anselm Jungling, DO Inpatient   04/09/2020 1427 05/07/2020 2323 Full Code [id number]  Audery Amel, MD Inpatient   03/24/2020 2314 04/09/2020 1424 Full Code [id number]  Mansy, Vernetta Honey, MD ED   Advance Care Planning Activity     Disposition Plan: Status is: Inpatient  Dispo:  Patient From: Home  Planned Disposition: Transitional care team to work on a place for him to go after his guardianship hearing.  Expected discharge date: 07/13/20  Medically stable for discharge: Yes  Time spent: 25 minutes.  Case discussed with pharmacy staff about the Keppra.  Jazzmin Newbold [employer]  [facility] [employer]

## 2020-06-29 MED ORDER — LEVETIRACETAM ER 500 MG PO TB24
1500.0000 mg | ORAL_TABLET | Freq: Two times a day (BID) | ORAL | Status: DC
  Administered 2020-06-29 – 2020-08-12 (×87): 1500 mg via ORAL
  Filled 2020-06-29 (×90): qty 3

## 2020-06-29 NOTE — Plan of Care (Signed)
  Problem: Health Behavior/Discharge Planning: Goal: Ability to manage health-related needs will improve Outcome: Progressing   Problem: Nutrition: Goal: Adequate nutrition will be maintained Outcome: Progressing   Problem: Coping: Goal: Level of anxiety will decrease Outcome: Progressing   Problem: Pain Managment: Goal: General experience of comfort will improve Outcome: Progressing   Problem: Safety: Goal: Ability to remain free from injury will improve Outcome: Progressing   Problem: Skin Integrity: Goal: Risk for impaired skin integrity will decrease Outcome: Progressing   Problem: Education: Goal: Expressions of having a comfortable level of knowledge regarding the disease process will increase Outcome: Progressing   Problem: Coping: Goal: Ability to adjust to condition or change in health will improve Outcome: Progressing Goal: Ability to identify appropriate support needs will improve Outcome: Progressing   Problem: Health Behavior/Discharge Planning: Goal: Compliance with prescribed medication regimen will improve Outcome: Progressing

## 2020-06-29 NOTE — Progress Notes (Signed)
Patient ID: Lance Ray, male   DOB: 06-22-1971, 49 y.o.   MRN: [id number] [facility] PROGRESS NOTE  Lance Ray PYP:[id number] DOB: 01/01/1971 DOA: 05/07/2020 PCP: Patient, No Pcp Per  HPI/Subjective: Patient feels okay.  States that they got the oral Keppra for him.  Offers no complaints.  Objective: Vitals:   06/29/20 1219 06/29/20 1529  BP: 100/64 109/72  Pulse: 69 87  Resp: 18 18  Temp: 97.7 F (36.5 C) 98.6 F (37 C)  SpO2: 99% 95%   No intake or output data in the 24 hours ending 06/29/20 1557 Filed Weights   05/29/20 0500 05/30/20 0450 05/31/20 0452  Weight: 66.8 kg 64.3 kg 65.5 kg    ROS: Review of Systems  Respiratory: Negative for shortness of breath.   Cardiovascular: Negative for chest pain.  Gastrointestinal: Negative for abdominal pain, nausea and vomiting.   Exam: Physical Exam HENT:     Mouth/Throat:     Pharynx: No oropharyngeal exudate.  Eyes:     General: Lids are normal.     Conjunctiva/sclera: Conjunctivae normal.     Pupils: Pupils are equal, round, and reactive to light.  Cardiovascular:     Rate and Rhythm: Normal rate and regular rhythm.     Heart sounds: Normal heart sounds, S1 normal and S2 normal.  Pulmonary:     Breath sounds: No decreased breath sounds, wheezing, rhonchi or rales.  Abdominal:     Palpations: Abdomen is soft.     Tenderness: There is no abdominal tenderness.  Musculoskeletal:     Right lower leg: No swelling.     Left lower leg: No swelling.  Skin:    General: Skin is warm.     Findings: No rash.  Neurological:     Mental Status: He is alert.     Comments: Answers all questions appropriately.     Scheduled Meds: . cholecalciferol  1,000 Units Oral Daily  . clonazePAM  0.5 mg Oral BID  . enoxaparin (LOVENOX) injection  40 mg Subcutaneous Q24H  . haloperidol  0.5 mg Oral BID  . lamoTRIgine  100 mg Oral BID   And  . lamoTRIgine  75 mg Oral BID  . levETIRAcetam  1,500 mg Oral BID  .  melatonin  5 mg Oral QHS  . OLANZapine  5 mg Oral QHS  . vitamin B-6  50 mg Oral Daily  . sodium chloride flush  10 mL Intravenous Q12H  . thiamine  100 mg Oral Daily  . vitamin B-12  1,000 mcg Oral Daily   Continuous Infusions:  Assessment/Plan:  1. Seizure disorder.  Patient on oral Keppra.  Spoke with the pharmacist today about this.  On lamotrigine and clonazepam. 2. Vitamin D deficiency.  Continue oral vitamin D 3. History of alcohol abuse on thiamine. 4. 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  Guardianship hearing on the 19th.    Code Status:     Code Status Orders  (From admission, onward)         Start     Ordered   05/07/20 2354  Full code  Continuous        05/07/20 2354        Code Status History    Date Active Date Inactive Code Status Order ID Comments User Context   05/07/2020 2323 05/07/2020 2346 Full Code [id number]  Anselm Jungling, DO Inpatient   04/09/2020 1427 05/07/2020 2323 Full Code [id number]  Audery Amel, MD Inpatient   03/24/2020 2314 04/09/2020 1424 Full Code [id number]  Mansy, Vernetta Honey, MD ED   Advance Care Planning Activity     Disposition Plan: Status is: Inpatient  Dispo:  Patient From: Home  Planned Disposition: We will need a place to go after the guardianship.  Case discussed with transitional care team.  Lorita Officer from [employer] is on the case for the guardianship.  Expected discharge date: 07/13/20  Medically stable for discharge: Yes  Time spent: 25 minutes  Tiahna [employer]

## 2020-06-30 NOTE — Progress Notes (Signed)
[facility]  - [place] at [facility]   PATIENT NAME: Lance Ray    MR#:  [id number]  DATE OF BIRTH:  06/13/1971  SUBJECTIVE:  patient just got out of shower. Denies any complaints. No issues per RN. He has been calm and cooperative.  REVIEW OF SYSTEMS:   Review of Systems  Constitutional: Negative for chills, fever and weight loss.  HENT: Negative for ear discharge, ear pain and nosebleeds.   Eyes: Negative for blurred vision, pain and discharge.  Respiratory: Negative for sputum production, shortness of breath, wheezing and stridor.   Cardiovascular: Negative for chest pain, palpitations, orthopnea and PND.  Gastrointestinal: Negative for abdominal pain, diarrhea, nausea and vomiting.  Genitourinary: Negative for frequency and urgency.  Musculoskeletal: Negative for back pain and joint pain.  Neurological: Negative for sensory change, speech change, focal weakness and weakness.  Psychiatric/Behavioral: Negative for depression and hallucinations. The patient is not nervous/anxious.    Tolerating Diet:yes Tolerating PT: self ambulatory  DRUG ALLERGIES:  No Known Allergies  VITALS:  Blood pressure 95/63, pulse 74, temperature 97.8 F (36.6 C), temperature source Oral, resp. rate 18, height 5\' 9"  (1.753 m), weight 65.5 kg, SpO2 96 %.  PHYSICAL EXAMINATION:   Physical Exam  GENERAL:  49 y.o.-year-old patient lying in the bed with no acute distress.  LUNGS: Normal breath sounds bilaterally, no wheezing, rales, rhonchi. No use of accessory muscles of respiration.  CARDIOVASCULAR: S1, S2 normal. No murmurs, rubs, or gallops.  ABDOMEN: Soft, nontender, nondistended. Bowel sounds present. No organomegaly or mass.  EXTREMITIES: No cyanosis, clubbing or edema b/l.    NEUROLOGIC: Cranial nerves II through XII are intact. No focal Motor or sensory deficits b/l.   PSYCHIATRIC:  patient is alert and awake. Mood and affect stable SKIN: No obvious rash, lesion, or  ulcer.   LABORATORY PANEL:  CBC No results for input(s): WBC, HGB, HCT, PLT in the last 168 hours.  Chemistries  No results for input(s): NA, K, CL, CO2, GLUCOSE, BUN, CREATININE, CALCIUM, MG, AST, ALT, ALKPHOS, BILITOT in the last 168 hours.  Invalid input(s): GFRCGP Cardiac Enzymes No results for input(s): TROPONINI in the last 168 hours. RADIOLOGY:  No results found. ASSESSMENT AND PLAN:  [name] is a 49 y.o. male with medical history significant for dementia, agitation and seizure who hospitalist service is being consulted for recurrent seizures.   1. Seizure disorder.  Patient on oral Keppra, lamotrigine and clonazepam. 2. Vitamin D deficiency.  Continue oral vitamin D 3. History of alcohol abuse on thiamine. 4. Impaired insight. -  Patient has chronic impairment with judgment and insight as per psychiatry.  Guardianship hearing on the 19th.   5. Normocytic Anemia -Chronic and appears stable -Hgb/Hct is now 11.7/35.2 on last check 06/09/20  Currently no overt bleeding noted    Family communication : none CODE STATUS: full DVT Prophylaxis : Lovenox  Status is: Inpatient  Remains inpatient appropriate because:Unsafe d/c plan   Dispo:  Patient From:  home  Planned Disposition:  group home   Expected discharge date: TBD  Medically stable for discharge: Yes  patient's barrier to discharge is due to delay in obtaining guardianship. Guardianship hearing is scheduled for October 19.         TOTAL TIME TAKING CARE OF THIS PATIENT: *15* minutes.  >50% time spent on counselling and coordination of care  Note: This dictation was prepared with Dragon dictation along with smaller phrase technology. Any transcriptional errors that result from this  process are unintentional.  Enedina Finner M.D    [facility]   CC: Primary care physician; Patient, No Pcp PerPatient ID: Lance Ray, male   DOB: 1970-11-10, 48 y.o.   MRN: [id number]

## 2020-06-30 NOTE — Plan of Care (Signed)
  Problem: Activity: Goal: Risk for activity intolerance will decrease Outcome: Progressing   Problem: Education: Goal: Knowledge of General Education information will improve Description: Including pain rating scale, medication(s)/side effects and non-pharmacologic comfort measures Outcome: Progressing   Problem: Health Behavior/Discharge Planning: Goal: Ability to manage health-related needs will improve Outcome: Progressing   Problem: Clinical Measurements: Goal: Ability to maintain clinical measurements within normal limits will improve Outcome: Progressing Goal: Will remain free from infection Outcome: Progressing Goal: Diagnostic test results will improve Outcome: Progressing Goal: Respiratory complications will improve Outcome: Progressing Goal: Cardiovascular complication will be avoided Outcome: Progressing   Problem: Nutrition: Goal: Adequate nutrition will be maintained Outcome: Progressing   Problem: Coping: Goal: Level of anxiety will decrease Outcome: Progressing   Problem: Elimination: Goal: Will not experience complications related to bowel motility Outcome: Progressing Goal: Will not experience complications related to urinary retention Outcome: Progressing   Problem: Pain Managment: Goal: General experience of comfort will improve Outcome: Progressing   Problem: Safety: Goal: Ability to remain free from injury will improve Outcome: Progressing   Problem: Skin Integrity: Goal: Risk for impaired skin integrity will decrease Outcome: Progressing

## 2020-07-01 NOTE — Plan of Care (Signed)
  Problem: Education: Goal: Knowledge of General Education information will improve Description Including pain rating scale, medication(s)/side effects and non-pharmacologic comfort measures Outcome: Progressing   

## 2020-07-01 NOTE — Progress Notes (Signed)
[facility]  - [place] at [facility]   PATIENT NAME: Lance Ray    MR#:  [id number]  DATE OF BIRTH:  February 26, 1971  SUBJECTIVE:   Denies any complaints. No issues per RN. He has been calm and cooperative.  REVIEW OF SYSTEMS:   Review of Systems  Constitutional: Negative for chills, fever and weight loss.  HENT: Negative for ear discharge, ear pain and nosebleeds.   Eyes: Negative for blurred vision, pain and discharge.  Respiratory: Negative for sputum production, shortness of breath, wheezing and stridor.   Cardiovascular: Negative for chest pain, palpitations, orthopnea and PND.  Gastrointestinal: Negative for abdominal pain, diarrhea, nausea and vomiting.  Genitourinary: Negative for frequency and urgency.  Musculoskeletal: Negative for back pain and joint pain.  Neurological: Negative for sensory change, speech change, focal weakness and weakness.  Psychiatric/Behavioral: Negative for depression and hallucinations. The patient is not nervous/anxious.    Tolerating Diet:yes Tolerating PT: self ambulatory  DRUG ALLERGIES:  No Known Allergies  VITALS:  Blood pressure 99/68, pulse 72, temperature 97.8 F (36.6 C), temperature source Oral, resp. rate 16, height 5\' 9"  (1.753 m), weight 65.5 kg, SpO2 98 %.  PHYSICAL EXAMINATION:   Physical Exam  GENERAL:  49 y.o.-year-old patient lying in the bed with no acute distress.  LUNGS: Normal breath sounds bilaterally, no wheezing, rales, rhonchi. No use of accessory muscles of respiration.  CARDIOVASCULAR: S1, S2 normal. No murmurs, rubs, or gallops.  ABDOMEN: Soft, nontender, nondistended. Bowel sounds present. No organomegaly or mass.  EXTREMITIES: No cyanosis, clubbing or edema b/l.    NEUROLOGIC: Cranial nerves II through XII are intact. No focal Motor or sensory deficits b/l.   PSYCHIATRIC:  patient is alert and awake. Mood and affect stable  LABORATORY PANEL:  CBC No results for input(s): WBC, HGB, HCT,  PLT in the last 168 hours.  Chemistries  No results for input(s): NA, K, CL, CO2, GLUCOSE, BUN, CREATININE, CALCIUM, MG, AST, ALT, ALKPHOS, BILITOT in the last 168 hours.  Invalid input(s): GFRCGP Cardiac Enzymes No results for input(s): TROPONINI in the last 168 hours. RADIOLOGY:  No results found. ASSESSMENT AND PLAN:  BRYLER DIBBLE is a 49 y.o. male with medical history significant for dementia, agitation and seizure who hospitalist service is being consulted for recurrent seizures.   1. Seizure disorder.  Patient on oral Keppra, lamotrigine and clonazepam. 2. Vitamin D deficiency.  Continue oral vitamin D 3. History of alcohol abuse on thiamine. 4. Impaired insight. -  Patient has chronic impairment with judgment and insight as per psychiatry.  Guardianship hearing on the 19th.   5. Normocytic Anemia -Chronic and appears stable -Hgb/Hct is now 11.7/35.2 on last check 06/09/20  TOC working on d/c planning--review their notes for daily details    Family communication : none CODE STATUS: full DVT Prophylaxis : Lovenox  Status is: Inpatient  Remains inpatient appropriate because:Unsafe d/c plan   Dispo:  Patient From:  home  Planned Disposition:  group home   Expected discharge date: TBD  Medically stable for discharge: Yes  patient's barrier to discharge is due to delay in obtaining guardianship. Guardianship hearing is scheduled for October 19.         TOTAL TIME TAKING CARE OF THIS PATIENT: *15* minutes.  >50% time spent on counselling and coordination of care  Note: This dictation was prepared with Dragon dictation along with smaller phrase technology. Any transcriptional errors that result from this process are unintentional.  October 21.D  [facility]   CC: Primary care physician; Patient, No Pcp PerPatient ID: Lance Ray, male   DOB: 11-29-1970, 49 y.o.   MRN: [id number]

## 2020-07-01 NOTE — TOC Progression Note (Addendum)
Transition of Care [facility]) - Progression Note    Patient Details  Name: Lance Ray MRN: [id number] Date of Birth: [birth year]  Transition of Care [facility]) CM/SW Contact  [place] Cellar, RN Phone Number: 07/01/2020, 9:35 AM  Clinical Narrative:    Outreach to [facility] @ [facility] Home [phone] requesting availability and possibility of accepting patient with LOG prior to court date for guardianship. Rosanne Sack reports he is planning to admit resident tomorrow which would leave him with no availability however he will call writer back if opening develops.   Spoke to [place] @ [facility] [phone] requesting availability and possibility of accepting patient with LOG. Alcario Drought requested Clinical research associate call [name] @ [phone] for discussion.   [facility] for [name] @ [phone] for discussion regarding possible placement.        Expected Discharge Plan and Services                                                 Social Determinants of Health (SDOH) Interventions    Readmission Risk Interventions Readmission Risk Prevention Plan 03/29/2020  Transportation Screening Complete  HRI or Home Care Consult Complete  Social Work Consult for Recovery Care Planning/Counseling Complete  Palliative Care Screening Not Applicable  Medication Review [employer]) Referral to Pharmacy  Some recent data might be hidden

## 2020-07-01 NOTE — Plan of Care (Signed)
  Problem: Activity: Goal: Risk for activity intolerance will decrease Outcome: Progressing   Problem: Education: Goal: Knowledge of General Education information will improve Description: Including pain rating scale, medication(s)/side effects and non-pharmacologic comfort measures Outcome: Progressing   Problem: Health Behavior/Discharge Planning: Goal: Ability to manage health-related needs will improve Outcome: Progressing   Problem: Clinical Measurements: Goal: Ability to maintain clinical measurements within normal limits will improve Outcome: Progressing Goal: Will remain free from infection Outcome: Progressing Goal: Diagnostic test results will improve Outcome: Progressing Goal: Respiratory complications will improve Outcome: Progressing Goal: Cardiovascular complication will be avoided Outcome: Progressing   Problem: Nutrition: Goal: Adequate nutrition will be maintained Outcome: Progressing   Problem: Coping: Goal: Level of anxiety will decrease Outcome: Progressing

## 2020-07-02 NOTE — Progress Notes (Signed)
[facility]  - [place] at [facility]   PATIENT NAME: Lance Ray    MR#:  [id number]  DATE OF BIRTH:  1971/08/20  SUBJECTIVE:   Denies any complaints. No issues per RN. He has been calm and cooperative.  REVIEW OF SYSTEMS:   Review of Systems  Constitutional: Negative for chills, fever and weight loss.  HENT: Negative for ear discharge, ear pain and nosebleeds.   Eyes: Negative for blurred vision, pain and discharge.  Respiratory: Negative for sputum production, shortness of breath, wheezing and stridor.   Cardiovascular: Negative for chest pain, palpitations, orthopnea and PND.  Gastrointestinal: Negative for abdominal pain, diarrhea, nausea and vomiting.  Genitourinary: Negative for frequency and urgency.  Musculoskeletal: Negative for back pain and joint pain.  Neurological: Negative for sensory change, speech change, focal weakness and weakness.  Psychiatric/Behavioral: Negative for depression and hallucinations. The patient is not nervous/anxious.    Tolerating Diet:yes Tolerating PT: self ambulatory  DRUG ALLERGIES:  No Known Allergies  VITALS:  Blood pressure 110/74, pulse 71, temperature 98 F (36.7 C), temperature source Oral, resp. rate 16, height 5\' 9"  (1.753 m), weight 65.5 kg, SpO2 97 %.  PHYSICAL EXAMINATION:   Physical Exam  GENERAL:  49 y.o.-year-old patient lying in the bed with no acute distress.  LUNGS: Normal breath sounds bilaterally, no wheezing, rales, rhonchi. No use of accessory muscles of respiration.  CARDIOVASCULAR: S1, S2 normal. No murmurs, rubs, or gallops.  ABDOMEN: Soft, nontender, nondistended. Bowel sounds present. No organomegaly or mass.  EXTREMITIES: No cyanosis, clubbing or edema b/l.    NEUROLOGIC: Cranial nerves II through XII are intact. No focal Motor or sensory deficits b/l.   PSYCHIATRIC:  patient is alert and awake. Mood and affect stable  LABORATORY PANEL:  CBC No results for input(s): WBC, HGB, HCT,  PLT in the last 168 hours.  Chemistries  No results for input(s): NA, K, CL, CO2, GLUCOSE, BUN, CREATININE, CALCIUM, MG, AST, ALT, ALKPHOS, BILITOT in the last 168 hours.  Invalid input(s): GFRCGP Cardiac Enzymes No results for input(s): TROPONINI in the last 168 hours. RADIOLOGY:  No results found. ASSESSMENT AND PLAN:  Lance Ray is a 49 y.o. male with medical history significant for dementia, agitation and seizure who hospitalist service is being consulted for recurrent seizures.   Seizure disorder.  Patient on oral Keppra, lamotrigine and clonazepam. Vitamin D deficiency.  Continue oral vitamin D History of alcohol abuse on thiamine. Impaired insight. -  Patient has chronic impairment with judgment and insight as per psychiatry.  Guardianship hearing on the 19th.   5. Normocytic Anemia -Chronic and appears stable -Hgb/Hct is now 11.7/35.2 on last check 06/09/20  TOC working on d/c planning--review their notes for daily details    Family communication : none CODE STATUS: full DVT Prophylaxis : Lovenox  Status is: Inpatient  Remains inpatient appropriate because:Unsafe d/c plan   Dispo:  Patient From:  home  Planned Disposition:  group home   Expected discharge date: TBD  Medically stable for discharge: Yes  patient's barrier to discharge is due to delay in obtaining guardianship. Guardianship hearing is scheduled for October 19.         TOTAL TIME TAKING CARE OF THIS PATIENT: *15* minutes.  >50% time spent on counselling and coordination of care  Note: This dictation was prepared with Dragon dictation along with smaller phrase technology. Any transcriptional errors that result from this process are unintentional.  Enedina Finner M.D    [facility]  CC: Primary care physician; Patient, No Pcp PerPatient ID: Lance Ray, male   DOB: 1971/02/04, 49 y.o.   MRN: [id number]

## 2020-07-03 NOTE — Progress Notes (Signed)
[facility]  - [place] at [facility]   PATIENT NAME: Lance Ray    MR#:  [id number]  DATE OF BIRTH:  May 06, 1971  SUBJECTIVE:   Denies any complaints. No issues per RN. He has been calm and cooperative.  REVIEW OF SYSTEMS:   ROS Tolerating Diet:yes Tolerating PT: self ambulatory  DRUG ALLERGIES:  No Known Allergies  VITALS:  Blood pressure 104/78, pulse 66, temperature 98.5 F (36.9 C), temperature source Oral, resp. rate 13, height 5\' 9"  (1.753 m), weight 65.5 kg, SpO2 98 %.  PHYSICAL EXAMINATION:   Physical Exam  GENERAL:  49 y.o.-year-old patient lying in the bed with no acute distress.  LUNGS: Normal breath sounds bilaterally, no wheezing, rales, rhonchi. No use of accessory muscles of respiration.  CARDIOVASCULAR: S1, S2 normal. No murmurs, rubs, or gallops.  ABDOMEN: Soft, nontender, nondistended. Bowel sounds present. No organomegaly or mass.  EXTREMITIES: No cyanosis, clubbing or edema b/l.    NEUROLOGIC: Cranial nerves II through XII are intact. No focal Motor or sensory deficits b/l.   PSYCHIATRIC:  patient is alert and awake. Mood and affect stable  LABORATORY PANEL:  CBC No results for input(s): WBC, HGB, HCT, PLT in the last 168 hours.  Chemistries  No results for input(s): NA, K, CL, CO2, GLUCOSE, BUN, CREATININE, CALCIUM, MG, AST, ALT, ALKPHOS, BILITOT in the last 168 hours.  Invalid input(s): GFRCGP Cardiac Enzymes No results for input(s): TROPONINI in the last 168 hours. RADIOLOGY:  No results found. ASSESSMENT AND PLAN:  Lance Ray is a 49 y.o. male with medical history significant for dementia, agitation and seizure who hospitalist service is being consulted for recurrent seizures.   Seizure disorder.  Patient on oral Keppra, lamotrigine and clonazepam. Vitamin D deficiency.  Continue oral vitamin D History of alcohol abuse on thiamine. Impaired insight. -  Patient has chronic impairment with judgment and insight as  per psychiatry.  Guardianship hearing on the 19th.   5. Normocytic Anemia -Chronic and appears stable -Hgb/Hct is now 11.7/35.2 on last check 06/09/20  TOC working on d/c planning--review their notes for daily details    Family communication : none CODE STATUS: full DVT Prophylaxis : Lovenox  Status is: Inpatient  Remains inpatient appropriate because:Unsafe d/c plan   Dispo:  Patient From:  home  Planned Disposition:  group home   Expected discharge date: TBD  Medically stable for discharge: Yes  patient's barrier to discharge is due to delay in obtaining guardianship. Guardianship hearing is scheduled for October 19.         TOTAL TIME TAKING CARE OF THIS PATIENT: *15* minutes.  >50% time spent on counselling and coordination of care  Note: This dictation was prepared with Dragon dictation along with smaller phrase technology. Any transcriptional errors that result from this process are unintentional.  Enedina Finner M.D    [facility]   CC: Primary care physician; Patient, No Pcp PerPatient ID: Lance Ray, male   DOB: 1971-09-25, 49 y.o.   MRN: [id number]

## 2020-07-05 NOTE — Progress Notes (Signed)
[facility]  - [place] at [facility] [facility]   PATIENT NAME: Lance Ray    MR#:  [id number]  DATE OF BIRTH:  1971-04-29  SUBJECTIVE:   Denies any complaints. No issues per RN. He has been calm and cooperative.  REVIEW OF SYSTEMS:   ROS Tolerating Diet:yes Tolerating PT: self ambulatory  DRUG ALLERGIES:  No Known Allergies  VITALS:  Blood pressure 105/78, pulse 69, temperature 98.2 F (36.8 C), temperature source Oral, resp. rate 14, height 5\' 9"  (1.753 m), weight 65.5 kg, SpO2 97 %.  PHYSICAL EXAMINATION:   Physical Exam  GENERAL:  49 y.o.-year-old patient lying in the bed with no acute distress.  LUNGS: Normal breath sounds bilaterally, no wheezing, rales, rhonchi. No use of accessory muscles of respiration.  CARDIOVASCULAR: S1, S2 normal. No murmurs, rubs, or gallops.  ABDOMEN: Soft, nontender, nondistended. Bowel sounds present. No organomegaly or mass.  EXTREMITIES: No cyanosis, clubbing or edema b/l.    NEUROLOGIC: Cranial nerves II through XII are intact. No focal Motor or sensory deficits b/l.   PSYCHIATRIC:  patient is alert and awake. Mood and affect stable  LABORATORY PANEL:  CBC No results for input(s): WBC, HGB, HCT, PLT in the last 168 hours.  Chemistries  No results for input(s): NA, K, CL, CO2, GLUCOSE, BUN, CREATININE, CALCIUM, MG, AST, ALT, ALKPHOS, BILITOT in the last 168 hours.  Invalid input(s): GFRCGP Cardiac Enzymes No results for input(s): TROPONINI in the last 168 hours. RADIOLOGY:  No results found. ASSESSMENT AND PLAN:  [name] is a 48 y.o. male with medical history significant for dementia, agitation and seizure who hospitalist service is being consulted for recurrent seizures.   Seizure disorder.  Patient on oral Keppra, lamotrigine and clonazepam. Vitamin D deficiency.  Continue oral vitamin D History of alcohol abuse on thiamine. Impaired insight. -  Patient has chronic impairment with judgment and insight as  per psychiatry.  Guardianship hearing on the 19th.   5. Normocytic Anemia -Chronic and appears stable   TOC working on d/c planning--review their notes for daily details    Family communication : none CODE STATUS: full DVT Prophylaxis : Lovenox  Status is: Inpatient  Remains inpatient appropriate because:Unsafe d/c plan   Dispo:  Patient From:  home  Planned Disposition:  group home   Expected discharge date: TBD  Medically stable for discharge: Yes  patient's barrier to discharge is due to delay in obtaining guardianship. Guardianship hearing is scheduled for October 19.         TOTAL TIME TAKING CARE OF THIS PATIENT: *15* minutes.  >50% time spent on counselling and coordination of care  Note: This dictation was prepared with Dragon dictation along with smaller phrase technology. Any transcriptional errors that result from this process are unintentional.  Enedina Finner M.D    [facility]   CC: Primary care physician; Patient, No Pcp PerPatient ID: Lance Ray, male   DOB: July 01, 1971, 49 y.o.   MRN: [id number]

## 2020-07-05 NOTE — TOC Progression Note (Signed)
Transition of Care [facility]) - Progression Note    Patient Details  Name: Lance Ray MRN: [id number] Date of Birth: 01-09-1971  Transition of Care [facility]) CM/SW Contact  Tome Cellar, RN Phone Number: 07/05/2020, 12:59 PM  Clinical Narrative:     Spoke to [name] [phone] agreed to review patient for possible LOG to allow admission to group home prior to [facility] approval/Guardianship court date. Faxed information to [phone] as requested.        Expected Discharge Plan and Services                                                 Social Determinants of Health (SDOH) Interventions    Readmission Risk Interventions Readmission Risk Prevention Plan 03/29/2020  Transportation Screening Complete  HRI or Home Care Consult Complete  Social Work Consult for Recovery Care Planning/Counseling Complete  Palliative Care Screening Not Applicable  Medication Review [facility]) Referral to Pharmacy  Some recent data might be hidden

## 2020-07-06 LAB — CREATININE, SERUM
Creatinine, Ser: 1.19 mg/dL (ref 0.61–1.24)
GFR, Estimated: >60 mL/min (ref >60)

## 2020-07-06 NOTE — Progress Notes (Signed)
[facility]  - [place] at [facility]   PATIENT NAME: Lance Ray    MR#:  [id number]  DATE OF BIRTH:  1970-11-27  SUBJECTIVE:   Denies any complaints.  He has been calm and cooperative.  REVIEW OF SYSTEMS:   ROS Tolerating Diet:yes Tolerating PT: self ambulatory  DRUG ALLERGIES:  No Known Allergies  VITALS:  Blood pressure 104/69, pulse 72, temperature 98.4 F (36.9 C), temperature source Oral, resp. rate 14, height 5\' 9"  (1.753 m), weight 65.5 kg, SpO2 99 %.  PHYSICAL EXAMINATION:   Physical Exam  GENERAL:  49 y.o.-year-old patient lying in the bed with no acute distress.  LUNGS: Normal breath sounds bilaterally, no wheezing, rales, rhonchi. No use of accessory muscles of respiration.  CARDIOVASCULAR: S1, S2 normal. No murmurs, rubs, or gallops.  ABDOMEN: Soft, nontender, nondistended. Bowel sounds present. No organomegaly or mass.  EXTREMITIES: No cyanosis, clubbing or edema b/l.    NEUROLOGIC: Cranial nerves II through XII are intact. No focal Motor or sensory deficits b/l.   PSYCHIATRIC:  patient is alert and awake. Mood and affect stable  LABORATORY PANEL:  CBC No results for input(s): WBC, HGB, HCT, PLT in the last 168 hours.  Chemistries  Recent Labs  Lab 07/06/20 0418  CREATININE 1.19   Cardiac Enzymes No results for input(s): TROPONINI in the last 168 hours. RADIOLOGY:  No results found. ASSESSMENT AND PLAN:  Lance Ray is a 49 y.o. male with medical history significant for dementia, agitation and seizure who hospitalist service is being consulted for recurrent seizures.   1. Seizure disorder.  Patient on oral Keppra, lamotrigine and clonazepam. 2. Vitamin D deficiency.  Continue oral vitamin D 3. History of alcohol abuse on thiamine. 4. Impaired insight. -  Patient has chronic impairment with judgment and insight as per psychiatry.  Guardianship hearing on the 19th.   5. Normocytic Anemia -Chronic and appears  stable   TOC working on d/c planning--review their notes for daily details    Family communication : none CODE STATUS: full DVT Prophylaxis : Lovenox  Status is: Inpatient  Remains inpatient appropriate because:Unsafe d/c plan   Dispo:  Patient From:  home  Planned Disposition:  group home   Expected discharge date: TBD  Medically stable for discharge:    patient's barrier to discharge is due to delay in obtaining guardianship. Guardianship hearing is scheduled for October 19.         TOTAL TIME TAKING CARE OF THIS PATIENT: *15* minutes.  >50% time spent on counselling and coordination of care  Note: This dictation was prepared with Dragon dictation along with smaller phrase technology. Any transcriptional errors that result from this process are unintentional.  October 21 M.D    [facility]   CC: Primary care physician; Patient, No Pcp PerPatient ID: Lance Ray, male   DOB: December 20, 1970, 49 y.o.   MRN: [id number]

## 2020-07-07 NOTE — Plan of Care (Signed)
°  Problem: Coping: °Goal: Level of anxiety will decrease °Outcome: Progressing °  °

## 2020-07-07 NOTE — Progress Notes (Addendum)
Progress Note    Lance Ray  HAL:[id number] DOB: 12/13/1970  DOA: 05/07/2020 PCP: Patient, No Pcp Per      Brief Narrative:    Medical records reviewed and are as summarized below:  Lance Ray is a 49 y.o. malewith medical history significant fordementia, agitation and seizure.  Hospitalist team was consulted by psychiatrist for recurrent seizures.    Patient initally admitted on 6/30 under hospitalist service for generalized tonic clonic seizuredue to non-compliance with Depakote.He was loaded with IV lorazepam and keppra and admitted to PCU.Had negative MRI brain and CT head. Hospital course was complicated by psychosis and agitation and required precedex gtt for agitation. Psychiatry wasconsulted and he wasultimatelydischarged to behavior health on 7/16 and was placed under involuntary commitment period.  While in the behavioral health unit, his sitter apparently heard the patient scream and witnessed patient have a generalized tonic-clonic seizure that lasted about 2 minutes. He was seen in consultation by the neurologist.   Patient does not have capacity to make decisions for himself.  He has been evaluated by the psychiatrist who said patient has no capacity to make decision for himself.  Patient is awaiting legal guardianship prior to discharge.  This is scheduled for July 13, 2020.       Assessment/Plan:   Principal Problem:   Seizure (HCC) Active Problems:   Psychosis (HCC)   Hypotension   Vitamin D deficiency   Impaired insight   Alcohol abuse   Body mass index is 21.31 kg/m.    PLAN  Continue antipsychotics Continue antiepileptics Continue vitamin B12 supplement for B12 deficiency  Continue vitamin D for vitamin D deficiency Awaiting guardianship.  On July 13, 2020.   Diet Order            Diet regular Room service appropriate? Yes; Fluid consistency: Thin  Diet effective now                     Consultants:  Psychiatrist  Neurologist  Procedures:  None    Medications:   . cholecalciferol  1,000 Units Oral Daily  . clonazePAM  0.5 mg Oral BID  . enoxaparin (LOVENOX) injection  40 mg Subcutaneous Q24H  . haloperidol  0.5 mg Oral BID  . lamoTRIgine  100 mg Oral BID   And  . lamoTRIgine  75 mg Oral BID  . levETIRAcetam  1,500 mg Oral BID  . melatonin  5 mg Oral QHS  . OLANZapine  5 mg Oral QHS  . vitamin B-6  50 mg Oral Daily  . sodium chloride flush  10 mL Intravenous Q12H  . thiamine  100 mg Oral Daily  . vitamin B-12  1,000 mcg Oral Daily   Continuous Infusions:   Anti-infectives (From admission, onward)   None             Family Communication/Anticipated D/C date and plan/Code Status   DVT prophylaxis: enoxaparin (LOVENOX) injection 40 mg Start: 05/08/20 0000     Code Status: Full Code  Family Communication: None Disposition Plan:    Status is: Inpatient  Remains inpatient appropriate because:Unsafe d/c plan   Dispo:  Patient From:  Home  Planned Disposition:  Group home  Expected discharge date:  To be determined  Medically stable for discharge:  Yes, but awaiting guardianship prior to discharge            Subjective:   No acute issues overnight.  No complaints.  No shortness of  breath or chest pain.  Objective:    Vitals:   07/07/20 0001 07/07/20 0426 07/07/20 0727 07/07/20 1118  BP: 105/68 95/70 91/63  97/68  Pulse: 92 70 60 78  Resp: 20 16 19 16   Temp: 98.6 F (37 C) 97.8 F (36.6 C)  97.9 F (36.6 C)  TempSrc: Oral Oral  Oral  SpO2: 95% 95% 98% 99%  Weight:      Height:       No data found.   Intake/Output Summary (Last 24 hours) at 07/07/2020 1410 Last data filed at 07/07/2020 1022 Gross per 24 hour  Intake 480 ml  Output --  Net 480 ml   Filed Weights   05/29/20 0500 05/30/20 0450 05/31/20 0452  Weight: 66.8 kg 64.3 kg 65.5 kg    Exam:  GEN: NAD SKIN: No rash EYES:  EOMI ENT: MMM CV: RRR PULM: CTA B ABD: soft, ND, NT, +BS CNS: Drowsy but arousable and oriented, non focal EXT: No edema or tenderness   Data Reviewed:   I have personally reviewed following labs and imaging studies:  Labs: Labs show the following:   Basic Metabolic Panel: Recent Labs  Lab 07/06/20 0418  CREATININE 1.19   GFR Estimated Creatinine Clearance: 68.8 mL/min (by C-G formula based on SCr of 1.19 mg/dL). Liver Function Tests: No results for input(s): AST, ALT, ALKPHOS, BILITOT, PROT, ALBUMIN in the last 168 hours. No results for input(s): LIPASE, AMYLASE in the last 168 hours. No results for input(s): AMMONIA in the last 168 hours. Coagulation profile No results for input(s): INR, PROTIME in the last 168 hours.  CBC: No results for input(s): WBC, NEUTROABS, HGB, HCT, MCV, PLT in the last 168 hours. Cardiac Enzymes: No results for input(s): CKTOTAL, CKMB, CKMBINDEX, TROPONINI in the last 168 hours. BNP (last 3 results) No results for input(s): PROBNP in the last 8760 hours. CBG: No results for input(s): GLUCAP in the last 168 hours. D-Dimer: No results for input(s): DDIMER in the last 72 hours. Hgb A1c: No results for input(s): HGBA1C in the last 72 hours. Lipid Profile: No results for input(s): CHOL, HDL, LDLCALC, TRIG, CHOLHDL, LDLDIRECT in the last 72 hours. Thyroid function studies: No results for input(s): TSH, T4TOTAL, T3FREE, THYROIDAB in the last 72 hours.  Invalid input(s): FREET3 Anemia work up: No results for input(s): VITAMINB12, FOLATE, FERRITIN, TIBC, IRON, RETICCTPCT in the last 72 hours. Sepsis Labs: No results for input(s): PROCALCITON, WBC, LATICACIDVEN in the last 168 hours.  Microbiology No results found for this or any previous visit (from the past 240 hour(s)).  Procedures and diagnostic studies:  No results found.             LOS: 61 days   [name]  [facility] 09/05/20 on [email or web address] If 7PM-7AM,  please contact night-coverage at [email or web address]     07/07/2020, 2:10 PM

## 2020-07-08 NOTE — Plan of Care (Signed)
  Problem: Activity: Goal: Risk for activity intolerance will decrease Outcome: Progressing   

## 2020-07-08 NOTE — Plan of Care (Signed)
  Problem: Activity: Goal: Risk for activity intolerance will decrease 07/08/2020 0327 by Melburn Popper, RN Outcome: Progressing 07/08/2020 0140 by Melburn Popper, RN Outcome: Progressing

## 2020-07-08 NOTE — Plan of Care (Signed)
  Problem: Activity: Goal: Risk for activity intolerance will decrease Outcome: Progressing   Problem: Education: Goal: Knowledge of General Education information will improve Description: Including pain rating scale, medication(s)/side effects and non-pharmacologic comfort measures Outcome: Progressing   Problem: Health Behavior/Discharge Planning: Goal: Ability to manage health-related needs will improve Outcome: Progressing   Problem: Clinical Measurements: Goal: Ability to maintain clinical measurements within normal limits will improve Outcome: Progressing Goal: Will remain free from infection Outcome: Progressing Goal: Diagnostic test results will improve Outcome: Progressing Goal: Respiratory complications will improve Outcome: Progressing Goal: Cardiovascular complication will be avoided Outcome: Progressing   Problem: Nutrition: Goal: Adequate nutrition will be maintained Outcome: Progressing   Problem: Coping: Goal: Level of anxiety will decrease Outcome: Progressing   

## 2020-07-08 NOTE — Progress Notes (Signed)
Progress Note    Lance Ray  ZOX:[id number] DOB: 01/09/1971  DOA: 05/07/2020 PCP: Patient, No Pcp Per      Brief Narrative:    Medical records reviewed and are as summarized below:  Lance Ray is a 49 y.o. malewith medical history significant fordementia, agitation and seizure.  Hospitalist team was consulted by psychiatrist for recurrent seizures.    Patient initally admitted on 6/30 under hospitalist service for generalized tonic clonic seizuredue to non-compliance with Depakote.He was loaded with IV lorazepam and keppra and admitted to PCU.Had negative MRI brain and CT head. Hospital course was complicated by psychosis and agitation and required precedex gtt for agitation. Psychiatry wasconsulted and he wasultimatelydischarged to behavior health on 7/16 and was placed under involuntary commitment period.  While in the behavioral health unit, his sitter apparently heard the patient scream and witnessed patient have a generalized tonic-clonic seizure that lasted about 2 minutes. He was seen in consultation by the neurologist.   Patient does not have capacity to make decisions for himself.  He has been evaluated by the psychiatrist who said patient has no capacity to make decision for himself.  Patient is awaiting legal guardianship prior to discharge.  This is scheduled for July 13, 2020.       Assessment/Plan:   Principal Problem:   Seizure (HCC) Active Problems:   Psychosis (HCC)   Hypotension   Vitamin D deficiency   Impaired insight   Alcohol abuse   Body mass index is 21.31 kg/m.    PLAN  Continue antipsychotics Continue antiepileptics Continue vitamin B12 supplement for vitamin B12 deficiency Continue vitamin D supplement for vitamin D deficiency Awaiting guardianship scheduled for July 13, 2020.   Diet Order            Diet regular Room service appropriate? Yes; Fluid consistency: Thin  Diet effective now                      Consultants:  Psychiatrist  Neurologist  Procedures:  None    Medications:   . cholecalciferol  1,000 Units Oral Daily  . clonazePAM  0.5 mg Oral BID  . enoxaparin (LOVENOX) injection  40 mg Subcutaneous Q24H  . haloperidol  0.5 mg Oral BID  . lamoTRIgine  100 mg Oral BID   And  . lamoTRIgine  75 mg Oral BID  . levETIRAcetam  1,500 mg Oral BID  . melatonin  5 mg Oral QHS  . OLANZapine  5 mg Oral QHS  . vitamin B-6  50 mg Oral Daily  . sodium chloride flush  10 mL Intravenous Q12H  . thiamine  100 mg Oral Daily  . vitamin B-12  1,000 mcg Oral Daily   Continuous Infusions:   Anti-infectives (From admission, onward)   None             Family Communication/Anticipated D/C date and plan/Code Status   DVT prophylaxis: enoxaparin (LOVENOX) injection 40 mg Start: 05/08/20 0000     Code Status: Full Code  Family Communication: None Disposition Plan:    Status is: Inpatient  Remains inpatient appropriate because:Unsafe d/c plan   Dispo:  Patient From:  Home  Planned Disposition:  Group home  Expected discharge date:  To be determined  Medically stable for discharge:  Yes, but awaiting guardianship prior to discharge            Subjective:   Interval events noted.  No complaints.  Objective:  Vitals:   07/07/20 2343 07/08/20 0349 07/08/20 0757 07/08/20 1150  BP: 111/76 94/65 100/74 97/74  Pulse: 74 67 73 71  Resp: 16 18 16 15   Temp: 98.2 F (36.8 C) 98.1 F (36.7 C) 97.7 F (36.5 C) 97.6 F (36.4 C)  TempSrc: Oral Oral  Oral  SpO2: 97% 97% 98% 97%  Weight:      Height:       No data found.   Intake/Output Summary (Last 24 hours) at 07/08/2020 1233 Last data filed at 07/08/2020 1024 Gross per 24 hour  Intake 720 ml  Output --  Net 720 ml   Filed Weights   05/29/20 0500 05/30/20 0450 05/31/20 0452  Weight: 66.8 kg 64.3 kg 65.5 kg    Exam:  GEN: NAD SKIN: No rash EYES: No pallor.   Anicteric ENT: MMM CV: RRR PULM: CTA B ABD: soft, ND, NT, +BS CNS: AAO x 3, non focal EXT: No edema or tenderness    Data Reviewed:   I have personally reviewed following labs and imaging studies:  Labs: Labs show the following:   Basic Metabolic Panel: Recent Labs  Lab 07/06/20 0418  CREATININE 1.19   GFR Estimated Creatinine Clearance: 68.8 mL/min (by C-G formula based on SCr of 1.19 mg/dL). Liver Function Tests: No results for input(s): AST, ALT, ALKPHOS, BILITOT, PROT, ALBUMIN in the last 168 hours. No results for input(s): LIPASE, AMYLASE in the last 168 hours. No results for input(s): AMMONIA in the last 168 hours. Coagulation profile No results for input(s): INR, PROTIME in the last 168 hours.  CBC: No results for input(s): WBC, NEUTROABS, HGB, HCT, MCV, PLT in the last 168 hours. Cardiac Enzymes: No results for input(s): CKTOTAL, CKMB, CKMBINDEX, TROPONINI in the last 168 hours. BNP (last 3 results) No results for input(s): PROBNP in the last 8760 hours. CBG: No results for input(s): GLUCAP in the last 168 hours. D-Dimer: No results for input(s): DDIMER in the last 72 hours. Hgb A1c: No results for input(s): HGBA1C in the last 72 hours. Lipid Profile: No results for input(s): CHOL, HDL, LDLCALC, TRIG, CHOLHDL, LDLDIRECT in the last 72 hours. Thyroid function studies: No results for input(s): TSH, T4TOTAL, T3FREE, THYROIDAB in the last 72 hours.  Invalid input(s): FREET3 Anemia work up: No results for input(s): VITAMINB12, FOLATE, FERRITIN, TIBC, IRON, RETICCTPCT in the last 72 hours. Sepsis Labs: No results for input(s): PROCALCITON, WBC, LATICACIDVEN in the last 168 hours.  Microbiology No results found for this or any previous visit (from the past 240 hour(s)).  Procedures and diagnostic studies:  No results found.             LOS: 62 days   [name]  [facility] 09/05/20 on [email or web address]. If 7PM-7AM, please contact  night-coverage at [email or web address]     07/08/2020, 12:33 PM

## 2020-07-09 NOTE — Progress Notes (Addendum)
Progress Note    TOUA STITES  [id number] DOB: 02/20/1971  DOA: 05/07/2020 PCP: Patient, No Pcp Per      Brief Narrative:    Medical records reviewed and are as summarized below:  Lance Ray is a 49 y.o. malewith medical history significant fordementia, agitation and seizure.  Hospitalist team was consulted by psychiatrist for recurrent seizures.    Patient initally admitted on 6/30 under hospitalist service for generalized tonic clonic seizuredue to non-compliance with Depakote.He was loaded with IV lorazepam and keppra and admitted to PCU.Had negative MRI brain and CT head. Hospital course was complicated by psychosis and agitation and required precedex gtt for agitation. Psychiatry wasconsulted and he wasultimatelydischarged to behavior health on 7/16 and was placed under involuntary commitment period.  While in the behavioral health unit, his sitter apparently heard the patient scream and witnessed patient have a generalized tonic-clonic seizure that lasted about 2 minutes. He was seen in consultation by the neurologist.   Patient does not have capacity to make decisions for himself.  He has been evaluated by the psychiatrist who said patient has no capacity to make decision for himself.  Patient is awaiting legal guardianship prior to discharge.  This is scheduled for July 13, 2020.       Assessment/Plan:   Principal Problem:   Seizure (HCC) Active Problems:   Psychosis (HCC)   Hypotension   Vitamin D deficiency   Impaired insight   Alcohol abuse   Body mass index is 21.31 kg/m.    PLAN  Continue antiepileptics Continue antipsychotics Continue vitamin B12 and vitamin D supplements Awaiting guardianship as needed scheduled for July 13, 2020.     Diet Order            Diet regular Room service appropriate? Yes; Fluid consistency: Thin  Diet effective now                     Consultants:  Psychiatrist  Neurologist  Procedures:  None    Medications:   . cholecalciferol  1,000 Units Oral Daily  . clonazePAM  0.5 mg Oral BID  . enoxaparin (LOVENOX) injection  40 mg Subcutaneous Q24H  . haloperidol  0.5 mg Oral BID  . lamoTRIgine  100 mg Oral BID   And  . lamoTRIgine  75 mg Oral BID  . levETIRAcetam  1,500 mg Oral BID  . melatonin  5 mg Oral QHS  . OLANZapine  5 mg Oral QHS  . vitamin B-6  50 mg Oral Daily  . sodium chloride flush  10 mL Intravenous Q12H  . thiamine  100 mg Oral Daily  . vitamin B-12  1,000 mcg Oral Daily   Continuous Infusions:   Anti-infectives (From admission, onward)   None             Family Communication/Anticipated D/C date and plan/Code Status   DVT prophylaxis: enoxaparin (LOVENOX) injection 40 mg Start: 05/08/20 0000     Code Status: Full Code  Family Communication: None Disposition Plan:    Status is: Inpatient  Remains inpatient appropriate because:Unsafe d/c plan   Dispo:  Patient From:  Home  Planned Disposition:  Group home  Expected discharge date:  To be determined  Medically stable for discharge:  Yes, but awaiting guardianship prior to discharge            Subjective:   No acute issues.  He has no complaints.  Interval events noted.  Objective:  Vitals:   07/08/20 1955 07/09/20 0005 07/09/20 0424 07/09/20 0728  BP: 106/67 117/70 103/69 105/68  Pulse: 93 81 69 62  Resp: 16 18 17 18   Temp: 98.8 F (37.1 C) 97.8 F (36.6 C) 97.8 F (36.6 C) 98 F (36.7 C)  TempSrc: Oral     SpO2: 96% 99% 97% 99%  Weight:      Height:       No data found.   Intake/Output Summary (Last 24 hours) at 07/09/2020 1405 Last data filed at 07/08/2020 1849 Gross per 24 hour  Intake 360 ml  Output --  Net 360 ml   Filed Weights   05/29/20 0500 05/30/20 0450 05/31/20 0452  Weight: 66.8 kg 64.3 kg 65.5 kg    Exam:  GEN: NAD SKIN: No rash EYES: EOMI ENT:  MMM CV: RRR PULM: CTA B ABD: soft, ND, NT, +BS CNS: AAO x 3, non focal EXT: No edema or tenderness     Data Reviewed:   I have personally reviewed following labs and imaging studies:  Labs: Labs show the following:   Basic Metabolic Panel: Recent Labs  Lab 07/06/20 0418  CREATININE 1.19   GFR Estimated Creatinine Clearance: 68.8 mL/min (by C-G formula based on SCr of 1.19 mg/dL). Liver Function Tests: No results for input(s): AST, ALT, ALKPHOS, BILITOT, PROT, ALBUMIN in the last 168 hours. No results for input(s): LIPASE, AMYLASE in the last 168 hours. No results for input(s): AMMONIA in the last 168 hours. Coagulation profile No results for input(s): INR, PROTIME in the last 168 hours.  CBC: No results for input(s): WBC, NEUTROABS, HGB, HCT, MCV, PLT in the last 168 hours. Cardiac Enzymes: No results for input(s): CKTOTAL, CKMB, CKMBINDEX, TROPONINI in the last 168 hours. BNP (last 3 results) No results for input(s): PROBNP in the last 8760 hours. CBG: No results for input(s): GLUCAP in the last 168 hours. D-Dimer: No results for input(s): DDIMER in the last 72 hours. Hgb A1c: No results for input(s): HGBA1C in the last 72 hours. Lipid Profile: No results for input(s): CHOL, HDL, LDLCALC, TRIG, CHOLHDL, LDLDIRECT in the last 72 hours. Thyroid function studies: No results for input(s): TSH, T4TOTAL, T3FREE, THYROIDAB in the last 72 hours.  Invalid input(s): FREET3 Anemia work up: No results for input(s): VITAMINB12, FOLATE, FERRITIN, TIBC, IRON, RETICCTPCT in the last 72 hours. Sepsis Labs: No results for input(s): PROCALCITON, WBC, LATICACIDVEN in the last 168 hours.  Microbiology No results found for this or any previous visit (from the past 240 hour(s)).  Procedures and diagnostic studies:  No results found.             LOS: 63 days   Belenda Alviar  [facility] 09/05/20 on [email or web address]. If 7PM-7AM, please contact night-coverage at  [email or web address]     07/09/2020, 2:05 PM

## 2020-07-09 NOTE — Plan of Care (Signed)
  Problem: Coping: Goal: Ability to adjust to condition or change in health will improve Outcome: Progressing Goal: Ability to identify appropriate support needs will improve Outcome: Progressing   Problem: Health Behavior/Discharge Planning: Goal: Compliance with prescribed medication regimen will improve Outcome: Progressing   Problem: Safety: Goal: Verbalization of understanding the information provided will improve Outcome: Progressing   Problem: Self-Concept: Goal: Ability to verbalize feelings about condition will improve Outcome: Progressing

## 2020-07-10 LAB — CBC
HCT: 40 % (ref 39.0–52.0)
Hemoglobin: 13.4 g/dL (ref 13.0–17.0)
MCH: 28.9 pg (ref 26.0–34.0)
MCHC: 33.5 g/dL (ref 30.0–36.0)
MCV: 86.2 fL (ref 80.0–100.0)
Platelets: 249 10*3/uL (ref 150–400)
RBC: 4.64 MIL/uL (ref 4.22–5.81)
RDW: 13.1 % (ref 11.5–15.5)
WBC: 6.5 10*3/uL (ref 4.0–10.5)
nRBC: 0 % (ref 0.0–0.2)

## 2020-07-10 NOTE — Progress Notes (Signed)
Progress Note    Lance Ray  [id number] DOB: [birth year]  DOA: 05/07/2020 PCP: Patient, No Pcp Per      Brief Narrative:    Medical records reviewed and are as summarized below:  Lance Ray is a 49 y.o. malewith medical history significant fordementia, agitation and seizure.  Hospitalist team was consulted by psychiatrist for recurrent seizures.    Patient initally admitted on 6/30 under hospitalist service for generalized tonic clonic seizuredue to non-compliance with Depakote.He was loaded with IV lorazepam and keppra and admitted to PCU.Had negative MRI brain and CT head. Hospital course was complicated by psychosis and agitation and required precedex gtt for agitation. Psychiatry wasconsulted and he wasultimatelydischarged to behavior health on 7/16 and was placed under involuntary commitment period.  While in the behavioral health unit, his sitter apparently heard the patient scream and witnessed patient have a generalized tonic-clonic seizure that lasted about 2 minutes. He was seen in consultation by the neurologist.   Patient does not have capacity to make decisions for himself.  He has been evaluated by the psychiatrist who said patient has no capacity to make decision for himself.  Patient is awaiting legal guardianship prior to discharge.  This is scheduled for July 13, 2020.       Assessment/Plan:   Principal Problem:   Seizure (HCC) Active Problems:   Psychosis (HCC)   Hypotension   Vitamin D deficiency   Impaired insight   Alcohol abuse   Body mass index is 21.31 kg/m.    PLAN  Continue antiepileptics Continue antipsychotics Continue vitamin B12 and vitamin D supplements Awaiting guardianship hearing scheduled for October 19.     Diet Order            Diet regular Room service appropriate? Yes; Fluid consistency: Thin  Diet effective now                     Consultants:  Psychiatrist  Neurologist  Procedures:  None    Medications:   . cholecalciferol  1,000 Units Oral Daily  . clonazePAM  0.5 mg Oral BID  . enoxaparin (LOVENOX) injection  40 mg Subcutaneous Q24H  . haloperidol  0.5 mg Oral BID  . lamoTRIgine  100 mg Oral BID   And  . lamoTRIgine  75 mg Oral BID  . levETIRAcetam  1,500 mg Oral BID  . melatonin  5 mg Oral QHS  . OLANZapine  5 mg Oral QHS  . vitamin B-6  50 mg Oral Daily  . sodium chloride flush  10 mL Intravenous Q12H  . thiamine  100 mg Oral Daily  . vitamin B-12  1,000 mcg Oral Daily   Continuous Infusions:   Anti-infectives (From admission, onward)   None             Family Communication/Anticipated D/C date and plan/Code Status   DVT prophylaxis: enoxaparin (LOVENOX) injection 40 mg Start: 05/08/20 0000     Code Status: Full Code  Family Communication: None Disposition Plan:    Status is: Inpatient  Remains inpatient appropriate because:Unsafe d/c plan   Dispo:  Patient From:  Home  Planned Disposition:  Group home  Expected discharge date:  To be determined  Medically stable for discharge:  Yes, but awaiting guardianship prior to discharge            Subjective:   No acute issues overnight.  He has no complaints.  He feels okay..  Objective:  Vitals:   07/09/20 2357 07/10/20 0413 07/10/20 0732 07/10/20 1121  BP: 104/72 101/81 107/78 110/77  Pulse: 77 74 71 78  Resp: 18 18 16 16   Temp: 98.3 F (36.8 C) 97.8 F (36.6 C) 97.8 F (36.6 C) 98.4 F (36.9 C)  TempSrc: Oral Oral Oral Oral  SpO2: 96% 96% 95% 98%  Weight:      Height:       No data found.   Intake/Output Summary (Last 24 hours) at 07/10/2020 1131 Last data filed at 07/10/2020 1020 Gross per 24 hour  Intake 240 ml  Output --  Net 240 ml   Filed Weights   05/29/20 0500 05/30/20 0450 05/31/20 0452  Weight: 66.8 kg 64.3 kg 65.5 kg    Exam:  GEN: NAD SKIN: No  rash EYES: No pallor or icterus ENT: MMM CV: RRR PULM: CTA B ABD: soft, ND, NT, +BS CNS: AAO x 3, non focal EXT: No edema or tenderness     Data Reviewed:   I have personally reviewed following labs and imaging studies:  Labs: Labs show the following:   Basic Metabolic Panel: Recent Labs  Lab 07/06/20 0418  CREATININE 1.19   GFR Estimated Creatinine Clearance: 68.8 mL/min (by C-G formula based on SCr of 1.19 mg/dL). Liver Function Tests: No results for input(s): AST, ALT, ALKPHOS, BILITOT, PROT, ALBUMIN in the last 168 hours. No results for input(s): LIPASE, AMYLASE in the last 168 hours. No results for input(s): AMMONIA in the last 168 hours. Coagulation profile No results for input(s): INR, PROTIME in the last 168 hours.  CBC: Recent Labs  Lab 07/10/20 0429  WBC 6.5  HGB 13.4  HCT 40.0  MCV 86.2  PLT 249   Cardiac Enzymes: No results for input(s): CKTOTAL, CKMB, CKMBINDEX, TROPONINI in the last 168 hours. BNP (last 3 results) No results for input(s): PROBNP in the last 8760 hours. CBG: No results for input(s): GLUCAP in the last 168 hours. D-Dimer: No results for input(s): DDIMER in the last 72 hours. Hgb A1c: No results for input(s): HGBA1C in the last 72 hours. Lipid Profile: No results for input(s): CHOL, HDL, LDLCALC, TRIG, CHOLHDL, LDLDIRECT in the last 72 hours. Thyroid function studies: No results for input(s): TSH, T4TOTAL, T3FREE, THYROIDAB in the last 72 hours.  Invalid input(s): FREET3 Anemia work up: No results for input(s): VITAMINB12, FOLATE, FERRITIN, TIBC, IRON, RETICCTPCT in the last 72 hours. Sepsis Labs: Recent Labs  Lab 07/10/20 0429  WBC 6.5    Microbiology No results found for this or any previous visit (from the past 240 hour(s)).  Procedures and diagnostic studies:  No results found.             LOS: 64 days   Lance Ray  [facility] 07/12/20 on [email or web address]. If 7PM-7AM, please contact  night-coverage at [email or web address]     07/10/2020, 11:31 AM

## 2020-07-11 NOTE — Progress Notes (Signed)
Progress Note    Lance Ray  [id number] DOB: 1971/08/15  DOA: 05/07/2020 PCP: Patient, No Pcp Per      Brief Narrative:    Medical records reviewed and are as summarized below:  Lance Ray is a 49 y.o. malewith medical history significant fordementia, agitation and seizure.  Hospitalist team was consulted by psychiatrist for recurrent seizures.    Patient initally admitted on 6/30 under hospitalist service for generalized tonic clonic seizuredue to non-compliance with Depakote.He was loaded with IV lorazepam and keppra and admitted to PCU.Had negative MRI brain and CT head. Hospital course was complicated by psychosis and agitation and required precedex gtt for agitation. Psychiatry wasconsulted and he wasultimatelydischarged to behavior health on 7/16 and was placed under involuntary commitment period.  While in the behavioral health unit, his sitter apparently heard the patient scream and witnessed patient have a generalized tonic-clonic seizure that lasted about 2 minutes. He was seen in consultation by the neurologist.   Patient does not have capacity to make decisions for himself.  He has been evaluated by the psychiatrist who said patient has no capacity to make decision for himself.  Patient is awaiting legal guardianship prior to discharge.  This is scheduled for July 13, 2020.       Assessment/Plan:   Principal Problem:   Seizure (HCC) Active Problems:   Psychosis (HCC)   Hypotension   Vitamin D deficiency   Impaired insight   Alcohol abuse   Body mass index is 21.31 kg/m.    PLAN  Continue antipsychotics Continue antiepileptics Continue vitamin D vitamin B12 supplements Awaiting guardianship hearing scheduled for July 13, 2020.     Diet Order            Diet regular Room service appropriate? Yes; Fluid consistency: Thin  Diet effective now                     Consultants:  Psychiatrist  Neurologist  Procedures:  None    Medications:   . cholecalciferol  1,000 Units Oral Daily  . clonazePAM  0.5 mg Oral BID  . enoxaparin (LOVENOX) injection  40 mg Subcutaneous Q24H  . haloperidol  0.5 mg Oral BID  . lamoTRIgine  100 mg Oral BID   And  . lamoTRIgine  75 mg Oral BID  . levETIRAcetam  1,500 mg Oral BID  . melatonin  5 mg Oral QHS  . OLANZapine  5 mg Oral QHS  . vitamin B-6  50 mg Oral Daily  . sodium chloride flush  10 mL Intravenous Q12H  . thiamine  100 mg Oral Daily  . vitamin B-12  1,000 mcg Oral Daily   Continuous Infusions:   Anti-infectives (From admission, onward)   None             Family Communication/Anticipated D/C date and plan/Code Status   DVT prophylaxis: enoxaparin (LOVENOX) injection 40 mg Start: 05/08/20 0000     Code Status: Full Code  Family Communication: None Disposition Plan:    Status is: Inpatient  Remains inpatient appropriate because:Unsafe d/c plan   Dispo:  Patient From:  Home  Planned Disposition:  Group home  Expected discharge date:  To be determined  Medically stable for discharge:  Yes, but awaiting guardianship prior to discharge            Subjective:   Interval events noted. He said he feels tired but otherwise he is okay  Objective:  Vitals:   07/10/20 2026 07/10/20 2347 07/11/20 0431 07/11/20 0746  BP: 112/76 105/70 105/79 100/71  Pulse: 91 86 71 64  Resp: 16 16 16 16   Temp: 98.1 F (36.7 C) 98.1 F (36.7 C) 97.7 F (36.5 C) 97.6 F (36.4 C)  TempSrc: Oral Oral    SpO2: 97% 96% 96% 97%  Weight:      Height:       No data found.   Intake/Output Summary (Last 24 hours) at 07/11/2020 1045 Last data filed at 07/10/2020 1846 Gross per 24 hour  Intake 720 ml  Output --  Net 720 ml   Filed Weights   05/29/20 0500 05/30/20 0450 05/31/20 0452  Weight: 66.8 kg 64.3 kg 65.5 kg    Exam:  GEN: NAD SKIN: No rash EYES: No  pallor or icterus ENT: MMM CV: RRR PULM: CTA B ABD: soft, ND, NT, +BS CNS: AAO x 3, non focal EXT: No edema or tenderness     Data Reviewed:   I have personally reviewed following labs and imaging studies:  Labs: Labs show the following:   Basic Metabolic Panel: Recent Labs  Lab 07/06/20 0418  CREATININE 1.19   GFR Estimated Creatinine Clearance: 68.8 mL/min (by C-G formula based on SCr of 1.19 mg/dL). Liver Function Tests: No results for input(s): AST, ALT, ALKPHOS, BILITOT, PROT, ALBUMIN in the last 168 hours. No results for input(s): LIPASE, AMYLASE in the last 168 hours. No results for input(s): AMMONIA in the last 168 hours. Coagulation profile No results for input(s): INR, PROTIME in the last 168 hours.  CBC: Recent Labs  Lab 07/10/20 0429  WBC 6.5  HGB 13.4  HCT 40.0  MCV 86.2  PLT 249   Cardiac Enzymes: No results for input(s): CKTOTAL, CKMB, CKMBINDEX, TROPONINI in the last 168 hours. BNP (last 3 results) No results for input(s): PROBNP in the last 8760 hours. CBG: No results for input(s): GLUCAP in the last 168 hours. D-Dimer: No results for input(s): DDIMER in the last 72 hours. Hgb A1c: No results for input(s): HGBA1C in the last 72 hours. Lipid Profile: No results for input(s): CHOL, HDL, LDLCALC, TRIG, CHOLHDL, LDLDIRECT in the last 72 hours. Thyroid function studies: No results for input(s): TSH, T4TOTAL, T3FREE, THYROIDAB in the last 72 hours.  Invalid input(s): FREET3 Anemia work up: No results for input(s): VITAMINB12, FOLATE, FERRITIN, TIBC, IRON, RETICCTPCT in the last 72 hours. Sepsis Labs: Recent Labs  Lab 07/10/20 0429  WBC 6.5    Microbiology No results found for this or any previous visit (from the past 240 hour(s)).  Procedures and diagnostic studies:  No results found.             LOS: 65 days   Belisa Eichholz  [facility] 07/12/20 on [email or web address]. If 7PM-7AM, please contact night-coverage at  [email or web address]     07/11/2020, 10:45 AM

## 2020-07-12 NOTE — Progress Notes (Signed)
Progress Note    Lance Ray  KGU:[id number] DOB: 1970/12/28  DOA: 05/07/2020 PCP: Patient, No Pcp Per      Brief Narrative:    Medical records reviewed and are as summarized below:  Lance Ray is a 49 y.o. malewith medical history significant fordementia, agitation and seizure.  Hospitalist team was consulted by psychiatrist for recurrent seizures.    Patient initally admitted on 6/30 under hospitalist service for generalized tonic clonic seizuredue to non-compliance with Depakote.He was loaded with IV lorazepam and keppra and admitted to PCU.Had negative MRI brain and CT head. Hospital course was complicated by psychosis and agitation and required precedex gtt for agitation. Psychiatry wasconsulted and he wasultimatelydischarged to behavior health on 7/16 and was placed under involuntary commitment period.  While in the behavioral health unit, his sitter apparently heard the patient scream and witnessed patient have a generalized tonic-clonic seizure that lasted about 2 minutes. He was seen in consultation by the neurologist.   Patient does not have capacity to make decisions for himself.  He has been evaluated by the psychiatrist who said patient has no capacity to make decision for himself.  Patient is awaiting legal guardianship prior to discharge.  This is scheduled for July 13, 2020.       Assessment/Plan:   Principal Problem:   Seizure (HCC) Active Problems:   Psychosis (HCC)   Hypotension   Vitamin D deficiency   Impaired insight   Alcohol abuse   Body mass index is 21.31 kg/m.    PLAN   Continue current medications Awaiting guardianship hearing scheduled for tomorrow.    Diet Order            Diet regular Room service appropriate? Yes; Fluid consistency: Thin  Diet effective now                    Consultants:  Psychiatrist  Neurologist  Procedures:  None    Medications:   . cholecalciferol  1,000  Units Oral Daily  . clonazePAM  0.5 mg Oral BID  . enoxaparin (LOVENOX) injection  40 mg Subcutaneous Q24H  . haloperidol  0.5 mg Oral BID  . lamoTRIgine  100 mg Oral BID   And  . lamoTRIgine  75 mg Oral BID  . levETIRAcetam  1,500 mg Oral BID  . melatonin  5 mg Oral QHS  . OLANZapine  5 mg Oral QHS  . vitamin B-6  50 mg Oral Daily  . sodium chloride flush  10 mL Intravenous Q12H  . thiamine  100 mg Oral Daily  . vitamin B-12  1,000 mcg Oral Daily   Continuous Infusions:   Anti-infectives (From admission, onward)   None             Family Communication/Anticipated D/C date and plan/Code Status   DVT prophylaxis: enoxaparin (LOVENOX) injection 40 mg Start: 05/08/20 0000     Code Status: Full Code  Family Communication: None Disposition Plan:    Status is: Inpatient  Remains inpatient appropriate because:Unsafe d/c plan   Dispo:  Patient From:  Home  Planned Disposition:  Group home  Expected discharge date:  To be determined  Medically stable for discharge:  Yes, but awaiting guardianship prior to discharge            Subjective:   No complaints.  He feels okay.  Objective:    Vitals:   07/11/20 2007 07/11/20 2310 07/12/20 0355 07/12/20 0736  BP: 111/77 110/72  102/62 106/65  Pulse: 89 88 75 72  Resp: 18 18 18 16   Temp: 98.6 F (37 C) 98.7 F (37.1 C) 97.9 F (36.6 C) 98.6 F (37 C)  TempSrc: Oral Oral Oral Oral  SpO2: 97% 96% 96% 96%  Weight:      Height:       No data found.   Intake/Output Summary (Last 24 hours) at 07/12/2020 1352 Last data filed at 07/12/2020 1000 Gross per 24 hour  Intake 600 ml  Output --  Net 600 ml   Filed Weights   05/29/20 0500 05/30/20 0450 05/31/20 0452  Weight: 66.8 kg 64.3 kg 65.5 kg    Exam:  GEN: NAD SKIN: No rash EYES: No pallor or icterus ENT: MMM CV: RRR PULM: CTA B ABD: soft, ND, NT, +BS CNS: AAO x 3, non focal EXT: No edema or tenderness     Data Reviewed:   I have  personally reviewed following labs and imaging studies:  Labs: Labs show the following:   Basic Metabolic Panel: Recent Labs  Lab 07/06/20 0418  CREATININE 1.19   GFR Estimated Creatinine Clearance: 68.8 mL/min (by C-G formula based on SCr of 1.19 mg/dL). Liver Function Tests: No results for input(s): AST, ALT, ALKPHOS, BILITOT, PROT, ALBUMIN in the last 168 hours. No results for input(s): LIPASE, AMYLASE in the last 168 hours. No results for input(s): AMMONIA in the last 168 hours. Coagulation profile No results for input(s): INR, PROTIME in the last 168 hours.  CBC: Recent Labs  Lab 07/10/20 0429  WBC 6.5  HGB 13.4  HCT 40.0  MCV 86.2  PLT 249   Cardiac Enzymes: No results for input(s): CKTOTAL, CKMB, CKMBINDEX, TROPONINI in the last 168 hours. BNP (last 3 results) No results for input(s): PROBNP in the last 8760 hours. CBG: No results for input(s): GLUCAP in the last 168 hours. D-Dimer: No results for input(s): DDIMER in the last 72 hours. Hgb A1c: No results for input(s): HGBA1C in the last 72 hours. Lipid Profile: No results for input(s): CHOL, HDL, LDLCALC, TRIG, CHOLHDL, LDLDIRECT in the last 72 hours. Thyroid function studies: No results for input(s): TSH, T4TOTAL, T3FREE, THYROIDAB in the last 72 hours.  Invalid input(s): FREET3 Anemia work up: No results for input(s): VITAMINB12, FOLATE, FERRITIN, TIBC, IRON, RETICCTPCT in the last 72 hours. Sepsis Labs: Recent Labs  Lab 07/10/20 0429  WBC 6.5    Microbiology No results found for this or any previous visit (from the past 240 hour(s)).  Procedures and diagnostic studies:  No results found.             LOS: 66 days   Kortni Hasten  [facility] 07/12/20 on [email or web address]. If 7PM-7AM, please contact night-coverage at [email or web address]     07/12/2020, 1:52 PM

## 2020-07-12 NOTE — Plan of Care (Signed)
  Problem: Education: Goal: Knowledge of General Education information will improve Description Including pain rating scale, medication(s)/side effects and non-pharmacologic comfort measures Outcome: Progressing   

## 2020-07-13 NOTE — Progress Notes (Signed)
Progress Note    EMANUAL LAMOUNTAIN  UXN:[id number] DOB: 1971/01/03  DOA: 05/07/2020 PCP: Patient, No Pcp Per      Brief Narrative:    Medical records reviewed and are as summarized below:  Lance Ray is a 49 y.o. malewith medical history significant fordementia, agitation and seizure.  Hospitalist team was consulted by psychiatrist for recurrent seizures.    Patient initally admitted on 6/30 under hospitalist service for generalized tonic clonic seizuredue to non-compliance with Depakote.He was loaded with IV lorazepam and keppra and admitted to PCU.Had negative MRI brain and CT head. Hospital course was complicated by psychosis and agitation and required precedex gtt for agitation. Psychiatry wasconsulted and he wasultimatelydischarged to behavior health on 7/16 and was placed under involuntary commitment period.  While in the behavioral health unit, his sitter apparently heard the patient scream and witnessed patient have a generalized tonic-clonic seizure that lasted about 2 minutes. He was seen in consultation by the neurologist.   Patient does not have capacity to make decisions for himself.  He has been evaluated by the psychiatrist who said patient has no capacity to make decision for himself.  Patient is awaiting legal guardianship prior to discharge.  This is scheduled for July 13, 2020.       Assessment/Plan:   Principal Problem:   Seizure (HCC) Active Problems:   Psychosis (HCC)   Hypotension   Vitamin D deficiency   Impaired insight   Alcohol abuse   Body mass index is 21.31 kg/m.    PLAN  Continue antiepileptics Continue psychotropics Continue vitamin B12 and vitamin D supplements vitamin B12 and vitamin D deficiency respectively Mr. Lorita Officer, state appointed attorney for Mr. Tienda, requested to speak to me today.  Diagnoses and hospital course were discussed.    Diet Order            Diet regular Room service  appropriate? Yes; Fluid consistency: Thin  Diet effective now                    Consultants:  Psychiatrist  Neurologist  Procedures:  None    Medications:   . cholecalciferol  1,000 Units Oral Daily  . clonazePAM  0.5 mg Oral BID  . enoxaparin (LOVENOX) injection  40 mg Subcutaneous Q24H  . haloperidol  0.5 mg Oral BID  . lamoTRIgine  100 mg Oral BID   And  . lamoTRIgine  75 mg Oral BID  . levETIRAcetam  1,500 mg Oral BID  . melatonin  5 mg Oral QHS  . OLANZapine  5 mg Oral QHS  . vitamin B-6  50 mg Oral Daily  . sodium chloride flush  10 mL Intravenous Q12H  . thiamine  100 mg Oral Daily  . vitamin B-12  1,000 mcg Oral Daily   Continuous Infusions:   Anti-infectives (From admission, onward)   None             Family Communication/Anticipated D/C date and plan/Code Status   DVT prophylaxis: enoxaparin (LOVENOX) injection 40 mg Start: 05/08/20 0000     Code Status: Full Code  Family Communication: None Disposition Plan:    Status is: Inpatient  Remains inpatient appropriate because:Unsafe d/c plan   Dispo:  Patient From:  Home  Planned Disposition:  Group home  Expected discharge date:  To be determined  Medically stable for discharge:  Yes, but awaiting guardianship prior to discharge  Subjective:   Interval events noted.  No complaints.  Objective:    Vitals:   07/12/20 2308 07/13/20 0406 07/13/20 0910 07/13/20 1053  BP: 111/79 99/66 107/69 (!) 94/58  Pulse: 84 69 62 69  Resp: 18 18 15 16   Temp: 98.1 F (36.7 C) 98.3 F (36.8 C) (!) 97.5 F (36.4 C) 97.8 F (36.6 C)  TempSrc:  Oral Oral Oral  SpO2: 96% 96% 97% 98%  Weight:      Height:       No data found.   Intake/Output Summary (Last 24 hours) at 07/13/2020 1240 Last data filed at 07/13/2020 1048 Gross per 24 hour  Intake 840 ml  Output --  Net 840 ml   Filed Weights   05/29/20 0500 05/30/20 0450 05/31/20 0452  Weight: 66.8 kg 64.3 kg  65.5 kg    Exam:  GEN: NAD SKIN: No rash EYES: EOMI ENT: MMM CV: RRR PULM: CTA B ABD: soft, ND, NT, +BS CNS: AAO x 3, non focal EXT: No edema or tenderness PSYCH: Flat affect.      Data Reviewed:   I have personally reviewed following labs and imaging studies:  Labs: Labs show the following:   Basic Metabolic Panel: No results for input(s): NA, K, CL, CO2, GLUCOSE, BUN, CREATININE, CALCIUM, MG, PHOS in the last 168 hours. GFR Estimated Creatinine Clearance: 68.8 mL/min (by C-G formula based on SCr of 1.19 mg/dL). Liver Function Tests: No results for input(s): AST, ALT, ALKPHOS, BILITOT, PROT, ALBUMIN in the last 168 hours. No results for input(s): LIPASE, AMYLASE in the last 168 hours. No results for input(s): AMMONIA in the last 168 hours. Coagulation profile No results for input(s): INR, PROTIME in the last 168 hours.  CBC: Recent Labs  Lab 07/10/20 0429  WBC 6.5  HGB 13.4  HCT 40.0  MCV 86.2  PLT 249   Cardiac Enzymes: No results for input(s): CKTOTAL, CKMB, CKMBINDEX, TROPONINI in the last 168 hours. BNP (last 3 results) No results for input(s): PROBNP in the last 8760 hours. CBG: No results for input(s): GLUCAP in the last 168 hours. D-Dimer: No results for input(s): DDIMER in the last 72 hours. Hgb A1c: No results for input(s): HGBA1C in the last 72 hours. Lipid Profile: No results for input(s): CHOL, HDL, LDLCALC, TRIG, CHOLHDL, LDLDIRECT in the last 72 hours. Thyroid function studies: No results for input(s): TSH, T4TOTAL, T3FREE, THYROIDAB in the last 72 hours.  Invalid input(s): FREET3 Anemia work up: No results for input(s): VITAMINB12, FOLATE, FERRITIN, TIBC, IRON, RETICCTPCT in the last 72 hours. Sepsis Labs: Recent Labs  Lab 07/10/20 0429  WBC 6.5    Microbiology No results found for this or any previous visit (from the past 240 hour(s)).  Procedures and diagnostic studies:  No results found.             LOS: 67  days   Amayrani Bennick  [facility] 07/12/20 on [email or web address]. If 7PM-7AM, please contact night-coverage at [email or web address]     07/13/2020, 12:40 PM

## 2020-07-14 NOTE — Progress Notes (Signed)
PROGRESS NOTE  Lance Ray [id number]:[id number] DOB: 1970/10/30 DOA: 05/07/2020 PCP: Patient, No Pcp Per   LOS: 68 days   Brief narrative: As per HPI and review of records,  Lance Ray is a 49 y.o. malewith medical history significant fordementia, agitation and seizure was admitted hospital as a consultation from psychiatry for recurrent seizures.Patient was initally admitted on 6/30 under hospitalist service for generalized tonic clonic seizuredue to non-compliance with Depakote.He was loaded with IV lorazepam and keppra and admitted to PCU.Had negative MRI brain and CT head. Hospital course was complicated by psychosis and agitation and required precedex drip for agitation. Psychiatry wasconsulted and he wasultimatelydischarged to behavior health on 7/16 and wasplaced under involuntary commitmentperiod. While in the behavioral health unit, his sitter apparently heard the patient scream and witnessed patient have a generalized tonic-clonic seizurethat lastedabout 2 minutes was brought back to inpatient unit. Patient does not have capacity to make decisions for himself. He has been evaluated by thepsychiatrist who said patient has no capacity to make decision for himself.Patient is awaiting legal guardianship prior to discharge.     Assessment/Plan:  Principal Problem:   Seizure (HCC) Active Problems:   Psychosis (HCC)   Hypotension   Vitamin D deficiency   Impaired insight   Alcohol abuse  History of recurrent seizures.  Likely secondary to noncompliance.  Currently on lamotrigine Keppra and Klonopin.  No further seizures noted.  History of psychosis with agitation.  Continue Haldol Lamictal Klonopin, olanzapine.  Has been seen by psychiatry during hospitalization.  Currently calm and composed  Vitamin B12 and vitamin D deficiency.  Continue supplements.  Continue pyridoxine thiamine vitamin B12 and vitamin D.  DVT prophylaxis: enoxaparin (LOVENOX) injection 40  mg Start: 05/08/20 0000   Code Status: Full code  Family Communication: None  Status is: Inpatient  Remains inpatient appropriate because:Unsafe d/c plan and Awaiting for legal guardianship and placement   Dispo:  Patient From: Home  Planned Disposition: Undetermined at this time.  Likely to group home  Expected discharge date: Unknown at this time  Medically stable for discharge: Yes  Consultants:  Psychiatry  Neurology   Procedures:  None  Antibiotics:  . None  Anti-infectives (From admission, onward)   None      Subjective: Today, patient was seen and examined at bedside.  Has not had vomiting fever chills or rigor.  Does not want to be bothered.  Objective: Vitals:   07/14/20 0829 07/14/20 1113  BP: 100/63 96/72  Pulse: 66 74  Resp: 16 16  Temp: 97.8 F (36.6 C) 98 F (36.7 C)  SpO2: 97% 96%    Intake/Output Summary (Last 24 hours) at 07/14/2020 1150 Last data filed at 07/14/2020 0900 Gross per 24 hour  Intake 480 ml  Output --  Net 480 ml   Filed Weights   05/29/20 0500 05/30/20 0450 05/31/20 0452  Weight: 66.8 kg 64.3 kg 65.5 kg   Body mass index is 21.31 kg/m.   Physical Exam:  GENERAL: Patient is alert awake and communicative not in obvious distress.  Thinly built HENT: No scleral pallor or icterus. Pupils equally reactive to light. Oral mucosa is moist NECK: is supple, no gross swelling noted. CHEST: Clear to auscultation. No crackles or wheezes.  Diminished breath sounds bilaterally. CVS: S1 and S2 heard, no murmur. Regular rate and rhythm.  ABDOMEN: Soft, non-tender, bowel sounds are present. EXTREMITIES: No edema. CNS: Cranial nerves are intact. No focal motor deficits. SKIN: warm and dry without rashes.  Data Review: I have personally reviewed the following laboratory data and studies,  CBC: Recent Labs  Lab 07/10/20 0429  WBC 6.5  HGB 13.4  HCT 40.0  MCV 86.2  PLT 249   Basic Metabolic Panel: No results for  input(s): NA, K, CL, CO2, GLUCOSE, BUN, CREATININE, CALCIUM, MG, PHOS in the last 168 hours. Liver Function Tests: No results for input(s): AST, ALT, ALKPHOS, BILITOT, PROT, ALBUMIN in the last 168 hours. No results for input(s): LIPASE, AMYLASE in the last 168 hours. No results for input(s): AMMONIA in the last 168 hours. Cardiac Enzymes: No results for input(s): CKTOTAL, CKMB, CKMBINDEX, TROPONINI in the last 168 hours. BNP (last 3 results) No results for input(s): BNP in the last 8760 hours.  ProBNP (last 3 results) No results for input(s): PROBNP in the last 8760 hours.  CBG: No results for input(s): GLUCAP in the last 168 hours. No results found for this or any previous visit (from the past 240 hour(s)).   Studies: No results found.    Joycelyn Das, MD  [facility] 07/14/2020

## 2020-07-14 NOTE — TOC Progression Note (Signed)
Transition of Care [facility]) - Progression Note    Patient Details  Name: Lance Ray MRN: [id number] Date of Birth: June 17, 1971  [facility] CM/SW Contact  Trenton Founds, RN Phone Number: 07/14/2020, 9:44 AM  Clinical Narrative:   RNCM reached out to patient's sister Lance Ray to discuss guardianship hearing. Lance Ray reports that she was rewarded guardianship of patient but that she has to wait until the paperwork is completed by the clerk of court. She reports that she had to wait on this hearing to continue to proceed with patient's Medicaid but that she is working on it now as well as calling other facilities. She reports that she has previously be in contact with [facility] with [facility].   RNCM e-mailed Lance Ray an additional list of facilities in the county as well as reaching out to [place] with [employer] to see if he has availability.          Expected Discharge Plan and Services                                                 Social Determinants of Health (SDOH) Interventions    Readmission Risk Interventions Readmission Risk Prevention Plan 03/29/2020  Transportation Screening Complete  HRI or Home Care Consult Complete  Social Work Consult for Recovery Care Planning/Counseling Complete  Palliative Care Screening Not Applicable  Medication Review [facility]) Referral to Pharmacy  Some recent data might be hidden

## 2020-07-15 NOTE — Progress Notes (Signed)
PROGRESS NOTE  Lance Ray [id number]:[id number] DOB: 1970/10/05 DOA: 05/07/2020 PCP: Patient, No Pcp Per   LOS: 69 days   Brief narrative: As per HPI and review of records,  Lance Ray is a 49 y.o. malewith medical history significant fordementia, agitation and seizure was admitted hospital as a consultation from psychiatry for recurrent seizures.Patient was initally admitted on 6/30 under hospitalist service for generalized tonic clonic seizuredue to non-compliance with Depakote.He was loaded with IV lorazepam and keppra and admitted to PCU.Had negative MRI brain and CT head. Hospital course was complicated by psychosis and agitation and required precedex drip for agitation. Psychiatry wasconsulted and he wasultimatelydischarged to behavior health on 7/16 and wasplaced under involuntary commitmentperiod. While in the behavioral health unit, his sitter apparently heard the patient scream and witnessed patient have a generalized tonic-clonic seizurethat lastedabout 2 minutes was brought back to inpatient unit. Patient does not have capacity to make decisions for himself. He has been evaluated by thepsychiatrist who said patient has no capacity to make decision for himself.Patient is awaiting legal guardianship prior to discharge.     Assessment/Plan:  Principal Problem:   Seizure (HCC) Active Problems:   Psychosis (HCC)   Hypotension   Vitamin D deficiency   Impaired insight   Alcohol abuse  History of recurrent seizures.  Likely secondary to noncompliance.  Currently on lamotrigine, Keppra and Klonopin.  No further seizures noted.  History of psychosis with agitation.  Continue Haldol, Lamictal Klonopin, olanzapine.  Has been seen by psychiatry during hospitalization.  No active issues at this time.  Vitamin B12 and vitamin D deficiency.    Continue pyridoxine, thiamine vitamin B12 and vitamin D.  DVT prophylaxis: enoxaparin (LOVENOX) injection 40 mg Start:  05/08/20 0000   Code Status: Full code  Family Communication: None  Status is: Inpatient  Remains inpatient appropriate because:Unsafe d/c plan and Awaiting for legal guardianship and placement   Dispo:  Patient From: Home  Planned Disposition: Undetermined at this time.  Likely to group home  Expected discharge date: Unknown at this time  Medically stable for discharge: Yes  Consultants:  Psychiatry  Neurology   Procedures:  None  Antibiotics:  . None  Anti-infectives (From admission, onward)   None      Subjective: Today, patient was seen and examined at bedside.  Patient denies any nausea, vomiting, fever, chills, rigors or pain.  Objective: Vitals:   07/15/20 0350 07/15/20 0749  BP: 120/85 108/79  Pulse: 68 70  Resp: 18 16  Temp: 98.1 F (36.7 C) (!) 97.4 F (36.3 C)  SpO2: 96% 98%    Intake/Output Summary (Last 24 hours) at 07/15/2020 0940 Last data filed at 07/14/2020 1900 Gross per 24 hour  Intake 240 ml  Output --  Net 240 ml   Filed Weights   05/29/20 0500 05/30/20 0450 05/31/20 0452  Weight: 66.8 kg 64.3 kg 65.5 kg   Body mass index is 21.31 kg/m.   Physical Exam:  GENERAL: Patient is alert awake and communicative not in obvious distress.  Thinly built HENT: No scleral pallor or icterus. Pupils equally reactive to light. Oral mucosa is moist NECK: is supple, no gross swelling noted. CHEST: Clear to auscultation. No crackles or wheezes.  Diminished breath sounds bilaterally. CVS: S1 and S2 heard, no murmur. Regular rate and rhythm.  ABDOMEN: Soft, non-tender, bowel sounds are present. EXTREMITIES: No edema. CNS: Cranial nerves are intact. No focal motor deficits. SKIN: warm and dry without rashes.  Data Review: I  have personally reviewed the following laboratory data and studies,  CBC: Recent Labs  Lab 07/10/20 0429  WBC 6.5  HGB 13.4  HCT 40.0  MCV 86.2  PLT 249   Basic Metabolic Panel: No results for input(s): NA,  K, CL, CO2, GLUCOSE, BUN, CREATININE, CALCIUM, MG, PHOS in the last 168 hours. Liver Function Tests: No results for input(s): AST, ALT, ALKPHOS, BILITOT, PROT, ALBUMIN in the last 168 hours. No results for input(s): LIPASE, AMYLASE in the last 168 hours. No results for input(s): AMMONIA in the last 168 hours. Cardiac Enzymes: No results for input(s): CKTOTAL, CKMB, CKMBINDEX, TROPONINI in the last 168 hours. BNP (last 3 results) No results for input(s): BNP in the last 8760 hours.  ProBNP (last 3 results) No results for input(s): PROBNP in the last 8760 hours.  CBG: No results for input(s): GLUCAP in the last 168 hours. No results found for this or any previous visit (from the past 240 hour(s)).   Studies: No results found.    Joycelyn Das, MD  [facility] 07/15/2020

## 2020-07-15 NOTE — Plan of Care (Signed)
°  Problem: Activity: Goal: Risk for activity intolerance will decrease Outcome: Progressing   Problem: Education: Goal: Expressions of having a comfortable level of knowledge regarding the disease process will increase Outcome: Progressing   Problem: Coping: Goal: Ability to adjust to condition or change in health will improve Outcome: Progressing Goal: Ability to identify appropriate support needs will improve Outcome: Progressing   Problem: Health Behavior/Discharge Planning: Goal: Compliance with prescribed medication regimen will improve Outcome: Progressing   Problem: Safety: Goal: Verbalization of understanding the information provided will improve Outcome: Progressing   Problem: Self-Concept: Goal: Ability to verbalize feelings about condition will improve Outcome: Progressing   Problem: Education: Goal: Knowledge of General Education information will improve Description: Including pain rating scale, medication(s)/side effects and non-pharmacologic comfort measures Outcome: Progressing   Problem: Health Behavior/Discharge Planning: Goal: Ability to manage health-related needs will improve Outcome: Progressing   Problem: Clinical Measurements: Goal: Ability to maintain clinical measurements within normal limits will improve Outcome: Progressing Goal: Will remain free from infection Outcome: Progressing Goal: Diagnostic test results will improve Outcome: Progressing Goal: Respiratory complications will improve Outcome: Progressing Goal: Cardiovascular complication will be avoided Outcome: Progressing   Problem: Nutrition: Goal: Adequate nutrition will be maintained Outcome: Progressing   Problem: Coping: Goal: Level of anxiety will decrease Outcome: Progressing   Problem: Elimination: Goal: Will not experience complications related to bowel motility Outcome: Progressing Goal: Will not experience complications related to urinary retention Outcome:  Progressing   Problem: Pain Managment: Goal: General experience of comfort will improve Outcome: Progressing   Problem: Safety: Goal: Ability to remain free from injury will improve Outcome: Progressing   Problem: Skin Integrity: Goal: Risk for impaired skin integrity will decrease Outcome: Progressing   Problem: Education: Goal: Knowledge of General Education information will improve Description: Including pain rating scale, medication(s)/side effects and non-pharmacologic comfort measures Outcome: Progressing   Problem: Education: Goal: Expressions of having a comfortable level of knowledge regarding the disease process will increase Outcome: Progressing   Problem: Coping: Goal: Ability to adjust to condition or change in health will improve Outcome: Progressing Goal: Ability to identify appropriate support needs will improve Outcome: Progressing   Problem: Health Behavior/Discharge Planning: Goal: Compliance with prescribed medication regimen will improve Outcome: Progressing   Problem: Medication: Goal: Risk for medication side effects will decrease Outcome: Progressing   Problem: Clinical Measurements: Goal: Complications related to the disease process, condition or treatment will be avoided or minimized Outcome: Progressing Goal: Diagnostic test results will improve Outcome: Progressing   Problem: Safety: Goal: Verbalization of understanding the information provided will improve Outcome: Progressing   Problem: Self-Concept: Goal: Level of anxiety will decrease Outcome: Progressing Goal: Ability to verbalize feelings about condition will improve Outcome: Progressing

## 2020-07-16 LAB — BASIC METABOLIC PANEL
Anion gap: 10 (ref 5–15)
BUN: 25 mg/dL — ABNORMAL HIGH (ref 6–20)
CO2: 24 mmol/L (ref 22–32)
Calcium: 9.1 mg/dL (ref 8.9–10.3)
Chloride: 105 mmol/L (ref 98–111)
Creatinine, Ser: 1.18 mg/dL (ref 0.61–1.24)
GFR, Estimated: >60 mL/min (ref >60)
Glucose, Bld: 93 mg/dL (ref 70–99)
Potassium: 4 mmol/L (ref 3.5–5.1)
Sodium: 139 mmol/L (ref 135–145)

## 2020-07-16 LAB — CBC
HCT: 39.3 % (ref 39.0–52.0)
Hemoglobin: 13.5 g/dL (ref 13.0–17.0)
MCH: 28.7 pg (ref 26.0–34.0)
MCHC: 34.4 g/dL (ref 30.0–36.0)
MCV: 83.6 fL (ref 80.0–100.0)
Platelets: 265 10*3/uL (ref 150–400)
RBC: 4.7 MIL/uL (ref 4.22–5.81)
RDW: 12.8 % (ref 11.5–15.5)
WBC: 6.6 10*3/uL (ref 4.0–10.5)
nRBC: 0 % (ref 0.0–0.2)

## 2020-07-16 NOTE — Progress Notes (Signed)
PROGRESS NOTE  [name] [id number]:[id number] DOB: 10/07/1970 DOA: 05/07/2020 PCP: Patient, No Pcp Per   LOS: 70 days   Brief narrative: As per HPI and review of records,  Lance Ray is a 49 y.o. malewith medical history significant fordementia, agitation and seizure was admitted hospital as a consultation from psychiatry for recurrent seizures.Patient was initally admitted on 6/30 under hospitalist service for generalized tonic clonic seizuredue to non-compliance with Depakote.He was loaded with IV lorazepam and keppra and admitted to PCU.Had negative MRI brain and CT head. Hospital course was complicated by psychosis and agitation and required precedex drip for agitation. Psychiatry wasconsulted and he wasultimatelydischarged to behavior health on 7/16 and wasplaced under involuntary commitmentperiod. While in the behavioral health unit, his sitter apparently heard the patient scream and witnessed patient have a generalized tonic-clonic seizurethat lastedabout 2 minutes was brought back to inpatient unit. Patient does not have capacity to make decisions for himself. He has been evaluated by thepsychiatrist who said patient has no capacity to make decision for himself.Patient is awaiting legal guardianship prior to discharge.    Assessment/Plan:   Principal Problem:   Seizure (HCC) Active Problems:   Psychosis (HCC)   Hypotension   Vitamin D deficiency   Impaired insight   Alcohol abuse  History of recurrent seizures.  Likely secondary to noncompliance.  Currently on lamotrigine, Keppra and Klonopin.  No further seizures noted.  History of psychosis with agitation.  Continue Haldol, Lamictal Klonopin, olanzapine.  Has been seen by psychiatry during hospitalization.  No active issues at this time.  Vitamin B12 and vitamin D deficiency.    Continue pyridoxine, thiamine, vitamin B12 and vitamin D.  DVT prophylaxis: enoxaparin (LOVENOX) injection 40 mg Start:  05/08/20 0000   Code Status: Full code  Family Communication: None  Status is: Inpatient  Remains inpatient appropriate because:Unsafe d/c plan and Awaiting for legal guardianship and placement   Dispo:  Patient From: Home  Planned Disposition: Undetermined at this time.  Likely to group home  Expected discharge date: Unknown at this time  Medically stable for discharge: Yes  Consultants:  Psychiatry  Neurology   Procedures:  None  Antibiotics:  . None  Anti-infectives (From admission, onward)   None      Subjective: Today, patient denies interval complains.  No fever chills nausea vomiting.  Objective: Vitals:   07/16/20 0006 07/16/20 0356  BP: 119/83 111/80  Pulse: 86 70  Resp: 18 18  Temp: 98.3 F (36.8 C) (!) 97.5 F (36.4 C)  SpO2: 97% 97%    Intake/Output Summary (Last 24 hours) at 07/16/2020 0729 Last data filed at 07/15/2020 0900 Gross per 24 hour  Intake 240 ml  Output --  Net 240 ml   Filed Weights   05/29/20 0500 05/30/20 0450 05/31/20 0452  Weight: 66.8 kg 64.3 kg 65.5 kg   Body mass index is 21.31 kg/m.   Physical Exam:  General: Thinly built, not in obvious distress HENT:   No scleral pallor or icterus noted. Oral mucosa is moist.  Chest:  Clear breath sounds.  Diminished breath sounds bilaterally. No crackles or wheezes.  CVS: S1 &S2 heard. No murmur.  Regular rate and rhythm. Abdomen: Soft, nontender, nondistended.  Bowel sounds are heard.   Extremities: No cyanosis, clubbing or edema.  Peripheral pulses are palpable. Psych: Alert, awake and oriented,  CNS:  No cranial nerve deficits.  Power equal in all extremities.   Skin: Warm and dry.  No rashes noted.  Data Review: I have personally reviewed the following laboratory data and studies,  CBC: Recent Labs  Lab 07/10/20 0429 07/16/20 0536  WBC 6.5 6.6  HGB 13.4 13.5  HCT 40.0 39.3  MCV 86.2 83.6  PLT 249 265   Basic Metabolic Panel: Recent Labs  Lab  07/16/20 0536  NA 139  K 4.0  CL 105  CO2 24  GLUCOSE 93  BUN 25*  CREATININE 1.18  CALCIUM 9.1   Liver Function Tests: No results for input(s): AST, ALT, ALKPHOS, BILITOT, PROT, ALBUMIN in the last 168 hours. No results for input(s): LIPASE, AMYLASE in the last 168 hours. No results for input(s): AMMONIA in the last 168 hours. Cardiac Enzymes: No results for input(s): CKTOTAL, CKMB, CKMBINDEX, TROPONINI in the last 168 hours. BNP (last 3 results) No results for input(s): BNP in the last 8760 hours.  ProBNP (last 3 results) No results for input(s): PROBNP in the last 8760 hours.  CBG: No results for input(s): GLUCAP in the last 168 hours. No results found for this or any previous visit (from the past 240 hour(s)).   Studies: No results found.    Joycelyn Das, MD  [facility] 07/16/2020

## 2020-07-16 NOTE — Plan of Care (Signed)
  Problem: Education: Goal: Knowledge of General Education information will improve Description Including pain rating scale, medication(s)/side effects and non-pharmacologic comfort measures Outcome: Progressing   

## 2020-07-17 NOTE — Progress Notes (Signed)
PROGRESS NOTE  CAPRI RABEN [id number]:[id number] DOB: 1971/01/21 DOA: 05/07/2020 PCP: Patient, No Pcp Per   LOS: 71 days   Brief narrative: As per HPI and review of records,  Lance Ray is a 49 y.o. malewith medical history significant fordementia, agitation and seizure was admitted hospital as a consultation from psychiatry for recurrent seizures.Patient was initally admitted on 6/30 under hospitalist service for generalized tonic clonic seizuredue to non-compliance with Depakote.He was loaded with IV lorazepam and keppra and admitted to PCU.Had negative MRI brain and CT head. Hospital course was complicated by psychosis and agitation and required precedex drip for agitation. Psychiatry wasconsulted and he wasultimatelydischarged to behavior health on 7/16 and wasplaced under involuntary commitmentperiod. While in the behavioral health unit, his sitter apparently heard the patient scream and witnessed patient have a generalized tonic-clonic seizurethat lastedabout 2 minutes was brought back to inpatient unit. Patient does not have capacity to make decisions for himself. He has been evaluated by thepsychiatrist who said patient has no capacity to make decision for himself.Patient is awaiting legal guardianship prior to discharge.    Assessment/Plan:   Principal Problem:   Seizure (HCC) Active Problems:   Psychosis (HCC)   Hypotension   Vitamin D deficiency   Impaired insight   Alcohol abuse  History of recurrent seizures.  Likely secondary to noncompliance.  Currently on lamotrigine, Keppra and Klonopin.  Has remained stable.  History of psychosis with agitation.  Continue Haldol, Lamictal Klonopin, olanzapine.  Has been seen by psychiatry during hospitalization.  No active issues at this time.  Vitamin B12 and vitamin D deficiency.    Continue pyridoxine, thiamine, vitamin B12 and vitamin D.  DVT prophylaxis: enoxaparin (LOVENOX) injection 40 mg Start: 05/08/20  0000   Code Status: Full code  Family Communication: None  Status is: Inpatient  Remains inpatient appropriate because:Unsafe d/c plan and Awaiting for legal guardianship and placement   Dispo:  Patient From: Home  Planned Disposition: Undetermined at this time.  Likely to group home  Expected discharge date: Unknown at this time  Medically stable for discharge: Yes  Consultants:  Psychiatry  Neurology   Procedures:  None  Antibiotics:  . None  Anti-infectives (From admission, onward)   None      Subjective: Today, patient was seen and examined at bedside.  Denies any nausea vomiting shortness of breath cough fever or chills.  Objective: Vitals:   07/17/20 0057 07/17/20 0415  BP: 107/70 112/74  Pulse: 75 70  Resp: 16 16  Temp: 98.3 F (36.8 C) 97.8 F (36.6 C)  SpO2: 96% 96%    Intake/Output Summary (Last 24 hours) at 07/17/2020 0731 Last data filed at 07/17/2020 0057 Gross per 24 hour  Intake 600 ml  Output 0 ml  Net 600 ml   Filed Weights   05/29/20 0500 05/30/20 0450 05/31/20 0452  Weight: 66.8 kg 64.3 kg 65.5 kg   Body mass index is 21.31 kg/m.   Physical Exam:  General: Thinly built, not in obvious distress HENT:   No scleral pallor or icterus noted. Oral mucosa is moist.  Chest:  Clear breath sounds.  Diminished breath sounds bilaterally. No crackles or wheezes.  CVS: S1 &S2 heard. No murmur.  Regular rate and rhythm. Abdomen: Soft, nontender, nondistended.  Bowel sounds are heard.   Extremities: No cyanosis, clubbing or edema.  Peripheral pulses are palpable. Psych: Alert, awake and oriented,  CNS:  No cranial nerve deficits.  Power equal in all extremities.   Skin:  Warm and dry.  No rashes noted.  Data Review: I have personally reviewed the following laboratory data and studies,  CBC: Recent Labs  Lab 07/16/20 0536  WBC 6.6  HGB 13.5  HCT 39.3  MCV 83.6  PLT 265   Basic Metabolic Panel: Recent Labs  Lab  07/16/20 0536  NA 139  K 4.0  CL 105  CO2 24  GLUCOSE 93  BUN 25*  CREATININE 1.18  CALCIUM 9.1   Liver Function Tests: No results for input(s): AST, ALT, ALKPHOS, BILITOT, PROT, ALBUMIN in the last 168 hours. No results for input(s): LIPASE, AMYLASE in the last 168 hours. No results for input(s): AMMONIA in the last 168 hours. Cardiac Enzymes: No results for input(s): CKTOTAL, CKMB, CKMBINDEX, TROPONINI in the last 168 hours. BNP (last 3 results) No results for input(s): BNP in the last 8760 hours.  ProBNP (last 3 results) No results for input(s): PROBNP in the last 8760 hours.  CBG: No results for input(s): GLUCAP in the last 168 hours. No results found for this or any previous visit (from the past 240 hour(s)).   Studies: No results found.    Joycelyn Das, MD  [facility] 07/17/2020

## 2020-07-18 NOTE — Progress Notes (Signed)
PROGRESS NOTE  Lance Ray QPY:[id number] DOB: [birth year] DOA: 05/07/2020 PCP: Patient, No Pcp Per   LOS: 72 days   Brief narrative: As per HPI and review of records,  Lance Ray is a 49 y.o. malewith medical history significant fordementia, agitation and seizure was admitted hospital as a consultation from psychiatry for recurrent seizures.Patient was initally admitted on 6/30 under hospitalist service for generalized tonic clonic seizuredue to non-compliance with Depakote.He was loaded with IV lorazepam and keppra and admitted to PCU.Had negative MRI brain and CT head. Hospital course was complicated by psychosis and agitation and required precedex drip for agitation. Psychiatry wasconsulted and he wasultimatelydischarged to behavior health on 7/16 and wasplaced under involuntary commitmentperiod. While in the behavioral health unit, his sitter apparently heard the patient scream and witnessed patient have a generalized tonic-clonic seizurethat lastedabout 2 minutes was brought back to inpatient unit. Patient does not have capacity to make decisions for himself. He has been evaluated by thepsychiatrist who said patient has no capacity to make decision for himself.Patient is awaiting legal guardianship prior to discharge.    Assessment/Plan:   Principal Problem:   Seizure (HCC) Active Problems:   Psychosis (HCC)   Hypotension   Vitamin D deficiency   Impaired insight   Alcohol abuse  History of recurrent seizures.  Likely secondary to noncompliance.  Currently on lamotrigine, Keppra and Klonopin.  Clinically stable.  History of psychosis with agitation.  Continue Haldol, Lamictal Klonopin, olanzapine.  Has been seen by psychiatry during hospitalization.  No active issues at this time.  Vitamin B12 and vitamin D deficiency.    Continue pyridoxine, thiamine, vitamin B12 and vitamin D.  DVT prophylaxis: enoxaparin (LOVENOX) injection 40 mg Start: 05/08/20  0000   Code Status: Full code  Family Communication: None  Status is: Inpatient  Remains inpatient appropriate because:Unsafe d/c plan and Awaiting for legal guardianship and placement   Dispo:  Patient From: Home  Planned Disposition: Undetermined at this time.  Likely to group home  Expected discharge date: Unknown at this time  Medically stable for discharge: Yes  Consultants:  Psychiatry  Neurology   Procedures:  None  Antibiotics:  . None  Anti-infectives (From admission, onward)   None      Subjective: Today, patient was seen and examined at bedside.  Patient denies any interval complaints.  Denies any shortness of breath fever chills or rigor.    Objective: Vitals:   07/18/20 0011 07/18/20 0402  BP: 105/78 106/75  Pulse: 82 74  Resp: 18 16  Temp: 98.4 F (36.9 C) 98.4 F (36.9 C)  SpO2: 95% 97%   No intake or output data in the 24 hours ending 07/18/20 0809 Filed Weights   05/29/20 0500 05/30/20 0450 05/31/20 0452  Weight: 66.8 kg 64.3 kg 65.5 kg   Body mass index is 21.31 kg/m.   Physical Exam:  General: Thinly built, not in obvious distress HENT:   No scleral pallor or icterus noted. Oral mucosa is moist.  Chest:  Clear breath sounds.  Diminished breath sounds bilaterally. No crackles or wheezes.  CVS: S1 &S2 heard. No murmur.  Regular rate and rhythm. Abdomen: Soft, nontender, nondistended.  Bowel sounds are heard.   Extremities: No cyanosis, clubbing or edema.  Peripheral pulses are palpable. Psych: Alert, awake and oriented, normal mood CNS:  No cranial nerve deficits.  Power equal in all extremities.   Skin: Warm and dry.  No rashes noted.   Data Review: I have personally reviewed  the following laboratory data and studies,  CBC: Recent Labs  Lab 07/16/20 0536  WBC 6.6  HGB 13.5  HCT 39.3  MCV 83.6  PLT 265   Basic Metabolic Panel: Recent Labs  Lab 07/16/20 0536  NA 139  K 4.0  CL 105  CO2 24  GLUCOSE 93  BUN  25*  CREATININE 1.18  CALCIUM 9.1   Liver Function Tests: No results for input(s): AST, ALT, ALKPHOS, BILITOT, PROT, ALBUMIN in the last 168 hours. No results for input(s): LIPASE, AMYLASE in the last 168 hours. No results for input(s): AMMONIA in the last 168 hours. Cardiac Enzymes: No results for input(s): CKTOTAL, CKMB, CKMBINDEX, TROPONINI in the last 168 hours. BNP (last 3 results) No results for input(s): BNP in the last 8760 hours.  ProBNP (last 3 results) No results for input(s): PROBNP in the last 8760 hours.  CBG: No results for input(s): GLUCAP in the last 168 hours. No results found for this or any previous visit (from the past 240 hour(s)).   Studies: No results found.    Joycelyn Das, MD  [facility] 07/18/2020

## 2020-07-19 NOTE — TOC Progression Note (Signed)
Transition of Care [facility]) - Progression Note    Patient Details  Name: Lance Ray MRN: [id number] Date of Birth: 25-Feb-1971  [facility] CM/SW Contact  Trenton Founds, RN Phone Number: 07/19/2020, 10:54 AM  Clinical Narrative:   RNCM reached out to patient's sister [name] and left a voicemail for return call.          Expected Discharge Plan and Services                                                 Social Determinants of Health (SDOH) Interventions    Readmission Risk Interventions Readmission Risk Prevention Plan 03/29/2020  Transportation Screening Complete  HRI or Home Care Consult Complete  Social Work Consult for Recovery Care Planning/Counseling Complete  Palliative Care Screening Not Applicable  Medication Review [facility]) Referral to Pharmacy  Some recent data might be hidden

## 2020-07-19 NOTE — Progress Notes (Signed)
PROGRESS NOTE  Lance Ray QMV:[id number] DOB: 1970/10/14 DOA: 05/07/2020 PCP: Patient, No Pcp Per   LOS: 73 days   Brief narrative: As per HPI and review of records,  Lance Ray is a 49 y.o. malewith medical history significant fordementia, agitation and seizure was admitted hospital as a consultation from psychiatry for recurrent seizures.Patient was initally admitted on 6/30 under hospitalist service for generalized tonic clonic seizuredue to non-compliance with Depakote.He was loaded with IV lorazepam and keppra and admitted to PCU.Had negative MRI brain and CT head. Hospital course was complicated by psychosis and agitation and required precedex drip for agitation. Psychiatry wasconsulted and he wasultimatelydischarged to behavior health on 7/16 and wasplaced under involuntary commitmentperiod. While in the behavioral health unit, his sitter apparently heard the patient scream and witnessed patient have a generalized tonic-clonic seizurethat lastedabout 2 minutes was brought back to inpatient unit. Patient does not have capacity to make decisions for himself. He has been evaluated by thepsychiatrist who said patient has no capacity to make decision for himself.Patient is awaiting legal guardianship prior to discharge.    Assessment/Plan:   Principal Problem:   Seizure (HCC) Active Problems:   Psychosis (HCC)   Hypotension   Vitamin D deficiency   Impaired insight   Alcohol abuse  History of recurrent seizures.  Likely secondary to noncompliance.  Currently on lamotrigine, Keppra and Klonopin.  No further seizures in the hospital  History of psychosis with agitation.  Continue Haldol, Lamictal Klonopin, olanzapine.  Has been seen by psychiatry during hospitalization.  No active issues at this time.  Vitamin B12 and vitamin D deficiency.    Continue pyridoxine, thiamine, vitamin B12 and vitamin D.  DVT prophylaxis: enoxaparin (LOVENOX) injection 40 mg  Start: 05/08/20 0000   Code Status: Full code  Family Communication: None  Status is: Inpatient  Remains inpatient appropriate because: Awaiting for legal guardianship and placement  Dispo:  Patient From: Home  Planned Disposition: Undetermined at this time.  Likely to group home  Expected discharge date: Unknown at this time  Medically stable for discharge: Yes  Consultants:  Psychiatry  Neurology   Procedures:  None  Antibiotics:  . None  Anti-infectives (From admission, onward)   None      Subjective: Today, patient denies interval complaints.  No nausea vomiting fever chills or rigor.   Objective: Vitals:   07/18/20 2331 07/19/20 0738  BP: 95/60 103/75  Pulse: 80 64  Resp: 18 16  Temp: 98.5 F (36.9 C) (!) 97.5 F (36.4 C)  SpO2: 97% 97%   No intake or output data in the 24 hours ending 07/19/20 0834 Filed Weights   05/29/20 0500 05/30/20 0450 05/31/20 0452  Weight: 66.8 kg 64.3 kg 65.5 kg   Body mass index is 21.31 kg/m.   Physical Exam: General: Thinly built, not in obvious distress HENT:   No scleral pallor or icterus noted. Oral mucosa is moist.  Chest:  Clear breath sounds.  Diminished breath sounds bilaterally. No crackles or wheezes.  CVS: S1 &S2 heard. No murmur.  Regular rate and rhythm. Abdomen: Soft, nontender, nondistended.  Bowel sounds are heard.   Extremities: No cyanosis, clubbing or edema.  Peripheral pulses are palpable. Psych: Alert, awake and oriented, normal mood CNS:  No cranial nerve deficits.  Power equal in all extremities.   Skin: Warm and dry.  No rashes noted.   Data Review: I have personally reviewed the following laboratory data and studies,  CBC: Recent Labs  Lab  07/16/20 0536  WBC 6.6  HGB 13.5  HCT 39.3  MCV 83.6  PLT 265   Basic Metabolic Panel: Recent Labs  Lab 07/16/20 0536  NA 139  K 4.0  CL 105  CO2 24  GLUCOSE 93  BUN 25*  CREATININE 1.18  CALCIUM 9.1   Liver Function  Tests: No results for input(s): AST, ALT, ALKPHOS, BILITOT, PROT, ALBUMIN in the last 168 hours. No results for input(s): LIPASE, AMYLASE in the last 168 hours. No results for input(s): AMMONIA in the last 168 hours. Cardiac Enzymes: No results for input(s): CKTOTAL, CKMB, CKMBINDEX, TROPONINI in the last 168 hours. BNP (last 3 results) No results for input(s): BNP in the last 8760 hours.  ProBNP (last 3 results) No results for input(s): PROBNP in the last 8760 hours.  CBG: No results for input(s): GLUCAP in the last 168 hours. No results found for this or any previous visit (from the past 240 hour(s)).   Studies: No results found.    Joycelyn Das, MD  [facility] 07/19/2020

## 2020-07-20 NOTE — Progress Notes (Signed)
Lance Ray, Lance Ray, Lance Ray   LOS: 74 days   Brief narrative: As Ray HPI and review of records,  Lance AMIRAULT is a 49 y.o. malewith medical history significant fordementia, agitation and seizure was admitted hospital as a consultation from psychiatry for recurrent seizures.Ray was initally admitted on 6/30 under hospitalist service for generalized tonic clonic seizuredue to non-compliance with Depakote.He was loaded with IV lorazepam and keppra and admitted to PCU.Had negative MRI brain and CT head. Hospital course was complicated by psychosis and agitation and required precedex drip for agitation. Psychiatry wasconsulted and he wasultimatelydischarged to behavior health on 7/16 and wasplaced under involuntary commitmentperiod. While in the behavioral health unit, his sitter apparently heard the Ray scream and witnessed Ray have a generalized tonic-clonic seizurethat lastedabout 2 minutes was brought back to inpatient unit. Ray does not have capacity to make decisions for himself. He has been evaluated by thepsychiatrist who said Ray has Lance capacity to make decision for himself.Ray is awaiting legal guardianship prior to discharge.    Assessment/Plan:   Principal Problem:   Seizure (HCC) Active Problems:   Psychosis (HCC)   Hypotension   Vitamin D deficiency   Impaired insight   Alcohol abuse  History of recurrent seizures.  Likely secondary to her noncompliance.  Ray is currently on lamotrigine, Keppra and Klonopin.  Lance further seizures have been noted in the hospital.  We will continue the same for now.    History of psychosis with agitation.  Ray appears to be more composed at this time.  Was seen by psychiatry during hospitalization.  Continue Haldol, Lamictal Klonopin, olanzapine.   Vitamin B12 and vitamin D deficiency.    Continue pyridoxine, thiamine,  vitamin B12 and vitamin D.  DVT prophylaxis: enoxaparin (LOVENOX) injection 40 mg Start: 05/08/20 0000   Code Status: Full code  Family Communication: None  Status is: Inpatient  Remains inpatient appropriate because: Awaiting for legal guardianship and placement  Dispo:  Ray From: Home  Planned Disposition: Undetermined at this time.  Likely to group home  Expected discharge date: Unknown at this time  Medically stable for discharge: Yes  Consultants:  Psychiatry  Neurology   Procedures:  None  Antibiotics:  . None  Anti-infectives (From admission, onward)   None      Subjective: Today, denies interval complains. Lance fever, chills, nausea, vomiting, chest pain. Doing well  Objective: Vitals:   07/20/20 0554 07/20/20 0736  BP: 100/74 107/76  Pulse: 63 76  Resp: 18 16  Temp: 97.6 F (36.4 C) 97.8 F (36.6 C)  SpO2: 96% 97%    Intake/Output Summary (Last 24 hours) at 07/20/2020 0824 Last data filed at 07/19/2020 1847 Gross Ray 24 hour  Intake 720 ml  Output --  Net 720 ml   Filed Weights   05/29/20 0500 05/30/20 0450 05/31/20 0452  Weight: 66.8 kg 64.3 kg 65.5 kg   Body mass index is 21.31 kg/m.   Physical Exam:  General: Thinly built, not in obvious distress, communicative, not in distress. HENT:   Lance scleral pallor or icterus noted. Oral mucosa is moist.  Chest:  Clear breath sounds.  Diminished breath sounds bilaterally. Lance crackles or wheezes.  CVS: S1 &S2 heard. Lance murmur.  Regular rate and rhythm. Abdomen: Soft, nontender, nondistended.  Bowel sounds are heard.   Extremities: Lance cyanosis, clubbing or edema.  Peripheral pulses are palpable. Psych: Alert, awake and communicative, normal mood  CNS:  Lance cranial nerve deficits.  Power equal in all extremities.   Skin: Warm and dry.  Lance rashes noted.   Data Review: I have personally reviewed the following laboratory data and studies,  CBC: Recent Labs  Lab 07/16/20 0536  WBC 6.6   HGB 13.5  HCT 39.3  MCV 83.6  PLT 265   Basic Metabolic Panel: Recent Labs  Lab 07/16/20 0536  NA 139  K 4.0  CL 105  CO2 24  GLUCOSE 93  BUN 25*  CREATININE 1.18  CALCIUM 9.1   Liver Function Tests: Lance results for input(s): AST, ALT, ALKPHOS, BILITOT, PROT, ALBUMIN in the last 168 hours. Lance results for input(s): LIPASE, AMYLASE in the last 168 hours. Lance results for input(s): AMMONIA in the last 168 hours. Cardiac Enzymes: Lance results for input(s): CKTOTAL, CKMB, CKMBINDEX, TROPONINI in the last 168 hours. BNP (last 3 results) Lance results for input(s): BNP in the last 8760 hours.  ProBNP (last 3 results) Lance results for input(s): PROBNP in the last 8760 hours.  CBG: Lance results for input(s): GLUCAP in the last 168 hours. Lance results found for this or any previous visit (from the past 240 hour(s)).   Studies: Lance results found.    Joycelyn Das, MD  [facility] 07/20/2020

## 2020-07-20 NOTE — TOC Progression Note (Signed)
Transition of Care [facility]) - Progression Note    Patient Details  Name: KOBYN KRAY MRN: [id number] Date of Birth: 20-Sep-1971  [facility] CM/SW Contact  Trenton Founds, RN Phone Number: 07/20/2020, 9:01 AM  Clinical Narrative:   RNCM reached out to patient's sister [name] who returned call. [name] reports that she is still waiting to hear from the [place] of Court that they have completed the paperwork for her guardianship. She further reports that she can't continue with patient's Medicaid application until that happens because patient has some money in the bank and that needs to be spent down paying his bills before he will qualify for Medicaid. She reports that until she can get the paperwork from the [employer] she can't gain access to his funds. [name] continues to verbalize frustration of former guardian, attorney here in town pushing out the court proceedings as far as he did. She also verbalizes concerns over his being placed in a group home/ALF type situation as she is afraid he will just leave.          Expected Discharge Plan and Services                                                 Social Determinants of Health (SDOH) Interventions    Readmission Risk Interventions Readmission Risk Prevention Plan 03/29/2020  Transportation Screening Complete  HRI or Home Care Consult Complete  Social Work Consult for Recovery Care Planning/Counseling Complete  Palliative Care Screening Not Applicable  Medication Review [facility]) Referral to Pharmacy  Some recent data might be hidden

## 2020-07-21 NOTE — Progress Notes (Signed)
PROGRESS NOTE  Lance Ray [id number]:[id number] DOB: 01-24-1971 DOA: 05/07/2020 PCP: Patient, No Pcp Per   LOS: 75 days   Brief narrative: As per HPI and review of records,  Lance Ray is a 49 y.o. malewith medical history significant fordementia, agitation and seizure was admitted hospital as a consultation from psychiatry for recurrent seizures.Patient was initally admitted on 6/30 under hospitalist service for generalized tonic clonic seizuredue to non-compliance with Depakote.He was loaded with IV lorazepam and keppra and admitted to PCU.Had negative MRI brain and CT head. Hospital course was complicated by psychosis and agitation and required precedex drip for agitation. Psychiatry wasconsulted and he wasultimatelydischarged to behavior health on 7/16 and wasplaced under involuntary commitmentperiod. While in the behavioral health unit, his sitter apparently heard the patient scream and witnessed patient have a generalized tonic-clonic seizurethat lastedabout 2 minutes was brought back to inpatient unit. Patient does not have capacity to make decisions for himself. He has been evaluated by thepsychiatrist who said patient has no capacity to make decision for himself.Patient is awaiting legal guardianship prior to discharge.    Assessment/Plan:   Principal Problem:   Seizure (HCC) Active Problems:   Psychosis (HCC)   Hypotension   Vitamin D deficiency   Impaired insight   Alcohol abuse  History of recurrent seizures.  Likely secondary to her noncompliance.  Patient is currently on lamotrigine, Keppra and Klonopin.  No further seizures have been noted in the hospital. continue the same for now.    History of psychosis with agitation.  Patient appears to be more composed at this time.  Was seen by psychiatry during hospitalization.  Continue Haldol, Lamictal Klonopin, olanzapine.   Vitamin B12 and vitamin D deficiency.    Continue pyridoxine, thiamine, vitamin B12  and vitamin D.  DVT prophylaxis: enoxaparin (LOVENOX) injection 40 mg Start: 05/08/20 0000   Code Status: Full code  Family Communication: None  Status is: Inpatient  Remains inpatient appropriate because: Awaiting for legal guardianship and placement  Dispo:  Patient From: Home  Planned Disposition: Undetermined at this time.  Likely to group home  Expected discharge date: Unknown at this time  Medically stable for discharge: Yes  Consultants:  Psychiatry  Neurology   Procedures:  None  Antibiotics:  . None  Anti-infectives (From admission, onward)   None      Subjective: No new issues.  He seems comfortable.  Objective: Vitals:   07/21/20 1241 07/21/20 1553  BP: 109/79 111/82  Pulse: 74 63  Resp: 18 18  Temp: 98.4 F (36.9 C) 98.5 F (36.9 C)  SpO2: 97% 100%   No intake or output data in the 24 hours ending 07/21/20 1710 Filed Weights   05/29/20 0500 05/30/20 0450 05/31/20 0452  Weight: 66.8 kg 64.3 kg 65.5 kg   Body mass index is 21.31 kg/m.   Physical Exam:  General: Thinly built, not in obvious distress, communicative, not in distress. HENT:   No scleral pallor or icterus noted. Oral mucosa is moist.  Chest:  Clear breath sounds.  Diminished breath sounds bilaterally. No crackles or wheezes.  CVS: S1 &S2 heard. No murmur.  Regular rate and rhythm. Abdomen: Soft, nontender, nondistended.  Bowel sounds are heard.   Extremities: No cyanosis, clubbing or edema.  Peripheral pulses are palpable. Psych: Alert, awake and communicative, normal mood CNS:  No cranial nerve deficits.  Power equal in all extremities.   Skin: Warm and dry.  No rashes noted.   Data Review: I have personally  reviewed the following laboratory data and studies,  CBC: Recent Labs  Lab 07/16/20 0536  WBC 6.6  HGB 13.5  HCT 39.3  MCV 83.6  PLT 265   Basic Metabolic Panel: Recent Labs  Lab 07/16/20 0536  NA 139  K 4.0  CL 105  CO2 24  GLUCOSE 93  BUN 25*    CREATININE 1.18  CALCIUM 9.1   Liver Function Tests: No results for input(s): AST, ALT, ALKPHOS, BILITOT, PROT, ALBUMIN in the last 168 hours. No results for input(s): LIPASE, AMYLASE in the last 168 hours. No results for input(s): AMMONIA in the last 168 hours. Cardiac Enzymes: No results for input(s): CKTOTAL, CKMB, CKMBINDEX, TROPONINI in the last 168 hours. BNP (last 3 results) No results for input(s): BNP in the last 8760 hours.  ProBNP (last 3 results) No results for input(s): PROBNP in the last 8760 hours.  CBG: No results for input(s): GLUCAP in the last 168 hours. No results found for this or any previous visit (from the past 240 hour(s)).   Studies: No results found.    Delfino Lovett, MD  [facility] 07/21/2020

## 2020-07-22 NOTE — Progress Notes (Signed)
PROGRESS NOTE  Lance Ray QVZ:[id number] DOB: [birth year] DOA: 05/07/2020 PCP: Patient, No Pcp Per   LOS: 76 days   Brief narrative: As per HPI and review of records,  Lance Ray is a 49 y.o. malewith medical history significant fordementia, agitation and seizure was admitted hospital as a consultation from psychiatry for recurrent seizures.Patient was initally admitted on 6/30 under hospitalist service for generalized tonic clonic seizuredue to non-compliance with Depakote.He was loaded with IV lorazepam and keppra and admitted to PCU.Had negative MRI brain and CT head. Hospital course was complicated by psychosis and agitation and required precedex drip for agitation. Psychiatry wasconsulted and he wasultimatelydischarged to behavior health on 7/16 and wasplaced under involuntary commitmentperiod. While in the behavioral health unit, his sitter apparently heard the patient scream and witnessed patient have a generalized tonic-clonic seizurethat lastedabout 2 minutes was brought back to inpatient unit. Patient does not have capacity to make decisions for himself. He has been evaluated by thepsychiatrist who said patient has no capacity to make decision for himself.Patient is awaiting legal guardianship prior to discharge.    Assessment/Plan:   Principal Problem:   Seizure (HCC) Active Problems:   Psychosis (HCC)   Hypotension   Vitamin D deficiency   Impaired insight   Alcohol abuse  History of recurrent seizures.  Likely secondary to her noncompliance.  Patient is currently on lamotrigine, Keppra and Klonopin.  No further seizures have been noted in the hospital. continue the same for now.    History of psychosis with agitation.  Patient appears to be more composed at this time.  Was seen by psychiatry during hospitalization.  Continue Haldol, Lamictal Klonopin, olanzapine.   Vitamin B12 and vitamin D deficiency.    Continue pyridoxine, thiamine, vitamin B12  and vitamin D.  DVT prophylaxis: enoxaparin (LOVENOX) injection 40 mg Start: 05/08/20 0000   Code Status: Full code  Family Communication: None  Status is: Inpatient  Remains inpatient appropriate because: Awaiting for legal guardianship and placement  Dispo:  Patient From: Home  Planned Disposition: Undetermined at this time.  Likely to group home  Expected discharge date: Unknown at this time  Medically stable for discharge: Yes  Consultants:  Psychiatry  Neurology   Procedures:  None  Antibiotics:  . None  Anti-infectives (From admission, onward)   None      Subjective: Remains same.  Waiting for placement  Objective: Vitals:   07/22/20 0724 07/22/20 1220  BP: 101/71 92/67  Pulse: 78 71  Resp: 18 18  Temp: 98 F (36.7 C) 98 F (36.7 C)  SpO2: 98% 97%    Intake/Output Summary (Last 24 hours) at 07/22/2020 1256 Last data filed at 07/22/2020 1000 Gross per 24 hour  Intake 480 ml  Output --  Net 480 ml   Filed Weights   05/29/20 0500 05/30/20 0450 05/31/20 0452  Weight: 66.8 kg 64.3 kg 65.5 kg   Body mass index is 21.31 kg/m.   Physical Exam:  General: Thinly built, not in obvious distress, communicative, not in distress. HENT:   No scleral pallor or icterus noted. Oral mucosa is moist.  Chest:  Clear breath sounds.  Diminished breath sounds bilaterally. No crackles or wheezes.  CVS: S1 &S2 heard. No murmur.  Regular rate and rhythm. Abdomen: Soft, nontender, nondistended.  Bowel sounds are heard.   Extremities: No cyanosis, clubbing or edema.  Peripheral pulses are palpable. Psych: Alert, awake and communicative, normal mood CNS:  No cranial nerve deficits.  Power equal in  all extremities.   Skin: Warm and dry.  No rashes noted.   Data Review: I have personally reviewed the following laboratory data and studies,  CBC: Recent Labs  Lab 07/16/20 0536  WBC 6.6  HGB 13.5  HCT 39.3  MCV 83.6  PLT 265   Basic Metabolic  Panel: Recent Labs  Lab 07/16/20 0536  NA 139  K 4.0  CL 105  CO2 24  GLUCOSE 93  BUN 25*  CREATININE 1.18  CALCIUM 9.1   Liver Function Tests: No results for input(s): AST, ALT, ALKPHOS, BILITOT, PROT, ALBUMIN in the last 168 hours. No results for input(s): LIPASE, AMYLASE in the last 168 hours. No results for input(s): AMMONIA in the last 168 hours. Cardiac Enzymes: No results for input(s): CKTOTAL, CKMB, CKMBINDEX, TROPONINI in the last 168 hours. BNP (last 3 results) No results for input(s): BNP in the last 8760 hours.  ProBNP (last 3 results) No results for input(s): PROBNP in the last 8760 hours.  CBG: No results for input(s): GLUCAP in the last 168 hours. No results found for this or any previous visit (from the past 240 hour(s)).   Studies: No results found.    Delfino Lovett, MD  [facility] 07/22/2020

## 2020-07-23 NOTE — Progress Notes (Signed)
PROGRESS NOTE  JAREB RADONCIC [id number] DOB: Dec 23, 1970 DOA: 05/07/2020 PCP: Patient, No Pcp Per   LOS: 77 days   Brief narrative: As per HPI and review of records,  Lance Ray is a 49 y.o. malewith medical history significant fordementia, agitation and seizure was admitted hospital as a consultation from psychiatry for recurrent seizures.Patient was initally admitted on 6/30 under hospitalist service for generalized tonic clonic seizuredue to non-compliance with Depakote.He was loaded with IV lorazepam and keppra and admitted to PCU.Had negative MRI brain and CT head. Hospital course was complicated by psychosis and agitation and required precedex drip for agitation. Psychiatry wasconsulted and he wasultimatelydischarged to behavior health on 7/16 and wasplaced under involuntary commitmentperiod. While in the behavioral health unit, his sitter apparently heard the patient scream and witnessed patient have a generalized tonic-clonic seizurethat lastedabout 2 minutes was brought back to inpatient unit. Patient does not have capacity to make decisions for himself. He has been evaluated by thepsychiatrist who said patient has no capacity to make decision for himself.Patient is awaiting legal guardianship prior to discharge.    Assessment/Plan:   Principal Problem:   Seizure (HCC) Active Problems:   Psychosis (HCC)   Hypotension   Vitamin D deficiency   Impaired insight   Alcohol abuse  History of recurrent seizures.  Likely secondary to her noncompliance.  Continue lamotrigine, Keppra and Klonopin.  No further seizures have been noted in the hospital.   History of psychosis with agitation.  Patient appears to be more composed at this time.  Was seen by psychiatry during hospitalization.  Continue Haldol, Lamictal Klonopin, olanzapine.   Vitamin B12 and vitamin D deficiency.    Continue pyridoxine, thiamine, vitamin B12 and vitamin D.  DVT prophylaxis:  enoxaparin (LOVENOX) injection 40 mg Start: 05/08/20 0000   Code Status: Full code  Family Communication: None  Status is: Inpatient  Remains inpatient appropriate because: Awaiting for legal guardianship and placement  Dispo:  Patient From: Home  Planned Disposition: Undetermined at this time.  Likely to group home  Expected discharge date: Unknown at this time  Medically stable for discharge: Yes  Consultants:  Psychiatry  Neurology   Procedures:  None  Antibiotics:  . None  Anti-infectives (From admission, onward)   None      Subjective: Remains same.  Waiting for update on placement  Objective: Vitals:   07/23/20 0352 07/23/20 0836  BP: 114/80 109/74  Pulse: 80 69  Resp: 18 15  Temp: 98.4 F (36.9 C) (!) 97.5 F (36.4 C)  SpO2: 95% 96%    Intake/Output Summary (Last 24 hours) at 07/23/2020 1109 Last data filed at 07/22/2020 1300 Gross per 24 hour  Intake 240 ml  Output --  Net 240 ml   Filed Weights   05/29/20 0500 05/30/20 0450 05/31/20 0452  Weight: 66.8 kg 64.3 kg 65.5 kg   Body mass index is 21.31 kg/m.   Physical Exam:  General: Thinly built, not in obvious distress, communicative, not in distress. HENT:   No scleral pallor or icterus noted. Oral mucosa is moist.  Chest:  Clear breath sounds.  Diminished breath sounds bilaterally. No crackles or wheezes.  CVS: S1 &S2 heard. No murmur.  Regular rate and rhythm. Abdomen: Soft, nontender, nondistended.  Bowel sounds are heard.   Extremities: No cyanosis, clubbing or edema.  Peripheral pulses are palpable. Psych: Alert, awake and communicative, normal mood CNS:  No cranial nerve deficits.  Power equal in all extremities.   Skin: Warm  and dry.  No rashes noted.   Data Review: I have personally reviewed the following laboratory data and studies,  CBC: No results for input(s): WBC, NEUTROABS, HGB, HCT, MCV, PLT in the last 168 hours. Basic Metabolic Panel: No results for input(s):  NA, K, CL, CO2, GLUCOSE, BUN, CREATININE, CALCIUM, MG, PHOS in the last 168 hours. Liver Function Tests: No results for input(s): AST, ALT, ALKPHOS, BILITOT, PROT, ALBUMIN in the last 168 hours. No results for input(s): LIPASE, AMYLASE in the last 168 hours. No results for input(s): AMMONIA in the last 168 hours. Cardiac Enzymes: No results for input(s): CKTOTAL, CKMB, CKMBINDEX, TROPONINI in the last 168 hours. BNP (last 3 results) No results for input(s): BNP in the last 8760 hours.  ProBNP (last 3 results) No results for input(s): PROBNP in the last 8760 hours.  CBG: No results for input(s): GLUCAP in the last 168 hours. No results found for this or any previous visit (from the past 240 hour(s)).   Studies: No results found.    Delfino Lovett, MD  [facility] 07/23/2020

## 2020-07-24 NOTE — Progress Notes (Signed)
PROGRESS NOTE  Lance Ray [id number]:[id number] DOB: 10/01/1970 DOA: 05/07/2020 PCP: Patient, No Pcp Per   LOS: 78 days   Brief narrative: As per HPI and review of records,  Lance Ray is a 49 y.o. malewith medical history significant fordementia, agitation and seizure was admitted hospital as a consultation from psychiatry for recurrent seizures.Patient was initally admitted on 6/30 under hospitalist service for generalized tonic clonic seizuredue to non-compliance with Depakote.He was loaded with IV lorazepam and keppra and admitted to PCU.Had negative MRI brain and CT head. Hospital course was complicated by psychosis and agitation and required precedex drip for agitation. Psychiatry wasconsulted and he wasultimatelydischarged to behavior health on 7/16 and wasplaced under involuntary commitmentperiod. While in the behavioral health unit, his sitter apparently heard the patient scream and witnessed patient have a generalized tonic-clonic seizurethat lastedabout 2 minutes was brought back to inpatient unit. Patient does not have capacity to make decisions for himself. He has been evaluated by thepsychiatrist who said patient has no capacity to make decision for himself.Patient is awaiting legal guardianship prior to discharge.    Assessment/Plan:   Principal Problem:   Seizure (HCC) Active Problems:   Psychosis (HCC)   Hypotension   Vitamin D deficiency   Impaired insight   Alcohol abuse  History of recurrent seizures.  Likely secondary to her noncompliance.  Continue lamotrigine, Keppra and Klonopin.  No further seizures have been noted in the hospital.   History of psychosis with agitation.  Patient appears to be more composed at this time.  Was seen by psychiatry during hospitalization.  Continue Haldol, Lamictal Klonopin, olanzapine.   Vitamin B12 and vitamin D deficiency.    Continue pyridoxine, thiamine, vitamin B12 and vitamin D.  DVT prophylaxis:  enoxaparin (LOVENOX) injection 40 mg Start: 05/08/20 0000   Code Status: Full code  Family Communication: None  Status is: Inpatient  Remains inpatient appropriate because: Awaiting for legal guardianship and placement.  Dispo:  Patient From: Home  Planned Disposition: Undetermined at this time.  Likely to group home  Expected discharge date: Unknown at this time  Medically stable for discharge: Yes  Consultants:  Psychiatry  Neurology   Procedures:  None  Antibiotics:   None  Anti-infectives (From admission, onward)   None      Subjective: Remains same.  No new issues Objective: Vitals:   07/24/20 0816 07/24/20 1112  BP: 106/77 112/77  Pulse: 82 69  Resp: 16 15  Temp: 97.6 F (36.4 C) 97.7 F (36.5 C)  SpO2: 94% 95%   No intake or output data in the 24 hours ending 07/24/20 1224 Filed Weights   05/29/20 0500 05/30/20 0450 05/31/20 0452  Weight: 66.8 kg 64.3 kg 65.5 kg   Body mass index is 21.31 kg/m.   Physical Exam:  General: Thinly built, not in obvious distress, communicative, not in distress. HENT:   No scleral pallor or icterus noted. Oral mucosa is moist.  Chest:  Clear breath sounds.  Diminished breath sounds bilaterally. No crackles or wheezes.  CVS: S1 &S2 heard. No murmur.  Regular rate and rhythm. Abdomen: Soft, nontender, nondistended.  Bowel sounds are heard.   Extremities: No cyanosis, clubbing or edema.  Peripheral pulses are palpable. Psych: Alert, awake and communicative, normal mood CNS:  No cranial nerve deficits.  Power equal in all extremities.   Skin: Warm and dry.  No rashes noted.   Data Review: I have personally reviewed the following laboratory data and studies,  CBC: No results  for input(s): WBC, NEUTROABS, HGB, HCT, MCV, PLT in the last 168 hours. Basic Metabolic Panel: No results for input(s): NA, K, CL, CO2, GLUCOSE, BUN, CREATININE, CALCIUM, MG, PHOS in the last 168 hours. Liver Function Tests: No results  for input(s): AST, ALT, ALKPHOS, BILITOT, PROT, ALBUMIN in the last 168 hours. No results for input(s): LIPASE, AMYLASE in the last 168 hours. No results for input(s): AMMONIA in the last 168 hours. Cardiac Enzymes: No results for input(s): CKTOTAL, CKMB, CKMBINDEX, TROPONINI in the last 168 hours. BNP (last 3 results) No results for input(s): BNP in the last 8760 hours.  ProBNP (last 3 results) No results for input(s): PROBNP in the last 8760 hours.  CBG: No results for input(s): GLUCAP in the last 168 hours. No results found for this or any previous visit (from the past 240 hour(s)).   Studies: No results found.    Delfino Lovett, MD  [facility] 07/24/2020

## 2020-07-25 NOTE — Progress Notes (Signed)
PROGRESS NOTE  SANFORD LINDBLAD NIO:[id number] DOB: [birth year] DOA: 05/07/2020 PCP: Patient, No Pcp Per   LOS: 79 days   Brief narrative: As per HPI and review of records,  Lance Ray is a 49 y.o. malewith medical history significant fordementia, agitation and seizure was admitted hospital as a consultation from psychiatry for recurrent seizures.Patient was initally admitted on 6/30 under hospitalist service for generalized tonic clonic seizuredue to non-compliance with Depakote.He was loaded with IV lorazepam and keppra and admitted to PCU.Had negative MRI brain and CT head. Hospital course was complicated by psychosis and agitation and required precedex drip for agitation. Psychiatry wasconsulted and he wasultimatelydischarged to behavior health on 7/16 and wasplaced under involuntary commitmentperiod. While in the behavioral health unit, his sitter apparently heard the patient scream and witnessed patient have a generalized tonic-clonic seizurethat lastedabout 2 minutes was brought back to inpatient unit. Patient does not have capacity to make decisions for himself. He has been evaluated by thepsychiatrist who said patient has no capacity to make decision for himself.Patient is awaiting legal guardianship prior to discharge.    Assessment/Plan:   Principal Problem:   Seizure (HCC) Active Problems:   Psychosis (HCC)   Hypotension   Vitamin D deficiency   Impaired insight   Alcohol abuse  History of recurrent seizures.  Likely secondary to her noncompliance.  Continue lamotrigine, Keppra and Klonopin.  No further seizures have been noted in the hospital.   History of psychosis with agitation. composed at this time.  Was seen by psychiatry during hospitalization.  Continue Haldol, Lamictal Klonopin, olanzapine.   Vitamin B12 and vitamin D deficiency.    Continue pyridoxine, thiamine, vitamin B12 and vitamin D.  DVT prophylaxis: enoxaparin (LOVENOX) injection 40 mg  Start: 05/08/20 0000   Code Status: Full code  Family Communication: None  Status is: Inpatient  Remains inpatient appropriate because: Awaiting for legal guardianship and placement.  Dispo:  Patient From: Home  Planned Disposition: Undetermined at this time.  Likely to group home  Expected discharge date: Unknown at this time  Medically stable for discharge: Yes  Consultants:  Psychiatry  Neurology   Procedures:  None  Antibiotics:  . None  Anti-infectives (From admission, onward)   None      Subjective: Remains same. No new issues.  Still waiting on placement and update from TOC Objective: Vitals:   07/25/20 0754 07/25/20 1127  BP: 101/77 116/81  Pulse: 76 82  Resp: 16 18  Temp: 97.9 F (36.6 C) 97.6 F (36.4 C)  SpO2: 97% 95%    Intake/Output Summary (Last 24 hours) at 07/25/2020 1150 Last data filed at 07/25/2020 1037 Gross per 24 hour  Intake 240 ml  Output --  Net 240 ml   Filed Weights   05/29/20 0500 05/30/20 0450 05/31/20 0452  Weight: 66.8 kg 64.3 kg 65.5 kg   Body mass index is 21.31 kg/m.   Physical Exam:  General: Thinly built, not in obvious distress, communicative, not in distress. HENT:   No scleral pallor or icterus noted. Oral mucosa is moist.  Chest:  Clear breath sounds.  Diminished breath sounds bilaterally. No crackles or wheezes.  CVS: S1 &S2 heard. No murmur.  Regular rate and rhythm. Abdomen: Soft, nontender, nondistended.  Bowel sounds are heard.   Extremities: No cyanosis, clubbing or edema.  Peripheral pulses are palpable. Psych: Alert, awake and communicative, normal mood CNS:  No cranial nerve deficits.  Power equal in all extremities.   Skin: Warm and dry.  No rashes noted.   Data Review: I have personally reviewed the following laboratory data and studies,  CBC: No results for input(s): WBC, NEUTROABS, HGB, HCT, MCV, PLT in the last 168 hours. Basic Metabolic Panel: No results for input(s): NA, K, CL,  CO2, GLUCOSE, BUN, CREATININE, CALCIUM, MG, PHOS in the last 168 hours. Liver Function Tests: No results for input(s): AST, ALT, ALKPHOS, BILITOT, PROT, ALBUMIN in the last 168 hours. No results for input(s): LIPASE, AMYLASE in the last 168 hours. No results for input(s): AMMONIA in the last 168 hours. Cardiac Enzymes: No results for input(s): CKTOTAL, CKMB, CKMBINDEX, TROPONINI in the last 168 hours. BNP (last 3 results) No results for input(s): BNP in the last 8760 hours.  ProBNP (last 3 results) No results for input(s): PROBNP in the last 8760 hours.  CBG: No results for input(s): GLUCAP in the last 168 hours. No results found for this or any previous visit (from the past 240 hour(s)).   Studies: No results found.    Delfino Lovett, MD  [facility] 07/25/2020

## 2020-07-26 NOTE — Plan of Care (Signed)
  Problem: Education: Goal: Knowledge of General Education information will improve Description Including pain rating scale, medication(s)/side effects and non-pharmacologic comfort measures Outcome: Progressing   

## 2020-07-26 NOTE — Progress Notes (Signed)
PROGRESS NOTE  Lance Ray [id number]:[id number] DOB: September 17, 1971 DOA: 05/07/2020 PCP: Patient, No Pcp Per   LOS: 80 days   Brief narrative: As per HPI and review of records,  Lance Ray is a 49 y.o. malewith medical history significant fordementia, agitation and seizure was admitted hospital as a consultation from psychiatry for recurrent seizures.Patient was initally admitted on 6/30 under hospitalist service for generalized tonic clonic seizuredue to non-compliance with Depakote.He was loaded with IV lorazepam and keppra and admitted to PCU.Had negative MRI brain and CT head. Hospital course was complicated by psychosis and agitation and required precedex drip for agitation. Psychiatry wasconsulted and he wasultimatelydischarged to behavior health on 7/16 and wasplaced under involuntary commitmentperiod. While in the behavioral health unit, his sitter apparently heard the patient scream and witnessed patient have a generalized tonic-clonic seizurethat lastedabout 2 minutes was brought back to inpatient unit. Patient does not have capacity to make decisions for himself. He has been evaluated by thepsychiatrist who said patient has no capacity to make decision for himself.Patient is awaiting legal guardianship prior to discharge.    Assessment/Plan:   Principal Problem:   Seizure (HCC) Active Problems:   Psychosis (HCC)   Hypotension   Vitamin D deficiency   Impaired insight   Alcohol abuse  History of recurrent seizures.  Likely secondary to her noncompliance.  Continue lamotrigine, Keppra and Klonopin.  No further seizures have been noted in the hospital.   History of psychosis with agitation. composed at this time.  Was seen by psychiatry during hospitalization.  Continue Haldol, Lamictal Klonopin, olanzapine.   Vitamin B12 and vitamin D deficiency.    Continue pyridoxine, thiamine, vitamin B12 and vitamin D.  DVT prophylaxis: enoxaparin (LOVENOX) injection 40 mg  Start: 05/08/20 0000   Code Status: Full code  Family Communication: None  Status is: Inpatient  Remains inpatient appropriate because: Awaiting for legal guardianship and placement.  Dispo:  Patient From: Home  Planned Disposition: Undetermined at this time.  Likely to group home  Expected discharge date: Unknown at this time  Medically stable for discharge: Yes  Consultants:  Psychiatry  Neurology   Procedures:  None  Antibiotics:  . None  Anti-infectives (From admission, onward)   None      Subjective: Remains same.  Objective: Vitals:   07/26/20 0749 07/26/20 1234  BP: 109/77 121/70  Pulse: 70 68  Resp: 16 15  Temp: 97.6 F (36.4 C) 98.3 F (36.8 C)  SpO2: 98% 98%    Intake/Output Summary (Last 24 hours) at 07/26/2020 1240 Last data filed at 07/25/2020 1900 Gross per 24 hour  Intake 120 ml  Output --  Net 120 ml   Filed Weights   05/29/20 0500 05/30/20 0450 05/31/20 0452  Weight: 66.8 kg 64.3 kg 65.5 kg   Body mass index is 21.31 kg/m.   Physical Exam:  General: Thinly built, not in obvious distress, communicative, not in distress. HENT:   No scleral pallor or icterus noted. Oral mucosa is moist.  Chest:  Clear breath sounds.  Diminished breath sounds bilaterally. No crackles or wheezes.  CVS: S1 &S2 heard. No murmur.  Regular rate and rhythm. Abdomen: Soft, nontender, nondistended.  Bowel sounds are heard.   Extremities: No cyanosis, clubbing or edema.  Peripheral pulses are palpable. Psych: Alert, awake and communicative, normal mood CNS:  No cranial nerve deficits.  Power equal in all extremities.   Skin: Warm and dry.  No rashes noted.   Data Review: I have personally  reviewed the following laboratory data and studies,  CBC: No results for input(s): WBC, NEUTROABS, HGB, HCT, MCV, PLT in the last 168 hours. Basic Metabolic Panel: No results for input(s): NA, K, CL, CO2, GLUCOSE, BUN, CREATININE, CALCIUM, MG, PHOS in the last  168 hours. Liver Function Tests: No results for input(s): AST, ALT, ALKPHOS, BILITOT, PROT, ALBUMIN in the last 168 hours. No results for input(s): LIPASE, AMYLASE in the last 168 hours. No results for input(s): AMMONIA in the last 168 hours. Cardiac Enzymes: No results for input(s): CKTOTAL, CKMB, CKMBINDEX, TROPONINI in the last 168 hours. BNP (last 3 results) No results for input(s): BNP in the last 8760 hours.  ProBNP (last 3 results) No results for input(s): PROBNP in the last 8760 hours.  CBG: No results for input(s): GLUCAP in the last 168 hours. No results found for this or any previous visit (from the past 240 hour(s)).   Studies: No results found.    Delfino Lovett, MD  [facility] 07/26/2020

## 2020-07-27 LAB — CBC
HCT: 38.9 % — ABNORMAL LOW (ref 39.0–52.0)
Hemoglobin: 13.5 g/dL (ref 13.0–17.0)
MCH: 29.2 pg (ref 26.0–34.0)
MCHC: 34.7 g/dL (ref 30.0–36.0)
MCV: 84 fL (ref 80.0–100.0)
Platelets: 257 10*3/uL (ref 150–400)
RBC: 4.63 MIL/uL (ref 4.22–5.81)
RDW: 13 % (ref 11.5–15.5)
WBC: 6.1 10*3/uL (ref 4.0–10.5)
nRBC: 0 % (ref 0.0–0.2)

## 2020-07-27 LAB — CREATININE, SERUM
Creatinine, Ser: 1.07 mg/dL (ref 0.61–1.24)
GFR, Estimated: >60 mL/min (ref >60)

## 2020-07-27 NOTE — Progress Notes (Signed)
[name]:[id number] DOB: 05-27-1971 DOA: 05/07/2020 PCP: Patient, No Pcp Per   LOS: 81 days   Brief narrative: As per HPI and review of records,  Lance Ray is a 49 y.o. malewith medical history significant fordementia, agitation and seizure was admitted hospital as a consultation from psychiatry for recurrent seizures.Patient was initally admitted on 6/30 under hospitalist service for generalized tonic clonic seizuredue to non-compliance with Depakote.He was loaded with IV lorazepam and keppra and admitted to PCU.Had negative MRI brain and CT head. Hospital course was complicated by psychosis and agitation and required precedex drip for agitation. Psychiatry wasconsulted and he wasultimatelydischarged to behavior health on 7/16 and wasplaced under involuntary commitmentperiod. While in the behavioral health unit, his sitter apparently heard the patient scream and witnessed patient have a generalized tonic-clonic seizurethat lastedabout 2 minutes was brought back to inpatient unit. Patient does not have capacity to make decisions for himself. He has been evaluated by thepsychiatrist who said patient has no capacity to make decision for himself.Patient is awaiting legal guardianship prior to discharge.    Assessment/Plan:   Principal Problem:   Seizure (HCC) Active Problems:   Psychosis (HCC)   Hypotension   Vitamin D deficiency   Impaired insight   Alcohol abuse  History of recurrent seizures.  Likely secondary to her noncompliance.  Continue lamotrigine, Keppra and Klonopin.  No further seizures have been noted in the hospital.   History of psychosis with agitation. composed at this time.  Was seen by psychiatry during hospitalization.  Continue Haldol, Lamictal Klonopin, olanzapine.   Vitamin B12 and vitamin D deficiency.    Continue pyridoxine, thiamine, vitamin B12 and vitamin D.  We will get weekly labs  DVT prophylaxis: enoxaparin  (LOVENOX) injection 40 mg Start: 05/08/20 0000   Code Status: Full code  Family Communication: None  Status is: Inpatient  Remains inpatient appropriate because: Awaiting for legal guardianship and placement.  Dispo:  Patient From: Home  Planned Disposition: Undetermined at this time.  Likely to group home  Expected discharge date: Unknown at this time.  Long length of stay with difficult placement  Medically stable for discharge: Yes  Consultants:  Psychiatry  Neurology   Procedures:  None  Antibiotics:  . None  Anti-infectives (From admission, onward)   None      Subjective: Remains same.  Still waiting on placement Objective: Vitals:   07/27/20 0749 07/27/20 1144  BP: 95/69 105/66  Pulse: 74 68  Resp: 15 15  Temp: 97.6 F (36.4 C) 97.7 F (36.5 C)  SpO2: 98% 94%    Intake/Output Summary (Last 24 hours) at 07/27/2020 1405 Last data filed at 07/27/2020 1035 Gross per 24 hour  Intake 240 ml  Output --  Net 240 ml   Filed Weights   05/29/20 0500 05/30/20 0450 05/31/20 0452  Weight: 66.8 kg 64.3 kg 65.5 kg   Body mass index is 21.31 kg/m.   Physical Exam:  General: Thinly built, not in obvious distress, communicative, not in distress. HENT:   No scleral pallor or icterus noted. Oral mucosa is moist.  Chest:  Clear breath sounds.  Diminished breath sounds bilaterally. No crackles or wheezes.  CVS: S1 &S2 heard. No murmur.  Regular rate and rhythm. Abdomen: Soft, nontender, nondistended.  Bowel sounds are heard.   Extremities: No cyanosis, clubbing or edema.  Peripheral pulses are palpable. Psych: Alert, awake and communicative, normal mood CNS:  No cranial nerve deficits.  Power equal in all  extremities.   Skin: Warm and dry.  No rashes noted.   Data Review: I have personally reviewed the following laboratory data and studies,  CBC: Recent Labs  Lab 07/27/20 0505  WBC 6.1  HGB 13.5  HCT 38.9*  MCV 84.0  PLT 257   Basic Metabolic  Panel: Recent Labs  Lab 07/27/20 0505  CREATININE 1.07   Liver Function Tests: No results for input(s): AST, ALT, ALKPHOS, BILITOT, PROT, ALBUMIN in the last 168 hours. No results for input(s): LIPASE, AMYLASE in the last 168 hours. No results for input(s): AMMONIA in the last 168 hours. Cardiac Enzymes: No results for input(s): CKTOTAL, CKMB, CKMBINDEX, TROPONINI in the last 168 hours. BNP (last 3 results) No results for input(s): BNP in the last 8760 hours.  ProBNP (last 3 results) No results for input(s): PROBNP in the last 8760 hours.  CBG: No results for input(s): GLUCAP in the last 168 hours. No results found for this or any previous visit (from the past 240 hour(s)).   Studies: No results found.    Delfino Lovett, MD  [facility] 07/27/2020

## 2020-07-28 NOTE — Progress Notes (Signed)
PROGRESS NOTE    Lance Ray  HCW:[id number] DOB: 04/24/1971 DOA: 05/07/2020 PCP: Patient, No Pcp Per     Brief Narrative:  Lance Picket [name] a 49 y.o.malewith medical history significant fordementia, agitation and seizure was admitted hospital as a consultation from psychiatry for recurrent seizures.Patient was initally admitted on 6/30 under hospitalist service for generalized tonic clonic seizuredue to non-compliance with Depakote.He was loaded with IV lorazepam and keppra and admitted to PCU.Had negative MRI brain and CT head. Hospital course was complicated by psychosis and agitation and required precedex drip for agitation. Psychiatry wasconsulted and he wasultimatelydischarged to behavior health on 7/16 and wasplaced under involuntary commitmentperiod. While in the behavioral health unit, his sitter apparently heard the patient scream and witnessed patient have a generalized tonic-clonic seizurethat lastedabout 2 minutes was brought back to inpatient unit. Patient does not have capacity to make decisions for himself. He has been evaluated by thepsychiatrist who said patient has no capacity to make decision for himself.Patient is awaiting legal guardianship prior to discharge.   New events last 24 hours / Subjective: No new events reported. No complaints today.   Assessment & Plan:   Principal Problem:   Seizure (HCC) Active Problems:   Psychosis (HCC)   Hypotension   Vitamin D deficiency   Impaired insight   Alcohol abuse   History of recurrent seizures -Likely secondary to noncompliance.  Continue lamotrigine, Keppra and Klonopin.  No further seizures have been noted in the hospital.    History of psychosis with agitation -Was seen by psychiatry during hospitalization.  Continue Haldol, Lamictal Klonopin, olanzapine.    Vitamin B12 and vitamin D deficiency -Continue pyridoxine, thiamine, vitamin B12 and vitamin D.   DVT prophylaxis:  enoxaparin  (LOVENOX) injection 40 mg Start: 05/08/20 0000  Code Status: Full Family Communication: None at bedside Disposition Plan:  Status is: Inpatient  Remains inpatient appropriate because:Unsafe d/c plan   Dispo:  Patient From:   Home  Planned Disposition:   Group home  Expected discharge date: >3 days  Medically stable for discharge:   Yes. Awaiting legal guardianship and placement.     Antimicrobials:  Anti-infectives (From admission, onward)   None        Objective: Vitals:   07/27/20 2014 07/27/20 2337 07/28/20 0409 07/28/20 0803  BP: 106/82 114/79 93/71 101/75  Pulse: 83 86 75 68  Resp: 18 18 18 16   Temp: 98.5 F (36.9 C) 97.9 F (36.6 C) 98.1 F (36.7 C) 97.7 F (36.5 C)  TempSrc: Oral Oral Oral Oral  SpO2: 96% 95% 97% 94%  Weight:      Height:        Intake/Output Summary (Last 24 hours) at 07/28/2020 1114 Last data filed at 07/28/2020 0900 Gross per 24 hour  Intake 120 ml  Output --  Net 120 ml   Filed Weights   05/29/20 0500 05/30/20 0450 05/31/20 0452  Weight: 66.8 kg 64.3 kg 65.5 kg    Examination:  General exam: Appears calm and comfortable     Data Reviewed: I have personally reviewed following labs and imaging studies  CBC: Recent Labs  Lab 07/27/20 0505  WBC 6.1  HGB 13.5  HCT 38.9*  MCV 84.0  PLT 257   Basic Metabolic Panel: Recent Labs  Lab 07/27/20 0505  CREATININE 1.07   GFR: Estimated Creatinine Clearance: 76.5 mL/min (by C-G formula based on SCr of 1.07 mg/dL). Liver Function Tests: No results for input(s): AST, ALT, ALKPHOS, BILITOT, PROT, ALBUMIN in  the last 168 hours. No results for input(s): LIPASE, AMYLASE in the last 168 hours. No results for input(s): AMMONIA in the last 168 hours. Coagulation Profile: No results for input(s): INR, PROTIME in the last 168 hours. Cardiac Enzymes: No results for input(s): CKTOTAL, CKMB, CKMBINDEX, TROPONINI in the last 168 hours. BNP (last 3 results) No results for input(s):  PROBNP in the last 8760 hours. HbA1C: No results for input(s): HGBA1C in the last 72 hours. CBG: No results for input(s): GLUCAP in the last 168 hours. Lipid Profile: No results for input(s): CHOL, HDL, LDLCALC, TRIG, CHOLHDL, LDLDIRECT in the last 72 hours. Thyroid Function Tests: No results for input(s): TSH, T4TOTAL, FREET4, T3FREE, THYROIDAB in the last 72 hours. Anemia Panel: No results for input(s): VITAMINB12, FOLATE, FERRITIN, TIBC, IRON, RETICCTPCT in the last 72 hours. Sepsis Labs: No results for input(s): PROCALCITON, LATICACIDVEN in the last 168 hours.  No results found for this or any previous visit (from the past 240 hour(s)).    Radiology Studies: No results found.    Scheduled Meds: . cholecalciferol  1,000 Units Oral Daily  . clonazePAM  0.5 mg Oral BID  . enoxaparin (LOVENOX) injection  40 mg Subcutaneous Q24H  . haloperidol  0.5 mg Oral BID  . lamoTRIgine  100 mg Oral BID   And  . lamoTRIgine  75 mg Oral BID  . levETIRAcetam  1,500 mg Oral BID  . melatonin  5 mg Oral QHS  . OLANZapine  5 mg Oral QHS  . vitamin B-6  50 mg Oral Daily  . thiamine  100 mg Oral Daily  . vitamin B-12  1,000 mcg Oral Daily   Continuous Infusions:   LOS: 82 days      Time spent: 20 minutes   Noralee Stain, DO [facility] 07/28/2020, 11:14 AM   Available via Epic secure chat 7am-7pm After these hours, please refer to coverage provider listed on [email or web address]

## 2020-07-28 NOTE — Plan of Care (Signed)
  Problem: Education: Goal: Knowledge of General Education information will improve Description Including pain rating scale, medication(s)/side effects and non-pharmacologic comfort measures Outcome: Progressing   

## 2020-07-28 NOTE — Plan of Care (Signed)
  Problem: Education: Goal: Knowledge of General Education information will improve Description: Including pain rating scale, medication(s)/side effects and non-pharmacologic comfort measures 07/28/2020 0444 by Melburn Popper, RN Outcome: Not Progressing 07/28/2020 0348 by Melburn Popper, RN Outcome: Progressing

## 2020-07-28 NOTE — TOC Progression Note (Signed)
Transition of Care [facility]) - Progression Note    Patient Details  Name: Lance Ray MRN: [id number] Date of Birth: Oct 05, 1970  Transition of [facility]) CM/SW Contact  Trenton Founds, RN Phone Number: 07/28/2020, 2:40 PM  Clinical Narrative:   RNCM reached out and left VM and text for patient's sister to get update.          Expected Discharge Plan and Services                                                 Social Determinants of Health (SDOH) Interventions    Readmission Risk Interventions Readmission Risk Prevention Plan 03/29/2020  Transportation Screening Complete  HRI or Home Care Consult Complete  Social Work Consult for Recovery Care Planning/Counseling Complete  Palliative Care Screening Not Applicable  Medication Review Oceanographer) Referral to Pharmacy  Some recent data might be hidden

## 2020-07-29 NOTE — Progress Notes (Signed)
Lance NOTE    Lance Ray  WUJ:[id number] DOB: July 27, 1971 DOA: 05/07/2020 PCP: Patient, No Pcp Per     Brief Narrative:  Lance Picket [name] a 49 y.o.malewith medical history significant fordementia, agitation and seizure was admitted hospital as a consultation from psychiatry for recurrent seizures.Patient was initally admitted on 6/30 under hospitalist service for generalized tonic clonic seizuredue to non-compliance with Depakote.He was loaded with IV lorazepam and keppra and admitted to PCU.Had negative MRI brain and CT head. Hospital course was complicated by psychosis and agitation and required precedex drip for agitation. Psychiatry wasconsulted and he wasultimatelydischarged to behavior health on 7/16 and wasplaced under involuntary commitmentperiod. While in the behavioral health unit, his sitter apparently heard the patient scream and witnessed patient have a generalized tonic-clonic seizurethat lastedabout 2 minutes was brought back to inpatient unit. Patient does not have capacity to make decisions for himself. He has been evaluated by thepsychiatrist who said patient has no capacity to make decision for himself.Patient is awaiting legal guardianship prior to discharge.   New events last 24 hours / Subjective: No complaints on examination.  Assessment & Plan:   Principal Problem:   Seizure (HCC) Active Problems:   Psychosis (HCC)   Hypotension   Vitamin D deficiency   Impaired insight   Alcohol abuse   History of recurrent seizures -Likely secondary to noncompliance.  Continue lamotrigine, Keppra and Klonopin.  No further seizures have been noted in the hospital.    History of psychosis with agitation -Was seen by psychiatry during hospitalization.  Continue Haldol, Lamictal Klonopin, olanzapine.    Vitamin B12 and vitamin D deficiency -Continue pyridoxine, thiamine, vitamin B12 and vitamin D.   DVT prophylaxis:  enoxaparin (LOVENOX)  injection 40 mg Start: 05/08/20 0000  Code Status: Full Family Communication: None at bedside Disposition Plan:  Status is: Inpatient  Remains inpatient appropriate because:Unsafe d/c plan   Dispo:  Patient From:   Home  Planned Disposition:   Group home  Expected discharge date: >3 days  Medically stable for discharge:   Yes. Awaiting legal guardianship and placement.     Antimicrobials:  Anti-infectives (From admission, onward)   None       Objective: Vitals:   07/28/20 2000 07/28/20 2333 07/29/20 0415 07/29/20 0804  BP: 104/72 107/79 93/68 104/80  Pulse: 85 76 73 68  Resp: 18 18 18 15   Temp: 98.9 F (37.2 C) 98.9 F (37.2 C) 98.6 F (37 C) 97.8 F (36.6 C)  TempSrc: Oral Oral Oral Oral  SpO2: 97% 96% 98% 95%  Weight:      Height:        Intake/Output Summary (Last 24 hours) at 07/29/2020 0952 Last data filed at 07/28/2020 1811 Gross per 24 hour  Intake 480 ml  Output --  Net 480 ml   Filed Weights   05/29/20 0500 05/30/20 0450 05/31/20 0452  Weight: 66.8 kg 64.3 kg 65.5 kg    Examination: General exam: Appears calm and comfortable   Data Reviewed: I have personally reviewed following labs and imaging studies  CBC: Recent Labs  Lab 07/27/20 0505  WBC 6.1  HGB 13.5  HCT 38.9*  MCV 84.0  PLT 257   Basic Metabolic Panel: Recent Labs  Lab 07/27/20 0505  CREATININE 1.07   GFR: Estimated Creatinine Clearance: 76.5 mL/min (by C-G formula based on SCr of 1.07 mg/dL). Liver Function Tests: No results for input(s): AST, ALT, ALKPHOS, BILITOT, PROT, ALBUMIN in the last 168 hours. No results for input(s):  LIPASE, AMYLASE in the last 168 hours. No results for input(s): AMMONIA in the last 168 hours. Coagulation Profile: No results for input(s): INR, PROTIME in the last 168 hours. Cardiac Enzymes: No results for input(s): CKTOTAL, CKMB, CKMBINDEX, TROPONINI in the last 168 hours. BNP (last 3 results) No results for input(s): PROBNP in the last  8760 hours. HbA1C: No results for input(s): HGBA1C in the last 72 hours. CBG: No results for input(s): GLUCAP in the last 168 hours. Lipid Profile: No results for input(s): CHOL, HDL, LDLCALC, TRIG, CHOLHDL, LDLDIRECT in the last 72 hours. Thyroid Function Tests: No results for input(s): TSH, T4TOTAL, FREET4, T3FREE, THYROIDAB in the last 72 hours. Anemia Panel: No results for input(s): VITAMINB12, FOLATE, FERRITIN, TIBC, IRON, RETICCTPCT in the last 72 hours. Sepsis Labs: No results for input(s): PROCALCITON, LATICACIDVEN in the last 168 hours.  No results found for this or any previous visit (from the past 240 hour(s)).    Radiology Studies: No results found.    Scheduled Meds: . cholecalciferol  1,000 Units Oral Daily  . clonazePAM  0.5 mg Oral BID  . enoxaparin (LOVENOX) injection  40 mg Subcutaneous Q24H  . haloperidol  0.5 mg Oral BID  . lamoTRIgine  100 mg Oral BID   And  . lamoTRIgine  75 mg Oral BID  . levETIRAcetam  1,500 mg Oral BID  . melatonin  5 mg Oral QHS  . OLANZapine  5 mg Oral QHS  . vitamin B-6  50 mg Oral Daily  . thiamine  100 mg Oral Daily  . vitamin B-12  1,000 mcg Oral Daily   Continuous Infusions:   LOS: 83 days      Time spent: 10 minutes   Noralee Stain, DO [facility] 07/29/2020, 9:52 AM   Available via Epic secure chat 7am-7pm After these hours, please refer to coverage provider listed on [email or web address]

## 2020-07-29 NOTE — TOC Progression Note (Signed)
Transition of Care [facility]) - Progression Note    Patient Details  Name: Lance Ray MRN: [id number] Date of Birth: December 23, 1970  Transition of Care [facility]) CM/SW Contact  Trenton Founds, RN Phone Number: 07/29/2020, 3:07 PM  Clinical Narrative:   Attempted to reach patient's sister for update, no answer and unable to leave VM due to mailbox being full. Text message sent.          Expected Discharge Plan and Services                                                 Social Determinants of Health (SDOH) Interventions    Readmission Risk Interventions Readmission Risk Prevention Plan 03/29/2020  Transportation Screening Complete  HRI or Home Care Consult Complete  Social Work Consult for Recovery Care Planning/Counseling Complete  Palliative Care Screening Not Applicable  Medication Review Oceanographer) Referral to Pharmacy  Some recent data might be hidden

## 2020-07-29 NOTE — Plan of Care (Signed)
Problem: Activity: Goal: Risk for activity intolerance will decrease Outcome: Progressing   Problem: Education: Goal: Knowledge of General Education information will improve Description: Including pain rating scale, medication(s)/side effects and non-pharmacologic comfort measures Outcome: Progressing   Problem: Health Behavior/Discharge Planning: Goal: Ability to manage health-related needs will improve Outcome: Progressing   Problem: Clinical Measurements: Goal: Ability to maintain clinical measurements within normal limits will improve Outcome: Progressing Goal: Will remain free from infection Outcome: Progressing Goal: Diagnostic test results will improve Outcome: Progressing Goal: Respiratory complications will improve Outcome: Progressing Goal: Cardiovascular complication will be avoided Outcome: Progressing   Problem: Nutrition: Goal: Adequate nutrition will be maintained Outcome: Progressing   Problem: Coping: Goal: Level of anxiety will decrease Outcome: Progressing   Problem: Elimination: Goal: Will not experience complications related to bowel motility Outcome: Progressing Goal: Will not experience complications related to urinary retention Outcome: Progressing   Problem: Pain Managment: Goal: General experience of comfort will improve Outcome: Progressing   Problem: Safety: Goal: Ability to remain free from injury will improve Outcome: Progressing   Problem: Skin Integrity: Goal: Risk for impaired skin integrity will decrease Outcome: Progressing   Problem: Education: Goal: Knowledge of General Education information will improve Description: Including pain rating scale, medication(s)/side effects and non-pharmacologic comfort measures Outcome: Progressing   Problem: Education: Goal: Knowledge of General Education information will improve Description: Including pain rating scale, medication(s)/side effects and non-pharmacologic comfort  measures Outcome: Progressing   Problem: Health Behavior/Discharge Planning: Goal: Ability to manage health-related needs will improve Outcome: Progressing   Problem: Clinical Measurements: Goal: Ability to maintain clinical measurements within normal limits will improve Outcome: Progressing Goal: Will remain free from infection Outcome: Progressing Goal: Diagnostic test results will improve Outcome: Progressing Goal: Respiratory complications will improve Outcome: Progressing Goal: Cardiovascular complication will be avoided Outcome: Progressing   Problem: Activity: Goal: Risk for activity intolerance will decrease Outcome: Progressing   Problem: Nutrition: Goal: Adequate nutrition will be maintained Outcome: Progressing   Problem: Coping: Goal: Level of anxiety will decrease Outcome: Progressing   Problem: Elimination: Goal: Will not experience complications related to bowel motility Outcome: Progressing Goal: Will not experience complications related to urinary retention Outcome: Progressing   Problem: Pain Managment: Goal: General experience of comfort will improve Outcome: Progressing   Problem: Safety: Goal: Ability to remain free from injury will improve Outcome: Progressing   Problem: Skin Integrity: Goal: Risk for impaired skin integrity will decrease Outcome: Progressing   Problem: Education: Goal: Expressions of having a comfortable level of knowledge regarding the disease process will increase Outcome: Progressing   Problem: Coping: Goal: Ability to adjust to condition or change in health will improve Outcome: Progressing Goal: Ability to identify appropriate support needs will improve Outcome: Progressing   Problem: Health Behavior/Discharge Planning: Goal: Compliance with prescribed medication regimen will improve Outcome: Progressing   Problem: Self-Concept: Goal: Ability to verbalize feelings about condition will improve Outcome:  Progressing   Problem: Education: Goal: Expressions of having a comfortable level of knowledge regarding the disease process will increase Outcome: Progressing   Problem: Coping: Goal: Ability to adjust to condition or change in health will improve Outcome: Progressing Goal: Ability to identify appropriate support needs will improve Outcome: Progressing   Problem: Health Behavior/Discharge Planning: Goal: Compliance with prescribed medication regimen will improve Outcome: Progressing   Problem: Medication: Goal: Risk for medication side effects will decrease Outcome: Progressing   Problem: Clinical Measurements: Goal: Complications related to the disease process, condition or treatment will be avoided or minimized  Outcome: Progressing Goal: Diagnostic test results will improve Outcome: Progressing   Problem: Safety: Goal: Verbalization of understanding the information provided will improve Outcome: Progressing   Problem: Self-Concept: Goal: Level of anxiety will decrease Outcome: Progressing Goal: Ability to verbalize feelings about condition will improve Outcome: Progressing   Problem: Education: Goal: Expressions of having a comfortable level of knowledge regarding the disease process will increase Outcome: Progressing   Problem: Coping: Goal: Ability to adjust to condition or change in health will improve Outcome: Progressing Goal: Ability to identify appropriate support needs will improve Outcome: Progressing   Problem: Health Behavior/Discharge Planning: Goal: Compliance with prescribed medication regimen will improve Outcome: Progressing   Problem: Clinical Measurements: Goal: Complications related to the disease process, condition or treatment will be avoided or minimized Outcome: Progressing Goal: Diagnostic test results will improve Outcome: Progressing   Problem: Safety: Goal: Verbalization of understanding the information provided will  improve Outcome: Progressing   Problem: Self-Concept: Goal: Level of anxiety will decrease Outcome: Progressing Goal: Ability to verbalize feelings about condition will improve Outcome: Progressing

## 2020-07-30 NOTE — Plan of Care (Signed)
  Problem: Education: Goal: Knowledge of General Education information will improve Description Including pain rating scale, medication(s)/side effects and non-pharmacologic comfort measures Outcome: Progressing   

## 2020-07-30 NOTE — Plan of Care (Signed)
  Problem: Activity: Goal: Risk for activity intolerance will decrease Outcome: Progressing   Problem: Education: Goal: Knowledge of General Education information will improve Description: Including pain rating scale, medication(s)/side effects and non-pharmacologic comfort measures Outcome: Progressing   Problem: Health Behavior/Discharge Planning: Goal: Ability to manage health-related needs will improve Outcome: Progressing   Problem: Clinical Measurements: Goal: Ability to maintain clinical measurements within normal limits will improve Outcome: Progressing Goal: Will remain free from infection Outcome: Progressing Goal: Diagnostic test results will improve Outcome: Progressing Goal: Respiratory complications will improve Outcome: Progressing Goal: Cardiovascular complication will be avoided Outcome: Progressing   Problem: Nutrition: Goal: Adequate nutrition will be maintained Outcome: Progressing   Problem: Coping: Goal: Level of anxiety will decrease Outcome: Progressing   Problem: Elimination: Goal: Will not experience complications related to bowel motility Outcome: Progressing Goal: Will not experience complications related to urinary retention Outcome: Progressing   Problem: Pain Managment: Goal: General experience of comfort will improve Outcome: Progressing   Problem: Safety: Goal: Ability to remain free from injury will improve Outcome: Progressing   Problem: Skin Integrity: Goal: Risk for impaired skin integrity will decrease Outcome: Progressing   Problem: Education: Goal: Knowledge of General Education information will improve Description: Including pain rating scale, medication(s)/side effects and non-pharmacologic comfort measures Outcome: Progressing   Problem: Education: Goal: Knowledge of General Education information will improve Description: Including pain rating scale, medication(s)/side effects and non-pharmacologic comfort  measures Outcome: Progressing   Problem: Health Behavior/Discharge Planning: Goal: Ability to manage health-related needs will improve Outcome: Progressing   Problem: Clinical Measurements: Goal: Ability to maintain clinical measurements within normal limits will improve Outcome: Progressing Goal: Will remain free from infection Outcome: Progressing Goal: Diagnostic test results will improve Outcome: Progressing Goal: Respiratory complications will improve Outcome: Progressing Goal: Cardiovascular complication will be avoided Outcome: Progressing   Problem: Activity: Goal: Risk for activity intolerance will decrease Outcome: Progressing   Problem: Nutrition: Goal: Adequate nutrition will be maintained Outcome: Progressing   Problem: Coping: Goal: Level of anxiety will decrease Outcome: Progressing   Problem: Elimination: Goal: Will not experience complications related to bowel motility Outcome: Progressing Goal: Will not experience complications related to urinary retention Outcome: Progressing   Problem: Pain Managment: Goal: General experience of comfort will improve Outcome: Progressing   Problem: Safety: Goal: Ability to remain free from injury will improve Outcome: Progressing   Problem: Skin Integrity: Goal: Risk for impaired skin integrity will decrease Outcome: Progressing   Problem: Education: Goal: Expressions of having a comfortable level of knowledge regarding the disease process will increase Outcome: Progressing   Problem: Coping: Goal: Ability to adjust to condition or change in health will improve Outcome: Progressing Goal: Ability to identify appropriate support needs will improve Outcome: Progressing   Problem: Health Behavior/Discharge Planning: Goal: Compliance with prescribed medication regimen will improve Outcome: Progressing   Problem: Safety: Goal: Verbalization of understanding the information provided will improve Outcome:  Progressing

## 2020-07-30 NOTE — Progress Notes (Signed)
PROGRESS NOTE    Lance Ray  PPI:[id number] DOB: 28-Nov-1970 DOA: 05/07/2020 PCP: Patient, No Pcp Per     Brief Narrative:  Lance Picket [name] a 49 y.o.malewith medical history significant fordementia, agitation and seizure was admitted hospital as a consultation from psychiatry for recurrent seizures.Patient was initally admitted on 6/30 under hospitalist service for generalized tonic clonic seizuredue to non-compliance with Depakote.He was loaded with IV lorazepam and keppra and admitted to PCU.Had negative MRI brain and CT head. Hospital course was complicated by psychosis and agitation and required precedex drip for agitation. Psychiatry wasconsulted and he wasultimatelydischarged to behavior health on 7/16 and wasplaced under involuntary commitmentperiod. While in the behavioral health unit, his sitter apparently heard the patient scream and witnessed patient have a generalized tonic-clonic seizurethat lastedabout 2 minutes was brought back to inpatient unit. Patient does not have capacity to make decisions for himself. He has been evaluated by thepsychiatrist who said patient has no capacity to make decision for himself.Patient is awaiting legal guardianship prior to discharge.   New events last 24 hours / Subjective: No new issues or complaints voiced by the patient  Assessment & Plan:   Principal Problem:   Seizure (HCC) Active Problems:   Psychosis (HCC)   Hypotension   Vitamin D deficiency   Impaired insight   Alcohol abuse   History of recurrent seizures -Likely secondary to noncompliance.  Continue lamotrigine, Keppra and Klonopin.  No further seizures have been noted in the hospital.    History of psychosis with agitation -Was seen by psychiatry during hospitalization.  Continue Haldol, Lamictal Klonopin, olanzapine.    Vitamin B12 and vitamin D deficiency -Continue pyridoxine, thiamine, vitamin B12 and vitamin D.   DVT prophylaxis:    enoxaparin (LOVENOX) injection 40 mg Start: 05/08/20 0000  Code Status: Full Family Communication: None at bedside Disposition Plan:  Status is: Inpatient  Remains inpatient appropriate because:Unsafe d/c plan   Dispo:  Patient From:   Home  Planned Disposition:   Group home  Expected discharge date: >3 days  Medically stable for discharge:   Yes. Awaiting legal guardianship and placement.     Antimicrobials:  Anti-infectives (From admission, onward)   None       Objective: Vitals:   07/29/20 2035 07/29/20 2336 07/30/20 0448 07/30/20 0748  BP: 114/75 110/78 109/81 109/74  Pulse: 89 84 72 69  Resp: 16 16 15 16   Temp: 98.3 F (36.8 C) 98.2 F (36.8 C) 98.4 F (36.9 C) 97.8 F (36.6 C)  TempSrc: Oral Oral    SpO2: 96% 96% 95% 96%  Weight:      Height:        Intake/Output Summary (Last 24 hours) at 07/30/2020 1001 Last data filed at 07/30/2020 0448 Gross per 24 hour  Intake 800 ml  Output 0 ml  Net 800 ml   Filed Weights   05/29/20 0500 05/30/20 0450 05/31/20 0452  Weight: 66.8 kg 64.3 kg 65.5 kg    Examination: General exam: Appears calm and comfortable, disheveled  Data Reviewed: I have personally reviewed following labs and imaging studies  CBC: Recent Labs  Lab 07/27/20 0505  WBC 6.1  HGB 13.5  HCT 38.9*  MCV 84.0  PLT 257   Basic Metabolic Panel: Recent Labs  Lab 07/27/20 0505  CREATININE 1.07   GFR: Estimated Creatinine Clearance: 76.5 mL/min (by C-G formula based on SCr of 1.07 mg/dL). Liver Function Tests: No results for input(s): AST, ALT, ALKPHOS, BILITOT, PROT, ALBUMIN in the  last 168 hours. No results for input(s): LIPASE, AMYLASE in the last 168 hours. No results for input(s): AMMONIA in the last 168 hours. Coagulation Profile: No results for input(s): INR, PROTIME in the last 168 hours. Cardiac Enzymes: No results for input(s): CKTOTAL, CKMB, CKMBINDEX, TROPONINI in the last 168 hours. BNP (last 3 results) No results for  input(s): PROBNP in the last 8760 hours. HbA1C: No results for input(s): HGBA1C in the last 72 hours. CBG: No results for input(s): GLUCAP in the last 168 hours. Lipid Profile: No results for input(s): CHOL, HDL, LDLCALC, TRIG, CHOLHDL, LDLDIRECT in the last 72 hours. Thyroid Function Tests: No results for input(s): TSH, T4TOTAL, FREET4, T3FREE, THYROIDAB in the last 72 hours. Anemia Panel: No results for input(s): VITAMINB12, FOLATE, FERRITIN, TIBC, IRON, RETICCTPCT in the last 72 hours. Sepsis Labs: No results for input(s): PROCALCITON, LATICACIDVEN in the last 168 hours.  No results found for this or any previous visit (from the past 240 hour(s)).    Radiology Studies: No results found.    Scheduled Meds: . cholecalciferol  1,000 Units Oral Daily  . clonazePAM  0.5 mg Oral BID  . enoxaparin (LOVENOX) injection  40 mg Subcutaneous Q24H  . haloperidol  0.5 mg Oral BID  . lamoTRIgine  100 mg Oral BID   And  . lamoTRIgine  75 mg Oral BID  . levETIRAcetam  1,500 mg Oral BID  . melatonin  5 mg Oral QHS  . OLANZapine  5 mg Oral QHS  . vitamin B-6  50 mg Oral Daily  . thiamine  100 mg Oral Daily  . vitamin B-12  1,000 mcg Oral Daily   Continuous Infusions:   LOS: 84 days      Time spent: 10 minutes   Noralee Stain, DO [facility] 07/30/2020, 10:01 AM   Available via Epic secure chat 7am-7pm After these hours, please refer to coverage provider listed on [email or web address]

## 2020-07-31 NOTE — Progress Notes (Signed)
PROGRESS NOTE    Lance Ray  NWG:[id number] DOB: March 29, 1971 DOA: 05/07/2020 PCP: Patient, No Pcp Per     Brief Narrative:  Lance Ray a 49 y.o.malewith medical history significant fordementia, agitation and seizure was admitted hospital as a consultation from psychiatry for recurrent seizures.Patient was initally admitted on 6/30 under hospitalist service for generalized tonic clonic seizuredue to non-compliance with Depakote.He was loaded with IV lorazepam and keppra and admitted to PCU.Had negative MRI brain and CT head. Hospital course was complicated by psychosis and agitation and required precedex drip for agitation. Psychiatry wasconsulted and he wasultimatelydischarged to behavior health on 7/16 and wasplaced under involuntary commitmentperiod. While in the behavioral health unit, his sitter apparently heard the patient scream and witnessed patient have a generalized tonic-clonic seizurethat lastedabout 2 minutes was brought back to inpatient unit. Patient does not have capacity to make decisions for himself. He has been evaluated by thepsychiatrist who said patient has no capacity to make decision for himself.Patient is awaiting legal guardianship prior to discharge.   New events last 24 hours / Subjective: No acute events.  Breakfast tray at bedside.  Assessment & Plan:   Principal Problem:   Seizure (HCC) Active Problems:   Psychosis (HCC)   Hypotension   Vitamin D deficiency   Impaired insight   Alcohol abuse   History of recurrent seizures -Likely secondary to noncompliance.  Continue lamotrigine, Keppra and Klonopin.  No further seizures have been noted in the hospital.    History of psychosis with agitation -Was seen by psychiatry during hospitalization.  Continue Haldol, Lamictal Klonopin, olanzapine.    Vitamin B12 and vitamin D deficiency -Continue pyridoxine, thiamine, vitamin B12 and vitamin D.   DVT prophylaxis:  enoxaparin  (LOVENOX) injection 40 mg Start: 05/08/20 0000  Code Status: Full Family Communication: None at bedside Disposition Plan:  Status is: Inpatient  Remains inpatient appropriate because:Unsafe d/c plan   Dispo:  Patient From:   Home  Planned Disposition:   Group home  Expected discharge date: >3 days  Medically stable for discharge:   Yes. Awaiting legal guardianship and placement.     Antimicrobials:  Anti-infectives (From admission, onward)   None       Objective: Vitals:   07/30/20 1528 07/30/20 2008 07/31/20 0423 07/31/20 0757  BP: 109/72 112/79 110/81 134/85  Pulse: 79 90 70 73  Resp: 16 16 15 16   Temp: 97.6 F (36.4 C) 98.4 F (36.9 C) 97.8 F (36.6 C) 97.7 F (36.5 C)  TempSrc:    Oral  SpO2: 97% 96% 97% 97%  Weight:      Height:        Intake/Output Summary (Last 24 hours) at 07/31/2020 1031 Last data filed at 07/30/2020 1856 Gross per 24 hour  Intake 360 ml  Output --  Net 360 ml   Filed Weights   05/29/20 0500 05/30/20 0450 05/31/20 0452  Weight: 66.8 kg 64.3 kg 65.5 kg    Examination: General exam: Appears calm and comfortable   Data Reviewed: I have personally reviewed following labs and imaging studies  CBC: Recent Labs  Lab 07/27/20 0505  WBC 6.1  HGB 13.5  HCT 38.9*  MCV 84.0  PLT 257   Basic Metabolic Panel: Recent Labs  Lab 07/27/20 0505  CREATININE 1.07   GFR: Estimated Creatinine Clearance: 76.5 mL/min (by C-G formula based on SCr of 1.07 mg/dL). Liver Function Tests: No results for input(s): AST, ALT, ALKPHOS, BILITOT, PROT, ALBUMIN in the last 168 hours.  No results for input(s): LIPASE, AMYLASE in the last 168 hours. No results for input(s): AMMONIA in the last 168 hours. Coagulation Profile: No results for input(s): INR, PROTIME in the last 168 hours. Cardiac Enzymes: No results for input(s): CKTOTAL, CKMB, CKMBINDEX, TROPONINI in the last 168 hours. BNP (last 3 results) No results for input(s): PROBNP in the last  8760 hours. HbA1C: No results for input(s): HGBA1C in the last 72 hours. CBG: No results for input(s): GLUCAP in the last 168 hours. Lipid Profile: No results for input(s): CHOL, HDL, LDLCALC, TRIG, CHOLHDL, LDLDIRECT in the last 72 hours. Thyroid Function Tests: No results for input(s): TSH, T4TOTAL, FREET4, T3FREE, THYROIDAB in the last 72 hours. Anemia Panel: No results for input(s): VITAMINB12, FOLATE, FERRITIN, TIBC, IRON, RETICCTPCT in the last 72 hours. Sepsis Labs: No results for input(s): PROCALCITON, LATICACIDVEN in the last 168 hours.  No results found for this or any previous visit (from the past 240 hour(s)).    Radiology Studies: No results found.    Scheduled Meds: . cholecalciferol  1,000 Units Oral Daily  . clonazePAM  0.5 mg Oral BID  . enoxaparin (LOVENOX) injection  40 mg Subcutaneous Q24H  . haloperidol  0.5 mg Oral BID  . lamoTRIgine  100 mg Oral BID   And  . lamoTRIgine  75 mg Oral BID  . levETIRAcetam  1,500 mg Oral BID  . melatonin  5 mg Oral QHS  . OLANZapine  5 mg Oral QHS  . vitamin B-6  50 mg Oral Daily  . thiamine  100 mg Oral Daily  . vitamin B-12  1,000 mcg Oral Daily   Continuous Infusions:   LOS: 85 days      Time spent: 10 minutes   Noralee Stain, DO [facility] 07/31/2020, 10:31 AM   Available via Epic secure chat 7am-7pm After these hours, please refer to coverage provider listed on [email or web address]

## 2020-08-01 NOTE — Progress Notes (Signed)
PROGRESS NOTE    Lance Ray  ZPH:[id number] DOB: 14-Jan-1971 DOA: 05/07/2020 PCP: Patient, No Pcp Per     Brief Narrative:  Lance Picket [name] a 49 y.o.malewith medical history significant fordementia, agitation and seizure was admitted hospital as a consultation from psychiatry for recurrent seizures.Patient was initally admitted on 6/30 under hospitalist service for generalized tonic clonic seizuredue to non-compliance with Depakote.He was loaded with IV lorazepam and keppra and admitted to PCU.Had negative MRI brain and CT head. Hospital course was complicated by psychosis and agitation and required precedex drip for agitation. Psychiatry wasconsulted and he wasultimatelydischarged to behavior health on 7/16 and wasplaced under involuntary commitmentperiod. While in the behavioral health unit, his sitter apparently heard the patient scream and witnessed patient have a generalized tonic-clonic seizurethat lastedabout 2 minutes was brought back to inpatient unit. Patient does not have capacity to make decisions for himself. He has been evaluated by thepsychiatrist who said patient has no capacity to make decision for himself.Patient is awaiting legal guardianship prior to discharge.   New events last 24 hours / Subjective: No complaints on examination today  Assessment & Plan:   Principal Problem:   Seizure (HCC) Active Problems:   Psychosis (HCC)   Hypotension   Vitamin D deficiency   Impaired insight   Alcohol abuse   History of recurrent seizures -Likely secondary to noncompliance.  Continue lamotrigine, Keppra and Klonopin.  No further seizures have been noted in the hospital.    History of psychosis with agitation -Was seen by psychiatry during hospitalization.  Continue Haldol, Lamictal Klonopin, olanzapine.    Vitamin B12 and vitamin D deficiency -Continue pyridoxine, thiamine, vitamin B12 and vitamin D.   DVT prophylaxis:  enoxaparin (LOVENOX)  injection 40 mg Start: 05/08/20 0000  Code Status: Full Family Communication: None at bedside Disposition Plan:  Status is: Inpatient  Remains inpatient appropriate because:Unsafe d/c plan   Dispo:  Patient From:   Home  Planned Disposition:   Group home  Expected discharge date: >3 days  Medically stable for discharge:   Yes. Awaiting legal guardianship and placement.     Antimicrobials:  Anti-infectives (From admission, onward)   None       Objective: Vitals:   07/31/20 1511 07/31/20 2050 07/31/20 2332 08/01/20 0821  BP: 113/78 116/82 98/75 107/75  Pulse: 80 91 80 63  Resp: 16 17 17 20   Temp: 97.9 F (36.6 C) 98.6 F (37 C) 98.2 F (36.8 C) 98.3 F (36.8 C)  TempSrc:  Oral Oral Oral  SpO2: 97% 96% 97% 95%  Weight:      Height:       No intake or output data in the 24 hours ending 08/01/20 1029 Filed Weights   05/29/20 0500 05/30/20 0450 05/31/20 0452  Weight: 66.8 kg 64.3 kg 65.5 kg   Examination: General exam: Appears calm and comfortable    Data Reviewed: I have personally reviewed following labs and imaging studies  CBC: Recent Labs  Lab 07/27/20 0505  WBC 6.1  HGB 13.5  HCT 38.9*  MCV 84.0  PLT 257   Basic Metabolic Panel: Recent Labs  Lab 07/27/20 0505  CREATININE 1.07   GFR: Estimated Creatinine Clearance: 76.5 mL/min (by C-G formula based on SCr of 1.07 mg/dL). Liver Function Tests: No results for input(s): AST, ALT, ALKPHOS, BILITOT, PROT, ALBUMIN in the last 168 hours. No results for input(s): LIPASE, AMYLASE in the last 168 hours. No results for input(s): AMMONIA in the last 168 hours. Coagulation Profile:  No results for input(s): INR, PROTIME in the last 168 hours. Cardiac Enzymes: No results for input(s): CKTOTAL, CKMB, CKMBINDEX, TROPONINI in the last 168 hours. BNP (last 3 results) No results for input(s): PROBNP in the last 8760 hours. HbA1C: No results for input(s): HGBA1C in the last 72 hours. CBG: No results for  input(s): GLUCAP in the last 168 hours. Lipid Profile: No results for input(s): CHOL, HDL, LDLCALC, TRIG, CHOLHDL, LDLDIRECT in the last 72 hours. Thyroid Function Tests: No results for input(s): TSH, T4TOTAL, FREET4, T3FREE, THYROIDAB in the last 72 hours. Anemia Panel: No results for input(s): VITAMINB12, FOLATE, FERRITIN, TIBC, IRON, RETICCTPCT in the last 72 hours. Sepsis Labs: No results for input(s): PROCALCITON, LATICACIDVEN in the last 168 hours.  No results found for this or any previous visit (from the past 240 hour(s)).    Radiology Studies: No results found.    Scheduled Meds: . cholecalciferol  1,000 Units Oral Daily  . clonazePAM  0.5 mg Oral BID  . enoxaparin (LOVENOX) injection  40 mg Subcutaneous Q24H  . haloperidol  0.5 mg Oral BID  . lamoTRIgine  100 mg Oral BID   And  . lamoTRIgine  75 mg Oral BID  . levETIRAcetam  1,500 mg Oral BID  . melatonin  5 mg Oral QHS  . OLANZapine  5 mg Oral QHS  . vitamin B-6  50 mg Oral Daily  . thiamine  100 mg Oral Daily  . vitamin B-12  1,000 mcg Oral Daily   Continuous Infusions:   LOS: 86 days      Time spent: 10 minutes   Noralee Stain, DO [facility] 08/01/2020, 10:29 AM   Available via Epic secure chat 7am-7pm After these hours, please refer to coverage provider listed on [email or web address]

## 2020-08-01 NOTE — Plan of Care (Signed)
  Problem: Health Behavior/Discharge Planning: Goal: Ability to manage health-related needs will improve Outcome: Progressing   

## 2020-08-02 NOTE — TOC Progression Note (Signed)
Transition of Care [facility]) - Progression Note    Patient Details  Name: Lance Ray MRN: [id number] Date of Birth: [birth year]  [facility] of [facility]) CM/SW Contact  Trenton Founds, RN Phone Number: 08/02/2020, 9:47 AM  Clinical Narrative:   RNCM reached out to patient's sister Angie on hospital phone. She reports that this CMs cell phone was coming up as spam and that is why she didn't answer. Angie reports that she still has not gotten paperwork from the clerks office as to her guardianship yet. She reports that she had to go to attorney Langley Adie office Friday to sign paperwork for her to be bonded and now the insurance co that is doing the bonding has to prepare that paperwork and get it back to the clerks office to complete her guardianship. She again re-iterates that until that time she can't proceed with managing his funds and getting the Medicaid processed.  RNCM will continue to follow and facilitate discharge as soon as possible.          Expected Discharge Plan and Services                                                 Social Determinants of Health (SDOH) Interventions    Readmission Risk Interventions Readmission Risk Prevention Plan 03/29/2020  Transportation Screening Complete  HRI or Home Care Consult Complete  Social Work Consult for Recovery Care Planning/Counseling Complete  Palliative Care Screening Not Applicable  Medication Review [employer]) Referral to Pharmacy  Some recent data might be hidden

## 2020-08-02 NOTE — Plan of Care (Signed)
Problem: Activity: Goal: Risk for activity intolerance will decrease Outcome: Progressing   Problem: Education: Goal: Expressions of having a comfortable level of knowledge regarding the disease process will increase Outcome: Progressing   Problem: Coping: Goal: Ability to adjust to condition or change in health will improve Outcome: Progressing Goal: Ability to identify appropriate support needs will improve Outcome: Progressing   Problem: Health Behavior/Discharge Planning: Goal: Compliance with prescribed medication regimen will improve Outcome: Progressing   Problem: Safety: Goal: Verbalization of understanding the information provided will improve Outcome: Progressing   Problem: Self-Concept: Goal: Ability to verbalize feelings about condition will improve Outcome: Progressing   Problem: Education: Goal: Knowledge of General Education information will improve Description: Including pain rating scale, medication(s)/side effects and non-pharmacologic comfort measures Outcome: Progressing   Problem: Health Behavior/Discharge Planning: Goal: Ability to manage health-related needs will improve Outcome: Progressing   Problem: Clinical Measurements: Goal: Ability to maintain clinical measurements within normal limits will improve Outcome: Progressing Goal: Will remain free from infection Outcome: Progressing Goal: Diagnostic test results will improve Outcome: Progressing Goal: Respiratory complications will improve Outcome: Progressing Goal: Cardiovascular complication will be avoided Outcome: Progressing   Problem: Nutrition: Goal: Adequate nutrition will be maintained Outcome: Progressing   Problem: Coping: Goal: Level of anxiety will decrease Outcome: Progressing   Problem: Elimination: Goal: Will not experience complications related to bowel motility Outcome: Progressing Goal: Will not experience complications related to urinary retention Outcome:  Progressing   Problem: Pain Managment: Goal: General experience of comfort will improve Outcome: Progressing   Problem: Safety: Goal: Ability to remain free from injury will improve Outcome: Progressing   Problem: Skin Integrity: Goal: Risk for impaired skin integrity will decrease Outcome: Progressing   Problem: Education: Goal: Knowledge of General Education information will improve Description: Including pain rating scale, medication(s)/side effects and non-pharmacologic comfort measures Outcome: Progressing   Problem: Education: Goal: Expressions of having a comfortable level of knowledge regarding the disease process will increase Outcome: Progressing   Problem: Coping: Goal: Ability to adjust to condition or change in health will improve Outcome: Progressing Goal: Ability to identify appropriate support needs will improve Outcome: Progressing   Problem: Health Behavior/Discharge Planning: Goal: Compliance with prescribed medication regimen will improve Outcome: Progressing   Problem: Medication: Goal: Risk for medication side effects will decrease Outcome: Progressing   Problem: Clinical Measurements: Goal: Complications related to the disease process, condition or treatment will be avoided or minimized Outcome: Progressing Goal: Diagnostic test results will improve Outcome: Progressing   Problem: Safety: Goal: Verbalization of understanding the information provided will improve Outcome: Progressing   Problem: Self-Concept: Goal: Level of anxiety will decrease Outcome: Progressing Goal: Ability to verbalize feelings about condition will improve Outcome: Progressing   Problem: Education: Goal: Knowledge of General Education information will improve Description: Including pain rating scale, medication(s)/side effects and non-pharmacologic comfort measures Outcome: Progressing   Problem: Health Behavior/Discharge Planning: Goal: Ability to manage  health-related needs will improve Outcome: Progressing   Problem: Clinical Measurements: Goal: Ability to maintain clinical measurements within normal limits will improve Outcome: Progressing Goal: Will remain free from infection Outcome: Progressing Goal: Diagnostic test results will improve Outcome: Progressing Goal: Respiratory complications will improve Outcome: Progressing Goal: Cardiovascular complication will be avoided Outcome: Progressing   Problem: Activity: Goal: Risk for activity intolerance will decrease Outcome: Progressing   Problem: Nutrition: Goal: Adequate nutrition will be maintained Outcome: Progressing   Problem: Coping: Goal: Level of anxiety will decrease Outcome: Progressing   Problem: Elimination: Goal: Will not  experience complications related to bowel motility Outcome: Progressing Goal: Will not experience complications related to urinary retention Outcome: Progressing   Problem: Pain Managment: Goal: General experience of comfort will improve Outcome: Progressing   Problem: Safety: Goal: Ability to remain free from injury will improve Outcome: Progressing   Problem: Skin Integrity: Goal: Risk for impaired skin integrity will decrease Outcome: Progressing   Problem: Education: Goal: Expressions of having a comfortable level of knowledge regarding the disease process will increase Outcome: Progressing   Problem: Coping: Goal: Ability to adjust to condition or change in health will improve Outcome: Progressing Goal: Ability to identify appropriate support needs will improve Outcome: Progressing   Problem: Health Behavior/Discharge Planning: Goal: Compliance with prescribed medication regimen will improve Outcome: Progressing   Problem: Medication: Goal: Risk for medication side effects will decrease Outcome: Progressing   Problem: Clinical Measurements: Goal: Complications related to the disease process, condition or treatment  will be avoided or minimized Outcome: Progressing Goal: Diagnostic test results will improve Outcome: Progressing   Problem: Safety: Goal: Verbalization of understanding the information provided will improve Outcome: Progressing   Problem: Self-Concept: Goal: Level of anxiety will decrease Outcome: Progressing Goal: Ability to verbalize feelings about condition will improve Outcome: Progressing

## 2020-08-02 NOTE — Progress Notes (Signed)
PROGRESS NOTE    Lance Ray  KGM:[id number] DOB: 1970-10-16 DOA: 05/07/2020 PCP: Patient, No Pcp Per     Brief Narrative:  Lance Ray medical history significant fordementia, agitation and seizure was admitted hospital as a consultation from psychiatry for recurrent seizures.Patient was initally admitted on 6/30 under hospitalist service for generalized tonic clonic seizuredue to non-compliance with Depakote.He was loaded with IV lorazepam and keppra and admitted to PCU.Had negative MRI brain and CT head. Hospital course was complicated by psychosis and agitation and required precedex drip for agitation. Psychiatry wasconsulted and he wasultimatelydischarged to behavior health on 7/16 and wasplaced under involuntary commitmentperiod. While in the behavioral health unit, his sitter apparently heard the patient scream and witnessed patient have a generalized tonic-clonic seizurethat lastedabout 2 minutes was brought back to inpatient unit. Patient does not have capacity to make decisions for himself. He has been evaluated by thepsychiatrist who said patient has no capacity to make decision for himself.Patient is awaiting legal guardianship prior to discharge.   New events last 24 hours / Subjective: No new issues reported  Assessment & Plan:   Principal Problem:   Seizure (HCC) Active Problems:   Psychosis (HCC)   Hypotension   Vitamin D deficiency   Impaired insight   Alcohol abuse   History of recurrent seizures -Likely secondary to noncompliance.  Continue lamotrigine, Keppra and Klonopin.  No further seizures have been noted in the hospital.    History of psychosis with agitation -Was seen by psychiatry during hospitalization.  Continue Haldol, Lamictal Klonopin, olanzapine.    Vitamin B12 and vitamin D deficiency -Continue pyridoxine, thiamine, vitamin B12 and vitamin D.   DVT prophylaxis:  enoxaparin (LOVENOX) injection 40  mg Start: 05/08/20 0000  Code Status: Full Family Communication: None at bedside Disposition Plan:  Status is: Inpatient  Remains inpatient appropriate because:Unsafe d/c plan   Dispo:  Patient From:   Home  Planned Disposition:   Group home  Expected discharge date: >3 days  Medically stable for discharge:   Yes. Awaiting legal guardianship and placement.     Antimicrobials:  Anti-infectives (From admission, onward)   None       Objective: Vitals:   08/01/20 2018 08/01/20 2341 08/02/20 0437 08/02/20 0723  BP: 116/80 110/78 102/66 105/71  Pulse: 81 74 74 84  Resp: 18 17 16 16   Temp: 98.6 F (37 C) 98.2 F (36.8 C) 98.2 F (36.8 C) 97.8 F (36.6 C)  TempSrc: Oral Oral  Oral  SpO2: 97% 97% 97% 96%  Weight:      Height:        Intake/Output Summary (Last 24 hours) at 08/02/2020 1041 Last data filed at 08/02/2020 1013 Gross per 24 hour  Intake 240 ml  Output --  Net 240 ml   Filed Weights   05/29/20 0500 05/30/20 0450 05/31/20 0452  Weight: 66.8 kg 64.3 kg 65.5 kg   Examination: General exam: Appears calm and comfortable    Data Reviewed: I have personally reviewed following labs and imaging studies  CBC: Recent Labs  Lab 07/27/20 0505  WBC 6.1  HGB 13.5  HCT 38.9*  MCV 84.0  PLT 257   Basic Metabolic Panel: Recent Labs  Lab 07/27/20 0505  CREATININE 1.07   GFR: Estimated Creatinine Clearance: 76.5 mL/min (by C-G formula based on SCr of 1.07 mg/dL). Liver Function Tests: No results for input(s): AST, ALT, ALKPHOS, BILITOT, PROT, ALBUMIN in the last 168 hours. No results for input(s):  LIPASE, AMYLASE in the last 168 hours. No results for input(s): AMMONIA in the last 168 hours. Coagulation Profile: No results for input(s): INR, PROTIME in the last 168 hours. Cardiac Enzymes: No results for input(s): CKTOTAL, CKMB, CKMBINDEX, TROPONINI in the last 168 hours. BNP (last 3 results) No results for input(s): PROBNP in the last 8760  hours. HbA1C: No results for input(s): HGBA1C in the last 72 hours. CBG: No results for input(s): GLUCAP in the last 168 hours. Lipid Profile: No results for input(s): CHOL, HDL, LDLCALC, TRIG, CHOLHDL, LDLDIRECT in the last 72 hours. Thyroid Function Tests: No results for input(s): TSH, T4TOTAL, FREET4, T3FREE, THYROIDAB in the last 72 hours. Anemia Panel: No results for input(s): VITAMINB12, FOLATE, FERRITIN, TIBC, IRON, RETICCTPCT in the last 72 hours. Sepsis Labs: No results for input(s): PROCALCITON, LATICACIDVEN in the last 168 hours.  No results found for this or any previous visit (from the past 240 hour(s)).    Radiology Studies: No results found.    Scheduled Meds: . cholecalciferol  1,000 Units Oral Daily  . clonazePAM  0.5 mg Oral BID  . enoxaparin (LOVENOX) injection  40 mg Subcutaneous Q24H  . haloperidol  0.5 mg Oral BID  . lamoTRIgine  100 mg Oral BID   And  . lamoTRIgine  75 mg Oral BID  . levETIRAcetam  1,500 mg Oral BID  . melatonin  5 mg Oral QHS  . OLANZapine  5 mg Oral QHS  . vitamin B-6  50 mg Oral Daily  . thiamine  100 mg Oral Daily  . vitamin B-12  1,000 mcg Oral Daily   Continuous Infusions:   LOS: 87 days      Time spent: 10 minutes   Noralee Stain, DO [facility] 08/02/2020, 10:41 AM   Available via Epic secure chat 7am-7pm After these hours, please refer to coverage provider listed on [email or web address]

## 2020-08-03 LAB — CREATININE, SERUM
Creatinine, Ser: 1.19 mg/dL (ref 0.61–1.24)
GFR, Estimated: >60 mL/min (ref >60)

## 2020-08-03 LAB — CBC
HCT: 39 % (ref 39.0–52.0)
Hemoglobin: 13.3 g/dL (ref 13.0–17.0)
MCH: 28.9 pg (ref 26.0–34.0)
MCHC: 34.1 g/dL (ref 30.0–36.0)
MCV: 84.6 fL (ref 80.0–100.0)
Platelets: 267 10*3/uL (ref 150–400)
RBC: 4.61 MIL/uL (ref 4.22–5.81)
RDW: 12.9 % (ref 11.5–15.5)
WBC: 6.1 10*3/uL (ref 4.0–10.5)
nRBC: 0 % (ref 0.0–0.2)

## 2020-08-03 NOTE — Plan of Care (Signed)
Patient continues to ambulate around nurses station in the evenings.

## 2020-08-03 NOTE — Progress Notes (Signed)
PROGRESS NOTE    Lance Ray  JSH:[id number] DOB: 05/18/1971 DOA: 05/07/2020 PCP: Patient, No Pcp Per     Brief Narrative:  Lance Picket [name] a 49 y.o.malewith medical history significant fordementia, agitation and seizure was admitted hospital as a consultation from psychiatry for recurrent seizures.Patient was initally admitted on 6/30 under hospitalist service for generalized tonic clonic seizuredue to non-compliance with Depakote.He was loaded with IV lorazepam and keppra and admitted to PCU.Had negative MRI brain and CT head. Hospital course was complicated by psychosis and agitation and required precedex drip for agitation. Psychiatry wasconsulted and he wasultimatelydischarged to behavior health on 7/16 and wasplaced under involuntary commitmentperiod. While in the behavioral health unit, his sitter apparently heard the patient scream and witnessed patient have a generalized tonic-clonic seizurethat lastedabout 2 minutes was brought back to inpatient unit. Patient does not have capacity to make decisions for himself. He has been evaluated by thepsychiatrist who said patient has no capacity to make decision for himself.Patient is awaiting legal guardianship prior to discharge.   New events last 24 hours / Subjective: States that he is tired  Assessment & Plan:   Principal Problem:   Seizure (HCC) Active Problems:   Psychosis (HCC)   Hypotension   Vitamin D deficiency   Impaired insight   Alcohol abuse   History of recurrent seizures -Likely secondary to noncompliance.  Continue lamotrigine, Keppra and Klonopin.  No further seizures have been noted in the hospital.    History of psychosis with agitation -Was seen by psychiatry during hospitalization.  Continue Haldol, Lamictal Klonopin, olanzapine.    Vitamin B12 and vitamin D deficiency -Continue pyridoxine, thiamine, vitamin B12 and vitamin D.   DVT prophylaxis:  enoxaparin (LOVENOX) injection  40 mg Start: 05/08/20 0000  Code Status: Full Family Communication: None at bedside Disposition Plan:  Status is: Inpatient  Remains inpatient appropriate because:Unsafe d/c plan   Dispo:  Patient From:   Home  Planned Disposition: ALF Group home  Expected discharge date: >3 days  Medically stable for discharge:   Yes. Awaiting legal guardianship and placement.     Antimicrobials:  Anti-infectives (From admission, onward)   None       Objective: Vitals:   08/02/20 1948 08/03/20 0011 08/03/20 0445 08/03/20 0758  BP: 121/87 108/79 115/77 111/67  Pulse: 89 73 63 71  Resp: 18 17 17 15   Temp: 98.3 F (36.8 C) 97.9 F (36.6 C) (!) 97.4 F (36.3 C) 97.8 F (36.6 C)  TempSrc: Oral Oral Oral Oral  SpO2: 97% 97% 97% 98%  Weight:      Height:        Intake/Output Summary (Last 24 hours) at 08/03/2020 1003 Last data filed at 08/02/2020 1848 Gross per 24 hour  Intake 240 ml  Output --  Net 240 ml   Filed Weights   05/29/20 0500 05/30/20 0450 05/31/20 0452  Weight: 66.8 kg 64.3 kg 65.5 kg   Examination: General exam: Appears calm and comfortable, disheveled  Data Reviewed: I have personally reviewed following labs and imaging studies  CBC: Recent Labs  Lab 08/03/20 0602  WBC 6.1  HGB 13.3  HCT 39.0  MCV 84.6  PLT 267   Basic Metabolic Panel: Recent Labs  Lab 08/03/20 0602  CREATININE 1.19   GFR: Estimated Creatinine Clearance: 68.8 mL/min (by C-G formula based on SCr of 1.19 mg/dL). Liver Function Tests: No results for input(s): AST, ALT, ALKPHOS, BILITOT, PROT, ALBUMIN in the last 168 hours. No results for input(s):  LIPASE, AMYLASE in the last 168 hours. No results for input(s): AMMONIA in the last 168 hours. Coagulation Profile: No results for input(s): INR, PROTIME in the last 168 hours. Cardiac Enzymes: No results for input(s): CKTOTAL, CKMB, CKMBINDEX, TROPONINI in the last 168 hours. BNP (last 3 results) No results for input(s): PROBNP in the  last 8760 hours. HbA1C: No results for input(s): HGBA1C in the last 72 hours. CBG: No results for input(s): GLUCAP in the last 168 hours. Lipid Profile: No results for input(s): CHOL, HDL, LDLCALC, TRIG, CHOLHDL, LDLDIRECT in the last 72 hours. Thyroid Function Tests: No results for input(s): TSH, T4TOTAL, FREET4, T3FREE, THYROIDAB in the last 72 hours. Anemia Panel: No results for input(s): VITAMINB12, FOLATE, FERRITIN, TIBC, IRON, RETICCTPCT in the last 72 hours. Sepsis Labs: No results for input(s): PROCALCITON, LATICACIDVEN in the last 168 hours.  No results found for this or any previous visit (from the past 240 hour(s)).    Radiology Studies: No results found.    Scheduled Meds: . cholecalciferol  1,000 Units Oral Daily  . clonazePAM  0.5 mg Oral BID  . enoxaparin (LOVENOX) injection  40 mg Subcutaneous Q24H  . haloperidol  0.5 mg Oral BID  . lamoTRIgine  100 mg Oral BID   And  . lamoTRIgine  75 mg Oral BID  . levETIRAcetam  1,500 mg Oral BID  . melatonin  5 mg Oral QHS  . OLANZapine  5 mg Oral QHS  . vitamin B-6  50 mg Oral Daily  . thiamine  100 mg Oral Daily  . vitamin B-12  1,000 mcg Oral Daily   Continuous Infusions:   LOS: 88 days      Time spent: 10 minutes   Lance Stain, DO [facility] 08/03/2020, 10:03 AM   Available via Epic secure chat 7am-7pm After these hours, please refer to coverage provider listed on [email or web address]

## 2020-08-04 DIAGNOSIS — E538 Deficiency of other specified B group vitamins: Secondary | ICD-10-CM

## 2020-08-04 NOTE — Progress Notes (Signed)
Patient ID: Lance Ray, male   DOB: 1971/04/19, 49 y.o.   MRN: [id number] [facility] PROGRESS NOTE  Lance Ray:[id number] DOB: Jul 26, 1971 DOA: 05/07/2020 PCP: Patient, No Pcp Per  HPI/Subjective: Patient feels okay.  A little frustrated that he cannot go to his house.  Physically feels okay.  Right shoulder little painful because he slept on it wrong.  Initially admitted on 05/07/2020 with seizures.  Objective: Vitals:   08/04/20 1148 08/04/20 1519  BP: 98/67 94/61  Pulse: 82 87  Resp: 17 17  Temp: 97.9 F (36.6 C) 98.1 F (36.7 C)  SpO2: 97% 97%    Intake/Output Summary (Last 24 hours) at 08/04/2020 1637 Last data filed at 08/04/2020 1000 Gross per 24 hour  Intake 480 ml  Output 0 ml  Net 480 ml   Filed Weights   05/29/20 0500 05/30/20 0450 05/31/20 0452  Weight: 66.8 kg 64.3 kg 65.5 kg    ROS: Review of Systems  Respiratory: Negative for cough and shortness of breath.   Cardiovascular: Negative for chest pain.  Gastrointestinal: Negative for abdominal pain, nausea and vomiting.  Musculoskeletal: Positive for joint pain.   Exam: Physical Exam HENT:     Head: Normocephalic.     Mouth/Throat:     Pharynx: No oropharyngeal exudate.  Eyes:     General: Lids are normal.     Conjunctiva/sclera: Conjunctivae normal.     Pupils: Pupils are equal, round, and reactive to light.  Cardiovascular:     Rate and Rhythm: Normal rate and regular rhythm.     Heart sounds: Normal heart sounds, S1 normal and S2 normal.  Pulmonary:     Breath sounds: No decreased breath sounds, wheezing, rhonchi or rales.  Abdominal:     Palpations: Abdomen is soft.     Tenderness: There is no abdominal tenderness.  Musculoskeletal:     Right ankle: No swelling.     Left ankle: No swelling.  Skin:    General: Skin is warm.     Findings: No rash.  Neurological:     Mental Status: He is alert.     Comments: Answers all questions appropriately.       Data  Reviewed: Basic Metabolic Panel: Recent Labs  Lab 08/03/20 0602  CREATININE 1.19   CBC: Recent Labs  Lab 08/03/20 0602  WBC 6.1  HGB 13.3  HCT 39.0  MCV 84.6  PLT 267   Scheduled Meds: . cholecalciferol  1,000 Units Oral Daily  . clonazePAM  0.5 mg Oral BID  . enoxaparin (LOVENOX) injection  40 mg Subcutaneous Q24H  . haloperidol  0.5 mg Oral BID  . lamoTRIgine  100 mg Oral BID   And  . lamoTRIgine  75 mg Oral BID  . levETIRAcetam  1,500 mg Oral BID  . melatonin  5 mg Oral QHS  . OLANZapine  5 mg Oral QHS  . vitamin B-6  50 mg Oral Daily  . thiamine  100 mg Oral Daily  . vitamin B-12  1,000 mcg Oral Daily    Assessment/Plan:  1. Seizure disorder.  On lamotrigine, Keppra and Klonopin. 2. 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  Transitional care team still working on things. 3. Vitamin D deficiency on oral vitamin D 4. Alcohol abuse on thiamine 5. B12 deficiency on B12 orally        Code Status:     Code Status Orders  (From admission, onward)  Start     Ordered   05/07/20 2354  Full code  Continuous        05/07/20 2354        Code Status History    Date Active Date Inactive Code Status Order ID Comments User Context   05/07/2020 2323 05/07/2020 2346 Full Code [id number]  Anselm Jungling, DO Inpatient   04/09/2020 1427 05/07/2020 2323 Full Code [id number]  Audery Amel, MD Inpatient   03/24/2020 2314 04/09/2020 1424 Full Code [id number]  Mansy, Vernetta Honey, MD ED   Advance Care Planning Activity     Disposition Plan: Status is: Inpatient  Dispo:  Patient From: Home  Planned Disposition: ALF  Expected discharge date: No plans.  Transitional care team looking into things.  Medically stable for discharge: Yes  Time spent: 24 minutes, case discussed with transitional care team  [employer]  [facility]

## 2020-08-04 NOTE — Plan of Care (Signed)
°  Problem: Activity: °Goal: Risk for activity intolerance will decrease °Outcome: Progressing °  °Problem: Education: °Goal: Knowledge of General Education information will improve °Description: Including pain rating scale, medication(s)/side effects and non-pharmacologic comfort measures °Outcome: Progressing °  °Problem: Health Behavior/Discharge Planning: °Goal: Ability to manage health-related needs will improve °Outcome: Progressing °  °

## 2020-08-04 NOTE — Plan of Care (Signed)
No acute events overnight.  Problem: Activity: Goal: Risk for activity intolerance will decrease Outcome: Progressing   Problem: Education: Goal: Knowledge of General Education information will improve Description: Including pain rating scale, medication(s)/side effects and non-pharmacologic comfort measures Outcome: Progressing   Problem: Health Behavior/Discharge Planning: Goal: Ability to manage health-related needs will improve Outcome: Progressing   Problem: Clinical Measurements: Goal: Ability to maintain clinical measurements within normal limits will improve Outcome: Progressing Goal: Will remain free from infection Outcome: Progressing Goal: Diagnostic test results will improve Outcome: Progressing Goal: Respiratory complications will improve Outcome: Progressing Goal: Cardiovascular complication will be avoided Outcome: Progressing   Problem: Nutrition: Goal: Adequate nutrition will be maintained Outcome: Progressing   Problem: Coping: Goal: Level of anxiety will decrease Outcome: Progressing   Problem: Elimination: Goal: Will not experience complications related to bowel motility Outcome: Progressing Goal: Will not experience complications related to urinary retention Outcome: Progressing   Problem: Pain Managment: Goal: General experience of comfort will improve Outcome: Progressing   Problem: Safety: Goal: Ability to remain free from injury will improve Outcome: Progressing   Problem: Skin Integrity: Goal: Risk for impaired skin integrity will decrease Outcome: Progressing   Problem: Education: Goal: Knowledge of General Education information will improve Description: Including pain rating scale, medication(s)/side effects and non-pharmacologic comfort measures Outcome: Progressing   Problem: Education: Goal: Knowledge of General Education information will improve Description: Including pain rating scale, medication(s)/side effects and  non-pharmacologic comfort measures Outcome: Progressing   Problem: Health Behavior/Discharge Planning: Goal: Ability to manage health-related needs will improve Outcome: Progressing   Problem: Clinical Measurements: Goal: Ability to maintain clinical measurements within normal limits will improve Outcome: Progressing Goal: Will remain free from infection Outcome: Progressing Goal: Diagnostic test results will improve Outcome: Progressing Goal: Respiratory complications will improve Outcome: Progressing Goal: Cardiovascular complication will be avoided Outcome: Progressing   Problem: Activity: Goal: Risk for activity intolerance will decrease Outcome: Progressing   Problem: Nutrition: Goal: Adequate nutrition will be maintained Outcome: Progressing   Problem: Coping: Goal: Level of anxiety will decrease Outcome: Progressing   Problem: Elimination: Goal: Will not experience complications related to bowel motility Outcome: Progressing Goal: Will not experience complications related to urinary retention Outcome: Progressing   Problem: Pain Managment: Goal: General experience of comfort will improve Outcome: Progressing   Problem: Safety: Goal: Ability to remain free from injury will improve Outcome: Progressing   Problem: Skin Integrity: Goal: Risk for impaired skin integrity will decrease Outcome: Progressing   Problem: Education: Goal: Knowledge of General Education information will improve Description: Including pain rating scale, medication(s)/side effects and non-pharmacologic comfort measures Outcome: Progressing   Problem: Health Behavior/Discharge Planning: Goal: Ability to manage health-related needs will improve Outcome: Progressing   Problem: Clinical Measurements: Goal: Ability to maintain clinical measurements within normal limits will improve Outcome: Progressing Goal: Will remain free from infection Outcome: Progressing Goal: Diagnostic test  results will improve Outcome: Progressing Goal: Respiratory complications will improve Outcome: Progressing Goal: Cardiovascular complication will be avoided Outcome: Progressing   Problem: Activity: Goal: Risk for activity intolerance will decrease Outcome: Progressing   Problem: Nutrition: Goal: Adequate nutrition will be maintained Outcome: Progressing   Problem: Coping: Goal: Level of anxiety will decrease Outcome: Progressing   Problem: Elimination: Goal: Will not experience complications related to bowel motility Outcome: Progressing Goal: Will not experience complications related to urinary retention Outcome: Progressing   Problem: Pain Managment: Goal: General experience of comfort will improve Outcome: Progressing   Problem: Safety: Goal: Ability to remain free  from injury will improve Outcome: Progressing   Problem: Skin Integrity: Goal: Risk for impaired skin integrity will decrease Outcome: Progressing

## 2020-08-05 NOTE — Progress Notes (Signed)
Patient ID: METRO EDENFIELD, male   DOB: [birth year], 49 y.o.   MRN: [id number] [facility] PROGRESS NOTE  MICAL KICKLIGHTER GGY:[id number] DOB: [birth year] DOA: 05/07/2020 PCP: Patient, No Pcp Per  HPI/Subjective: Patient seen this morning.  He stated he had a bowel movement while he was sleeping and needed for a shower.  He was eating breakfast and they tell is over his lab.  Nursing staff mentioned that it was just urine and not a bowel movement in the bed.  Right shoulder pain has resolved.  Objective: Vitals:   08/05/20 1157 08/05/20 1534  BP: 92/60 101/67  Pulse: 88 81  Resp: 18 16  Temp: 98.2 F (36.8 C) 98.3 F (36.8 C)  SpO2: 96% (!) 82%    Intake/Output Summary (Last 24 hours) at 08/05/2020 1537 Last data filed at 08/05/2020 1330 Gross per 24 hour  Intake 480 ml  Output 0 ml  Net 480 ml   Filed Weights   05/29/20 0500 05/30/20 0450 05/31/20 0452  Weight: 66.8 kg 64.3 kg 65.5 kg    ROS: Review of Systems  Respiratory: Negative for shortness of breath.   Cardiovascular: Negative for chest pain.  Gastrointestinal: Negative for abdominal pain, nausea and vomiting.   Exam: Physical Exam HENT:     Head: Normocephalic.     Mouth/Throat:     Pharynx: No oropharyngeal exudate.  Eyes:     General: Lids are normal.     Conjunctiva/sclera: Conjunctivae normal.     Pupils: Pupils are equal, round, and reactive to light.  Cardiovascular:     Rate and Rhythm: Normal rate and regular rhythm.     Heart sounds: Normal heart sounds, S1 normal and S2 normal.  Pulmonary:     Breath sounds: No decreased breath sounds, wheezing, rhonchi or rales.  Abdominal:     Palpations: Abdomen is soft.     Tenderness: There is no abdominal tenderness.  Musculoskeletal:     Right lower leg: No swelling.     Left lower leg: No swelling.  Skin:    General: Skin is warm.     Findings: No rash.  Neurological:     Mental Status: He is alert.     Comments: Answers yes or no  questions appropriately.       Data Reviewed: Basic Metabolic Panel: Recent Labs  Lab 08/03/20 0602  CREATININE 1.19  CBC: Recent Labs  Lab 08/03/20 0602  WBC 6.1  HGB 13.3  HCT 39.0  MCV 84.6  PLT 267   Scheduled Meds: . cholecalciferol  1,000 Units Oral Daily  . clonazePAM  0.5 mg Oral BID  . enoxaparin (LOVENOX) injection  40 mg Subcutaneous Q24H  . haloperidol  0.5 mg Oral BID  . lamoTRIgine  100 mg Oral BID   And  . lamoTRIgine  75 mg Oral BID  . levETIRAcetam  1,500 mg Oral BID  . melatonin  5 mg Oral QHS  . OLANZapine  5 mg Oral QHS  . vitamin B-6  50 mg Oral Daily  . thiamine  100 mg Oral Daily  . vitamin B-12  1,000 mcg Oral Daily   Assessment/Plan:   1. Seizure disorder.  Continue lamotrigine, Klonopin and Keppra. 2. 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  Guardianship process still ongoing.  Transitional team still working on things. 3. Vitamin D deficiency.  Continue oral vitamin D. 4. Alcohol abuse.  Continue oral thiamine. 5. B12 deficiency.  Continue  oral B12 6. Last pulse ox in the computer 82%.  Since I was not called on this I am checking with the nurse to see if this is a real value or not.    Code Status:     Code Status Orders  (From admission, onward)         Start     Ordered   05/07/20 2354  Full code  Continuous        05/07/20 2354        Code Status History    Date Active Date Inactive Code Status Order ID Comments User Context   05/07/2020 2323 05/07/2020 2346 Full Code [id number]  Anselm Jungling, DO Inpatient   04/09/2020 1427 05/07/2020 2323 Full Code [id number]  Audery Amel, MD Inpatient   03/24/2020 2314 04/09/2020 1424 Full Code [id number]  Mansy, Vernetta Honey, MD ED   Advance Care Planning Activity     Disposition Plan: Status is: Inpatient  Dispo:  Patient From: Home  Planned Disposition: ALF  Expected discharge date: 08/24/20  Medically stable for discharge:  Yes  Time spent: 20 minutes  [employer]

## 2020-08-05 NOTE — Plan of Care (Signed)
  Problem: Activity: Goal: Risk for activity intolerance will decrease Outcome: Progressing   Problem: Education: Goal: Knowledge of General Education information will improve Description: Including pain rating scale, medication(s)/side effects and non-pharmacologic comfort measures Outcome: Progressing   Problem: Elimination: Goal: Will not experience complications related to bowel motility Outcome: Progressing Goal: Will not experience complications related to urinary retention Outcome: Progressing   Problem: Pain Managment: Goal: General experience of comfort will improve Outcome: Progressing

## 2020-08-06 NOTE — Plan of Care (Signed)
Problem: Activity: Goal: Risk for activity intolerance will decrease Outcome: Progressing   Problem: Education: Goal: Knowledge of General Education information will improve Description: Including pain rating scale, medication(s)/side effects and non-pharmacologic comfort measures Outcome: Progressing   Problem: Health Behavior/Discharge Planning: Goal: Ability to manage health-related needs will improve Outcome: Progressing   Problem: Clinical Measurements: Goal: Ability to maintain clinical measurements within normal limits will improve Outcome: Progressing Goal: Will remain free from infection Outcome: Progressing Goal: Diagnostic test results will improve Outcome: Progressing Goal: Respiratory complications will improve Outcome: Progressing Goal: Cardiovascular complication will be avoided Outcome: Progressing   Problem: Nutrition: Goal: Adequate nutrition will be maintained Outcome: Progressing   Problem: Coping: Goal: Level of anxiety will decrease Outcome: Progressing   Problem: Elimination: Goal: Will not experience complications related to bowel motility Outcome: Progressing Goal: Will not experience complications related to urinary retention Outcome: Progressing   Problem: Pain Managment: Goal: General experience of comfort will improve Outcome: Progressing   Problem: Safety: Goal: Ability to remain free from injury will improve Outcome: Progressing   Problem: Skin Integrity: Goal: Risk for impaired skin integrity will decrease Outcome: Progressing   Problem: Education: Goal: Knowledge of General Education information will improve Description: Including pain rating scale, medication(s)/side effects and non-pharmacologic comfort measures Outcome: Progressing   Problem: Education: Goal: Knowledge of General Education information will improve Description: Including pain rating scale, medication(s)/side effects and non-pharmacologic comfort  measures Outcome: Progressing   Problem: Health Behavior/Discharge Planning: Goal: Ability to manage health-related needs will improve Outcome: Progressing   Problem: Clinical Measurements: Goal: Ability to maintain clinical measurements within normal limits will improve Outcome: Progressing Goal: Will remain free from infection Outcome: Progressing Goal: Diagnostic test results will improve Outcome: Progressing Goal: Respiratory complications will improve Outcome: Progressing Goal: Cardiovascular complication will be avoided Outcome: Progressing   Problem: Activity: Goal: Risk for activity intolerance will decrease Outcome: Progressing   Problem: Nutrition: Goal: Adequate nutrition will be maintained Outcome: Progressing   Problem: Coping: Goal: Level of anxiety will decrease Outcome: Progressing   Problem: Elimination: Goal: Will not experience complications related to bowel motility Outcome: Progressing Goal: Will not experience complications related to urinary retention Outcome: Progressing   Problem: Pain Managment: Goal: General experience of comfort will improve Outcome: Progressing   Problem: Safety: Goal: Ability to remain free from injury will improve Outcome: Progressing   Problem: Skin Integrity: Goal: Risk for impaired skin integrity will decrease Outcome: Progressing   Problem: Education: Goal: Knowledge of General Education information will improve Description: Including pain rating scale, medication(s)/side effects and non-pharmacologic comfort measures Outcome: Progressing   Problem: Health Behavior/Discharge Planning: Goal: Ability to manage health-related needs will improve Outcome: Progressing   Problem: Clinical Measurements: Goal: Ability to maintain clinical measurements within normal limits will improve Outcome: Progressing Goal: Will remain free from infection Outcome: Progressing Goal: Diagnostic test results will  improve Outcome: Progressing Goal: Respiratory complications will improve Outcome: Progressing Goal: Cardiovascular complication will be avoided Outcome: Progressing   Problem: Activity: Goal: Risk for activity intolerance will decrease Outcome: Progressing   Problem: Nutrition: Goal: Adequate nutrition will be maintained Outcome: Progressing   Problem: Coping: Goal: Level of anxiety will decrease Outcome: Progressing   Problem: Elimination: Goal: Will not experience complications related to bowel motility Outcome: Progressing Goal: Will not experience complications related to urinary retention Outcome: Progressing   Problem: Pain Managment: Goal: General experience of comfort will improve Outcome: Progressing   Problem: Safety: Goal: Ability to remain free from injury will improve Outcome:  Progressing   Problem: Skin Integrity: Goal: Risk for impaired skin integrity will decrease Outcome: Progressing

## 2020-08-06 NOTE — Progress Notes (Signed)
Patient ID: Lance Ray, male   DOB: [birth year], 49 y.o.   MRN: [id number] [facility] PROGRESS NOTE  Lance Ray:[id number] DOB: 1970/12/22 DOA: 05/07/2020 PCP: Patient, No Pcp Per  HPI/Subjective: Patient seen this morning and he was sleeping.  He awakened and answered some questions.  Felt a little tired today.  Offers no other complaints.  Objective: Vitals:   08/06/20 0806 08/06/20 1158  BP: 117/81 112/73  Pulse: 64 76  Resp: 15 15  Temp: 98.2 F (36.8 C) 98 F (36.7 C)  SpO2: 97% 96%    Filed Weights   05/29/20 0500 05/30/20 0450 05/31/20 0452  Weight: 66.8 kg 64.3 kg 65.5 kg    ROS: Review of Systems  Respiratory: Negative for shortness of breath.   Cardiovascular: Negative for chest pain.  Gastrointestinal: Negative for abdominal pain, nausea and vomiting.   Exam: Physical Exam HENT:     Head: Normocephalic.     Mouth/Throat:     Pharynx: No oropharyngeal exudate.  Eyes:     General: Lids are normal.     Conjunctiva/sclera: Conjunctivae normal.     Pupils: Pupils are equal, round, and reactive to light.  Cardiovascular:     Rate and Rhythm: Normal rate and regular rhythm.     Heart sounds: Normal heart sounds, S1 normal and S2 normal.  Pulmonary:     Breath sounds: No decreased breath sounds, wheezing, rhonchi or rales.  Abdominal:     Palpations: Abdomen is soft.     Tenderness: There is no abdominal tenderness.  Musculoskeletal:     Right lower leg: No swelling.     Left lower leg: No swelling.  Skin:    General: Skin is warm.     Findings: No rash.  Neurological:     Mental Status: He is alert.     Comments: Answers yes/no questions appropriately.       Data Reviewed: Basic Metabolic Panel: Recent Labs  Lab 08/03/20 0602  CREATININE 1.19   CBC: Recent Labs  Lab 08/03/20 0602  WBC 6.1  HGB 13.3  HCT 39.0  MCV 84.6  PLT 267    Scheduled Meds: . cholecalciferol  1,000 Units Oral Daily  . clonazePAM  0.5 mg  Oral BID  . enoxaparin (LOVENOX) injection  40 mg Subcutaneous Q24H  . haloperidol  0.5 mg Oral BID  . lamoTRIgine  100 mg Oral BID   And  . lamoTRIgine  75 mg Oral BID  . levETIRAcetam  1,500 mg Oral BID  . melatonin  5 mg Oral QHS  . OLANZapine  5 mg Oral QHS  . vitamin B-6  50 mg Oral Daily  . thiamine  100 mg Oral Daily  . vitamin B-12  1,000 mcg Oral Daily    Assessment/Plan:  1. Seizure disorder.  On Klonopin, lamotrigine and Keppra. 2. 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  Guardianship process still proceeding.  Transitional team working on things after hospitalization. 3. Vitamin D deficiency.  On oral vitamin D 4. Alcohol abuse.  On oral thiamine. 5. B12 deficiency.  On oral B12 supplementation    Code Status:     Code Status Orders  (From admission, onward)         Start     Ordered   05/07/20 2354  Full code  Continuous        05/07/20 2354        Code Status History  Date Active Date Inactive Code Status Order ID Comments User Context   05/07/2020 2323 05/07/2020 2346 Full Code [id number]  Anselm Jungling, DO Inpatient   04/09/2020 1427 05/07/2020 2323 Full Code [id number]  Audery Amel, MD Inpatient   03/24/2020 2314 04/09/2020 1424 Full Code [id number]  Mansy, Vernetta Honey, MD ED   Advance Care Planning Activity     Disposition Plan: Status is: Inpatient  Dispo:  Patient From: Home  Planned Disposition: ALF  Expected discharge date: No plans at this point.  Medically stable for discharge: Yes  Time spent: 24 minutes  [employer]

## 2020-08-07 NOTE — Progress Notes (Signed)
Patient ID: Lance Ray, male   DOB: July 06, 1971, 49 y.o.   MRN: [id number] [facility] PROGRESS NOTE  Lance Ray:[id number] DOB: 1970-11-15 DOA: 05/07/2020 PCP: Patient, No Pcp Per  HPI/Subjective: Patient seen lying in bed.  Offers no physical complaints.  Frustrated that he has to be here in the hospital.  Admitted from psychiatry floor to medical floor on 05/07/2020 with seizure.  Objective: Vitals:   08/07/20 0801 08/07/20 1132  BP: 117/83 101/67  Pulse: 62 71  Resp: 16 16  Temp: 98.1 F (36.7 C) 98.4 F (36.9 C)  SpO2: 96% 97%    Intake/Output Summary (Last 24 hours) at 08/07/2020 1235 Last data filed at 08/07/2020 1031 Gross per 24 hour  Intake 240 ml  Output --  Net 240 ml   Filed Weights   05/29/20 0500 05/30/20 0450 05/31/20 0452  Weight: 66.8 kg 64.3 kg 65.5 kg    ROS: Review of Systems  Respiratory: Negative for shortness of breath.   Cardiovascular: Negative for chest pain.  Gastrointestinal: Negative for abdominal pain, nausea and vomiting.   Exam: Physical Exam HENT:     Head: Normocephalic.     Mouth/Throat:     Pharynx: No oropharyngeal exudate.  Eyes:     General: Lids are normal.     Conjunctiva/sclera: Conjunctivae normal.     Pupils: Pupils are equal, round, and reactive to light.  Cardiovascular:     Rate and Rhythm: Normal rate and regular rhythm.     Heart sounds: Normal heart sounds, S1 normal and S2 normal.  Pulmonary:     Breath sounds: No decreased breath sounds, wheezing, rhonchi or rales.  Abdominal:     Palpations: Abdomen is soft.     Tenderness: There is no abdominal tenderness.  Musculoskeletal:     Right ankle: No swelling.     Left ankle: No swelling.  Skin:    General: Skin is warm.     Findings: No rash.  Neurological:     Mental Status: He is alert.     Comments: Patient's answers all yes or no questions appropriately.       Data Reviewed: Basic Metabolic Panel: Recent Labs  Lab  08/03/20 0602  CREATININE 1.19   CBC: Recent Labs  Lab 08/03/20 0602  WBC 6.1  HGB 13.3  HCT 39.0  MCV 84.6  PLT 267    Scheduled Meds: . cholecalciferol  1,000 Units Oral Daily  . clonazePAM  0.5 mg Oral BID  . enoxaparin (LOVENOX) injection  40 mg Subcutaneous Q24H  . haloperidol  0.5 mg Oral BID  . lamoTRIgine  100 mg Oral BID   And  . lamoTRIgine  75 mg Oral BID  . levETIRAcetam  1,500 mg Oral BID  . melatonin  5 mg Oral QHS  . OLANZapine  5 mg Oral QHS  . vitamin B-6  50 mg Oral Daily  . thiamine  100 mg Oral Daily  . vitamin B-12  1,000 mcg Oral Daily    Assessment/Plan:  1. Seizure disorder.  Continue Keppra, lamotrigine and Klonopin. 2. Impaired insight.  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  Guardianship process still undergoing.  Transitional care team waiting for guardianship process to finish prior to setting up a plan after hospitalization. 3. Vitamin D deficiency.  Continue oral vitamin D 4. Alcohol abuse.  Continue oral thiamine 5. B12 deficiency.  Continue oral B12 supplementation   Code Status:     Code Status  Orders  (From admission, onward)         Start     Ordered   05/07/20 2354  Full code  Continuous        05/07/20 2354        Code Status History    Date Active Date Inactive Code Status Order ID Comments User Context   05/07/2020 2323 05/07/2020 2346 Full Code [id number]  Anselm Jungling, DO Inpatient   04/09/2020 1427 05/07/2020 2323 Full Code [id number]  Audery Amel, MD Inpatient   03/24/2020 2314 04/09/2020 1424 Full Code [id number]  Mansy, Vernetta Honey, MD ED   Advance Care Planning Activity     Disposition Plan: Status is: Inpatient  Dispo:  Patient From: Home  Planned Disposition: ALF  Expected discharge date: 08/24/20  Medically stable for discharge: Yes  Time spent: 23 minutes  [employer] [employer]

## 2020-08-08 NOTE — Progress Notes (Signed)
Patient ID: HASHEM GOYNES, male   DOB: [birth year], 49 y.o.   MRN: [id number] [facility] PROGRESS NOTE  TEON HUDNALL DXA:[id number] DOB: 09-21-1971 DOA: 05/07/2020 PCP: Patient, No Pcp Per  HPI/Subjective: Patient feeling okay.  Feels a little tired.  Slept well last night.  Offers no physical complaints.  Admitted from the psychiatry floor on 8/13 secondary to breakthrough seizure.  Objective: Vitals:   08/08/20 0817 08/08/20 1232  BP: 114/77 101/77  Pulse: 61 74  Resp: 16 17  Temp: 98.1 F (36.7 C) 98.3 F (36.8 C)  SpO2: 98% 97%    Intake/Output Summary (Last 24 hours) at 08/08/2020 1237 Last data filed at 08/07/2020 1956 Gross per 24 hour  Intake 240 ml  Output 1 ml  Net 239 ml   Filed Weights   05/29/20 0500 05/30/20 0450 05/31/20 0452  Weight: 66.8 kg 64.3 kg 65.5 kg    ROS: Review of Systems  Respiratory: Negative for shortness of breath.   Cardiovascular: Negative for chest pain.  Gastrointestinal: Negative for abdominal pain, diarrhea, nausea and vomiting.   Exam: Physical Exam HENT:     Head: Normocephalic.     Mouth/Throat:     Pharynx: No oropharyngeal exudate.  Eyes:     General: Lids are normal.     Conjunctiva/sclera: Conjunctivae normal.     Pupils: Pupils are equal, round, and reactive to light.  Cardiovascular:     Rate and Rhythm: Normal rate and regular rhythm.     Heart sounds: Normal heart sounds, S1 normal and S2 normal.  Pulmonary:     Breath sounds: No decreased breath sounds, wheezing, rhonchi or rales.  Abdominal:     Palpations: Abdomen is soft.     Tenderness: There is no abdominal tenderness.  Musculoskeletal:     Right lower leg: No swelling.     Left lower leg: No swelling.  Skin:    General: Skin is warm.     Findings: No lesion.  Neurological:     Mental Status: He is alert.     Comments: Answers some simple yes or no questions       Data Reviewed: Basic Metabolic Panel: Recent Labs  Lab  08/03/20 0602  CREATININE 1.19   CBC: Recent Labs  Lab 08/03/20 0602  WBC 6.1  HGB 13.3  HCT 39.0  MCV 84.6  PLT 267    Scheduled Meds: . cholecalciferol  1,000 Units Oral Daily  . clonazePAM  0.5 mg Oral BID  . enoxaparin (LOVENOX) injection  40 mg Subcutaneous Q24H  . haloperidol  0.5 mg Oral BID  . lamoTRIgine  100 mg Oral BID   And  . lamoTRIgine  75 mg Oral BID  . levETIRAcetam  1,500 mg Oral BID  . melatonin  5 mg Oral QHS  . OLANZapine  5 mg Oral QHS  . vitamin B-6  50 mg Oral Daily  . thiamine  100 mg Oral Daily  . vitamin B-12  1,000 mcg Oral Daily    Assessment/Plan:  1. Seizure disorder.  No further seizures since being back on the medical floor.  On Klonopin, lamotrigine and Keppra. 2. Impaired insight.  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  Guardianship process still undergoing. 3. Vitamin D deficiency.  On oral vitamin D 4. Alcohol abuse.  Continue oral thiamine 5. B12 deficiency.  On oral B12 supplementation      Code Status:     Code Status Orders  (From admission,  onward)         Start     Ordered   05/07/20 2354  Full code  Continuous        05/07/20 2354        Code Status History    Date Active Date Inactive Code Status Order ID Comments User Context   05/07/2020 2323 05/07/2020 2346 Full Code [id number]  Anselm Jungling, DO Inpatient   04/09/2020 1427 05/07/2020 2323 Full Code [id number]  Audery Amel, MD Inpatient   03/24/2020 2314 04/09/2020 1424 Full Code [id number]  Mansy, Vernetta Honey, MD ED   Advance Care Planning Activity     Disposition Plan: Status is: Inpatient  Dispo:  Patient From: Home  Planned Disposition: ALF  Expected discharge date: Awaiting guardianship process to be completed prior to disposition  Medically stable for discharge: Yes  Time spent: 22 minutes  Nieshia [employer]

## 2020-08-09 NOTE — Progress Notes (Signed)
Patient ID: Lance Ray, male   DOB: 11/29/1970, 49 y.o.   MRN: [id number] [facility] PROGRESS NOTE  OZZIE KNOBEL VEL:[id number] DOB: Feb 14, 1971 DOA: 05/07/2020 PCP: Patient, No Pcp Per  HPI/Subjective: Patient's nurse was in the room when I went in there.  Next she was ready to give morning medications.  Patient stated he felt little tired.  He was lying in the bed.  Patient states he feels okay otherwise.  Some pain in his right shoulder but able to move it okay.  Objective: Vitals:   08/09/20 0747 08/09/20 1140  BP: 113/79 111/82  Pulse: 63 86  Resp: 18 18  Temp: 98.2 F (36.8 C) 98.3 F (36.8 C)  SpO2: 97% 96%    Intake/Output Summary (Last 24 hours) at 08/09/2020 1505 Last data filed at 08/08/2020 1839 Gross per 24 hour  Intake 120 ml  Output --  Net 120 ml   Filed Weights   05/29/20 0500 05/30/20 0450 05/31/20 0452  Weight: 66.8 kg 64.3 kg 65.5 kg    ROS: Review of Systems  Respiratory: Negative for shortness of breath.   Cardiovascular: Negative for chest pain.  Gastrointestinal: Negative for abdominal pain, nausea and vomiting.  Musculoskeletal: Positive for joint pain.   Exam: Physical Exam HENT:     Head: Normocephalic.     Mouth/Throat:     Pharynx: No oropharyngeal exudate.  Eyes:     General: Lids are normal.     Conjunctiva/sclera: Conjunctivae normal.     Pupils: Pupils are equal, round, and reactive to light.  Cardiovascular:     Rate and Rhythm: Normal rate and regular rhythm.     Heart sounds: Normal heart sounds, S1 normal and S2 normal.  Pulmonary:     Breath sounds: No decreased breath sounds, wheezing, rhonchi or rales.  Abdominal:     Palpations: Abdomen is soft.     Tenderness: There is no abdominal tenderness.  Musculoskeletal:     Right ankle: No swelling.     Left ankle: No swelling.  Skin:    General: Skin is warm.     Findings: No rash.  Neurological:     Mental Status: He is alert.     Comments: Answers all  yes or no questions appropriately.       Data Reviewed: Basic Metabolic Panel: Recent Labs  Lab 08/03/20 0602  CREATININE 1.19   CBC: Recent Labs  Lab 08/03/20 0602  WBC 6.1  HGB 13.3  HCT 39.0  MCV 84.6  PLT 267    Scheduled Meds: . cholecalciferol  1,000 Units Oral Daily  . clonazePAM  0.5 mg Oral BID  . enoxaparin (LOVENOX) injection  40 mg Subcutaneous Q24H  . haloperidol  0.5 mg Oral BID  . lamoTRIgine  100 mg Oral BID   And  . lamoTRIgine  75 mg Oral BID  . levETIRAcetam  1,500 mg Oral BID  . melatonin  5 mg Oral QHS  . OLANZapine  5 mg Oral QHS  . vitamin B-6  50 mg Oral Daily  . thiamine  100 mg Oral Daily  . vitamin B-12  1,000 mcg Oral Daily    Assessment/Plan:  1. Seizure disorder.  Continue Klonopin, Keppra and lamotrigine. 2. Impaired insight.  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  Guardianship process still undergoing. 3. Vitamin D deficiency.  Continue oral vitamin D 4. Alcohol abuse.  Continue oral thiamine. 5. B12 deficiency.  Continue oral B12 supplementation  Code Status:     Code Status Orders  (From admission, onward)         Start     Ordered   05/07/20 2354  Full code  Continuous        05/07/20 2354        Code Status History    Date Active Date Inactive Code Status Order ID Comments User Context   05/07/2020 2323 05/07/2020 2346 Full Code [id number]  Anselm Jungling, DO Inpatient   04/09/2020 1427 05/07/2020 2323 Full Code [id number]  Audery Amel, MD Inpatient   03/24/2020 2314 04/09/2020 1424 Full Code [id number]  Mansy, Vernetta Honey, MD ED   Advance Care Planning Activity     Disposition Plan: Status is: Inpatient  Dispo:  Patient From: Home  Planned Disposition: ALF  Expected discharge date: As per transitional care team no plans at this point time.  Medically stable for discharge: Yes  Time spent: 20 minutes  Haila Dena [employer]

## 2020-08-09 NOTE — Plan of Care (Signed)
  Problem: Activity: Goal: Risk for activity intolerance will decrease Outcome: Progressing   Problem: Education: Goal: Knowledge of General Education information will improve Description: Including pain rating scale, medication(s)/side effects and non-pharmacologic comfort measures Outcome: Progressing   Problem: Health Behavior/Discharge Planning: Goal: Ability to manage health-related needs will improve Outcome: Progressing   Problem: Clinical Measurements: Goal: Ability to maintain clinical measurements within normal limits will improve Outcome: Progressing Goal: Will remain free from infection Outcome: Progressing Goal: Diagnostic test results will improve Outcome: Progressing Goal: Respiratory complications will improve Outcome: Progressing Goal: Cardiovascular complication will be avoided Outcome: Progressing

## 2020-08-10 ENCOUNTER — Inpatient Hospital Stay: Payer: Medicaid Other

## 2020-08-10 LAB — CBC
HCT: 40.3 % (ref 39.0–52.0)
Hemoglobin: 13.9 g/dL (ref 13.0–17.0)
MCH: 28.7 pg (ref 26.0–34.0)
MCHC: 34.5 g/dL (ref 30.0–36.0)
MCV: 83.1 fL (ref 80.0–100.0)
Platelets: 267 10*3/uL (ref 150–400)
RBC: 4.85 MIL/uL (ref 4.22–5.81)
RDW: 12.9 % (ref 11.5–15.5)
WBC: 6.8 10*3/uL (ref 4.0–10.5)
nRBC: 0 % (ref 0.0–0.2)

## 2020-08-10 LAB — RESPIRATORY PANEL BY RT PCR (FLU A&B, COVID)
Influenza A by PCR: NEGATIVE
Influenza B by PCR: NEGATIVE
SARS Coronavirus 2 by RT PCR: NEGATIVE

## 2020-08-10 LAB — CREATININE, SERUM
Creatinine, Ser: 1.06 mg/dL (ref 0.61–1.24)
GFR, Estimated: >60 mL/min (ref >60)

## 2020-08-10 MED ORDER — THIAMINE HCL 100 MG PO TABS: 100.0000 mg | ORAL_TABLET | Freq: Every day | ORAL | 0 refills | Status: AC

## 2020-08-10 MED ORDER — PYRIDOXINE HCL 50 MG PO TABS: 50.0000 mg | ORAL_TABLET | Freq: Every day | ORAL | 0 refills | Status: AC

## 2020-08-10 MED ORDER — LEVETIRACETAM ER 1500 MG PO TB24: 1500.0000 mg | ORAL_TABLET | Freq: Two times a day (BID) | ORAL | 0 refills | Status: AC

## 2020-08-10 MED ORDER — HALOPERIDOL 0.5 MG PO TABS: 0.5000 mg | ORAL_TABLET | Freq: Two times a day (BID) | ORAL | 0 refills | Status: AC

## 2020-08-10 MED ORDER — CLONAZEPAM 0.5 MG PO TABS: 0.5000 mg | ORAL_TABLET | Freq: Two times a day (BID) | ORAL | 0 refills | Status: AC

## 2020-08-10 MED ORDER — OLANZAPINE 5 MG PO TABS: 5.0000 mg | ORAL_TABLET | Freq: Every day | ORAL | 0 refills | Status: AC

## 2020-08-10 MED ORDER — MELATONIN 5 MG PO TABS: 5.0000 mg | ORAL_TABLET | Freq: Every day | ORAL | 0 refills | Status: AC

## 2020-08-10 MED ORDER — LAMOTRIGINE 25 MG PO TABS: 75.0000 mg | ORAL_TABLET | Freq: Two times a day (BID) | ORAL | 0 refills | Status: AC

## 2020-08-10 MED ORDER — VITAMIN D3 25 MCG PO TABS: 1000.0000 [IU] | ORAL_TABLET | Freq: Every day | ORAL | 0 refills | Status: AC

## 2020-08-10 MED ORDER — LAMOTRIGINE 100 MG PO TABS: 100.0000 mg | ORAL_TABLET | Freq: Two times a day (BID) | ORAL | 0 refills | Status: AC

## 2020-08-10 MED ORDER — CYANOCOBALAMIN 1000 MCG PO TABS: 1000.0000 ug | ORAL_TABLET | Freq: Every day | ORAL | 0 refills | Status: AC

## 2020-08-10 NOTE — Discharge Summary (Addendum)
[facility] - [place] [facility]   PATIENT NAME: Lance Ray    MR#:  [id number]  DATE OF BIRTH:  30-Dec-1970  DATE OF ADMISSION:  05/07/2020 ADMITTING PHYSICIAN: Anselm Jungling, DO  DATE OF DISCHARGE: 08/11/2020  PRIMARY CARE PHYSICIAN: Patient, No Pcp Per    ADMISSION DIAGNOSIS:  Seizure (HCC) [R56.9]  DISCHARGE DIAGNOSIS:  Principal Problem:   Seizure (HCC) Active Problems:   Psychosis (HCC)   Hypotension   Vitamin D deficiency   Impaired insight   Alcohol abuse   B12 deficiency   SECONDARY DIAGNOSIS:   Past Medical History:  Diagnosis Date  . Seizures (HCC)     HOSPITAL COURSE:   1.  Seizure disorder.  Patient was transferred from the psychiatry unit up to the medical unit for breakthrough seizure 95 days ago.  The patient has been stable on his lamotrigine, Klonopin and Keppra since.  Lamotrigine is 175 mg twice a day.  Keppra is 1500 mg twice a day.  Klonopin is 0.5 mg twice a day. 2.  Impaired insight.  Psychiatric consultation team the patient with chronic impairment of judgment and insight.  Guardianship process still undergoing.  Transitional care team able to get placement for the patient. 3.  Episode of psychosis during the hospital course.  Continue Haldol and Zyprexa.  Mental status has been good the past week that I have seen him in the prior week that I saw him previously. 4.  Alcohol abuse.  Continue oral thiamine 5.  B12 deficiency continue oral B12 supplementation 6.  Assisted living facility wanted a chest x-ray to rule out TB.  Last chest x-ray back in August did not show any signs of tuberculosis.  Ordered a repeat chest x-ray for today.  We will also repeat a Covid test.  DISCHARGE CONDITIONS:   Satisfactory  CONSULTS OBTAINED:  Psychiatry and neurology have seen the patient during the long hospital course.  DRUG ALLERGIES:  No Known Allergies  DISCHARGE MEDICATIONS:   Allergies as of 08/10/2020   No Known Allergies      Medication List    TAKE these medications   clonazePAM 0.5 MG tablet Commonly known as: KLONOPIN Take 1 tablet (0.5 mg total) by mouth 2 (two) times daily.   cyanocobalamin 1000 MCG tablet Take 1 tablet (1,000 mcg total) by mouth daily. Start taking on: August 11, 2020   haloperidol 0.5 MG tablet Commonly known as: HALDOL Take 1 tablet (0.5 mg total) by mouth 2 (two) times daily.   lamoTRIgine 100 MG tablet Commonly known as: LAMICTAL Take 1 tablet (100 mg total) by mouth 2 (two) times daily.   lamoTRIgine 25 MG tablet Commonly known as: LAMICTAL Take 3 tablets (75 mg total) by mouth 2 (two) times daily.   levETIRAcetam ER 1500 MG Tb24 Take 1,500 mg by mouth 2 (two) times daily.   melatonin 5 MG Tabs Take 1 tablet (5 mg total) by mouth at bedtime.   OLANZapine 5 MG tablet Commonly known as: ZYPREXA Take 1 tablet (5 mg total) by mouth at bedtime.   pyridOXINE 50 MG tablet Commonly known as: B-6 Take 1 tablet (50 mg total) by mouth daily. Start taking on: August 11, 2020   thiamine 100 MG tablet Take 1 tablet (100 mg total) by mouth daily. Start taking on: August 11, 2020   Vitamin D3 25 MCG tablet Commonly known as: Vitamin D Take 1 tablet (1,000 Units total) by mouth daily. Start taking on: August 11, 2020  DISCHARGE INSTRUCTIONS:    If you experience worsening of your admission symptoms, develop shortness of breath, life threatening emergency, suicidal or homicidal thoughts you must seek medical attention immediately by calling 911 or calling your MD immediately  if symptoms less severe.  You Must read complete instructions/literature along with all the possible adverse reactions/side effects for all the Medicines you take and that have been prescribed to you. Take any new Medicines after you have completely understood and accept all the possible adverse reactions/side effects.   Please note  You were cared for by a hospitalist during  your hospital stay. If you have any questions about your discharge medications or the care you received while you were in the hospital after you are discharged, you can call the unit and asked to speak with the hospitalist on call if the hospitalist that took care of you is not available. Once you are discharged, your primary care physician will handle any further medical issues. Please note that NO REFILLS for any discharge medications will be authorized once you are discharged, as it is imperative that you return to your primary care physician (or establish a relationship with a primary care physician if you do not have one) for your aftercare needs so that they can reassess your need for medications and monitor your lab values.    Today   CHIEF COMPLAINT:  Seizures  HISTORY OF PRESENT ILLNESS:  Lance Ray  is a 49 y.o. male readmitted to the medical floor from psychiatry floor secondary to seizures.   VITAL SIGNS:  Blood pressure 117/85, pulse 77, temperature (!) 97.3 F (36.3 C), temperature source Oral, resp. rate 16, height 5\' 9"  (1.753 m), weight 65.5 kg, SpO2 97 %.  I/O:    Intake/Output Summary (Last 24 hours) at 08/10/2020 1450 Last data filed at 08/09/2020 1931 Gross per 24 hour  Intake --  Output 0 ml  Net 0 ml    DATA REVIEW:   CBC Recent Labs  Lab 08/10/20 0451  WBC 6.8  HGB 13.9  HCT 40.3  PLT 267    Chemistries  Recent Labs  Lab 08/10/20 0451  CREATININE 1.06    Microbiology Results  Results for orders placed or performed during the hospital encounter of 05/07/20  Culture, blood (single) w Reflex to ID Panel     Status: None   Collection Time: 05/21/20  5:06 PM   Specimen: BLOOD  Result Value Ref Range Status   Specimen Description BLOOD RIGHT [facility]  Final   Special Requests   Final    BOTTLES DRAWN AEROBIC AND ANAEROBIC Blood Culture adequate volume   Culture   Final    NO GROWTH 5 DAYS Performed at [facility], [address], [place], [place]    Report Status 05/26/2020 FINAL  Final     Management plans discussed with the patient, family and and he is in agreement.  CODE STATUS:     Code Status Orders  (From admission, onward)         Start     Ordered   05/07/20 2354  Full code  Continuous        05/07/20 2354        Code Status History    Date Active Date Inactive Code Status Order ID Comments User Context   05/07/2020 2323 05/07/2020 2346 Full Code 05/09/2020  [id number], DO Inpatient   04/09/2020 1427 05/07/2020 2323 Full Code 05/09/2020  [name], [id number], MD  Inpatient   03/24/2020 2314 04/09/2020 1424 Full Code [id number]  Mansy, Vernetta Honey, MD ED   Advance Care Planning Activity      TOTAL TIME TAKING CARE OF THIS PATIENT: 28 minutes.    Arnetha Courser  M.D on 08/11/2020 at 10:58 AM  [facility]  CC: Primary care physician; Patient, No Pcp Per

## 2020-08-10 NOTE — Progress Notes (Signed)
Patient ID: Lance Ray, male   DOB: 03-18-1971, 49 y.o.   MRN: [id number] [facility] PROGRESS NOTE  Lance Ray GQQ:[id number] DOB: 02-Jan-1971 DOA: 05/07/2020 PCP: Patient, No Pcp Per  HPI/Subjective: Patient feels okay.  Offers no complaints.  No shortness of breath or chest pain.  Initially admitted with seizures.  Objective: Vitals:   08/10/20 0723 08/10/20 1211  BP: 111/87 117/85  Pulse: 63 77  Resp: 15 16  Temp:  (!) 97.3 F (36.3 C)  SpO2: 96% 97%    Filed Weights   05/29/20 0500 05/30/20 0450 05/31/20 0452  Weight: 66.8 kg 64.3 kg 65.5 kg    ROS: Review of Systems  Respiratory: Negative for shortness of breath.   Cardiovascular: Negative for chest pain.  Gastrointestinal: Negative for abdominal pain, nausea and vomiting.   Exam: Physical Exam HENT:     Head: Normocephalic.     Mouth/Throat:     Pharynx: No oropharyngeal exudate.  Eyes:     General: Lids are normal.     Conjunctiva/sclera: Conjunctivae normal.     Pupils: Pupils are equal, round, and reactive to light.  Cardiovascular:     Rate and Rhythm: Normal rate and regular rhythm.     Heart sounds: Normal heart sounds, S1 normal and S2 normal.  Pulmonary:     Breath sounds: Normal breath sounds. No decreased breath sounds, wheezing or rhonchi.  Abdominal:     Palpations: Abdomen is soft.     Tenderness: There is no abdominal tenderness.  Musculoskeletal:     Right lower leg: No swelling.     Left lower leg: No swelling.  Skin:    General: Skin is warm.     Findings: No rash.  Neurological:     Mental Status: He is alert.     Comments: Answers some yes or no questions.       Data Reviewed: Basic Metabolic Panel: Recent Labs  Lab 08/10/20 0451  CREATININE 1.06   CBC: Recent Labs  Lab 08/10/20 0451  WBC 6.8  HGB 13.9  HCT 40.3  MCV 83.1  PLT 267    Scheduled Meds: . cholecalciferol  1,000 Units Oral Daily  . clonazePAM  0.5 mg Oral BID  . enoxaparin  (LOVENOX) injection  40 mg Subcutaneous Q24H  . haloperidol  0.5 mg Oral BID  . lamoTRIgine  100 mg Oral BID   And  . lamoTRIgine  75 mg Oral BID  . levETIRAcetam  1,500 mg Oral BID  . melatonin  5 mg Oral QHS  . OLANZapine  5 mg Oral QHS  . vitamin B-6  50 mg Oral Daily  . thiamine  100 mg Oral Daily  . vitamin B-12  1,000 mcg Oral Daily    Assessment/Plan:  1. Seizure disorder.  On lamotrigine, Klonopin and Keppra. 2. Impaired insight.  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  Guardianship process still undergoing.  Transitional care team able to get placement for tomorrow at assisted living. 3. Vitamin D deficiency.  Continue oral vitamin D 4. Alcohol abuse.  Continue oral thiamine 5. B12 deficiency continue oral B12 supplementation 6. Assisted living facility wanted a chest x-ray to rule out TB.  Last chest x-ray back in August did not show any signs of tuberculosis.  We will repeat a chest x-ray.  We will also get another Covid test.     Code Status:     Code Status Orders  (From admission, onward)  Start     Ordered   05/07/20 2354  Full code  Continuous        05/07/20 2354        Code Status History    Date Active Date Inactive Code Status Order ID Comments User Context   05/07/2020 2323 05/07/2020 2346 Full Code [id number]  Anselm Jungling, DO Inpatient   04/09/2020 1427 05/07/2020 2323 Full Code [id number]  Audery Amel, MD Inpatient   03/24/2020 2314 04/09/2020 1424 Full Code [id number]  Mansy, Vernetta Honey, MD ED   Advance Care Planning Activity     Family Communication: Transitional care team spoke with patient's sister and they set up the [facility] assisted living for tomorrow Disposition Plan: Status is: Inpatient  Dispo:  Patient From: Home  Planned Disposition: ALF  Expected discharge date: 08/11/20  Medically stable for discharge: Yes  Time spent: 28 minutes, case discussed with transitional care  team.  Lance Ray  [facility]

## 2020-08-10 NOTE — TOC Progression Note (Signed)
Transition of Care [facility]) - Progression Note    Patient Details  Name: Lance Ray MRN: [id number] Date of Birth: 1970-10-27  Transition of Care [facility]) CM/SW Contact  Trenton Founds, RN Phone Number: 08/10/2020, 2:09 PM  Clinical Narrative:   Per Amalia Hailey with [facility] they will be able to accept patient tomorrow. They will need updated Covid and TB test. Attending MD made aware.          Expected Discharge Plan and Services                                                 Social Determinants of Health (SDOH) Interventions    Readmission Risk Interventions Readmission Risk Prevention Plan 03/29/2020  Transportation Screening Complete  HRI or Home Care Consult Complete  Social Work Consult for Recovery Care Planning/Counseling Complete  Palliative Care Screening Not Applicable  Medication Review Oceanographer) Referral to Pharmacy  Some recent data might be hidden

## 2020-08-10 NOTE — TOC Progression Note (Addendum)
Transition of Care [facility]) - Progression Note    Patient Details  Name: Lance Ray MRN: [id number] Date of Birth: 1971-05-04  Transition of Care [facility]) CM/SW [place], RN Phone Number: 08/10/2020, 9:09 AM  Clinical Narrative:   RNCM reached out to patient's sister Levada Dy for update, left message. RNCM noted patient now has Medicaid in effect per [facility] chart.   9:30am- RNCM reached out to [facility] with [facility] to discuss patient, [place] [facility] will come for an evaluation around 10:45. Met with patient at bedside to discuss.   10:00am- RNCM reached out to sister Levada Dy again. This time was able to speak with her. Informed her that per our records his Medicaid is now in effect and that a group home coming to evaluate. Levada Dy reports that she would really like for patient to stay in [facility] and she will not put him in a group home that she does not agree with.          Expected Discharge Plan and Services                                                 Social Determinants of Health (SDOH) Interventions    Readmission Risk Interventions Readmission Risk Prevention Plan 03/29/2020  Transportation Screening Complete  HRI or [place] Complete  Social Work Consult for [place] Planning/Counseling Complete  Palliative Care Screening Not Applicable  Medication Review Press photographer) Referral to Pharmacy  Some recent data might be hidden

## 2020-08-11 NOTE — Progress Notes (Signed)
Called report to [place] at [facility]

## 2020-08-11 NOTE — TOC Progression Note (Signed)
Transition of Care [facility]) - Progression Note    Patient Details  Name: Lance Ray MRN: [id number] Date of Birth: 05-17-1971  Transition of Care [facility]) CM/SW Contact  Lance Founds, RN Phone Number: 08/11/2020, 1:32 PM  Clinical Narrative:     9am- RNCM faxed all necessary information for discharge to [facility] including signed FL-2 and discharge summary. Placed call to [facility] and he reported that patient's sister is planning to come to facility at 62. He reported that if she could not pick the patient up today that he would send transportation around 1pm. RNCM notified both attending MD as well as bedside nurse. RNCM obtained clothing for patient from clothing closet and provided it to him.   12:10pm- RNCM received phone call from patient's sister Lance Ray reporting that she is in the process of working on paperwork and that patient will be able to go tomorrow. Discussed that per Amalia Hailey at facility patient would be able to go today. She states she has to wait on the bonding paperwork from [facility] and this will not be in place until tomorrow. RNCM reached back out to [place] with [facility] and he confirmed that patient would indeed not be able to come until tomorrow and that he was not aware of these issues until the sister came to sign paperwork.          Expected Discharge Plan and Services           Expected Discharge Date: 08/11/20                                     Social Determinants of Health (SDOH) Interventions    Readmission Risk Interventions Readmission Risk Prevention Plan 03/29/2020  Transportation Screening Complete  HRI or Home Care Consult Complete  Social Work Consult for Recovery Care Planning/Counseling Complete  Palliative Care Screening Not Applicable  Medication Review Oceanographer) Referral to Pharmacy  Some recent data might be hidden

## 2020-08-11 NOTE — Plan of Care (Signed)
?  Problem: Education: ?Goal: Expressions of having a comfortable level of knowledge regarding the disease process will increase ?Outcome: Progressing ?  ?Problem: Coping: ?Goal: Ability to adjust to condition or change in health will improve ?Outcome: Progressing ?Goal: Ability to identify appropriate support needs will improve ?Outcome: Progressing ?  ?

## 2020-08-11 NOTE — NC FL2 (Signed)
[place] MEDICAID FL2 LEVEL OF CARE SCREENING TOOL     IDENTIFICATION  Patient Name: Lance Ray Birthdate: [birth year] Sex: male Admission Date (Current Location): 05/07/2020  [facility] and [place] Number:  Chiropodist and Address:  [facility], [address], [place], [place] [address]      Provider Number: [id number]  Attending Physician Name and Address:  Arnetha Courser, MD  Relative Name and Phone Number:  Ephriam Jenkins (Sister) [phone]    Current Level of Care: Hospital Recommended Level of Care: Assisted Living Facility Prior Approval Number:    Date Approved/Denied:   PASRR Number:    Discharge Plan: Other (Comment) (Assisted Living Facility)    Current Diagnoses: Patient Active Problem List   Diagnosis Date Noted  . B12 deficiency   . Alcohol abuse   . Impaired insight   . Vitamin D deficiency   . Hypotension 06/07/2020  . Seizure (HCC) 05/07/2020  . 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 Stuttering   . Depression   . Psychosis (HCC) 04/09/2020  . Failure to thrive in adult 04/09/2020  . Dementia (HCC) 04/09/2020  . Seizure disorder (HCC) 03/25/2020  . Recurrent seizures (HCC) 03/24/2020    Orientation RESPIRATION BLADDER Height & Weight     Self, Time, Situation, Place  Normal Continent Weight: 65.5 kg Height:  5\' 9"  (175.3 cm)  BEHAVIORAL SYMPTOMS/MOOD NEUROLOGICAL BOWEL NUTRITION STATUS  Wanderer   Continent Diet (Regular)  AMBULATORY STATUS COMMUNICATION OF NEEDS Skin   Independent Verbally Normal                       Personal Care Assistance Level of Assistance  Bathing Bathing Assistance: Limited assistance         Functional Limitations Info             SPECIAL CARE FACTORS FREQUENCY                       Contractures Contractures Info: Not present    Additional Factors Info  Code Status, Allergies Code Status Info: Full Code Allergies Info: No known allergies            Current Medications (08/11/2020): Discharge Medications: Medication List    TAKE these medications   clonazePAM 0.5 MG tablet Commonly known as: KLONOPIN Take 1 tablet (0.5 mg total) by mouth 2 (two) times daily.   cyanocobalamin 1000 MCG tablet Take 1 tablet (1,000 mcg total) by mouth daily. Start taking on: August 11, 2020   haloperidol 0.5 MG tablet Commonly known as: HALDOL Take 1 tablet (0.5 mg total) by mouth 2 (two) times daily.   lamoTRIgine 100 MG tablet Commonly known as: LAMICTAL Take 1 tablet (100 mg total) by mouth 2 (two) times daily.   lamoTRIgine 25 MG tablet Commonly known as: LAMICTAL Take 3 tablets (75 mg total) by mouth 2 (two) times daily.   levETIRAcetam ER 1500 MG Tb24 Take 1,500 mg by mouth 2 (two) times daily.   melatonin 5 MG Tabs Take 1 tablet (5 mg total) by mouth at bedtime.   OLANZapine 5 MG tablet Commonly known as: ZYPREXA Take 1 tablet (5 mg total) by mouth at bedtime.   pyridOXINE 50 MG tablet Commonly known as: B-6 Take 1 tablet (50 mg total) by mouth daily. Start taking on: August 11, 2020   thiamine 100 MG tablet Take 1 tablet (100 mg total) by mouth daily. Start taking  on: August 11, 2020   Vitamin D3 25 MCG tablet Commonly known as: Vitamin D Take 1 tablet (1,000 Units total) by mouth daily. Start taking on: August 11, 2020    Relevant Imaging Results:  Relevant Lab Results:   Additional Information    Trenton Founds, RN

## 2020-08-12 NOTE — Progress Notes (Signed)
RN attempted to call report to [facility] for discharge at 1400

## 2020-08-12 NOTE — Plan of Care (Signed)
  Problem: Coping: Goal: Ability to adjust to condition or change in health will improve Outcome: Progressing Goal: Ability to identify appropriate support needs will improve Outcome: Progressing   Problem: Health Behavior/Discharge Planning: Goal: Compliance with prescribed medication regimen will improve Outcome: Progressing   Problem: Medication: Goal: Risk for medication side effects will decrease Outcome: Progressing

## 2020-08-12 NOTE — Progress Notes (Signed)
Discharge packet given to drive of [facility] facility.  Report was called the previous day, this RN called an update.  No s/s of distress.  VSS.  Pt transported to facility via [facility] transportation.

## 2021-05-19 ENCOUNTER — Encounter: Payer: Self-pay | Admitting: Emergency Medicine

## 2021-05-19 ENCOUNTER — Other Ambulatory Visit: Payer: Self-pay

## 2021-05-19 ENCOUNTER — Emergency Department: Payer: Medicaid Other

## 2021-05-19 ENCOUNTER — Emergency Department
Admission: EM | Admit: 2021-05-19 | Discharge: 2021-05-19 | Disposition: A | Discharge status: Home / Self Care | Payer: Medicaid Other | Attending: Emergency Medicine | Admitting: Emergency Medicine

## 2021-05-19 DIAGNOSIS — F17200 Nicotine dependence, unspecified, uncomplicated: Secondary | ICD-10-CM | POA: Diagnosis not present

## 2021-05-19 DIAGNOSIS — G40909 Epilepsy, unspecified, not intractable, without status epilepticus: Secondary | ICD-10-CM | POA: Insufficient documentation

## 2021-05-19 DIAGNOSIS — Z79899 Other long term (current) drug therapy: Secondary | ICD-10-CM | POA: Insufficient documentation

## 2021-05-19 DIAGNOSIS — R569 Unspecified convulsions: Secondary | ICD-10-CM

## 2021-05-19 DIAGNOSIS — F0390 Unspecified dementia without behavioral disturbance: Secondary | ICD-10-CM | POA: Insufficient documentation

## 2021-05-19 LAB — BASIC METABOLIC PANEL
Anion gap: 16 — ABNORMAL HIGH (ref 5–15)
BUN: 12 mg/dL (ref 6–20)
CO2: 17 mmol/L — ABNORMAL LOW (ref 22–32)
Calcium: 9.2 mg/dL (ref 8.9–10.3)
Chloride: 104 mmol/L (ref 98–111)
Creatinine, Ser: 1.34 mg/dL — ABNORMAL HIGH (ref 0.61–1.24)
GFR, Estimated: >60 mL/min (ref >60)
Glucose, Bld: 132 mg/dL — ABNORMAL HIGH (ref 70–99)
Potassium: 4.4 mmol/L (ref 3.5–5.1)
Sodium: 137 mmol/L (ref 135–145)

## 2021-05-19 LAB — CBC
HCT: 44.7 % (ref 39.0–52.0)
Hemoglobin: 15.3 g/dL (ref 13.0–17.0)
MCH: 29.5 pg (ref 26.0–34.0)
MCHC: 34.2 g/dL (ref 30.0–36.0)
MCV: 86.3 fL (ref 80.0–100.0)
Platelets: 336 10*3/uL (ref 150–400)
RBC: 5.18 MIL/uL (ref 4.22–5.81)
RDW: 13.2 % (ref 11.5–15.5)
WBC: 6.7 10*3/uL (ref 4.0–10.5)
nRBC: 0 % (ref 0.0–0.2)

## 2021-05-19 NOTE — ED Provider Notes (Signed)
[facility] Emergency Department Provider Note   ____________________________________________    I have reviewed the triage vital signs and the nursing notes.   HISTORY  Chief Complaint Seizures     HPI Lance Ray is a 50 y.o. male with a history of seizures and as listed below who presents after a seizure.  Apparently the patient had generalized tonic-clonic seizure.  Overall he feels well and has no complaints at this time.  He admits to missing the dose of his evening seizure medication yesterday.  He is post to take 1500 mg of Keppra twice daily.  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 Authorizing Provider  cholecalciferol (VITAMIN D) 25 MCG tablet Take 1 tablet (1,000 Units total) by mouth daily. 08/11/20   [name], MD  clonazePAM (KLONOPIN) 0.5 MG tablet Take 1 tablet (0.5 mg total) by mouth 2 (two) times daily. 08/10/20   [name], MD  haloperidol (HALDOL) 0.5 MG tablet Take 1 tablet (0.5 mg total) by mouth 2 (two) times daily. 08/10/20   [name], MD  lamoTRIgine (LAMICTAL) 100 MG tablet Take 1 tablet (100 mg total) by mouth 2 (two) times daily. 08/10/20   [name], MD  lamoTRIgine (LAMICTAL) 25 MG tablet Take 3 tablets (75 mg total) by mouth 2 (two) times daily. 08/10/20   [name], MD  levETIRAcetam 1500 MG TB24 Take 1,500 mg by mouth 2 (two) times daily. 08/10/20   [name], MD  melatonin 5 MG TABS Take 1 tablet (5 mg total) by mouth at bedtime. 08/10/20   [name], MD  OLANZapine (ZYPREXA) 5 MG tablet Take 1 tablet (5 mg total) by mouth at bedtime. 08/10/20   [name], MD  pyridOXINE (B-6) 50 MG tablet Take 1 tablet (50 mg total) by mouth daily. 08/11/20   [name], MD  thiamine 100 MG tablet Take 1 tablet (100 mg total) by mouth daily. 08/11/20   [name], MD  vitamin B-12 1000 MCG tablet Take 1 tablet (1,000 mcg total) by mouth daily. 08/11/20   [name], MD     Allergies Patient has no known allergies.  Family History  Problem Relation Age of Onset   Seizures Mother    Cerebral aneurysm Father     Social History Social History   Tobacco Use   Smoking status: Some Days   Smokeless tobacco: Current  Vaping Use   Vaping Use: Never used  Substance Use Topics   Alcohol use: Yes    Alcohol/week: 2.0 standard drinks    Types: 2 Shots of liquor per week    Comment: "rarely"    Drug use: No    Review of Systems  Constitutional: No fever/chills Eyes:  No visual changes.  ENT: No sore throat. Cardiovascular: Denies chest pain. Respiratory: Denies shortness of breath. Gastrointestinal: No abdominal pain.  No nausea, no vomiting.   Genitourinary: Negative for dysuria. Musculoskeletal: Negative for back pain. Skin: Negative for rash. Neurological: Negative for headaches or weakness   ____________________________________________   PHYSICAL EXAM:  VITAL SIGNS: ED Triage Vitals  Enc Vitals Group     BP 05/19/21 1200 (!) 131/100     Pulse Rate 05/19/21 1200 (!) 127     Resp 05/19/21 1200 18     Temp 05/19/21 1200 97.9 F (36.6 C)     Temp Source 05/19/21 1200 Oral     SpO2 05/19/21 1157 96 %     Weight 05/19/21 1200 75.2 kg (165 lb 11.2 oz)     Height 05/19/21 1200 1.753 m (5\' 9" )      Head Circumference --      Peak Flow --      Pain Score 05/19/21 1202 0     Pain Loc --      Pain Edu? --      Excl. in GC? --     Constitutional: Alert and oriented.   Nose: No congestion/rhinnorhea. Mouth/Throat: Mucous membranes are moist.  No tongue injury Neck:  Painless ROM Cardiovascular: Normal rate, regular rhythm. Grossly normal heart sounds.  Good peripheral circulation. Respiratory: Normal respiratory effort.  No retractions. Lungs CTAB. Gastrointestinal: Soft and nontender. No distention.   Musculoskeletal: No lower extremity tenderness nor edema.  Warm and well perfused Neurologic:  Normal speech and language. No gross focal neurologic deficits are appreciated.  Skin:  Skin is warm, dry and intact. No rash noted. Psychiatric: Mood and affect are normal. Speech and behavior are normal.  ____________________________________________   LABS (all labs ordered are listed, but only abnormal results are displayed)  Labs Reviewed  BASIC METABOLIC PANEL - Abnormal; Notable for the following components:      Result Value   CO2 17 (*)    Glucose, Bld 132 (*)    Creatinine, Ser 1.34 (*)    Anion gap 16 (*)    All other components within normal limits  CBC   ____________________________________________  EKG  ED ECG REPORT I, 05/21/21, the attending physician, personally viewed and interpreted this ECG.  Date: 05/19/2021  Rhythm: Sinus tachycardia QRS Axis: normal Intervals: normal ST/T Wave abnormalities: normal Narrative Interpretation: no evidence of acute ischemia  ____________________________________________  RADIOLOGY  CT head reviewed by me, no acute abnormality ____________________________________________   PROCEDURES  Procedure(s) performed: No  Procedures   Critical Care performed: No ____________________________________________   INITIAL IMPRESSION / ASSESSMENT AND PLAN / ED COURSE  Pertinent labs & imaging results that were  available during my care of the patient were reviewed by me and considered in my medical decision making (see chart for details).   Patient well-appearing in no acute distress.  Lab work today is overall unremarkable, CO2 of 17 consistent with recent seizure.  Obtain CT because of reports of head injury.  No evidence of intracranial bleeding  Emphasized to patient the need to take seizure medication at the same time every day.  Observed in the emergency department, no further seizures, proper for discharge at this time.    ____________________________________________   FINAL CLINICAL IMPRESSION(S) / ED DIAGNOSES  Final diagnoses:  Seizure (HCC)        Note:  This document was prepared using Dragon voice recognition software and may include unintentional dictation errors.  Jene Every, MD 05/19/21 1452

## 2021-05-19 NOTE — ED Notes (Signed)
Patient's sister called x3 with no answer. Message left on voicemail to see if she can give patient ride home.

## 2021-05-19 NOTE — Discharge Instructions (Addendum)
Be sure to take your seizure medications

## 2021-05-19 NOTE — ED Notes (Signed)
EKG completed and provider reviewed at 1211

## 2021-05-19 NOTE — ED Notes (Signed)
Report given to [place], [employer]. Tech stated that there was not RN there.

## 2021-05-19 NOTE — ED Notes (Signed)
Patient walked out front to transport from [facility].

## 2021-05-19 NOTE — ED Triage Notes (Signed)
Patient to ED via [facility] from [facility] [place] for seizures. Patient has history of same. Patient has bump on back of head but denies blood thinners.

## 2022-02-01 ENCOUNTER — Other Ambulatory Visit: Payer: Self-pay | Admitting: Neurology

## 2022-02-01 DIAGNOSIS — R490 Dysphonia: Secondary | ICD-10-CM

## 2022-02-01 DIAGNOSIS — R479 Unspecified speech disturbances: Secondary | ICD-10-CM

## 2022-02-01 DIAGNOSIS — R569 Unspecified convulsions: Secondary | ICD-10-CM

## 2022-02-14 ENCOUNTER — Ambulatory Visit
Admission: RE | Admit: 2022-02-14 | Discharge: 2022-02-14 | Disposition: A | Discharge status: Home / Self Care | Payer: Medicaid Other | Source: Ambulatory Visit | Attending: Neurology | Admitting: Neurology

## 2022-02-14 DIAGNOSIS — R490 Dysphonia: Secondary | ICD-10-CM | POA: Insufficient documentation

## 2022-02-14 DIAGNOSIS — R569 Unspecified convulsions: Secondary | ICD-10-CM | POA: Insufficient documentation

## 2022-02-14 DIAGNOSIS — R479 Unspecified speech disturbances: Secondary | ICD-10-CM | POA: Diagnosis present

## 2023-12-17 ENCOUNTER — Other Ambulatory Visit: Payer: Self-pay

## 2023-12-17 ENCOUNTER — Emergency Department

## 2023-12-17 ENCOUNTER — Emergency Department
Admission: EM | Admit: 2023-12-17 | Discharge: 2023-12-17 | Disposition: A | Discharge status: Home / Self Care | Attending: Emergency Medicine | Admitting: Emergency Medicine

## 2023-12-17 DIAGNOSIS — S0990XA Unspecified injury of head, initial encounter: Secondary | ICD-10-CM | POA: Insufficient documentation

## 2023-12-17 DIAGNOSIS — W06XXXA Fall from bed, initial encounter: Secondary | ICD-10-CM | POA: Diagnosis not present

## 2023-12-17 DIAGNOSIS — S0181XA Laceration without foreign body of other part of head, initial encounter: Secondary | ICD-10-CM | POA: Diagnosis present

## 2023-12-17 LAB — BASIC METABOLIC PANEL
Anion gap: 9 (ref 5–15)
BUN: 12 mg/dL (ref 6–20)
CO2: 25 mmol/L (ref 22–32)
Calcium: 9.2 mg/dL (ref 8.9–10.3)
Chloride: 104 mmol/L (ref 98–111)
Creatinine, Ser: 1.21 mg/dL (ref 0.61–1.24)
GFR, Estimated: >60 mL/min (ref >60)
Glucose, Bld: 93 mg/dL (ref 70–99)
Potassium: 4.3 mmol/L (ref 3.5–5.1)
Sodium: 138 mmol/L (ref 135–145)

## 2023-12-17 LAB — CBC
HCT: 46.8 % (ref 39.0–52.0)
Hemoglobin: 15.4 g/dL (ref 13.0–17.0)
MCH: 28.4 pg (ref 26.0–34.0)
MCHC: 32.9 g/dL (ref 30.0–36.0)
MCV: 86.3 fL (ref 80.0–100.0)
Platelets: 263 10*3/uL (ref 150–400)
RBC: 5.42 MIL/uL (ref 4.22–5.81)
RDW: 13.1 % (ref 11.5–15.5)
WBC: 5.8 10*3/uL (ref 4.0–10.5)
nRBC: 0 % (ref 0.0–0.2)

## 2023-12-17 NOTE — ED Notes (Signed)
 [facility]  CALLED  FOR  TRANSPORT   TO  THE  [facility]

## 2023-12-17 NOTE — ED Provider Notes (Signed)
 [facility] Provider Note    Event Date/Time   First MD Initiated Contact with Patient 12/17/23 1402     (approximate)   History   Fall   HPI  Lance Ray is a 53 y.o. male history of seizure disorder, imbalance, depression, dementia  Patient fell out of bed this morning.  Reports that he was getting up he sort of rolled out of bed he struck his left forehead on the edge of his small refrigerator.  It was bleeding.  EMS was called.  Did not lose consciousness.  Reports that he has poor balance.  He suffered poor balance for some time now, saw his neurologist for this about 3 weeks ago.  He typically uses a cane.  Today he reports he just sort of lost his balance as he was getting up and fell out of the bed  No neck pain no numbness or weakness.  Has been in his normal state of health, saw his neurologist about 3 weeks ago     Physical Exam   Triage Vital Signs: ED Triage Vitals  Encounter Vitals Group     BP 12/17/23 1120 (!) 125/93     Systolic BP Percentile --      Diastolic BP Percentile --      Pulse Rate 12/17/23 1120 92     Resp 12/17/23 1120 18     Temp 12/17/23 1120 98 F (36.7 C)     Temp Source 12/17/23 1120 Oral     SpO2 12/17/23 1120 96 %     Weight 12/17/23 1123 168 lb (76.2 kg)     Height 12/17/23 1123 5\' 9"  (1.753 m)     Head Circumference --      Peak Flow --      Pain Score 12/17/23 1123 0     Pain Loc --      Pain Education --      Exclude from Growth Chart --     Most recent vital signs: Vitals:   12/17/23 1120  BP: (!) 125/93  Pulse: 92  Resp: 18  Temp: 98 F (36.7 C)  SpO2: 96%     General: Awake, no distress.  Normocephalic atraumatic with exception to a small very superficial laceration with no associated foreign bodies or bleeding at this time just above the left medial superior orbit.  Please see media uploaded No cervical tenderness.  Full range of motion of neck without pain or discomfort.  Moves  all extremities well.  No noted weakness or numbness in the extremities.  Patient reports suffers poor coordination typically walks with a cane CV:  Good peripheral perfusion.  Normal tones and rate Resp:  Normal effort.  Clear bilateral normal work of breathing Abd:  No distention.  Other:     ED Results / Procedures / Treatments   Labs (all labs ordered are listed, but only abnormal results are displayed) Labs Reviewed  BASIC METABOLIC PANEL  CBC  LEVETIRACETAM LEVEL     EKG  And inter by me at 1130 heart rate 90 QRS 85 QTc 430 Normal sinus rhythm no evidence of acute ischemia   RADIOLOGY CT head interpreted by me as grossly negative for acute intracranial hemorrhage    PROCEDURES:  Critical Care performed: No  .Laceration Repair  Date/Time: 12/17/2023 3:05 PM  Performed by: Sharyn Creamer, MD Authorized by: Sharyn Creamer, MD   Consent:    Consent obtained:  Verbal   Consent given by:  Patient   Risks discussed:  Infection and poor wound healing   Alternatives discussed:  No treatment Universal protocol:    Procedure explained and questions answered to patient or proxy's satisfaction: yes     Imaging studies available: yes     Patient identity confirmed:  Verbally with patient Laceration details:    Length (cm):  1.5   Depth (mm):  3 Pre-procedure details:    Preparation:  Imaging obtained to evaluate for foreign bodies Exploration:    Limited defect created (wound extended): no   Treatment:    Area cleansed with:  Saline and soap and water   Amount of cleaning:  Standard Skin repair:    Repair method:  Tissue adhesive Approximation:    Approximation:  Close Repair type:    Repair type:  Simple Post-procedure details:    Procedure completion:  Tolerated well, no immediate complications    MEDICATIONS ORDERED IN ED: Medications - No data to display   IMPRESSION / MDM / ASSESSMENT AND PLAN / ED COURSE  I reviewed the triage vital signs and the  nursing notes.                              Differential diagnosis includes, but is not limited to, injury suffered from fall, patient has a history of debility and imbalance issues ongoing.  He reports significant balance problems, this morning when he is getting out of bed he fell.  He is awake alert oriented at this time.  Does carry history of dementia but is well oriented able to tell me he went to the neurologist 3 weeks ago falls frequently.  He is very pleasant laceration above the left eyebrow repaired with good effect.  Cleansed irrigated.  No complicating factors no skull fracture.    Patient denies any pain or burning with urination.  Reports that he has been having imbalance issues for extensive period of time.  Seen recent by neurology  Patient's presentation is most consistent with acute complicated illness / injury requiring diagnostic workup.   Discussed with the patient, he reports he has a cane at the house but has previously used all crutch knee reports the use of the crutch actually has been very helpful.  He would like 1 crutch to have not because he having pain in his legs or anything but because it is a good balancing tool for him in the past.  I did call and left a message with DSS, Christiane Ha, without return call back as of 3:05 PM.  Patient is listed as having DSS guardianship  Return precautions and treatment recommendations and follow-up discussed with the patient who is agreeable with the plan. Attempted to reach guardian.  Patient advised that [place] should be able to send a vehicle to bring him home.  Await transport back to his care facility   Return precautions and treatment recommendations and follow-up discussed with the patient who is agreeable with the plan.        FINAL CLINICAL IMPRESSION(S) / ED DIAGNOSES   Final diagnoses:  Facial laceration, initial encounter  Closed head injury, initial encounter     Rx / DC Orders   ED Discharge  Orders     None        Note:  This document was prepared using Dragon voice recognition software and may include unintentional dictation errors.   Sharyn Creamer, MD 12/17/23 [phone]

## 2023-12-17 NOTE — ED Triage Notes (Signed)
 Pt presents to the ED via [facility] from [facility] [place]. No LOC. No use of blood thinners. Pt does have an abnormal gate per EMT-P. Pt states that this has been going on for about three days.   CBG 81 BP 142/70 98% on RA HR 88 NSR

## 2023-12-20 ENCOUNTER — Encounter: Payer: Self-pay | Admitting: Emergency Medicine

## 2023-12-20 ENCOUNTER — Emergency Department

## 2023-12-20 ENCOUNTER — Emergency Department
Admission: EM | Admit: 2023-12-20 | Discharge: 2023-12-20 | Disposition: A | Discharge status: Home / Self Care | Attending: Emergency Medicine | Admitting: Emergency Medicine

## 2023-12-20 DIAGNOSIS — S0990XA Unspecified injury of head, initial encounter: Secondary | ICD-10-CM | POA: Insufficient documentation

## 2023-12-20 DIAGNOSIS — W010XXA Fall on same level from slipping, tripping and stumbling without subsequent striking against object, initial encounter: Secondary | ICD-10-CM | POA: Insufficient documentation

## 2023-12-20 DIAGNOSIS — S022XXA Fracture of nasal bones, initial encounter for closed fracture: Secondary | ICD-10-CM | POA: Diagnosis not present

## 2023-12-20 DIAGNOSIS — W19XXXA Unspecified fall, initial encounter: Secondary | ICD-10-CM

## 2023-12-20 DIAGNOSIS — F0390 Unspecified dementia without behavioral disturbance: Secondary | ICD-10-CM | POA: Insufficient documentation

## 2023-12-20 LAB — CBG MONITORING, ED: Glucose-Capillary: 89 mg/dL (ref 70–99)

## 2023-12-20 NOTE — ED Notes (Signed)
 This RN gave notified [facility] of discharge and gave report at this time. Staff at [facility] stated they had transport but they were unavailable at this time. They stated that their transport came in at 0630-0700 this morning. RN gave phone number for staff to call back about transporting pt back to facility.

## 2023-12-20 NOTE — ED Notes (Signed)
 [facility] transport called this RN and stated that they were on the way to pick up pt at this time.

## 2023-12-20 NOTE — ED Triage Notes (Addendum)
 Pt arrived via [facility] from [facility] [place] [facility] unwitnessed fall. Report of frequent falls with multiple EMS calls recently to facility for same. Pt arrived A&O x4, dry blood around nares, multi-staging bruising and swelling around bilateral eyes. No blood thinners, did hit his head, denies LOC. Pt reports feeling dizzy prior to this mornings fall.

## 2023-12-20 NOTE — Discharge Instructions (Addendum)
 You broke your nose.  Take tylenol 650mg  every 6 hours for pain.  Use ice to help with pain and swelling.  Thank you for choosing [facility] for your health care today!  Please see your primary doctor this week for a follow up appointment.   If you have any new, worsening, or unexpected symptoms call your doctor right away or come back to the emergency department for reevaluation.  It was my pleasure to care for you today.   Daneil Dan Modesto Charon, MD

## 2023-12-20 NOTE — ED Provider Notes (Signed)
 [facility] Provider Note    Event Date/Time   First MD Initiated Contact with Patient 12/20/23 0423     (approximate)   History   Fall   HPI  Lance Ray is a 53 y.o. male   Past medical history of seizure disorder, dementia, psychosis, alcohol use, imbalance, here with a fall.  He has had multiple falls recently.  He states that in the context of his recent falls he was trying to be extra careful today.  He fell asleep in his chair and when he awoke in the middle of the night he decided to get himself to his bed.  To be extra careful and avoid a fall he decided to navigate his way from his chair to his bed by crawling on his hands and knees instead of walking.  He did this electively.  He did not have any weakness, sensory changes, dizziness.  By the time he got to his bed his 2 hand slipped out from underneath him and he fell face forward striking his nose on the ground.  He had a bloody nose.  He did not lose consciousness.  He did not injure anywhere else.  He denies any acute medical complaints otherwise.  He denies any nausea vomiting or diarrhea, acute pain, respiratory infectious complaints, urinary complaints recently.  He denies any chest pain.  Independent Historian contributed to assessment above: EMS gives report, and the person who transported him today also transported him for his fall recently earlier this week and noted the bruising to his face currently was present upon discharge last time  External Medical Documents Reviewed: Emergency department visit dated 12/17/2023 with a fall negative head imaging and ultimately discharged      Physical Exam   Triage Vital Signs: ED Triage Vitals [12/20/23 0420]  Encounter Vitals Group     BP      Systolic BP Percentile      Diastolic BP Percentile      Pulse      Resp      Temp      Temp src      SpO2      Weight 167 lb 15.9 oz (76.2 kg)     Height 5\' 9"  (1.753 m)     Head  Circumference      Peak Flow      Pain Score      Pain Loc      Pain Education      Exclude from Growth Chart     Most recent vital signs: Vitals:   12/20/23 0438  BP: 122/89  Pulse: 100  Resp: 20  Temp: (!) 97.4 F (36.3 C)  SpO2: 100%    General: Awake, no distress.  CV:  Good peripheral perfusion.  Resp:  Normal effort.  Abd:  No distention.  Other:  He has bruising throughout his face.  He has bruising underneath his eyes.  He has a swollen nose that is tender to palpation along the bony prominences of the nasal bridge.  He has dried blood in bilateral nares but no septal hematoma.  He has no other facial bony tenderness to palpation and no malocclusion.  His neck is supple with full range of motion.  Thorax, abdomen, T and L-spine palpated and does not elicit pain.  He is able to range fully at all upper and lower extremity joints.   ED Results / Procedures / Treatments   Labs (all labs ordered are  listed, but only abnormal results are displayed) Labs Reviewed  CBG MONITORING, ED     I ordered and reviewed the above labs they are notable for glucose within normal  EKG  ED ECG REPORT I, Pilar Jarvis, the attending physician, personally viewed and interpreted this ECG.   Date: 12/20/2023  EKG Time: 0435  Rate: 97  Rhythm: sinus  Axis: nl  Intervals:none  ST&T Change: no stemi    RADIOLOGY I independently reviewed and interpreted CT head and see no obvious bleeding or midline shift I also reviewed radiologist's formal read.   PROCEDURES:  Critical Care performed: No  Procedures   MEDICATIONS ORDERED IN ED: Medications - No data to display   IMPRESSION / MDM / ASSESSMENT AND PLAN / ED COURSE  I reviewed the triage vital signs and the nursing notes.                                Patient's presentation is most consistent with acute presentation with potential threat to life or bodily function.  Differential diagnosis includes, but is not  limited to, blunt traumatic injury to the head/neck/face including facial bone fractures, nasal bone fracture, intracranial bleeding, C-spine injury   The patient is on the cardiac monitor to evaluate for evidence of arrhythmia and/or significant heart rate changes.  MDM:    This was a mechanical slip and fall that led to facial trauma.  No other noted trauma on my physical exam nor reported by patient and no other acute medical complaints.  Given his injury to his face I think it prudent to check a head neck and facial bone CT.  If these are unremarkable plan will be for discharge home.       FINAL CLINICAL IMPRESSION(S) / ED DIAGNOSES   Final diagnoses:  None     Rx / DC Orders   ED Discharge Orders     None        Note:  This document was prepared using Dragon voice recognition software and may include unintentional dictation errors.    Pilar Jarvis, MD 12/20/23 [phone]

## 2023-12-20 NOTE — ED Notes (Signed)
 Fall interventions in place include: fall bracelet, fall alarm, non-slip socks, bed in lowest position, bed close and within eye-sight of nurses station
